# Patient Record
Sex: Male | Born: 1960 | Race: White | Hispanic: No | Marital: Married | Smoking: Never smoker
Health system: Southern US, Community
[De-identification: ages and names within clinical notes are randomized; demographics above are authoritative.]

## PROBLEM LIST (undated history)

## (undated) DIAGNOSIS — R55 Syncope and collapse: Secondary | ICD-10-CM

## (undated) DIAGNOSIS — I471 Supraventricular tachycardia, unspecified: Secondary | ICD-10-CM

## (undated) DIAGNOSIS — H9190 Unspecified hearing loss, unspecified ear: Secondary | ICD-10-CM

## (undated) DIAGNOSIS — K2270 Barrett's esophagus without dysplasia: Secondary | ICD-10-CM

## (undated) DIAGNOSIS — J302 Other seasonal allergic rhinitis: Secondary | ICD-10-CM

## (undated) DIAGNOSIS — K219 Gastro-esophageal reflux disease without esophagitis: Secondary | ICD-10-CM

## (undated) DIAGNOSIS — R042 Hemoptysis: Principal | ICD-10-CM

## (undated) DIAGNOSIS — M94262 Chondromalacia, left knee: Secondary | ICD-10-CM

## (undated) DIAGNOSIS — R12 Heartburn: Secondary | ICD-10-CM

## (undated) DIAGNOSIS — R079 Chest pain, unspecified: Secondary | ICD-10-CM

## (undated) DIAGNOSIS — G459 Transient cerebral ischemic attack, unspecified: Secondary | ICD-10-CM

## (undated) DIAGNOSIS — R002 Palpitations: Secondary | ICD-10-CM

## (undated) DIAGNOSIS — M1990 Unspecified osteoarthritis, unspecified site: Secondary | ICD-10-CM

## (undated) DIAGNOSIS — E7800 Pure hypercholesterolemia, unspecified: Secondary | ICD-10-CM

## (undated) DIAGNOSIS — I639 Cerebral infarction, unspecified: Secondary | ICD-10-CM

## (undated) DIAGNOSIS — T7840XA Allergy, unspecified, initial encounter: Secondary | ICD-10-CM

## (undated) HISTORY — DX: Supraventricular tachycardia: I47.1

## (undated) HISTORY — DX: Chest pain, unspecified: R07.9

## (undated) HISTORY — DX: Cerebral infarction, unspecified: I63.9

## (undated) HISTORY — DX: Palpitations: R00.2

## (undated) HISTORY — DX: Gastro-esophageal reflux disease without esophagitis: K21.9

## (undated) HISTORY — DX: Allergy, unspecified, initial encounter: T78.40XA

## (undated) HISTORY — PX: KNEE ARTHROSCOPY: SHX127

## (undated) HISTORY — DX: Unspecified osteoarthritis, unspecified site: M19.90

## (undated) HISTORY — DX: Hemoptysis: R04.2

## (undated) HISTORY — DX: Supraventricular tachycardia, unspecified: I47.10

## (undated) HISTORY — PX: TRANSTHORACIC ECHOCARDIOGRAM: SHX275

## (undated) HISTORY — DX: Barrett's esophagus without dysplasia: K22.70

## (undated) HISTORY — PX: INGUINAL HERNIA REPAIR: SHX194

---

## 2000-11-14 ENCOUNTER — Emergency Department (HOSPITAL_COMMUNITY): Admission: EM | Admit: 2000-11-14 | Discharge: 2000-11-14 | Payer: Self-pay | Admitting: Emergency Medicine

## 2001-06-29 ENCOUNTER — Emergency Department (HOSPITAL_COMMUNITY): Admission: EM | Admit: 2001-06-29 | Discharge: 2001-06-29 | Payer: Self-pay | Admitting: Emergency Medicine

## 2001-07-08 ENCOUNTER — Emergency Department (HOSPITAL_COMMUNITY): Admission: EM | Admit: 2001-07-08 | Discharge: 2001-07-09 | Payer: Self-pay | Admitting: Emergency Medicine

## 2003-08-09 ENCOUNTER — Emergency Department (HOSPITAL_COMMUNITY): Admission: EM | Admit: 2003-08-09 | Discharge: 2003-08-09 | Payer: Self-pay

## 2004-05-29 ENCOUNTER — Emergency Department (HOSPITAL_COMMUNITY): Admission: EM | Admit: 2004-05-29 | Discharge: 2004-05-30 | Payer: Self-pay | Admitting: Emergency Medicine

## 2004-10-28 ENCOUNTER — Emergency Department (HOSPITAL_COMMUNITY): Admission: EM | Admit: 2004-10-28 | Discharge: 2004-10-28 | Payer: Self-pay | Admitting: Emergency Medicine

## 2005-05-02 ENCOUNTER — Emergency Department (HOSPITAL_COMMUNITY): Admission: EM | Admit: 2005-05-02 | Discharge: 2005-05-02 | Payer: Self-pay | Admitting: Emergency Medicine

## 2005-11-29 ENCOUNTER — Inpatient Hospital Stay (HOSPITAL_COMMUNITY): Admission: EM | Admit: 2005-11-29 | Discharge: 2005-11-30 | Payer: Self-pay | Admitting: Emergency Medicine

## 2006-04-09 ENCOUNTER — Emergency Department (HOSPITAL_COMMUNITY): Admission: EM | Admit: 2006-04-09 | Discharge: 2006-04-09 | Payer: Self-pay | Admitting: Emergency Medicine

## 2006-06-05 ENCOUNTER — Emergency Department (HOSPITAL_COMMUNITY): Admission: EM | Admit: 2006-06-05 | Discharge: 2006-06-05 | Payer: Self-pay | Admitting: Emergency Medicine

## 2006-09-04 ENCOUNTER — Observation Stay (HOSPITAL_COMMUNITY): Admission: EM | Admit: 2006-09-04 | Discharge: 2006-09-04 | Payer: Self-pay | Admitting: Emergency Medicine

## 2006-11-12 ENCOUNTER — Ambulatory Visit: Payer: Self-pay | Admitting: Gastroenterology

## 2006-11-26 ENCOUNTER — Encounter (INDEPENDENT_AMBULATORY_CARE_PROVIDER_SITE_OTHER): Payer: Self-pay | Admitting: Specialist

## 2006-11-26 ENCOUNTER — Ambulatory Visit: Payer: Self-pay | Admitting: Gastroenterology

## 2006-12-08 ENCOUNTER — Ambulatory Visit: Payer: Self-pay | Admitting: Gastroenterology

## 2007-10-29 ENCOUNTER — Ambulatory Visit: Payer: Self-pay | Admitting: Gastroenterology

## 2007-11-10 ENCOUNTER — Encounter: Payer: Self-pay | Admitting: Gastroenterology

## 2007-11-10 ENCOUNTER — Ambulatory Visit: Payer: Self-pay | Admitting: Gastroenterology

## 2007-11-10 ENCOUNTER — Encounter (INDEPENDENT_AMBULATORY_CARE_PROVIDER_SITE_OTHER): Payer: Self-pay | Admitting: *Deleted

## 2008-02-22 ENCOUNTER — Emergency Department (HOSPITAL_COMMUNITY): Admission: EM | Admit: 2008-02-22 | Discharge: 2008-02-22 | Payer: Self-pay | Admitting: Emergency Medicine

## 2008-05-08 ENCOUNTER — Emergency Department (HOSPITAL_COMMUNITY): Admission: EM | Admit: 2008-05-08 | Discharge: 2008-05-08 | Payer: Self-pay | Admitting: Emergency Medicine

## 2009-10-03 ENCOUNTER — Encounter (INDEPENDENT_AMBULATORY_CARE_PROVIDER_SITE_OTHER): Payer: Self-pay | Admitting: *Deleted

## 2009-10-17 ENCOUNTER — Encounter (INDEPENDENT_AMBULATORY_CARE_PROVIDER_SITE_OTHER): Payer: Self-pay | Admitting: *Deleted

## 2009-10-17 ENCOUNTER — Ambulatory Visit: Payer: Self-pay | Admitting: Gastroenterology

## 2009-10-17 DIAGNOSIS — R1013 Epigastric pain: Secondary | ICD-10-CM

## 2009-10-17 DIAGNOSIS — R12 Heartburn: Secondary | ICD-10-CM

## 2009-10-23 ENCOUNTER — Encounter: Payer: Self-pay | Admitting: Gastroenterology

## 2009-10-23 ENCOUNTER — Ambulatory Visit: Payer: Self-pay | Admitting: Gastroenterology

## 2009-10-25 ENCOUNTER — Encounter: Payer: Self-pay | Admitting: Gastroenterology

## 2010-06-24 ENCOUNTER — Emergency Department (HOSPITAL_COMMUNITY): Admission: EM | Admit: 2010-06-24 | Discharge: 2010-06-24 | Payer: Self-pay | Admitting: Emergency Medicine

## 2010-10-04 ENCOUNTER — Emergency Department (HOSPITAL_COMMUNITY)
Admission: EM | Admit: 2010-10-04 | Discharge: 2010-10-04 | Payer: Self-pay | Source: Home / Self Care | Admitting: Emergency Medicine

## 2010-12-30 HISTORY — PX: BUNIONECTOMY: SHX129

## 2010-12-30 HISTORY — PX: OTHER SURGICAL HISTORY: SHX169

## 2011-01-20 ENCOUNTER — Encounter: Payer: Self-pay | Admitting: Gastroenterology

## 2011-02-26 ENCOUNTER — Emergency Department (HOSPITAL_BASED_OUTPATIENT_CLINIC_OR_DEPARTMENT_OTHER)
Admission: EM | Admit: 2011-02-26 | Discharge: 2011-02-26 | Disposition: A | Discharge status: Home / Self Care | Payer: Medicare Other | Attending: Emergency Medicine | Admitting: Emergency Medicine

## 2011-02-26 ENCOUNTER — Emergency Department (INDEPENDENT_AMBULATORY_CARE_PROVIDER_SITE_OTHER): Payer: Medicare Other

## 2011-02-26 DIAGNOSIS — J45909 Unspecified asthma, uncomplicated: Secondary | ICD-10-CM | POA: Insufficient documentation

## 2011-02-26 DIAGNOSIS — H9190 Unspecified hearing loss, unspecified ear: Secondary | ICD-10-CM | POA: Insufficient documentation

## 2011-02-26 DIAGNOSIS — R0682 Tachypnea, not elsewhere classified: Secondary | ICD-10-CM

## 2011-02-26 DIAGNOSIS — R05 Cough: Secondary | ICD-10-CM | POA: Insufficient documentation

## 2011-02-26 DIAGNOSIS — R059 Cough, unspecified: Secondary | ICD-10-CM | POA: Insufficient documentation

## 2011-02-26 DIAGNOSIS — R093 Abnormal sputum: Secondary | ICD-10-CM | POA: Insufficient documentation

## 2011-02-26 DIAGNOSIS — R0602 Shortness of breath: Secondary | ICD-10-CM | POA: Insufficient documentation

## 2011-03-13 LAB — BASIC METABOLIC PANEL
BUN: 16 mg/dL (ref 6–23)
CO2: 27 mEq/L (ref 19–32)
Calcium: 8.7 mg/dL (ref 8.4–10.5)
Chloride: 105 mEq/L (ref 96–112)
Creatinine, Ser: 0.88 mg/dL (ref 0.4–1.5)
GFR calc Af Amer: >60 mL/min (ref >60)
GFR calc non Af Amer: >60 mL/min (ref >60)
Glucose, Bld: 123 mg/dL — ABNORMAL HIGH (ref 70–99)
Potassium: 3.7 mEq/L (ref 3.5–5.1)
Sodium: 138 mEq/L (ref 135–145)

## 2011-03-13 LAB — DIFFERENTIAL
Basophils Absolute: 0 10*3/uL (ref 0.0–0.1)
Basophils Relative: 0 % (ref 0–1)
Eosinophils Absolute: 0.1 10*3/uL (ref 0.0–0.7)
Eosinophils Relative: 1 % (ref 0–5)
Lymphocytes Relative: 14 % (ref 12–46)
Lymphs Abs: 1.4 10*3/uL (ref 0.7–4.0)
Monocytes Absolute: 0.7 10*3/uL (ref 0.1–1.0)
Monocytes Relative: 7 % (ref 3–12)
Neutro Abs: 7.9 10*3/uL — ABNORMAL HIGH (ref 1.7–7.7)
Neutrophils Relative %: 78 % — ABNORMAL HIGH (ref 43–77)

## 2011-03-13 LAB — CBC
HCT: 40.6 % (ref 39.0–52.0)
Hemoglobin: 13.7 g/dL (ref 13.0–17.0)
MCH: 30.9 pg (ref 26.0–34.0)
MCHC: 33.7 g/dL (ref 30.0–36.0)
MCV: 91.6 fL (ref 78.0–100.0)
Platelets: 200 K/uL (ref 150–400)
RBC: 4.43 MIL/uL (ref 4.22–5.81)
RDW: 12.8 % (ref 11.5–15.5)
WBC: 10.2 K/uL (ref 4.0–10.5)

## 2011-03-13 LAB — POCT CARDIAC MARKERS
CKMB, poc: <1 ng/mL — ABNORMAL LOW (ref 1.0–8.0)
Myoglobin, poc: 36.3 ng/mL (ref 12–200)
Troponin i, poc: <0.05 ng/mL (ref 0.00–0.09)

## 2011-03-13 LAB — GLUCOSE, CAPILLARY: Glucose-Capillary: 139 mg/dL — ABNORMAL HIGH (ref 70–99)

## 2011-03-19 ENCOUNTER — Emergency Department (HOSPITAL_COMMUNITY)
Admission: EM | Admit: 2011-03-19 | Discharge: 2011-03-19 | Disposition: A | Discharge status: Home / Self Care | Payer: Medicare Other | Attending: Emergency Medicine | Admitting: Emergency Medicine

## 2011-03-19 ENCOUNTER — Emergency Department (HOSPITAL_COMMUNITY): Payer: Medicare Other

## 2011-03-19 DIAGNOSIS — R209 Unspecified disturbances of skin sensation: Secondary | ICD-10-CM | POA: Insufficient documentation

## 2011-03-19 DIAGNOSIS — Z79899 Other long term (current) drug therapy: Secondary | ICD-10-CM | POA: Insufficient documentation

## 2011-03-19 DIAGNOSIS — R059 Cough, unspecified: Secondary | ICD-10-CM | POA: Insufficient documentation

## 2011-03-19 DIAGNOSIS — R079 Chest pain, unspecified: Secondary | ICD-10-CM | POA: Insufficient documentation

## 2011-03-19 DIAGNOSIS — M79609 Pain in unspecified limb: Secondary | ICD-10-CM | POA: Insufficient documentation

## 2011-03-19 DIAGNOSIS — W19XXXA Unspecified fall, initial encounter: Secondary | ICD-10-CM | POA: Insufficient documentation

## 2011-03-19 DIAGNOSIS — H9190 Unspecified hearing loss, unspecified ear: Secondary | ICD-10-CM | POA: Insufficient documentation

## 2011-03-19 DIAGNOSIS — Z7982 Long term (current) use of aspirin: Secondary | ICD-10-CM | POA: Insufficient documentation

## 2011-03-19 DIAGNOSIS — M25559 Pain in unspecified hip: Secondary | ICD-10-CM | POA: Insufficient documentation

## 2011-03-19 DIAGNOSIS — J45909 Unspecified asthma, uncomplicated: Secondary | ICD-10-CM | POA: Insufficient documentation

## 2011-03-19 DIAGNOSIS — R05 Cough: Secondary | ICD-10-CM | POA: Insufficient documentation

## 2011-03-19 DIAGNOSIS — S7010XA Contusion of unspecified thigh, initial encounter: Secondary | ICD-10-CM | POA: Insufficient documentation

## 2011-03-19 LAB — CBC
HCT: 39.8 % (ref 39.0–52.0)
Hemoglobin: 13.4 g/dL (ref 13.0–17.0)
MCH: 29.4 pg (ref 26.0–34.0)
MCHC: 33.7 g/dL (ref 30.0–36.0)
MCV: 87.3 fL (ref 78.0–100.0)
Platelets: 211 10*3/uL (ref 150–400)
RBC: 4.56 MIL/uL (ref 4.22–5.81)
RDW: 12.6 % (ref 11.5–15.5)
WBC: 6.4 10*3/uL (ref 4.0–10.5)

## 2011-03-19 LAB — DIFFERENTIAL
Basophils Absolute: 0 10*3/uL (ref 0.0–0.1)
Basophils Relative: 1 % (ref 0–1)
Eosinophils Absolute: 0.2 10*3/uL (ref 0.0–0.7)
Eosinophils Relative: 2 % (ref 0–5)
Lymphocytes Relative: 25 % (ref 12–46)
Lymphs Abs: 1.6 10*3/uL (ref 0.7–4.0)
Monocytes Absolute: 0.5 10*3/uL (ref 0.1–1.0)
Monocytes Relative: 7 % (ref 3–12)
Neutro Abs: 4.1 10*3/uL (ref 1.7–7.7)
Neutrophils Relative %: 65 % (ref 43–77)

## 2011-03-19 LAB — BASIC METABOLIC PANEL
BUN: 11 mg/dL (ref 6–23)
CO2: 27 mEq/L (ref 19–32)
Calcium: 9.1 mg/dL (ref 8.4–10.5)
Chloride: 105 mEq/L (ref 96–112)
Creatinine, Ser: 0.82 mg/dL (ref 0.4–1.5)
GFR calc Af Amer: >60 mL/min (ref >60)
GFR calc non Af Amer: >60 mL/min (ref >60)
Glucose, Bld: 125 mg/dL — ABNORMAL HIGH (ref 70–99)
Potassium: 3.7 mEq/L (ref 3.5–5.1)
Sodium: 140 mEq/L (ref 135–145)

## 2011-03-19 LAB — SEDIMENTATION RATE: Sed Rate: 5 mm/hr (ref 0–16)

## 2011-04-07 ENCOUNTER — Emergency Department (HOSPITAL_BASED_OUTPATIENT_CLINIC_OR_DEPARTMENT_OTHER)
Admission: EM | Admit: 2011-04-07 | Discharge: 2011-04-07 | Disposition: A | Discharge status: Home / Self Care | Payer: Medicare Other | Attending: Emergency Medicine | Admitting: Emergency Medicine

## 2011-04-07 DIAGNOSIS — R0602 Shortness of breath: Secondary | ICD-10-CM | POA: Insufficient documentation

## 2011-04-07 DIAGNOSIS — J45909 Unspecified asthma, uncomplicated: Secondary | ICD-10-CM | POA: Insufficient documentation

## 2011-05-17 NOTE — Assessment & Plan Note (Signed)
[facility]                         GASTROENTEROLOGY OFFICE NOTE   NAME:[name], Nathaniel                    MRN:          [id number]  DATE:12/08/2006                            DOB:          Jan 14, 1961    PRIMARY CARE PHYSICIAN:  Dr. Louanna Raw.   GI PROBLEM LIST:  1. Chronic GERD. Pyrosis stabbing chest discomforts. Improved while on      PPI. Also improved with dietary modifications (quit drinking 4      coffees daily). EGD November 2007 no esophagitis. Non-nodular 1.5      cm single tongue of Barrett's esophagus. No dysplasia seen on      biopsies.  2. Barrett's esophagus. November 2007 EGD with single tongue of salmon-      colored mucosa short segment. Biopsies showed no dysplasia.   INTERVAL HISTORY:  I last saw [name] at the time of his EGD. He  returned today with a translator to discuss the findings and discuss  surveillance for Barrett's esophagus. He has modified his diet with the  aid of his wife and even off PPI has zero GERD symptoms now.   CURRENT MEDICATIONS:  Singulair, Allegra, Lipitor and aspirin.   PHYSICAL EXAMINATION:  VITAL SIGNS:  Weight 161 pounds, blood pressure  110/80, pulse 72.  CONSTITUTIONAL:  Generally well-appearing.  ABDOMEN:  Soft, nontender, nondistended, normal bowel sounds.   ASSESSMENT/PLAN:  A 50 year old man with gastroesophageal reflux  disease, non-dysplastic Barrett's.   I gave him a patient handout printed from up-to-date about Barrett's  esophagus diagnosis and monitoring. He is definitely improved from a  pyrosis standpoint by simply avoiding certain foods; however, I  recommended in light of the fact that he does have Barrett's that he  stay on a proton pump inhibitor once daily. I gave him a prescription  for Omeprazole 20 mg to be taken 20-30 minutes before of his first meal  of the day. He will return for EGD in one year for Barrett's  surveillance. If there is no dysplasia at that  exam then we can space  these surveillance out 2-3 years. He does understand this. He knows to  give [place] a call if he has any further questions or concerns.     Rachael Fee, MD  Electronically Signed    DPJ/MedQ  DD: 12/08/2006  DT: 12/08/2006  Job #: [id number]   cc:   Louanna Raw

## 2011-05-17 NOTE — H&P (Signed)
Nathaniel [name], Nathaniel [name]             ACCOUNT NO.:  [id number]   MEDICAL RECORD NO.:  [id number]          PATIENT TYPE:  EMS   LOCATION:  MAJO                         FACILITY:  [facility]   PHYSICIAN:  Cristy Hilts. Jacinto Halim, MD       DATE OF BIRTH:  05/05/1961   DATE OF ADMISSION:  11/29/2005  DATE OF DISCHARGE:                                HISTORY & PHYSICAL   CHIEF COMPLAINT:  Chest pain and palpitations.   HISTORY OF PRESENT ILLNESS:  The patient is a 50 year old male with no prior  history of coronary artery disease or myocardial infarction.  He was  actually supposed to see Dr. Cristy Hilts. Ganji in the past for chest pain, but  did not make the appointment.  He is admitted now through the emergency room  with complaints of palpitations and weakness this morning while bathing.  Now he has left-sided chest pain which is pleuritic.  He denies any nausea  or diaphoresis.  He has no history of any similar chest pain.  He did have a  nitroglycerin en route, which helped.   PAST MEDICAL/SURGICAL HISTORY:  1.  Asthma.  2.  He had left foot surgery in June 2005.  3.  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   MEDICATIONS AT HOME:  1.  Allegra-D.  2.  Singulair 10 mg daily.   ALLERGIES:  No known drug allergies.   SOCIAL HISTORY:  He is married.  He works as a Gaffer.  He never smoked.  He has no natural children.  He has one stepson.   FAMILY HISTORY:  Remarkable in that his father died at age 28, of a  myocardial infarction.   REVIEW OF SYSTEMS:  Essentially unremarkable except as noted above.   PHYSICAL EXAMINATION:  VITAL SIGNS:  Blood pressure 120/72, pulse 56,  temperature 98.5 degrees.  O2 is 96% on room air.  GENERAL:  He is a well-developed and well-nourished man, in no acute  distress.  HEENT:  Normocephalic.  Extraocular eye movements are intact.  Throat is  anicteric.  He does wear glasses.  NECK:  Without bruit, without jugular venous distention.  CHEST:  Clear to  auscultation and percussion.  HEART:  Reveals a regular rate and rhythm without murmur, rub or gallop.  Normal S1 and S2.  ABDOMEN:  Nontender.  No hepatosplenomegaly.  EXTREMITIES:  Without edema.  Distal pulses are 2+/4.  NEUROLOGIC:  Grossly intact.  SKIN:  Warm and dry.   LABORATORY DATA:  Hematology profile is normal, as is his renal function.  His troponin is negative x1.   Electrocardiogram shows a sinus rhythm without acute changes.   IMPRESSION:  1.  Chest pain, rule out cardiac.  2.  Recent palpitations, possibility secondary to Allegra-D.  3.  Asthma.  4.  deaf.   PLAN:  He will admitted for a 24-hour observation.  If he rules out for a  myocardial infarction, he can probably be set up for an outpatient  echocardiogram and Cardiolite study.  Will go ahead and add aspirin, PPI and  low-dose beta blocker  and Lovenox.  Will stop his Allegra-D for now.      Abelino Derrick, P.A.      Cristy Hilts. Jacinto Halim, MD  Electronically Signed    LKK/MEDQ  D:  11/29/2005  T:  11/29/2005  Job:  [id number]

## 2011-05-17 NOTE — H&P (Signed)
NAMETAVARES, LEVINSON             ACCOUNT NO.:  [id number]   MEDICAL RECORD NO.:  [id number]          PATIENT TYPE:  INP   LOCATION:  2029                         FACILITY:  [facility]   PHYSICIAN:  Cristy Hilts. Jacinto Halim, MD       DATE OF BIRTH:  [birth year]   DATE OF ADMISSION:  09/04/2006  DATE OF DISCHARGE:                                HISTORY & PHYSICAL   HISTORY OF PRESENT ILLNESS:  Nathaniel [name] is a 50 year old white man who  is admitted to [facility] for further evaluation of chest pain.  The patient has previously been evaluated for chest pain via noninvasive  means, but has not been found to have coronary artery disease.   The patient presented to the emergency department after experiencing an  episode of chest pain while he was walking the dog.  The chest pain was  described as a sharp discomfort in the left anterior chest.  It radiated to  the left shoulder.  It was not associated with dyspnea, diaphoresis, or  nausea.  There were no exacerbating or ameliorating factors.  It appeared  not to be related to position, meals, or respirations.  It was ultimately  relieved within several minutes of being given 1 nitroglycerin tablet in the  emergency department.  The total duration of chest pain was approximately 3  hours.  He is free of chest pain and otherwise asymptomatic at this time.   As noted, the patient has no history of cardiac disease, including no  history of coronary artery disease, myocardial infarction, congestive heart  failure, or arrhythmia.  He has no risk factors for coronary artery disease  including no history of hypertension, dyslipidemia, diabetes mellitus,  smoking, or family history of coronary artery disease.   MEDICATIONS:  None.   ALLERGIES:  None.   OPERATIONS:  Operations on his left hand and his feet.   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  He  lives with his wife.  He does not smoke cigarettes.  He does  not drink  alcohol.   The patient is deaf and he communicates via sign language.  This history was  obtained with the assistance of a sign language interpreter.   OTHER MEDICAL PROBLEMS:  None.   FAMILY HISTORY:  There is no family history of coronary artery disease.   REVIEW OF SYSTEMS:  Review of systems reveals no new problems related to his  head, eyes, ears, nose, mouth, throat, lungs, gastrointestinal system,  genitourinary system, or extremities.  There is no history of neurologic or  psychiatric disorder.  There is no history of fever, chills or weight loss.   PHYSICAL EXAMINATION:  VITAL SIGNS:  Blood pressure 120/73.  Pulse 82 and  regular.  Respirations 22.  Temperature 98.6.  Pulse oximetry 97% on room  air.  GENERAL:  The patient was a middle-aged white man in no discomfort.  He was  alert, oriented, and responsive (via sign language).  HEENT:  Head, eyes, nose, and mouth were normal.  NECK:  The neck was without thyromegaly  or adenopathy.  Carotid pulses were  palpable bilaterally and without bruits.  CARDIAC:  Examination revealed a normal S1 and S2.  There was no S3, S4,  murmur, rub, or click.  Cardiac rhythm was regular.  No chest wall  tenderness was noted.  LUNGS:  The lungs were clear.  ABDOMEN:  The abdomen was soft and nontender.  There was no mass,  hepatosplenomegaly, bruit, distention, rebound, guarding, or rigidity.  Bowel sounds were normal.  RECTAL AND GENITAL:  Examinations were not performed as they were not  pertinent to the reason for acute care hospitalization.  EXTREMITIES:  The extremities were without edema, deviation, or deformity.  Radial and dorsalis pedal pulses were palpable bilaterally.  NEUROLOGIC:  Brief screening neurologic survey was unremarkable.   LABORATORY AND ACCESSORY CLINICAL DATA:  The electrocardiogram was normal.   The chest radiograph had not yet been formally interpreted, but demonstrated  no evidence of acute  cardiopulmonary disease by my interpretation.   The initial set of cardiac markers revealed a myoglobin of 56.5, CK-MB less  than 1.0, and troponin less than 0.05.  Potassium was 3.5, BUN 21, and  creatinine 1.1.  The remaining studies were pending at the time of this  dictation.   IMPRESSION:  Chest pain; rule out coronary artery disease.   PLAN:  1. Telemetry.  2. Serial cardiac enzymes.  3. Aspirin.  4. Intravenous nitroglycerin.  5. Intravenous heparin.  6. Fasting lipid profile.  7. Further measures per Dr. Jacinto Halim.      Ulyses Amor, MD   Electronically Signed     ______________________________  Cristy Hilts. Jacinto Halim, MD    MSC/MEDQ  D:  09/04/2006  T:  09/04/2006  Job:  [id number]

## 2011-05-17 NOTE — Assessment & Plan Note (Signed)
[facility]                           GASTROENTEROLOGY OFFICE NOTE   NAME:TRIPLETTDarvell, Monteforte                    MRN:          [id number]  DATE:11/12/2006                            DOB:          [birth year]    REFERRING PHYSICIAN:  Louanna Raw   REASON FOR REFERRAL:  Dr. Earlene Plater asked me to evaluate Mr. Scull in  consultation regarding intermittent non-cardiac chest pains.   HISTORY OF PRESENT ILLNESS:  Mr. Kutner is a very pleasant 50 year old  deaf man, who is here with a translator, who has had intermittent burning,  stabbing chest discomforts for at least a year and a half. He tells me had  an angiogram with Dr. Jacinto Halim in 2006, and was told his heart is completely  normal and that his symptoms were unlikely to be from his heart. His pains  are a burning, somewhat stabbing pain that has occurred several times in the  past few months. It does not seem to be related to eating necessarily, nor  does it seem to be related to exercise. He says the most reliable cause of  this is emotionally stressful situations such as driving his car, being  caught in traffic and then he tends to feel the discomfort.   He also describes kind of an acid, burning taste in his mouth. He has never  tried H2 blockers or PPIs from what I can tell. He has intermittent mild  solid food dysphagia that is non-progressive.   REVIEW OF SYSTEMS:  Notable for 10-15 pound weight loss in the past 6  months. He believes this is because he is just simply eating less because he  does not have much money and cannot afford food.   CURRENT MEDICATIONS:  Singulair, Allegra, aspirin, Lipitor.   SOCIAL HISTORY:  Married. Lives with his wife. Has no children. Non-smoker  and non-drinker. Drinks 3-4 coffees a day with a soda 2-3 times a week. Does  not eat much chocolate, peppermint. Goes to bed probably 3-4 hours after  eating his dinner meal.   FAMILY HISTORY:  No colon cancer or  colon polyps in family.   PHYSICAL EXAMINATION:  VITAL SIGNS:  Height is 5 feet, 10 inches, 157  pounds, blood pressure is 112/70, pulse is 90.  CONSTITUTIONAL: Well-appearing deaf man who is here being assisted by a  Nurse, learning disability.  EYES: Extraocular movements intact.  MOUTH: Oropharynx moist. No lesions.  NECK: Supple. No lymphadenopathy.  CARDIOVASCULAR: Heart regular rate and rhythm.  LUNGS:  Clear to auscultation bilaterally.  ABDOMEN: Soft and nontender, nondistended. Normal bowel sounds.  EXTREMITIES: No lower extremity edema.  SKIN: No rashes or lesions on visible extremities.   ASSESSMENT/PLAN:  A 50 year old man with atypical chest discomforts, acid  taste in his mouth.   His symptoms may indeed be from gastroesophageal reflux disease. He has  never tried H2 blockers, nor PPIs and I will therefore begin him on  omeprazole 20 mg once daily. He knows to take 20-30 minutes prior to his  first meal of the day. He does have some weight loss over the past 6 months,  but  he tells me that this is because he is just not able to afford his food,  so I am not sure if I consider this really true alarm symptom. He has no  anemia based on his CBC done earlier this month. His hemoglobin was normal.  I think we should, however, proceed with endoscopy at his soonest  convenience given that these likely gastroesophageal reflux disease symptoms  have been going on for at least 1-2 years now and perhaps longer.     Rachael Fee, MD  Electronically Signed    DPJ/MedQ  DD: 11/12/2006  DT: 11/12/2006  Job #: [id number]   cc:   Louanna Raw

## 2011-06-23 ENCOUNTER — Emergency Department (HOSPITAL_COMMUNITY): Payer: Medicare Other

## 2011-06-23 ENCOUNTER — Observation Stay (HOSPITAL_COMMUNITY)
Admission: EM | Admit: 2011-06-23 | Discharge: 2011-06-24 | Disposition: A | Discharge status: Home / Self Care | Payer: Medicare Other | Source: Ambulatory Visit | Attending: Cardiovascular Disease | Admitting: Cardiovascular Disease

## 2011-06-23 DIAGNOSIS — Z79899 Other long term (current) drug therapy: Secondary | ICD-10-CM | POA: Insufficient documentation

## 2011-06-23 DIAGNOSIS — R0609 Other forms of dyspnea: Secondary | ICD-10-CM | POA: Insufficient documentation

## 2011-06-23 DIAGNOSIS — E785 Hyperlipidemia, unspecified: Secondary | ICD-10-CM | POA: Insufficient documentation

## 2011-06-23 DIAGNOSIS — H9190 Unspecified hearing loss, unspecified ear: Secondary | ICD-10-CM | POA: Insufficient documentation

## 2011-06-23 DIAGNOSIS — Z01810 Encounter for preprocedural cardiovascular examination: Secondary | ICD-10-CM | POA: Insufficient documentation

## 2011-06-23 DIAGNOSIS — K2270 Barrett's esophagus without dysplasia: Secondary | ICD-10-CM | POA: Insufficient documentation

## 2011-06-23 DIAGNOSIS — Z01812 Encounter for preprocedural laboratory examination: Secondary | ICD-10-CM | POA: Insufficient documentation

## 2011-06-23 DIAGNOSIS — R0989 Other specified symptoms and signs involving the circulatory and respiratory systems: Secondary | ICD-10-CM | POA: Insufficient documentation

## 2011-06-23 DIAGNOSIS — Z01818 Encounter for other preprocedural examination: Secondary | ICD-10-CM | POA: Insufficient documentation

## 2011-06-23 DIAGNOSIS — R0789 Other chest pain: Principal | ICD-10-CM | POA: Insufficient documentation

## 2011-06-23 LAB — CK TOTAL AND CKMB (NOT AT ARMC)
CK, MB: 1.8 ng/mL (ref 0.3–4.0)
Relative Index: INVALID (ref 0.0–2.5)

## 2011-06-23 LAB — DIFFERENTIAL
Basophils Absolute: 0 10*3/uL (ref 0.0–0.1)
Lymphocytes Relative: 28 % (ref 12–46)
Monocytes Absolute: 0.8 10*3/uL (ref 0.1–1.0)
Neutro Abs: 5.2 10*3/uL (ref 1.7–7.7)
Neutrophils Relative %: 61 % (ref 43–77)

## 2011-06-23 LAB — CBC
HCT: 38.1 % — ABNORMAL LOW (ref 39.0–52.0)
Hemoglobin: 13.3 g/dL (ref 13.0–17.0)
RBC: 4.43 MIL/uL (ref 4.22–5.81)
WBC: 8.5 10*3/uL (ref 4.0–10.5)

## 2011-06-23 LAB — TROPONIN I: Troponin I: <0.3 ng/mL (ref <0.30)

## 2011-06-24 HISTORY — PX: CARDIAC CATHETERIZATION: SHX172

## 2011-06-24 LAB — BASIC METABOLIC PANEL
BUN: 13 mg/dL (ref 6–23)
Chloride: 107 mEq/L (ref 96–112)
Creatinine, Ser: 0.65 mg/dL (ref 0.50–1.35)
GFR calc Af Amer: >60 mL/min (ref >60)
GFR calc non Af Amer: >60 mL/min (ref >60)
Glucose, Bld: 109 mg/dL — ABNORMAL HIGH (ref 70–99)

## 2011-06-24 LAB — CARDIAC PANEL(CRET KIN+CKTOT+MB+TROPI)
CK, MB: 1.5 ng/mL (ref 0.3–4.0)
CK, MB: 1.5 ng/mL (ref 0.3–4.0)
Troponin I: <0.3 ng/mL (ref <0.30)

## 2011-06-24 LAB — CBC
MCV: 87.3 fL (ref 78.0–100.0)
Platelets: 190 10*3/uL (ref 150–400)
RBC: 4.73 MIL/uL (ref 4.22–5.81)
RDW: 13.4 % (ref 11.5–15.5)
WBC: 6.4 10*3/uL (ref 4.0–10.5)

## 2011-06-24 LAB — POCT I-STAT, CHEM 8
BUN: 15 mg/dL (ref 6–23)
Chloride: 105 mEq/L (ref 96–112)
Creatinine, Ser: 0.8 mg/dL (ref 0.50–1.35)
Glucose, Bld: 100 mg/dL — ABNORMAL HIGH (ref 70–99)
Potassium: 3.6 mEq/L (ref 3.5–5.1)

## 2011-06-24 LAB — PROTIME-INR
INR: 1 (ref 0.00–1.49)
Prothrombin Time: 13.4 seconds (ref 11.6–15.2)

## 2011-06-24 LAB — TSH: TSH: 1.993 u[IU]/mL (ref 0.350–4.500)

## 2011-06-24 LAB — POCT ACTIVATED CLOTTING TIME: Activated Clotting Time: 100 seconds

## 2011-06-24 LAB — HEMOGLOBIN A1C
Hgb A1c MFr Bld: 5.9 % — ABNORMAL HIGH (ref <5.7)
Mean Plasma Glucose: 123 mg/dL — ABNORMAL HIGH (ref <117)

## 2011-06-24 LAB — LIPID PANEL
LDL Cholesterol: 107 mg/dL — ABNORMAL HIGH (ref 0–99)
Total CHOL/HDL Ratio: 4.1 RATIO

## 2011-07-01 NOTE — Discharge Summary (Signed)
Nathaniel [name], Nathaniel [name] NO.:  [id number]  MEDICAL RECORD NO.:  [id number]  LOCATION:  3714                         FACILITY:  [facility]  PHYSICIAN:  Nicki Guadalajara, M.D.     DATE OF BIRTH:  11-06-1961  DATE OF ADMISSION:  06/23/2011 DATE OF DISCHARGE:  06/24/2011                              DISCHARGE SUMMARY   DISCHARGE DIAGNOSES: 1. Chest pain, atypical, status post left heart catheterization, June 24, 2011, normal coronary arteries and normal left ventricular     function.  No significant coronary artery disease. 2. History of dyslipidemia. 3. The patient is deaf. 4. History of Barrett esophagus. 5. History of vasovagal syncope.  HOSPITAL COURSE:  Nathaniel [name] is a 50 year old Caucasian male with a history of chronic deafness, chronic chest pain syndrome, dyslipidemia, Barrett esophagus, and vasovagal syncope.  He had a recent Persantine Myoview on April 11, 2011 which showed no reversible ischemia and ejection fraction of 52%.  He also had wore Holter monitor from April 17 through April 17, 2011 which showed sinus tachycardia, positive PACs and PVCs.  He also had lower extremity abdominal aortic Dopplers which showed normal tapering.  Upper extremity Dopplers previously were normal.  He presented with chest pain that was a knife standing like, 6/10, and pretty much doing well after 8:00 p.m. yesterday.  Also, the chest pain on Saturday improved after taking aspirin, then redeveloped on Sunday at approximately 8:00 p.m. while he was at work.  He also became very flushed with right arm numbness as well.  He stated he felt like his heart would hold and get tightened.  He has reported no dizziness and shortness of breath.  He felt extremely tired afterwards. He was admitted to rule out ACS.  Nathaniel [name] was admitted to Telemetry.  We monitored his heart rate, checked TSH, lipids, A1c, started metoprolol 12.5 mg p.o. b.i.d.  He was also given Toradol 15 mg q.6  h. p.r.n.  Cardiac enzymes were cycled x3 with an EKG.  EKG upon admission showed no acute changes with a heart rate of 64.  He was also started on IV heparin.  On the morning of June 24, 2011, the patient had no further chest pain.  It was decided to further delineate and rule out any coronary artery disease issues with his left heart cath.  This was discussed with the patient and his wife through use of an interpreter. Left heart cath showed normal LV function and no significant coronary artery disease.  At this time, the patient is stable and he will be discharged home after appropriate bedrest that has expired which is approximately 1830 hours night.  DISCHARGE LABORATORY DATA:  WBCs 6.4, hemoglobin 14.0, hematocrit 41.3, and platelets 190.  Sodium 140, potassium 3.6, chloride 107, carbon dioxide 26, BUN 13, creatinine 0.65, glucose 109, and calcium 8.1.  CK- MB negative x2 and troponin negative x2.  Total cholesterol 157, triglycerides 58, HDL 38, LDL 107, VLDL 42, and ratio of 4.1. Hemoglobin A1c 5.9.  TSH of 1.993.  Cardiac enzymes were negative x3.  STUDIES/PROCEDURES: 1. Chest x-ray, June 23, 2011 shows the heart and mediastinal contours     within  normal limits.  No focal opacities or effusions.  No acute     bony abnormalities.  There is no active cardiopulmonary process. 2. Left heart catheterization, June 24, 2011.  The patient had normal     LV function and no significant coronary disease.  DISCHARGE MEDICATIONS: 1. Ambien 5 mg 1 tablet by mouth at bedtime p.r.n. 2. Acetaminophen 325 mg 1-2 tablets by mouth every 4 hours as needed     for pain.  DISPOSITION:  Nathaniel [name] is discharged home in stable condition. Recommended to increase his activity slowly and eat a heart-healthy diet.  No lifting greater than 5 pounds for the next 2 days and he should return to work on Friday, June 28, 2011.  If the cath site becomes red, painful, swollen, or discharge fluid or pus, he  is to call our office immediately.  He will follow with Dr. Herbie Baltimore and our office will call him with the appointment time.    ______________________________ Wilburt Finlay, PA   ______________________________ Nicki Guadalajara, M.D.    BH/MEDQ  D:  06/24/2011  T:  06/25/2011  Job:  [id number]  cc:   Landry Corporal, MD  Electronically Signed by Wilburt Finlay PA on 06/27/2011 03:07:23 PM Electronically Signed by Nicki Guadalajara M.D. on 07/01/2011 04:29:58 PM

## 2011-07-01 NOTE — Cardiovascular Report (Signed)
  NAMEDARY, DILAURO NO.:  [id number]  MEDICAL RECORD NO.:  [id number]  LOCATION:  3714                         FACILITY:  [facility]  PHYSICIAN:  Nicki Guadalajara, M.D.     DATE OF BIRTH:  [birth year]  DATE OF PROCEDURE:  06/24/2011 DATE OF DISCHARGE:  06/24/2011                           CARDIAC CATHETERIZATION   INDICATIONS:  Mr. Nathaniel [name] is a 50 year old gentleman who has a history of chronic deafness, recurrent chest pain symptomatology, history of hyperlipidemia, Barrett esophagus, vasovagal syncope.  He apparently had undergone cardiac catheterization in 2006 which showed essentially normal coronary arteries.  The patient has been evaluated recently by Dr. Herbie Baltimore with episodes of recurrent chest pain.  He has undergone numerous noninvasive evaluations including percent Myoview, Holter monitor, Doppler's of aortic arch.  He was admitted last evening to [facility] with recurrent chest pain.  Due to recurrent symptomatology, definitive repeat cardiac catheterization was recommended.  PROCEDURE:  Study was done with a sign language interpreter in the room who was able to communicate with the patient in light of his deafness. The patient was prepped and draped in usual fashion.  His right femoral artery was punctured anteriorly and a 5-French sheath was inserted without difficulty.  Diagnostic catheterization was done utilizing 5- [place] Judkins for left and right coronary catheters.  A 5-French pigtail catheter was used for left ventriculography.  Distal aortography was also performed to make certain he did not have any significant aortoiliac disease.  Hemostasis was obtained by direct manual pressure. The patient tolerated the procedure well.  HEMODYNAMIC DATA:  Central aortic pressure was 110/70.  Left ventricle pressure 110/12.  ANGIOGRAPHIC DATA:  The left main coronary artery was angiographically normal, bifurcated into an LAD and left  circumflex system.  The LAD gave rise to a large proximal diagonal vessel which was almost a twin-like LAD system.  Both the diagonal and the LAD almost reached the apex.  The distal aspect of these vessels was small.  There was no significant obstructive disease.  The left circumflex coronary artery was angiographically normal, gave rise to 2 proximal small marginal vessels and a larger third marginal vessel.  The right coronary artery was a large-caliber dominant vessel that supplied a large PDA system and small inferior LV and posterolateral branch.  RAO ventriculography revealed normal LV contractility without focal segmental wall motion abnormalities.  Distal aortography did not demonstrate any significant aortoiliac disease.  There is no evidence for renal artery stenosis.  IMPRESSION: 1. Normal left ventricular function. 2. Essentially normal coronary arteries without obstructive disease.          ______________________________ Nicki Guadalajara, M.D.     TK/MEDQ  D:  06/24/2011  T:  06/25/2011  Job:  [id number]  cc:   Landry Corporal, MD  Electronically Signed by Nicki Guadalajara M.D. on 07/01/2011 04:29:53 PM

## 2011-09-20 LAB — CBC
Hemoglobin: 14.6
MCHC: 33.9
RBC: 4.81
WBC: 6.6

## 2011-09-20 LAB — PROTIME-INR: INR: 0.9

## 2011-09-20 LAB — POCT I-STAT CREATININE
Creatinine, Ser: 1.2
Operator id: 285841

## 2011-09-20 LAB — I-STAT 8, (EC8 V) (CONVERTED LAB)
Acid-base deficit: 1
Chloride: 106
Glucose, Bld: 90
Potassium: 4
TCO2: 25
pCO2, Ven: 38.1 — ABNORMAL LOW
pH, Ven: 7.396 — ABNORMAL HIGH

## 2011-09-20 LAB — APTT: aPTT: 29

## 2011-09-20 LAB — DIFFERENTIAL
Basophils Relative: 1
Lymphocytes Relative: 29
Lymphs Abs: 1.9
Monocytes Absolute: 0.4
Monocytes Relative: 7
Neutro Abs: 4
Neutrophils Relative %: 61

## 2011-09-20 LAB — POCT CARDIAC MARKERS
CKMB, poc: <1 — ABNORMAL LOW
Operator id: 146091
Operator id: 285841
Troponin i, poc: <0.05
Troponin i, poc: <0.05

## 2011-12-31 DIAGNOSIS — I639 Cerebral infarction, unspecified: Secondary | ICD-10-CM

## 2011-12-31 HISTORY — PX: UPPER GASTROINTESTINAL ENDOSCOPY: SHX188

## 2011-12-31 HISTORY — DX: Cerebral infarction, unspecified: I63.9

## 2012-01-26 ENCOUNTER — Emergency Department (HOSPITAL_BASED_OUTPATIENT_CLINIC_OR_DEPARTMENT_OTHER)
Admission: EM | Admit: 2012-01-26 | Discharge: 2012-01-26 | Disposition: A | Discharge status: Home / Self Care | Payer: Medicare Other | Attending: Emergency Medicine | Admitting: Emergency Medicine

## 2012-01-26 ENCOUNTER — Encounter (HOSPITAL_BASED_OUTPATIENT_CLINIC_OR_DEPARTMENT_OTHER): Payer: Self-pay | Admitting: *Deleted

## 2012-01-26 DIAGNOSIS — R0602 Shortness of breath: Secondary | ICD-10-CM | POA: Insufficient documentation

## 2012-01-26 DIAGNOSIS — J45909 Unspecified asthma, uncomplicated: Secondary | ICD-10-CM | POA: Insufficient documentation

## 2012-01-26 DIAGNOSIS — R05 Cough: Secondary | ICD-10-CM | POA: Insufficient documentation

## 2012-01-26 DIAGNOSIS — R059 Cough, unspecified: Secondary | ICD-10-CM | POA: Insufficient documentation

## 2012-01-26 MED ORDER — ALBUTEROL SULFATE (5 MG/ML) 0.5% IN NEBU
INHALATION_SOLUTION | RESPIRATORY_TRACT | Status: AC
  Administered 2012-01-26: 5 mg via RESPIRATORY_TRACT
  Filled 2012-01-26: qty 1

## 2012-01-26 MED ORDER — IPRATROPIUM BROMIDE 0.02 % IN SOLN
RESPIRATORY_TRACT | Status: AC
  Administered 2012-01-26: 0.5 mg via RESPIRATORY_TRACT
  Filled 2012-01-26: qty 2.5

## 2012-01-26 MED ORDER — IPRATROPIUM BROMIDE 0.02 % IN SOLN
0.5000 mg | Freq: Once | RESPIRATORY_TRACT | Status: AC
  Administered 2012-01-26: 0.5 mg via RESPIRATORY_TRACT

## 2012-01-26 MED ORDER — ALBUTEROL SULFATE (5 MG/ML) 0.5% IN NEBU
5.0000 mg | INHALATION_SOLUTION | Freq: Once | RESPIRATORY_TRACT | Status: AC
  Administered 2012-01-26: 5 mg via RESPIRATORY_TRACT

## 2012-01-26 NOTE — ED Notes (Signed)
Patient ambulating to restroom w/o difficulty, drinking coke

## 2012-01-26 NOTE — ED Provider Notes (Signed)
Medical screening examination/treatment/procedure(s) were performed by non-physician practitioner and as supervising physician I was immediately available for consultation/collaboration.   Gwyneth Sprout, MD 01/26/12 1538

## 2012-01-26 NOTE — ED Notes (Signed)
Informed patient that we will let him stay a little longer to ensure he has no  breathing difficulties and if not he will be discharged, drinking coke and eating ice cream

## 2012-01-26 NOTE — ED Notes (Signed)
Pt deaf- reports used his inhaler this morning without relief- productive cough

## 2012-01-26 NOTE — ED Provider Notes (Signed)
History     CSN: [id number]  Arrival date & time 01/26/12  1144   First MD Initiated Contact with Patient 01/26/12 1205      Chief Complaint  Patient presents with  . Cough    (Consider location/radiation/quality/duration/timing/severity/associated sxs/prior treatment) Patient is a 51 y.o. male presenting with shortness of breath. The history is provided by the patient. No language interpreter was used.  Shortness of Breath  The current episode started today. The onset was sudden. The problem occurs frequently. The problem has been unchanged. The problem is moderate. The symptoms are relieved by nothing. Associated symptoms include shortness of breath.  Pt reports he was at church and a perfume triggered him to have an asthma attack  Past Medical History  Diagnosis Date  . Asthma     History reviewed. No pertinent past surgical history.  No family history on file.  History  Substance Use Topics  . Smoking status: Never Smoker   . Smokeless tobacco: Not on file  . Alcohol Use: No      Review of Systems  Respiratory: Positive for shortness of breath.   All other systems reviewed and are negative.    Allergies  Review of patient's allergies indicates no known allergies.  Home Medications   Current Outpatient Rx  Name Route Sig Dispense Refill  . ALBUTEROL SULFATE HFA 108 (90 BASE) MCG/ACT IN AERS Inhalation Inhale 2 puffs into the lungs every 6 (six) hours as needed.      BP 119/78  Pulse 102  Temp(Src) 97.7 F (36.5 C) (Oral)  Resp 18  SpO2 100%  Physical Exam  Nursing note and vitals reviewed. Constitutional: He appears well-developed and well-nourished.  HENT:  Head: Normocephalic and atraumatic.  Right Ear: External ear normal.  Left Ear: External ear normal.  Eyes: Pupils are equal, round, and reactive to light.  Neck: Normal range of motion.  Cardiovascular: Normal rate and normal heart sounds.   Pulmonary/Chest: He has wheezes.  Abdominal:  Soft.  Musculoskeletal: Normal range of motion.  Neurological: He is alert.  Skin: Skin is warm.  Psychiatric: He has a normal mood and affect.    ED Course  Procedures (including critical care time)  Labs Reviewed - No data to display No results found.   No diagnosis found.    MDM  Pt given albuterol inhaler.  Pt reports he feels much better.  Pt given aerochamber        Langston Masker, [place] 01/26/12 1403

## 2012-01-26 NOTE — ED Notes (Signed)
Patient using HFA without spacer.  Patient given spacer for use with HFA albuterol and given instruction on use.

## 2012-03-31 ENCOUNTER — Emergency Department (INDEPENDENT_AMBULATORY_CARE_PROVIDER_SITE_OTHER): Payer: Medicare Other

## 2012-03-31 ENCOUNTER — Encounter (HOSPITAL_BASED_OUTPATIENT_CLINIC_OR_DEPARTMENT_OTHER): Payer: Self-pay | Admitting: Student

## 2012-03-31 ENCOUNTER — Emergency Department (HOSPITAL_BASED_OUTPATIENT_CLINIC_OR_DEPARTMENT_OTHER)
Admission: EM | Admit: 2012-03-31 | Discharge: 2012-03-31 | Disposition: A | Discharge status: Home / Self Care | Payer: Medicare Other | Attending: Emergency Medicine | Admitting: Emergency Medicine

## 2012-03-31 DIAGNOSIS — J40 Bronchitis, not specified as acute or chronic: Secondary | ICD-10-CM

## 2012-03-31 DIAGNOSIS — R0602 Shortness of breath: Secondary | ICD-10-CM

## 2012-03-31 DIAGNOSIS — R0789 Other chest pain: Secondary | ICD-10-CM | POA: Insufficient documentation

## 2012-03-31 DIAGNOSIS — J45909 Unspecified asthma, uncomplicated: Secondary | ICD-10-CM | POA: Insufficient documentation

## 2012-03-31 DIAGNOSIS — R05 Cough: Secondary | ICD-10-CM

## 2012-03-31 DIAGNOSIS — Z79899 Other long term (current) drug therapy: Secondary | ICD-10-CM | POA: Insufficient documentation

## 2012-03-31 DIAGNOSIS — R062 Wheezing: Secondary | ICD-10-CM

## 2012-03-31 HISTORY — DX: Syncope and collapse: R55

## 2012-03-31 HISTORY — DX: Unspecified hearing loss, unspecified ear: H91.90

## 2012-03-31 MED ORDER — ALBUTEROL SULFATE (2.5 MG/3ML) 0.083% IN NEBU: 2.5000 mg | INHALATION_SOLUTION | Freq: Four times a day (QID) | RESPIRATORY_TRACT | Status: DC | PRN

## 2012-03-31 MED ORDER — AZITHROMYCIN 250 MG PO TABS: 250.0000 mg | ORAL_TABLET | Freq: Every day | ORAL | Status: AC

## 2012-03-31 MED ORDER — PREDNISONE 20 MG PO TABS: 40.0000 mg | ORAL_TABLET | Freq: Every day | ORAL | Status: DC

## 2012-03-31 MED ORDER — ALBUTEROL SULFATE HFA 108 (90 BASE) MCG/ACT IN AERS
2.0000 | INHALATION_SPRAY | RESPIRATORY_TRACT | Status: DC | PRN
  Administered 2012-03-31: 2 via RESPIRATORY_TRACT

## 2012-03-31 MED ORDER — ALBUTEROL SULFATE HFA 108 (90 BASE) MCG/ACT IN AERS
INHALATION_SPRAY | RESPIRATORY_TRACT | Status: AC
  Administered 2012-03-31: 2 via RESPIRATORY_TRACT
  Filled 2012-03-31: qty 6.7

## 2012-03-31 NOTE — ED Notes (Signed)
MD at bedside. 

## 2012-03-31 NOTE — ED Notes (Signed)
Sign Interpreter called.

## 2012-03-31 NOTE — ED Notes (Signed)
Interpreter in room with pt. Denies pain.

## 2012-03-31 NOTE — ED Provider Notes (Signed)
History     CSN: [id number]  Arrival date & time 03/31/12  1144   First MD Initiated Contact with Patient 03/31/12 1202      Chief Complaint  Patient presents with  . Shortness of Breath  . Medication Refill    out of albuterol    (Consider location/radiation/quality/duration/timing/severity/associated sxs/prior treatment) HPI Comments: Communicated with pt by writing and did lip reading:pt states that he has had a cough wheezing and sob for the last couple of days:pt states that he ran  Out of his inhaler and states that he can't get nebs filled any more:pt states that he walks most places that he goes and there is starting to be flower in bloom which affects his asthma:pt states that he has been cough especially at night and he had a productive cough  Patient is a 51 y.o. male presenting with shortness of breath. The history is provided by the patient. No language interpreter was used.  Shortness of Breath  The current episode started 3 to 5 days ago. The problem occurs occasionally. The problem has been unchanged. The problem is mild. The symptoms are relieved by beta-agonist inhalers. The symptoms are aggravated by allergens. Associated symptoms include cough, shortness of breath and wheezing. Pertinent negatives include no fever. There was no intake of a foreign body.    Past Medical History  Diagnosis Date  . Asthma   . Deaf     Call [phone] for patient's sign language interpreter.   . Barrett esophagus   . Chest pain   . Vasovagal syncope   . Dyslipidemia     Past Surgical History  Procedure Date  . Cardiac catheterization     History reviewed. No pertinent family history.  History  Substance Use Topics  . Smoking status: Never Smoker   . Smokeless tobacco: Not on file  . Alcohol Use: No      Review of Systems  Constitutional: Negative for fever.  HENT: Negative.   Eyes: Negative.   Respiratory: Positive for cough, shortness of breath and wheezing.     Cardiovascular: Negative.        Chest tightness resolved with using inhaler    Allergies  Review of patient's allergies indicates no known allergies.  Home Medications   Current Outpatient Rx  Name Route Sig Dispense Refill  . ALBUTEROL SULFATE HFA 108 (90 BASE) MCG/ACT IN AERS Inhalation Inhale 2 puffs into the lungs every 6 (six) hours as needed.      BP 123/71  Pulse 89  Temp(Src) 97.7 F (36.5 C) (Oral)  Resp 20  SpO2 97%  Physical Exam  Nursing note and vitals reviewed. Constitutional: He is oriented to person, place, and time. He appears well-developed and well-nourished.  HENT:  Head: Normocephalic and atraumatic.  Mouth/Throat: Oropharynx is clear and moist.  Eyes: Conjunctivae and EOM are normal.  Neck: Neck supple.  Cardiovascular: Normal rate and regular rhythm.   Pulmonary/Chest: Effort normal and breath sounds normal.  Abdominal: Soft. Bowel sounds are normal. There is no tenderness.  Musculoskeletal: Normal range of motion.  Neurological: He is alert and oriented to person, place, and time.  Skin: Skin is warm and dry.  Psychiatric: He has a normal mood and affect.    ED Course  Procedures (including critical care time)  Labs Reviewed - No data to display Dg Chest 2 View  03/31/2012  *RADIOLOGY REPORT*  Clinical Data: Cough, wheezing, shortness of breath.  CHEST - 2 VIEW  Comparison: None.  Findings: Slight peribronchial thickening. Heart and mediastinal contours are within normal limits.  No focal opacities or effusions.  No acute bony abnormality.  IMPRESSION: Slight bronchitic changes.  Stable findings.  Original Report Authenticated By: Cyndie Chime, M.D.     1. Bronchitis       MDM  Sign language interpreter here and verified all info with pt       Teressa Lower, NP 03/31/12 1330

## 2012-03-31 NOTE — ED Notes (Signed)
Pt in via EMS from home with c/o increasing shortness of breath with exertion over top of chest, pain increasing with  Exertion and pt is currently out of albuterol. Denies N V D CP LOC. Airway patent and intact, 12 Lead performed by EMS at scene - NSR.

## 2012-03-31 NOTE — ED Provider Notes (Signed)
Medical screening examination/treatment/procedure(s) were performed by non-physician practitioner and as supervising physician I was immediately available for consultation/collaboration.   Gwyneth Sprout, MD 03/31/12 1515

## 2012-03-31 NOTE — Discharge Instructions (Signed)
Bronchitis Bronchitis is a problem of the air tubes leading to your lungs. This problem makes it hard for air to get in and out of the lungs. You may cough a lot because your air tubes are narrow. Going without care can cause lasting (chronic) bronchitis. HOME CARE   Drink enough fluids to keep your pee (urine) clear or pale yellow.   Use a cool mist humidifier.   Quit smoking if you smoke. If you keep smoking, the bronchitis might not get better.   Only take medicine as told by your doctor.  GET HELP RIGHT AWAY IF:   Coughing keeps you awake.   You start to wheeze.   You become more sick or weak.   You have a hard time breathing or get short of breath.   You cough up blood.   Coughing lasts more than 2 weeks.   You have a fever.   Your baby is older than 3 months with a rectal temperature of 102 F (38.9 C) or higher.   Your baby is 3 months old or younger with a rectal temperature of 100.4 F (38 C) or higher.  MAKE SURE YOU:  Understand these instructions.   Will watch your condition.   Will get help right away if you are not doing well or get worse.  Document Released: 06/03/2008 Document Revised: 12/05/2011 Document Reviewed: 11/17/2009 Exit[employer] Patient Information 2012 [employer]. 

## 2012-06-02 HISTORY — PX: KNEE ARTHROSCOPY: SUR90

## 2012-08-03 ENCOUNTER — Other Ambulatory Visit: Payer: Self-pay | Admitting: Otolaryngology

## 2012-08-03 DIAGNOSIS — H9312 Tinnitus, left ear: Secondary | ICD-10-CM

## 2012-08-11 ENCOUNTER — Ambulatory Visit
Admission: RE | Admit: 2012-08-11 | Discharge: 2012-08-11 | Disposition: A | Discharge status: Home / Self Care | Payer: Medicare Other | Source: Ambulatory Visit | Attending: Otolaryngology | Admitting: Otolaryngology

## 2012-08-11 DIAGNOSIS — H9312 Tinnitus, left ear: Secondary | ICD-10-CM

## 2012-08-11 MED ORDER — GADOBENATE DIMEGLUMINE 529 MG/ML IV SOLN
15.0000 mL | Freq: Once | INTRAVENOUS | Status: AC | PRN
  Administered 2012-08-11: 15 mL via INTRAVENOUS

## 2012-10-02 ENCOUNTER — Encounter: Payer: Self-pay | Admitting: Gastroenterology

## 2012-10-08 ENCOUNTER — Encounter: Payer: Self-pay | Admitting: Gastroenterology

## 2012-10-19 ENCOUNTER — Telehealth: Payer: Self-pay | Admitting: Gastroenterology

## 2012-10-19 ENCOUNTER — Ambulatory Visit (AMBULATORY_SURGERY_CENTER): Payer: Medicare Other | Admitting: *Deleted

## 2012-10-19 VITALS — Ht 71.0 in | Wt 172.0 lb

## 2012-10-19 DIAGNOSIS — K2270 Barrett's esophagus without dysplasia: Secondary | ICD-10-CM

## 2012-10-19 NOTE — Progress Notes (Signed)
Deaf interpreter ordered for 11/02/12 at 10:00am for 2.5Hours.

## 2012-10-19 NOTE — Telephone Encounter (Signed)
Samples of Nexium have been given to the pt # [phone] [id number] 09/2014  Pt will have the pharmacy send a prior auth

## 2012-11-02 ENCOUNTER — Ambulatory Visit (AMBULATORY_SURGERY_CENTER): Payer: Medicare Other | Admitting: Gastroenterology

## 2012-11-02 ENCOUNTER — Encounter: Payer: Self-pay | Admitting: Gastroenterology

## 2012-11-02 VITALS — BP 117/71 | HR 79 | Temp 97.7°F | Resp 17 | Ht 71.0 in | Wt 172.0 lb

## 2012-11-02 DIAGNOSIS — K2270 Barrett's esophagus without dysplasia: Secondary | ICD-10-CM

## 2012-11-02 MED ORDER — SODIUM CHLORIDE 0.9 % IV SOLN: 500.0000 mL | INTRAVENOUS | Status: DC

## 2012-11-02 NOTE — Patient Instructions (Addendum)
Discharge instructions given with verbal understanding. Biopsies taken. Resume previous medications. YOU HAD AN ENDOSCOPIC PROCEDURE TODAY AT THE [facility]: Refer to the procedure report that was given to you for any specific questions about what was found during the examination.  If the procedure report does not answer your questions, please call your gastroenterologist to clarify.  If you requested that your care partner not be given the details of your procedure findings, then the procedure report has been included in a sealed envelope for you to review at your convenience later.  YOU SHOULD EXPECT: Some feelings of bloating in the abdomen. Passage of more gas than usual.  Walking can help get rid of the air that was put into your GI tract during the procedure and reduce the bloating. If you had a lower endoscopy (such as a colonoscopy or flexible sigmoidoscopy) you may notice spotting of blood in your stool or on the toilet paper. If you underwent a bowel prep for your procedure, then you may not have a normal bowel movement for a few days.  DIET: Your first meal following the procedure should be a light meal and then it is ok to progress to your normal diet.  A half-sandwich or bowl of soup is an example of a good first meal.  Heavy or fried foods are harder to digest and may make you feel nauseous or bloated.  Likewise meals heavy in dairy and vegetables can cause extra gas to form and this can also increase the bloating.  Drink plenty of fluids but you should avoid alcoholic beverages for 24 hours.  ACTIVITY: Your care partner should take you home directly after the procedure.  You should plan to take it easy, moving slowly for the rest of the day.  You can resume normal activity the day after the procedure however you should NOT DRIVE or use heavy machinery for 24 hours (because of the sedation medicines used during the test).    SYMPTOMS TO REPORT IMMEDIATELY: A gastroenterologist  can be reached at any hour.  During normal business hours, 8:30 AM to 5:00 PM Monday through Friday, call [phone].  After hours and on weekends, please call the GI answering service at [phone] who will take a message and have the physician on call contact you.   Following upper endoscopy (EGD)  Vomiting of blood or coffee ground material  New chest pain or pain under the shoulder blades  Painful or persistently difficult swallowing  New shortness of breath  Fever of 100F or higher  Black, tarry-looking stools  FOLLOW UP: If any biopsies were taken you will be contacted by phone or by letter within the next 1-3 weeks.  Call your gastroenterologist if you have not heard about the biopsies in 3 weeks.  Our staff will call the home number listed on your records the next business day following your procedure to check on you and address any questions or concerns that you may have at that time regarding the information given to you following your procedure. This is a courtesy call and so if there is no answer at the home number and we have not heard from you through the emergency physician on call, we will assume that you have returned to your regular daily activities without incident.  SIGNATURES/CONFIDENTIALITY: You and/or your care partner have signed paperwork which will be entered into your electronic medical record.  These signatures attest to the fact that that the information above on your After   Visit Summary has been reviewed and is understood.  Full responsibility of the confidentiality of this discharge information lies with you and/or your care-partner. 

## 2012-11-02 NOTE — Progress Notes (Signed)
Interpretor present  Pt doesn't have inhaler with him

## 2012-11-02 NOTE — Op Note (Signed)
[facility] [address] [place], [address]   ENDOSCOPY PROCEDURE REPORT  PATIENT: Nathaniel [name], Nathaniel [name]  MR#: [id number] BIRTHDATE: Aug 03, 1961 , 51  yrs. old GENDER: Male ENDOSCOPIST: Rachael Fee, MD PROCEDURE DATE:  11/02/2012 PROCEDURE:  EGD w/ biopsy ASA CLASS:     Class II INDICATIONS:  Barrett's without dysplasia for several years, last surveillance EGD was 3 years ago. MEDICATIONS: Fentanyl 75 mcg IV, Versed 8 mg IV, and These medications were titrated to patient response per physician's verbal order TOPICAL ANESTHETIC: Cetacaine Spray  DESCRIPTION OF PROCEDURE: After the risks benefits and alternatives of the procedure were thoroughly explained, informed consent was obtained.  The LB GIF-H180 [id number] endoscope was introduced through the mouth and advanced to the second portion of the duodenum. Without limitations.  The instrument was slowly withdrawn as the mucosa was fully examined.   There was a short segment of Barrett's appearing changes, biopsied and sent to pathology.  This was non-nodular, about 1cm long, two tongues.  The examination was otherwise normal.  Retroflexed views revealed no abnormalities.     The scope was then withdrawn from the patient and the procedure completed.  COMPLICATIONS: There were no complications. ENDOSCOPIC IMPRESSION: There was a short segment of Barrett's appearing changes, biopsied and sent to pathology. The examination was otherwise normal.  RECOMMENDATIONS: Await pathology results    eSigned:  Rachael Fee, MD 11/02/2012 11:11 AM

## 2012-11-02 NOTE — Progress Notes (Signed)
Patient did not experience any of the following events: a burn prior to discharge; a fall within the facility; wrong site/side/patient/procedure/implant event; or a hospital transfer or hospital admission upon discharge from the facility. (G8907) Patient did not have preoperative order for IV antibiotic SSI prophylaxis. (G8918)  

## 2012-11-03 ENCOUNTER — Telehealth: Payer: Self-pay

## 2012-11-03 ENCOUNTER — Telehealth: Payer: Self-pay | Admitting: Gastroenterology

## 2012-11-03 NOTE — Telephone Encounter (Signed)
Pt results not available phone number has been noted as per pt

## 2012-11-03 NOTE — Telephone Encounter (Signed)
Left message

## 2012-11-15 ENCOUNTER — Encounter: Payer: Self-pay | Admitting: Gastroenterology

## 2012-11-27 ENCOUNTER — Encounter (HOSPITAL_BASED_OUTPATIENT_CLINIC_OR_DEPARTMENT_OTHER): Payer: Self-pay | Admitting: Emergency Medicine

## 2012-11-27 ENCOUNTER — Emergency Department (HOSPITAL_BASED_OUTPATIENT_CLINIC_OR_DEPARTMENT_OTHER)
Admission: EM | Admit: 2012-11-27 | Discharge: 2012-11-27 | Disposition: A | Discharge status: Home / Self Care | Payer: Medicare Other | Attending: Emergency Medicine | Admitting: Emergency Medicine

## 2012-11-27 DIAGNOSIS — Z79899 Other long term (current) drug therapy: Secondary | ICD-10-CM | POA: Insufficient documentation

## 2012-11-27 DIAGNOSIS — Z8739 Personal history of other diseases of the musculoskeletal system and connective tissue: Secondary | ICD-10-CM | POA: Insufficient documentation

## 2012-11-27 DIAGNOSIS — H9190 Unspecified hearing loss, unspecified ear: Secondary | ICD-10-CM | POA: Insufficient documentation

## 2012-11-27 DIAGNOSIS — Z8719 Personal history of other diseases of the digestive system: Secondary | ICD-10-CM | POA: Insufficient documentation

## 2012-11-27 DIAGNOSIS — Z8639 Personal history of other endocrine, nutritional and metabolic disease: Secondary | ICD-10-CM | POA: Insufficient documentation

## 2012-11-27 DIAGNOSIS — Z862 Personal history of diseases of the blood and blood-forming organs and certain disorders involving the immune mechanism: Secondary | ICD-10-CM | POA: Insufficient documentation

## 2012-11-27 DIAGNOSIS — J45901 Unspecified asthma with (acute) exacerbation: Secondary | ICD-10-CM | POA: Insufficient documentation

## 2012-11-27 DIAGNOSIS — Z7982 Long term (current) use of aspirin: Secondary | ICD-10-CM | POA: Insufficient documentation

## 2012-11-27 MED ORDER — ALBUTEROL SULFATE HFA 108 (90 BASE) MCG/ACT IN AERS: 1.0000 | INHALATION_SPRAY | Freq: Four times a day (QID) | RESPIRATORY_TRACT | Status: DC | PRN

## 2012-11-27 MED ORDER — IPRATROPIUM BROMIDE 0.02 % IN SOLN
0.5000 mg | Freq: Once | RESPIRATORY_TRACT | Status: AC
  Administered 2012-11-27: 0.5 mg via RESPIRATORY_TRACT

## 2012-11-27 MED ORDER — ALBUTEROL SULFATE (5 MG/ML) 0.5% IN NEBU
INHALATION_SOLUTION | RESPIRATORY_TRACT | Status: AC
  Filled 2012-11-27: qty 1

## 2012-11-27 MED ORDER — ALBUTEROL SULFATE HFA 108 (90 BASE) MCG/ACT IN AERS
1.0000 | INHALATION_SPRAY | Freq: Once | RESPIRATORY_TRACT | Status: AC
  Administered 2012-11-27: 1 via RESPIRATORY_TRACT
  Filled 2012-11-27: qty 6.7

## 2012-11-27 MED ORDER — IPRATROPIUM BROMIDE 0.02 % IN SOLN
RESPIRATORY_TRACT | Status: AC
  Filled 2012-11-27: qty 2.5

## 2012-11-27 MED ORDER — ALBUTEROL SULFATE (5 MG/ML) 0.5% IN NEBU
5.0000 mg | INHALATION_SOLUTION | Freq: Once | RESPIRATORY_TRACT | Status: AC
  Administered 2012-11-27: 5 mg via RESPIRATORY_TRACT

## 2012-11-27 NOTE — ED Provider Notes (Signed)
History     CSN: [id number]  Arrival date & time 11/27/12  2042   First MD Initiated Contact with Patient 11/27/12 2045      Chief Complaint  Patient presents with  . Shortness of Breath    (Consider location/radiation/quality/duration/timing/severity/associated sxs/prior treatment) HPI Comments: Pt comes in with cc of sob and wheezing. Pt reports htat with the weather change, he has been having some dib x 2-3 days, and he used 2 breathing tx today - before he ran out. He felt like he was having some dib, so he called EMS. He has no chest pain, no new cough, no n/v/f/c and denies smoking or smoke exposure.    Patient is a 51 y.o. male presenting with shortness of breath. The history is provided by the patient.  Shortness of Breath  Associated symptoms include shortness of breath. Pertinent negatives include no chest pain, no rhinorrhea and no cough.    Past Medical History  Diagnosis Date  . Asthma   . Deaf     Call [phone] for patient's sign language interpreter.   . Barrett's esophagus   . Chest pain   . Vasovagal syncope   . Dyslipidemia   . Allergy     dry weather/seasonal  . Arthritis     knees  . GERD (gastroesophageal reflux disease)     Past Surgical History  Procedure Date  . Cardiac catheterization 2012  . Bunionectomy 2012    left  . Knee arthroscopy 06/02/2012    left    Family History  Problem Relation Age of Onset  . Colon cancer Neg Hx   . Esophageal cancer Neg Hx   . Rectal cancer Neg Hx   . Stomach cancer Neg Hx     History  Substance Use Topics  . Smoking status: Never Smoker   . Smokeless tobacco: Never Used  . Alcohol Use: Yes     Comment: rarely a beer      Review of Systems  Constitutional: Negative for activity change and appetite change.  HENT: Negative for congestion, rhinorrhea and postnasal drip.   Respiratory: Positive for shortness of breath. Negative for cough.   Cardiovascular: Negative for chest pain.    Gastrointestinal: Negative for abdominal pain.  Genitourinary: Negative for dysuria.    Allergies  Fish allergy and Shrimp  Home Medications   Current Outpatient Rx  Name  Route  Sig  Dispense  Refill  . ALBUTEROL SULFATE HFA 108 (90 BASE) MCG/ACT IN AERS   Inhalation   Inhale 2 puffs into the lungs every 6 (six) hours as needed.         . ALBUTEROL SULFATE HFA 108 (90 BASE) MCG/ACT IN AERS   Inhalation   Inhale 1-2 puffs into the lungs every 6 (six) hours as needed for wheezing.   1 Inhaler   0   . ALBUTEROL SULFATE (2.5 MG/3ML) 0.083% IN NEBU   Nebulization   Take 3 mLs (2.5 mg total) by nebulization every 6 (six) hours as needed for wheezing.   75 mL   12   . ASPIRIN 325 MG PO TBEC   Oral   Take 325 mg by mouth daily.         Marland Kitchen FLUTICASONE PROPIONATE 50 MCG/ACT NA SUSP   Nasal   Place 1 spray into the nose Daily.           BP 146/86  Pulse 87  Temp 97.6 F (36.4 C) (Oral)  Resp 22  SpO2  98%  Physical Exam  Nursing note and vitals reviewed. Constitutional: He is oriented to person, place, and time. He appears well-developed.  HENT:  Head: Normocephalic and atraumatic.  Eyes: Conjunctivae normal and EOM are normal. Pupils are equal, round, and reactive to light.  Neck: Normal range of motion. Neck supple.  Cardiovascular: Normal rate and regular rhythm.   Pulmonary/Chest: Effort normal and breath sounds normal. He has no wheezes. He has no rales.  Abdominal: Soft. Bowel sounds are normal. He exhibits no distension. There is no tenderness. There is no rebound and no guarding.  Neurological: He is alert and oriented to person, place, and time.  Skin: Skin is warm.    ED Course  Procedures (including critical care time)  Labs Reviewed - No data to display No results found.   1. Asthma exacerbation       MDM  Pt was given 1 treatment by RT prior to my arrival, and has no wheezing. He has good air movement, and good vitals.  It appears  that he has a mild asthma attack, and had no meds to treat, so he called EMS. No PTX on exam, no concerns for PNA. Will not treat with steroids, will give him an inhaler from the ER and right one more prescription.     Derwood Kaplan, MD 11/27/12 2147

## 2012-11-27 NOTE — ED Notes (Signed)
EMS sts pt called out for SOB, hx of asthma, reported using inhaler multiple times without relief. Pt reports this has been ongoing for 4 days, no change with day vs night, sts feels like asthma, feels tight in chest and SOB.

## 2012-12-20 ENCOUNTER — Emergency Department (HOSPITAL_COMMUNITY): Payer: PRIVATE HEALTH INSURANCE

## 2012-12-20 ENCOUNTER — Encounter (HOSPITAL_COMMUNITY): Payer: Self-pay | Admitting: Emergency Medicine

## 2012-12-20 ENCOUNTER — Inpatient Hospital Stay (HOSPITAL_COMMUNITY)
Admission: EM | Admit: 2012-12-20 | Discharge: 2012-12-22 | DRG: 066 | Disposition: A | Discharge status: Home / Self Care | Payer: PRIVATE HEALTH INSURANCE | Attending: Internal Medicine | Admitting: Internal Medicine

## 2012-12-20 DIAGNOSIS — Z23 Encounter for immunization: Secondary | ICD-10-CM

## 2012-12-20 DIAGNOSIS — Z7982 Long term (current) use of aspirin: Secondary | ICD-10-CM

## 2012-12-20 DIAGNOSIS — E785 Hyperlipidemia, unspecified: Secondary | ICD-10-CM | POA: Diagnosis present

## 2012-12-20 DIAGNOSIS — Z8709 Personal history of other diseases of the respiratory system: Secondary | ICD-10-CM

## 2012-12-20 DIAGNOSIS — R1013 Epigastric pain: Secondary | ICD-10-CM

## 2012-12-20 DIAGNOSIS — J45909 Unspecified asthma, uncomplicated: Secondary | ICD-10-CM | POA: Diagnosis present

## 2012-12-20 DIAGNOSIS — I639 Cerebral infarction, unspecified: Secondary | ICD-10-CM | POA: Diagnosis present

## 2012-12-20 DIAGNOSIS — R930 Abnormal findings on diagnostic imaging of skull and head, not elsewhere classified: Secondary | ICD-10-CM | POA: Diagnosis present

## 2012-12-20 DIAGNOSIS — R471 Dysarthria and anarthria: Secondary | ICD-10-CM | POA: Diagnosis present

## 2012-12-20 DIAGNOSIS — IMO0001 Reserved for inherently not codable concepts without codable children: Secondary | ICD-10-CM | POA: Diagnosis present

## 2012-12-20 DIAGNOSIS — Z79899 Other long term (current) drug therapy: Secondary | ICD-10-CM

## 2012-12-20 DIAGNOSIS — R12 Heartburn: Secondary | ICD-10-CM

## 2012-12-20 DIAGNOSIS — K219 Gastro-esophageal reflux disease without esophagitis: Secondary | ICD-10-CM | POA: Diagnosis present

## 2012-12-20 DIAGNOSIS — H9190 Unspecified hearing loss, unspecified ear: Secondary | ICD-10-CM | POA: Diagnosis present

## 2012-12-20 DIAGNOSIS — G459 Transient cerebral ischemic attack, unspecified: Secondary | ICD-10-CM

## 2012-12-20 DIAGNOSIS — Z8249 Family history of ischemic heart disease and other diseases of the circulatory system: Secondary | ICD-10-CM

## 2012-12-20 DIAGNOSIS — I6350 Cerebral infarction due to unspecified occlusion or stenosis of unspecified cerebral artery: Principal | ICD-10-CM

## 2012-12-20 LAB — RAPID URINE DRUG SCREEN, HOSP PERFORMED
Amphetamines: NOT DETECTED
Barbiturates: NOT DETECTED
Benzodiazepines: NOT DETECTED
Cocaine: NOT DETECTED
Tetrahydrocannabinol: NOT DETECTED

## 2012-12-20 LAB — COMPREHENSIVE METABOLIC PANEL
ALT: 17 U/L (ref 0–53)
AST: 19 U/L (ref 0–37)
Albumin: 3.6 g/dL (ref 3.5–5.2)
Calcium: 9 mg/dL (ref 8.4–10.5)
Sodium: 139 mEq/L (ref 135–145)
Total Protein: 6.7 g/dL (ref 6.0–8.3)

## 2012-12-20 LAB — CBC
MCH: 30.3 pg (ref 26.0–34.0)
MCV: 89.6 fL (ref 78.0–100.0)
Platelets: 198 10*3/uL (ref 150–400)
RDW: 12.6 % (ref 11.5–15.5)
WBC: 8.4 10*3/uL (ref 4.0–10.5)

## 2012-12-20 LAB — POCT I-STAT TROPONIN I

## 2012-12-20 LAB — DIFFERENTIAL
Basophils Absolute: 0.1 10*3/uL (ref 0.0–0.1)
Eosinophils Absolute: 0.2 10*3/uL (ref 0.0–0.7)
Eosinophils Relative: 2 % (ref 0–5)
Lymphocytes Relative: 39 % (ref 12–46)
Neutrophils Relative %: 51 % (ref 43–77)

## 2012-12-20 LAB — PROTIME-INR
INR: 0.94 (ref 0.00–1.49)
Prothrombin Time: 12.5 seconds (ref 11.6–15.2)

## 2012-12-20 LAB — POCT I-STAT, CHEM 8
BUN: 13 mg/dL (ref 6–23)
Calcium, Ion: 1.11 mmol/L — ABNORMAL LOW (ref 1.12–1.23)
HCT: 43 % (ref 39.0–52.0)
TCO2: 26 mmol/L (ref 0–100)

## 2012-12-20 LAB — GLUCOSE, CAPILLARY: Glucose-Capillary: 102 mg/dL — ABNORMAL HIGH (ref 70–99)

## 2012-12-20 MED ORDER — SENNOSIDES-DOCUSATE SODIUM 8.6-50 MG PO TABS: 1.0000 | ORAL_TABLET | Freq: Every evening | ORAL | Status: DC | PRN

## 2012-12-20 MED ORDER — ASPIRIN 325 MG PO TABS
325.0000 mg | ORAL_TABLET | Freq: Every day | ORAL | Status: DC
  Administered 2012-12-21 – 2012-12-22 (×2): 325 mg via ORAL
  Filled 2012-12-20 (×2): qty 1

## 2012-12-20 MED ORDER — SODIUM CHLORIDE 0.9 % IV SOLN
INTRAVENOUS | Status: DC
  Administered 2012-12-21: 1000 mL via INTRAVENOUS

## 2012-12-20 MED ORDER — ASPIRIN 300 MG RE SUPP
300.0000 mg | Freq: Every day | RECTAL | Status: DC
  Filled 2012-12-20 (×2): qty 1

## 2012-12-20 MED ORDER — FLUTICASONE PROPIONATE 50 MCG/ACT NA SUSP
1.0000 | Freq: Every day | NASAL | Status: DC
  Administered 2012-12-21 – 2012-12-22 (×2): 1 via NASAL
  Filled 2012-12-20: qty 16

## 2012-12-20 MED ORDER — ALBUTEROL SULFATE HFA 108 (90 BASE) MCG/ACT IN AERS
2.0000 | INHALATION_SPRAY | Freq: Four times a day (QID) | RESPIRATORY_TRACT | Status: DC | PRN
  Filled 2012-12-20: qty 6.7

## 2012-12-20 MED ORDER — SODIUM CHLORIDE 0.9 % IV SOLN: INTRAVENOUS | Status: AC

## 2012-12-20 MED ORDER — ALBUTEROL SULFATE (5 MG/ML) 0.5% IN NEBU: 2.5000 mg | INHALATION_SOLUTION | RESPIRATORY_TRACT | Status: DC | PRN

## 2012-12-20 NOTE — ED Notes (Signed)
Sing interpreter at the bedside.  

## 2012-12-20 NOTE — ED Notes (Signed)
New and old ECG's were given to Dr. Ranae Palms.

## 2012-12-20 NOTE — ED Notes (Signed)
Pt updated that sign language interpreter has been paged.

## 2012-12-20 NOTE — ED Notes (Signed)
Pt requested pen and paper.

## 2012-12-20 NOTE — ED Notes (Signed)
Pt reports that his coat is missing. States he had a coat on when he came in to the ED. RN and other staff instructed pt that he didn't not have a coat on at the time, that he was wearing a pink button up shirt. Pt also instructed that EMS started in an IV L arm, and that EMS would have had to of removed coat in order to start that IV. Charge nurse attempting to contact ambulance unit to see if they have his coat.

## 2012-12-20 NOTE — ED Notes (Signed)
PER EMS- Pt called EMS after having sudden onset weakness and increased slurred speech. Patient had L side weakness on scene. Patient alert, NAD at this time. Patient last seen normal 1935. Pt lives by himself at home.

## 2012-12-20 NOTE — H&P (Signed)
Nathaniel [name] is an 51 y.o. male.   Patient was seen and examined on December 20, 2012. I have used sign language interpreter as patient has difficulty hearing and speaking since age 36. PCP - Dr. Herbie [name]. Chief Complaint: Left facial numbness and weakness of the left upper and lower extremity. HPI: 51 year-old male with history of asthma and Barrett's esophagus with chronic difficulty in hearing and speaking since age 33 presents with complaints of left facial numbness difficulty seeing in the left eye with dysarthria and left upper and lower extremity weakness and numbness. Patient's symptoms started around 6 PM. His symptoms have gradually improved but his left facial numbness still persist. There is still mild weakness in the left upper and lower extremity. Patient's visual symptoms in the left eye has resolved. Patient was seen by neurologist on-call Dr. Roseanne [name]. CT head is negative for anything acute. Patient was felt not to be a candidate for TPA because his physical findings only showed minimal deficits.  Past Surgical History  Procedure Date  . Cardiac catheterization 2012  . Bunionectomy 2012    left  . Knee arthroscopy 06/02/2012    left    Family History  Problem Relation Age of Onset  . Colon cancer Neg Hx   . Esophageal cancer Neg Hx   . Rectal cancer Neg Hx   . Stomach cancer Neg Hx   . Ovarian cancer Mother   . CAD Father    Social History:  reports that he has never smoked. He has never used smokeless tobacco. He reports that he drinks alcohol. He reports that he does not use illicit drugs.  Allergies:  Allergies  Allergen Reactions  . Fish Allergy Itching and Rash    Soft shell fish  . Shrimp (Shellfish Allergy) Rash     (Not in a hospital admission)  Results for orders placed during the hospital encounter of 12/20/12 (from the past 48 hour(s))  PROTIME-INR     Status: Normal   Collection Time   12/20/12  8:15 PM      Component Value Range Comment   Prothrombin Time 12.5  11.6 - 15.2 seconds    INR 0.94  0.00 - 1.49   APTT     Status: Normal   Collection Time   12/20/12  8:15 PM      Component Value Range Comment   aPTT 30  24 - 37 seconds   CBC     Status: Normal   Collection Time   12/20/12  8:15 PM      Component Value Range Comment   WBC 8.4  4.0 - 10.5 K/uL    RBC 4.72  4.22 - 5.81 MIL/uL    Hemoglobin 14.3  13.0 - 17.0 g/dL    HCT 16.1  09.6 - 04.5 %    MCV 89.6  78.0 - 100.0 fL    MCH 30.3  26.0 - 34.0 pg    MCHC 33.8  30.0 - 36.0 g/dL    RDW 40.9  81.1 - 91.4 %    Platelets 198  150 - 400 K/uL   DIFFERENTIAL     Status: Normal   Collection Time   12/20/12  8:15 PM      Component Value Range Comment   Neutrophils Relative 51  43 - 77 %    Neutro Abs 4.3  1.7 - 7.7 K/uL    Lymphocytes Relative 39  12 - 46 %    Lymphs Abs 3.3  0.7 -  4.0 K/uL    Monocytes Relative 7  3 - 12 %    Monocytes Absolute 0.6  0.1 - 1.0 K/uL    Eosinophils Relative 2  0 - 5 %    Eosinophils Absolute 0.2  0.0 - 0.7 K/uL    Basophils Relative 1  0 - 1 %    Basophils Absolute 0.1  0.0 - 0.1 K/uL   COMPREHENSIVE METABOLIC PANEL     Status: Abnormal   Collection Time   12/20/12  8:15 PM      Component Value Range Comment   Sodium 139  135 - 145 mEq/L    Potassium 4.0  3.5 - 5.1 mEq/L    Chloride 104  96 - 112 mEq/L    CO2 23  19 - 32 mEq/L    Glucose, Bld 114 (*) 70 - 99 mg/dL    BUN 12  6 - 23 mg/dL    Creatinine, Ser 8.29  0.50 - 1.35 mg/dL    Calcium 9.0  8.4 - 56.2 mg/dL    Total Protein 6.7  6.0 - 8.3 g/dL    Albumin 3.6  3.5 - 5.2 g/dL    AST 19  0 - 37 U/L    ALT 17  0 - 53 U/L    Alkaline Phosphatase 44  39 - 117 U/L    Total Bilirubin 0.2 (*) 0.3 - 1.2 mg/dL    GFR calc non Af Amer >90  >90 mL/min    GFR calc Af Amer >90  >90 mL/min   TROPONIN I     Status: Normal   Collection Time   12/20/12  8:18 PM      Component Value Range Comment   Troponin I <0.30  <0.30 ng/mL   POCT I-STAT TROPONIN I     Status: Normal    Collection Time   12/20/12  8:38 PM      Component Value Range Comment   Troponin i, poc 0.01  0.00 - 0.08 ng/mL    Comment 3            POCT I-STAT, CHEM 8     Status: Abnormal   Collection Time   12/20/12  8:39 PM      Component Value Range Comment   Sodium 142  135 - 145 mEq/L    Potassium 4.2  3.5 - 5.1 mEq/L    Chloride 107  96 - 112 mEq/L    BUN 13  6 - 23 mg/dL    Creatinine, Ser 1.30  0.50 - 1.35 mg/dL    Glucose, Bld 865 (*) 70 - 99 mg/dL    Calcium, Ion 7.84 (*) 1.12 - 1.23 mmol/L    TCO2 26  0 - 100 mmol/L    Hemoglobin 14.6  13.0 - 17.0 g/dL    HCT 69.6  29.5 - 28.4 %   GLUCOSE, CAPILLARY     Status: Abnormal   Collection Time   12/20/12  8:43 PM      Component Value Range Comment   Glucose-Capillary 102 (*) 70 - 99 mg/dL    Comment 1 Documented in Chart      Comment 2 Notify RN      Ct Head Wo Contrast  12/20/2012  *RADIOLOGY REPORT*  Clinical Data: Code stroke, left side weakness, blurred vision left eye, left facial numbness  CT HEAD WITHOUT CONTRAST  Technique:  Contiguous axial images were obtained from the base of the skull through the vertex without contrast.  Comparison: 08/02/2006  Findings: Prominent ventricular system diffusely, unchanged. No midline shift or mass effect. Normal appearance of brain parenchyma otherwise identified. No intracranial hemorrhage, mass lesion or evidence of acute infarction. No extra-axial fluid collections. Bones and sinuses unremarkable.  IMPRESSION: No acute intracranial abnormalities. Chronic diffuse prominence of the ventricular system question normal pressure hydrocephalus.  Findings called to Dr. Roseanne [name] on 12/20/2012 at 2032 hours.   Original Report Authenticated By: Nathaniel [name], M.D.     Review of Systems  Constitutional: Negative.   HENT: Negative.   Eyes: Negative.   Respiratory: Negative.   Cardiovascular: Negative.   Gastrointestinal: Negative.   Genitourinary: Negative.   Musculoskeletal: Negative.   Skin:  Negative.   Neurological:       Left sided facial weakness and left sided weakness.  Endo/Heme/Allergies: Negative.   Psychiatric/Behavioral: Negative.     Blood pressure 130/80, pulse 80, temperature 98.5 F (36.9 C), resp. rate 21, SpO2 94.00%. Physical Exam  Constitutional: He is oriented to person, place, and time. He appears well-developed and well-nourished. No distress.  HENT:  Head: Normocephalic and atraumatic.  Right Ear: External ear normal.  Left Ear: External ear normal.  Nose: Nose normal.  Mouth/Throat: Oropharynx is clear and moist. No oropharyngeal exudate.  Eyes: Conjunctivae normal are normal. Pupils are equal, round, and reactive to light. Right eye exhibits no discharge. Left eye exhibits no discharge. No scleral icterus.  Neck: Normal range of motion. Neck supple.  Cardiovascular: Normal rate and regular rhythm.   Respiratory: Effort normal and breath sounds normal. No respiratory distress. He has no wheezes. He has no rales.  GI: Soft. Bowel sounds are normal. He exhibits no distension. There is no tenderness. There is no rebound.  Musculoskeletal: He exhibits no edema and no tenderness.  Neurological: He is alert and oriented to person, place, and time.       Has mild weakness in the left upper and left lower extremity around 4/5.  Skin: Skin is warm and dry. He is not diaphoretic.     Assessment/Plan #1. Possible CVA - patient has been placed on neurochecks. Telemetry shows sinus rhythm. MRI MRA brain. 2-D echo and carotid Dopplers has been ordered. Aspirin. Further recommendations per neurologist. #2. History of bronchial asthma - presently not wheezing. #3. History of hyperlipidemia - presently on no medications. Check lipid panel. #4. Difficulty hearing and speaking since age 69. #5. History of Barrett's esophagus. #6. Normal cardiac catheter in 2012.   CODE STATUS - full code.  Eduard Clos 12/20/2012, 10:28 PM

## 2012-12-20 NOTE — ED Notes (Signed)
Secretary notified about sign language interpreter, and interpreter paged.

## 2012-12-20 NOTE — ED Notes (Signed)
CBG was 102. Notified Nurse Ian Malkin.

## 2012-12-20 NOTE — ED Notes (Signed)
Pt wrote with Right hand, "call sign language interpreter."

## 2012-12-20 NOTE — ED Provider Notes (Signed)
History     CSN: [id number]  Arrival date & time 12/20/12  2017   First MD Initiated Contact with Patient 12/20/12 2025      Chief Complaint  Patient presents with  . Code Stroke    (Consider location/radiation/quality/duration/timing/severity/associated sxs/prior treatment) HPI Pt called EMS for L sided weakness and numbness with increased slurred speech. Pt is a poor historian and has baseline slurred speech and hearing difficulties. Per EMS last seen normal at 1935. Pt denies fever, chills, CP, SOB, cough, abd, N/V/D. Stroke team at bedside.  Past Medical History  Diagnosis Date  . Asthma   . Deaf     Call [phone] for patient's sign language interpreter.   . Barrett's esophagus   . Chest pain   . Vasovagal syncope   . Dyslipidemia   . Allergy     dry weather/seasonal  . Arthritis     knees  . GERD (gastroesophageal reflux disease)     Past Surgical History  Procedure Date  . Cardiac catheterization 2012  . Bunionectomy 2012    left  . Knee arthroscopy 06/02/2012    left    Family History  Problem Relation Age of Onset  . Colon cancer Neg Hx   . Esophageal cancer Neg Hx   . Rectal cancer Neg Hx   . Stomach cancer Neg Hx     History  Substance Use Topics  . Smoking status: Never Smoker   . Smokeless tobacco: Never Used  . Alcohol Use: Yes     Comment: rarely a beer      Review of Systems  Constitutional: Negative for fever and chills.  HENT: Negative for neck pain.   Respiratory: Negative for shortness of breath.   Cardiovascular: Negative for chest pain.  Gastrointestinal: Negative for nausea, vomiting and abdominal pain.  Musculoskeletal: Negative for back pain.  Skin: Negative for rash and wound.  Neurological: Positive for speech difficulty, weakness and numbness. Negative for dizziness and headaches.    Allergies  Fish allergy and Shrimp  Home Medications   Current Outpatient Rx  Name  Route  Sig  Dispense  Refill  . ALBUTEROL  SULFATE HFA 108 (90 BASE) MCG/ACT IN AERS   Inhalation   Inhale 2 puffs into the lungs every 6 (six) hours as needed. For wheezing         . ALBUTEROL SULFATE (2.5 MG/3ML) 0.083% IN NEBU   Nebulization   Take 3 mLs (2.5 mg total) by nebulization every 6 (six) hours as needed for wheezing.   75 mL   12   . ASPIRIN 325 MG PO TBEC   Oral   Take 650 mg by mouth daily.          Marland Kitchen FLUTICASONE PROPIONATE 50 MCG/ACT NA SUSP   Nasal   Place 1 spray into the nose Daily.           BP 135/85  Pulse 80  Temp 99.7 F (37.6 C)  Resp 20  SpO2 97%  Physical Exam  Nursing note and vitals reviewed. Constitutional: He appears well-developed and well-nourished. No distress.  HENT:  Head: Normocephalic and atraumatic.  Mouth/Throat: Oropharynx is clear and moist.  Eyes: EOM are normal. Pupils are equal, round, and reactive to light.  Neck: Normal range of motion. Neck supple.  Cardiovascular: Normal rate and regular rhythm.   Pulmonary/Chest: Effort normal and breath sounds normal. No respiratory distress. He has no wheezes. He has no rales.  Abdominal: Soft. Bowel sounds are  normal. There is no tenderness. There is no rebound and no guarding.  Musculoskeletal: Normal range of motion. He exhibits no edema and no tenderness.  Neurological: He is alert.       Stroke scale score of 2 for decreased sensation and slurred speech. Pt with 5/5 motor in all ext with encouragement.   Skin: Skin is warm and dry. No rash noted. No erythema.  Psychiatric: He has a normal mood and affect. His behavior is normal.    ED Course  Procedures (including critical care time)  Labs Reviewed  POCT I-STAT, CHEM 8 - Abnormal; Notable for the following:    Glucose, Bld 115 (*)     Calcium, Ion 1.11 (*)     All other components within normal limits  GLUCOSE, CAPILLARY - Abnormal; Notable for the following:    Glucose-Capillary 102 (*)     All other components within normal limits  PROTIME-INR  APTT   CBC  DIFFERENTIAL  POCT I-STAT TROPONIN I  COMPREHENSIVE METABOLIC PANEL  TROPONIN I  URINE RAPID DRUG SCREEN (HOSP PERFORMED)   Ct Head Wo Contrast  12/20/2012  *RADIOLOGY REPORT*  Clinical Data: Code stroke, left side weakness, blurred vision left eye, left facial numbness  CT HEAD WITHOUT CONTRAST  Technique:  Contiguous axial images were obtained from the base of the skull through the vertex without contrast.  Comparison: 08/02/2006  Findings: Prominent ventricular system diffusely, unchanged. No midline shift or mass effect. Normal appearance of brain parenchyma otherwise identified. No intracranial hemorrhage, mass lesion or evidence of acute infarction. No extra-axial fluid collections. Bones and sinuses unremarkable.  IMPRESSION: No acute intracranial abnormalities. Chronic diffuse prominence of the ventricular system question normal pressure hydrocephalus.  Findings called to Dr. Roseanne Reno on 12/20/2012 at 2032 hours.   Original Report Authenticated By: Ulyses Southward, M.D.      1. TIA (transient ischemic attack)      Date: 12/20/2012  Rate: 80  Rhythm: normal sinus rhythm  QRS Axis: normal  Intervals: normal  ST/T Wave abnormalities: normal  Conduction Disutrbances:none  Narrative Interpretation:   Old EKG Reviewed: none available      MDM  Dr Roseanne Reno does not think pt is tPA candidate. Advises admit to [facility] for TIA vs small CVA.   [facility] will admit        Loren Racer, MD 12/20/12 2117

## 2012-12-20 NOTE — Consult Note (Signed)
Referring Physician: Dr. Ranae Palms    Chief Complaint: Left-sided numbness and slurring of speech.  HPI: Nathaniel [name] is an 51 y.o. male with a history of dyslipidemia, syncope, Barrett's esophagus, hearing loss and asthma, brought to the emergency room and code stroke status with new onset numbness involving his left side as well as complaint of left-sided weakness. Patient also indicated that speech was more slurred beyond baseline dysarthria, presumably associated with chronic hearing loss. Has no previous history of stroke nor TIA. Patient has been on aspirin 325 mg per day. CT scan showed chronic prominence of the ventricular system, but otherwise unremarkable, including no acute changes. NIH stroke score was 2 for dysarthria and subjective sensory deficit on the left. Code stroke was canceled.  LSN: [id number] tonight tPA Given: No: Minimal deficits MRankin: 0  Past Medical History  Diagnosis Date  . Asthma   . Deaf     Call [phone] for patient's sign language interpreter.   . Barrett's esophagus   . Chest pain   . Vasovagal syncope   . Dyslipidemia   . Allergy     dry weather/seasonal  . Arthritis     knees  . GERD (gastroesophageal reflux disease)     Family History  Problem Relation Age of Onset  . Colon cancer Neg Hx   . Esophageal cancer Neg Hx   . Rectal cancer Neg Hx   . Stomach cancer Neg Hx      Medications:  Prior to Admission:  Albuterol inhaler 2 puffs every 6 hours when necessary Aspirin 325 mg per day Flonase nasal spray once daily  Physical Examination: Blood pressure 135/85, pulse 80, temperature 99.7 F (37.6 C), resp. rate 20, SpO2 97.00%.  Neurologic Examination: Mental Status: Alert, oriented to time as well as place. Moderate to severe hearing loss with moderate dysarthric speech. Able to follow commands to the extent of his hearing comprehension. Cranial Nerves: II-Visual fields were normal. III/IV/VI-Pupils were equal and reacted.  Extraocular movements were full and conjugate.    V/VII-slight left facial numbness; no facial weakness. VIII-moderate to severe hearing loss bilaterally X-moderately dysarthric speech. XII-midline tongue extension Motor: 5/5 bilaterally with normal tone and bulk, although inconsistent effort with manual testing strength of his left upper and lower extremities. Sensory: Reduced tactile sensation over left side compared to the right Deep Tendon Reflexes: 2+ and symmetric. Plantars: Mute bilaterally Cerebellar: Normal finger-to-nose testing with right upper extremity, poor effort with left upper extremity.   Ct Head Wo Contrast  12/20/2012  *RADIOLOGY REPORT*  Clinical Data: Code stroke, left side weakness, blurred vision left eye, left facial numbness  CT HEAD WITHOUT CONTRAST  Technique:  Contiguous axial images were obtained from the base of the skull through the vertex without contrast.  Comparison: 08/02/2006  Findings: Prominent ventricular system diffusely, unchanged. No midline shift or mass effect. Normal appearance of brain parenchyma otherwise identified. No intracranial hemorrhage, mass lesion or evidence of acute infarction. No extra-axial fluid collections. Bones and sinuses unremarkable.  IMPRESSION: No acute intracranial abnormalities. Chronic diffuse prominence of the ventricular system question normal pressure hydrocephalus.  Findings called to Dr. Roseanne Reno on 12/20/2012 at 2032 hours.   Original Report Authenticated By: Ulyses Southward, M.D.     Assessment: 51 y.o. male with dyslipidemia presenting with left-sided numbness and exacerbation of chronic dysarthria. TIA or small subcortical right cerebral infarction cannot be ruled out at this point.  Stroke Risk Factors - hyperlipidemia  Plan: 1. HgbA1c, fasting lipid panel 2. MRI,  MRA  of the brain without contrast 3. PT consult, OT consult, Speech consult 4. Echocardiogram 5. Carotid dopplers 6. Prophylactic therapy-Antiplatelet  med: Aspirin 325 mg per day 7. Risk factor modification 8. Telemetry monitoring  C.R. Roseanne Reno, MD [facility] [phone]  12/20/2012, 8:48 PM

## 2012-12-20 NOTE — ED Notes (Signed)
Pt updated on plan of care. Interpreter still at the bedside.

## 2012-12-20 NOTE — Code Documentation (Signed)
51 yo wm brought in via [facility] for acute onset Lt weakness, slurred speech, & facial numbness.  On arrival to [facility] pt found to have improving symptoms.  Pt has known speech language deficit and cont. Lt facial numbness.  Code stroke called 1958, pt arrival 2017, EDP exam 2018, stroke team arrival 2018, LSN 1830, pt arrival in CT scan, phlebotomist arrival 2021. NIH 2 for slurred speech & mild sensory.  Code stroke cancelled at 2037. Pt requested sign language interpreter.

## 2012-12-20 NOTE — ED Notes (Signed)
Cancelling CODE STROKE per Dr. Roseanne Reno

## 2012-12-21 ENCOUNTER — Encounter (HOSPITAL_COMMUNITY): Payer: Self-pay | Admitting: *Deleted

## 2012-12-21 ENCOUNTER — Inpatient Hospital Stay (HOSPITAL_COMMUNITY): Payer: PRIVATE HEALTH INSURANCE

## 2012-12-21 DIAGNOSIS — IMO0001 Reserved for inherently not codable concepts without codable children: Secondary | ICD-10-CM | POA: Diagnosis present

## 2012-12-21 DIAGNOSIS — H9190 Unspecified hearing loss, unspecified ear: Secondary | ICD-10-CM | POA: Diagnosis present

## 2012-12-21 LAB — LIPID PANEL
Cholesterol: 180 mg/dL (ref 0–200)
Triglycerides: 160 mg/dL — ABNORMAL HIGH (ref <150)

## 2012-12-21 LAB — COMPREHENSIVE METABOLIC PANEL
ALT: 17 U/L (ref 0–53)
AST: 20 U/L (ref 0–37)
Albumin: 3.4 g/dL — ABNORMAL LOW (ref 3.5–5.2)
Alkaline Phosphatase: 45 U/L (ref 39–117)
BUN: 11 mg/dL (ref 6–23)
Chloride: 105 mEq/L (ref 96–112)
Potassium: 4.3 mEq/L (ref 3.5–5.1)
Sodium: 140 mEq/L (ref 135–145)
Total Bilirubin: 0.3 mg/dL (ref 0.3–1.2)

## 2012-12-21 LAB — CBC
HCT: 41.9 % (ref 39.0–52.0)
MCH: 29.7 pg (ref 26.0–34.0)
MCHC: 32.9 g/dL (ref 30.0–36.0)
MCV: 90.3 fL (ref 78.0–100.0)
Platelets: 183 10*3/uL (ref 150–400)
RDW: 12.7 % (ref 11.5–15.5)

## 2012-12-21 MED ORDER — INFLUENZA VIRUS VACC SPLIT PF IM SUSP
0.5000 mL | INTRAMUSCULAR | Status: DC
  Filled 2012-12-21: qty 0.5

## 2012-12-21 MED ORDER — SIMVASTATIN 20 MG PO TABS
20.0000 mg | ORAL_TABLET | Freq: Every day | ORAL | Status: DC
  Administered 2012-12-21: 20 mg via ORAL
  Filled 2012-12-21 (×2): qty 1

## 2012-12-21 MED ORDER — ACETAMINOPHEN 325 MG PO TABS: 650.0000 mg | ORAL_TABLET | Freq: Four times a day (QID) | ORAL | Status: DC | PRN

## 2012-12-21 NOTE — Progress Notes (Signed)
Stroke Team Progress Note  HISTORY Nathaniel [name] is an 51 y.o. male with a history of dyslipidemia, syncope, Barrett's esophagus, hearing loss and asthma, brought to the emergency room 12/20/2012 in code stroke status with new onset numbness involving his left side as well as complaint of left-sided weakness. Patient also indicated that speech was more slurred beyond baseline dysarthria, presumably associated with chronic hearing loss. Has no previous history of stroke nor TIA. Patient has been on aspirin 325 mg per day. CT scan showed chronic prominence of the ventricular system, but otherwise unremarkable, including no acute changes. NIH stroke score was 2 for dysarthria and subjective sensory deficit on the left. Code stroke was canceled. Patient was not a TPA candidate secondary to minimal deficits. He was admitted for further evaluation and treatment.  SUBJECTIVE His interpreter and RN are at the bedside.  Overall he feels his condition is gradually improving. Radiology just arrived to take him to MRI. He would like to see the scan once completed.  OBJECTIVE Most recent Vital Signs: Filed Vitals:   12/21/12 0130 12/21/12 0344 12/21/12 0600 12/21/12 0920  BP: 118/77 116/74 114/67 115/78  Pulse: 76 83 76 78  Temp: 97.7 F (36.5 C) 98 F (36.7 C) 97.7 F (36.5 C) 98.1 F (36.7 C)  TempSrc: Oral Oral Oral Oral  Resp: 18 18 18 18   SpO2: 98% 98% 97% 98%   CBG (last 3)   Basename 12/20/12 2043  GLUCAP 102*    IV Fluid Intake:     MEDICATIONS    . sodium chloride   Intravenous STAT  . aspirin  300 mg Rectal Daily   Or  . aspirin  325 mg Oral Daily  . fluticasone  1 spray Each Nare Daily  . influenza  inactive virus vaccine  0.5 mL Intramuscular Tomorrow-1000   PRN:  acetaminophen, albuterol, albuterol, senna-docusate  Diet:  Cardiac thin liquids Activity:  OOB with assistance DVT Prophylaxis:  SCDs   CLINICALLY SIGNIFICANT STUDIES Basic Metabolic Panel:  Lab 12/21/12  0700 12/20/12 2039 12/20/12 2015  NA 140 142 --  K 4.3 4.2 --  CL 105 107 --  CO2 24 -- 23  GLUCOSE 96 115* --  BUN 11 13 --  CREATININE 0.79 1.00 --  CALCIUM 8.7 -- 9.0  MG -- -- --  PHOS -- -- --   Liver Function Tests:  Lab 12/21/12 0700 12/20/12 2015  AST 20 19  ALT 17 17  ALKPHOS 45 44  BILITOT 0.3 0.2*  PROT 6.4 6.7  ALBUMIN 3.4* 3.6   CBC:  Lab 12/21/12 0700 12/20/12 2039 12/20/12 2015  WBC 6.2 -- 8.4  NEUTROABS -- -- 4.3  HGB 13.8 14.6 --  HCT 41.9 43.0 --  MCV 90.3 -- 89.6  PLT 183 -- 198   Coagulation:  Lab 12/20/12 2015  LABPROT 12.5  INR 0.94   Cardiac Enzymes:  Lab 12/20/12 2018  CKTOTAL --  CKMB --  CKMBINDEX --  TROPONINI <0.30   Urinalysis: No results found for this basename: COLORURINE:2,APPERANCEUR:2,LABSPEC:2,PHURINE:2,GLUCOSEU:2,HGBUR:2,BILIRUBINUR:2,KETONESUR:2,PROTEINUR:2,UROBILINOGEN:2,NITRITE:2,LEUKOCYTESUR:2 in the last 168 hours Lipid Panel    Component Value Date/Time   CHOL 180 12/21/2012 0700   TRIG 160* 12/21/2012 0700   HDL 36* 12/21/2012 0700   CHOLHDL 5.0 12/21/2012 0700   VLDL 32 12/21/2012 0700   LDLCALC 112* 12/21/2012 0700   HgbA1C  Lab Results  Component Value Date   HGBA1C 5.9* 06/24/2011    Urine Drug Screen:     Component Value Date/Time  LABOPIA POSITIVE* 12/20/2012 2152   COCAINSCRNUR NONE DETECTED 12/20/2012 2152   LABBENZ NONE DETECTED 12/20/2012 2152   AMPHETMU NONE DETECTED 12/20/2012 2152   THCU NONE DETECTED 12/20/2012 2152   LABBARB NONE DETECTED 12/20/2012 2152    Alcohol Level: No results found for this basename: ETH:2 in the last 168 hours  CT of the brain  12/20/2012 No acute intracranial abnormalities. Chronic diffuse prominence of the ventricular system question normal pressure hydrocephalus.  MRI of the brain    MRA of the brain    2D Echocardiogram    Carotid Doppler    CXR    EKG  normal EKG, normal sinus rhythm.   Therapy Recommendations PT - none, OT - none  GENERAL  EXAM: Patient is in no distress  CARDIOVASCULAR: Regular rate and rhythm, no murmurs, no carotid bruits  NEUROLOGIC: MENTAL STATUS: awake, alert, language fluent, comprehension intact, naming intact; COMMUNICATES VIA TRANSLATOR (SIGN LANGUAGE) CRANIAL NERVE: pupils equal and reactive to light, visual fields full to confrontation, extraocular muscles intact, no nystagmus, facial sensation and strength symmetric, uvula midline, shoulder shrug symmetric, tongue midline. MOTOR: normal bulk and tone, full strength in the BUE, BLE SENSORY: SLIGHTLY DECR IN LEFT FACE. COORDINATION: finger-nose-finger NORMAL. REFLEXES: deep tendon reflexes present and symmetric   ASSESSMENT Nathaniel [name] is a 51 y.o. male presenting with left sided numbness and weakness . Imaging pending. Suspect a right subcortical infarct.  Work up underway. On aspirin 325 mg orally every day prior to admission. Now on aspirin 325 mg orally every day for secondary stroke prevention. Patient with resultant left facial numbness.  Hyperlipidemia, LDL 112, not on statin PTA, goal LDL < 100 Deaf  Hospital day # 1  TREATMENT/PLAN  Continue aspirin 325 mg orally every day for secondary stroke prevention for now. If confirmed on MRI, will change to Plavix 75 mg daily.  Add statin  F/u MRI, MRA, 2D, carotid dopplers  [name], MSN, RN, ANVP-BC, ANP-BC, GNP-BC [facility] Pager: [phone] 12/21/2012 9:28 AM  I have personally obtained a history, examined the patient, evaluated imaging results, and formulated the assessment and plan of care. I agree with the above.   [facility] - Stroke Team Joycelyn Schmid, MD 12/21/2012, 5:45 PM   Please refer to [email or web address] for on-call Stroke MD

## 2012-12-21 NOTE — Progress Notes (Signed)
  Echocardiogram 2D Echocardiogram has been performed.  Yann Biehn 12/21/2012, 12:01 PM

## 2012-12-21 NOTE — Progress Notes (Signed)
Bilateral:  No evidence of hemodynamically significant internal carotid artery stenosis.   Vertebral artery flow is antegrade.     

## 2012-12-21 NOTE — Evaluation (Signed)
Occupational Therapy Evaluation Patient Details Name: Nathaniel [name] MRN: [id number] DOB: [birth year] Today's Date: 12/21/2012 Time: 4098-1191 OT Time Calculation (min): 16 min  OT Assessment / Plan / Recommendation Clinical Impression  This 51 yo admitted with left sided numbness and strength presents to acute OT at an Independent level. Acute OT will sign off.    OT Assessment  Patient does not need any further OT services          Equipment Recommendations  None recommended by OT          Precautions / Restrictions Precautions Precautions: None Restrictions Weight Bearing Restrictions: No       ADL  Transfers/Ambulation Related to ADLs: Independent with all ADL Comments: Independent with all BADLs        Visit Information  Last OT Received On: 12/21/12 Assistance Needed: +1 PT/OT Co-Evaluation/Treatment: Yes    Subjective Data  Subjective: reports only numbness on left side of his face now--no issues with left arm or leg    Prior Functioning     Home Living Lives With: Spouse Available Help at Discharge: Family;Available PRN/intermittently (wife works second shift Friday-Tuesday) Type of Home: Apartment Home Access: Level entry Home Layout: One level Bathroom Shower/Tub: Forensic scientist: Standard Bathroom Accessibility: Yes How Accessible: Accessible via walker Home Adaptive Equipment: Straight cane Prior Function Level of Independence: Independent Able to Take Stairs?: Yes Vocation: On disability Communication Communication: Deaf (hearing issues since age 22) Dominant Hand: Right         Vision/Perception Vision - Assessment Eye Alignment: Within Functional Limits   Cognition  Overall Cognitive Status: Appears within functional limits for tasks assessed/performed Arousal/Alertness: Awake/alert Orientation Level: Appears intact for tasks assessed Behavior During Session: [facility] for tasks performed     Extremity/Trunk Assessment Right Upper Extremity Assessment RUE ROM/Strength/Tone: Within functional levels RUE Sensation: WFL - Light Touch RUE Coordination: WFL - gross/fine motor Left Upper Extremity Assessment LUE ROM/Strength/Tone: Within functional levels LUE Sensation: WFL - Light Touch LUE Coordination: WFL - gross/fine motor     Mobility Bed Mobility Details for Bed Mobility Assistance: Independent with all Transfers Details for Transfer Assistance: Independent with all           Balance Balance Balance Assessed: No   End of Session OT - End of Session Equipment Utilized During Treatment:  (None) Activity Tolerance: Patient tolerated treatment well Patient left: in chair;with call bell/phone within reach Nurse Communication: Mobility status (once IV gone, pt can be up by himself)       Evette Georges [phone] 12/21/2012, 9:03 AM

## 2012-12-21 NOTE — Evaluation (Signed)
Physical Therapy Evaluation Patient Details Name: Nathaniel [name] MRN: [id number] DOB: [birth year] Today's Date: 12/21/2012 Time: 4098-1191 PT Time Calculation (min): 17 min  PT Assessment / Plan / Recommendation Clinical Impression  Pt 51 yo male with long standing history of speech and hearing deficits since he was 51 yrs old. pt now with mild stroke however pt functioning at baseline. pt with minor L hand weakness otherwise functioning at baseline. Patient does not need acute skilled PT services. PT signing off. Please re-consult if needed in future. Patient safe to d/c home with spouse when medically appropriate.    PT Assessment  Patent does not need any further PT services    Follow Up Recommendations  No PT follow up    Does the patient have the potential to tolerate intense rehabilitation      Barriers to Discharge        Equipment Recommendations  None recommended by PT    Recommendations for Other Services     Frequency      Precautions / Restrictions Precautions Precautions: None Restrictions Weight Bearing Restrictions: No   Pertinent Vitals/Pain 0/10      Mobility  Bed Mobility Bed Mobility: Supine to Sit Supine to Sit: 7: Independent Details for Bed Mobility Assistance: Independent with all Transfers Transfers: Sit to Stand;Stand to Sit Sit to Stand: 7: Independent Stand to Sit: 7: Independent Details for Transfer Assistance: Independent with all Ambulation/Gait Ambulation/Gait Assistance: 7: Independent Ambulation Distance (Feet): 200 Feet Assistive device: None Ambulation/Gait Assistance Details: no LOB, walking at baseline Gait Pattern: Step-through pattern Gait velocity: wfl Stairs: No    Shoulder Instructions     Exercises     PT Diagnosis:    PT Problem List:   PT Treatment Interventions:     PT Goals Acute Rehab PT Goals PT Goal Formulation:  (n/a)  Visit Information  Last PT Received On: 12/21/12 Assistance Needed: +1 PT/OT  Co-Evaluation/Treatment: Yes    Subjective Data  Subjective: Pt received supine in bed. Pt hearing impaired.   Prior Functioning  Home Living Lives With: Spouse Available Help at Discharge: Family;Available PRN/intermittently (wife works second shift Friday-Tuesday) Type of Home: Apartment Home Access: Level entry Home Layout: One level Bathroom Shower/Tub: Forensic scientist: Standard Bathroom Accessibility: Yes How Accessible: Accessible via walker Home Adaptive Equipment: Straight cane Prior Function Level of Independence: Independent Able to Take Stairs?: Yes Vocation: On disability Communication Communication: Deaf (hearing issues since age 85) Dominant Hand: Right    Cognition  Overall Cognitive Status: Appears within functional limits for tasks assessed/performed Arousal/Alertness: Awake/alert Orientation Level: Appears intact for tasks assessed Behavior During Session: [facility] for tasks performed    Extremity/Trunk Assessment Right Upper Extremity Assessment RUE ROM/Strength/Tone: Within functional levels RUE Sensation: WFL - Light Touch RUE Coordination: WFL - gross/fine motor Left Upper Extremity Assessment LUE ROM/Strength/Tone: Within functional levels LUE Sensation: WFL - Light Touch LUE Coordination: WFL - gross/fine motor Right Lower Extremity Assessment RLE ROM/Strength/Tone: Within functional levels Left Lower Extremity Assessment LLE ROM/Strength/Tone: Within functional levels Trunk Assessment Trunk Assessment: Normal   Balance Balance Balance Assessed: No  End of Session PT - End of Session Equipment Utilized During Treatment: Gait belt Activity Tolerance: Patient tolerated treatment well Patient left: in bed;with call bell/phone within reach Nurse Communication: Mobility status  GP     Marcene Brawn 12/21/2012, 9:20 AM Lewis Shock, PT, DPT Pager #: [phone] Office #: [phone]

## 2012-12-21 NOTE — Progress Notes (Addendum)
[facility] PROGRESS NOTE  BORIS ENGELMANN ZOX:[id number] DOB: [birth year] DOA: 12/20/2012 PCP: Charolett Bumpers, MD PCP - Dr. Herbie [place]?  Assessment/Plan: 1. Left-sided numbness and exacerbation of chronic dysarthria--TIA or small subcortical right cerebral infarction cannot be ruled out at this point. EKG SR, no acute changes. Stroke workup in progress. 2. Hearing and speaking impaired 3. Chronic diffuse prominence of the ventricular system--seen on CT head. F/u MRI, further recommendations per neurology.   Interview conducted with interpreter at bedside. Discussed plan of care. All questions answered.   Code Status: Full code Family Communication: none present Disposition Plan: home when improved  Brendia Sacks, MD  [facility] Team 4  Pager [phone] If 7PM-7AM, please contact night-coverage at [email or web address], password [facility] 12/21/2012, 8:00 AM  LOS: 1 day   Brief narrative: 51 year-old male with history of asthma and Barrett's esophagus with chronic difficulty in hearing and speaking since age 48 presents with complaints of left facial numbness difficulty seeing in the left eye with dysarthria and left upper and lower extremity weakness and numbness. Patient's symptoms started around 6 PM. His symptoms have gradually improved but his left facial numbness still persist. There is still mild weakness in the left upper and lower extremity. Patient's visual symptoms in the left eye has resolved. Patient was seen by neurologist on-call Dr. Roseanne Reno. CT head is negative for anything acute. Patient was felt not to be a candidate for TPA because his physical findings only showed minimal deficits.  Consultants:  Neurology  Procedures:  2D echo  Bilateral carotid dopplers  HPI/Subjective: Afebrile, vitals stable. Left side of face still numb, otherwise ok. No pain.  Objective: Filed Vitals:   12/20/12 2245 12/20/12 2348 12/21/12 0130 12/21/12 0344  BP: 131/92 125/80  118/77 116/74  Pulse: 82 73 76 83  Temp:  97.6 F (36.4 C) 97.7 F (36.5 C) 98 F (36.7 C)  TempSrc:  Oral Oral Oral  Resp: 18 18 18 18   SpO2: 95% 98% 98% 98%   No intake or output data in the 24 hours ending 12/21/12 0800 There were no vitals filed for this visit.  Exam:  General:  Appears calm and comfortable Eyes: PERRL, normal lids, irises  ENT: hearing impaired, normal lips  Cardiovascular: RRR, no m/r/g. No LE edema. Telemetry: SR, no arrhythmias  Respiratory: CTA bilaterally, no w/r/r. Normal respiratory effort. Abdomen: soft, ntnd Skin: no rash or induration seen on limited exam Musculoskeletal: grossly normal tone and strength BUE/BLE,  Psychiatric: grossly normal mood and affect, speech impaired Neurologic: grossly non-focal  Data Reviewed: Basic Metabolic Panel:  Lab 12/20/12 8295 12/20/12 2015  NA 142 139  K 4.2 4.0  CL 107 104  CO2 -- 23  GLUCOSE 115* 114*  BUN 13 12  CREATININE 1.00 0.83  CALCIUM -- 9.0  MG -- --  PHOS -- --   Liver Function Tests:  Lab 12/20/12 2015  AST 19  ALT 17  ALKPHOS 44  BILITOT 0.2*  PROT 6.7  ALBUMIN 3.6   CBC:  Lab 12/20/12 2039 12/20/12 2015  WBC -- 8.4  NEUTROABS -- 4.3  HGB 14.6 14.3  HCT 43.0 42.3  MCV -- 89.6  PLT -- 198   Cardiac Enzymes:  Lab 12/20/12 2018  CKTOTAL --  CKMB --  CKMBINDEX --  TROPONINI <0.30   CBG:  Lab 12/20/12 2043  GLUCAP 102*   Studies: Ct Head Wo Contrast  12/20/2012  *RADIOLOGY REPORT*  Clinical Data: Code stroke, left side weakness, blurred vision left  eye, left facial numbness  CT HEAD WITHOUT CONTRAST  Technique:  Contiguous axial images were obtained from the base of the skull through the vertex without contrast.  Comparison: 08/02/2006  Findings: Prominent ventricular system diffusely, unchanged. No midline shift or mass effect. Normal appearance of brain parenchyma otherwise identified. No intracranial hemorrhage, mass lesion or evidence of acute infarction. No  extra-axial fluid collections. Bones and sinuses unremarkable.  IMPRESSION: No acute intracranial abnormalities. Chronic diffuse prominence of the ventricular system question normal pressure hydrocephalus.  Findings called to Dr. Roseanne Reno on 12/20/2012 at 2032 hours.   Original Report Authenticated By: Ulyses Southward, M.D.     Scheduled Meds:   . sodium chloride   Intravenous STAT  . aspirin  300 mg Rectal Daily   Or  . aspirin  325 mg Oral Daily  . fluticasone  1 spray Each Nare Daily  . influenza  inactive virus vaccine  0.5 mL Intramuscular Tomorrow-1000   Continuous Infusions:   . sodium chloride 1,000 mL (12/21/12 0612)    Principal Problem:  *CVA (cerebral infarction) Active Problems:  TIA (transient ischemic attack)  History of asthma  Hearing impaired     Brendia Sacks, MD  [facility] Team 4  Pager [phone] If 7PM-7AM, please contact night-coverage at [email or web address], password [facility] 12/21/2012, 8:00 AM  LOS: 1 day   Time spent: 25 minutes

## 2012-12-22 DIAGNOSIS — H9190 Unspecified hearing loss, unspecified ear: Secondary | ICD-10-CM

## 2012-12-22 DIAGNOSIS — E785 Hyperlipidemia, unspecified: Secondary | ICD-10-CM

## 2012-12-22 DIAGNOSIS — R12 Heartburn: Secondary | ICD-10-CM

## 2012-12-22 DIAGNOSIS — G459 Transient cerebral ischemic attack, unspecified: Secondary | ICD-10-CM

## 2012-12-22 MED ORDER — CLOPIDOGREL BISULFATE 75 MG PO TABS: 75.0000 mg | ORAL_TABLET | Freq: Every day | ORAL | Status: DC

## 2012-12-22 MED ORDER — SIMVASTATIN 20 MG PO TABS: 20.0000 mg | ORAL_TABLET | Freq: Every day | ORAL | Status: DC

## 2012-12-22 NOTE — Discharge Summary (Signed)
Physician Discharge Summary  Patient ID: Nathaniel [name] MRN: [id number] DOB/AGE: 09/20/1961 51 y.o.  Admit date: 12/20/2012 Discharge date: 12/22/2012  Primary Care Physician:  Charolett Bumpers, MD  Discharge Diagnoses:    . TIA (transient ischemic attack)/ small CVA (cerebral infarction) MRI negative Hyperlipidemia Hearing-impaired GERD   Consults:  Neurology, Dr. Etheleen Mayhew  Discharge Medications:   Medication List     As of 12/22/2012 12:31 PM    STOP taking these medications         aspirin 325 MG EC tablet      TAKE these medications         albuterol 108 (90 BASE) MCG/ACT inhaler   Commonly known as: PROVENTIL HFA;VENTOLIN HFA   Inhale 2 puffs into the lungs every 6 (six) hours as needed. For wheezing      albuterol (2.5 MG/3ML) 0.083% nebulizer solution   Commonly known as: PROVENTIL   Take 3 mLs (2.5 mg total) by nebulization every 6 (six) hours as needed for wheezing.      clopidogrel 75 MG tablet   Commonly known as: PLAVIX   Take 1 tablet (75 mg total) by mouth daily with breakfast.      fluticasone 50 MCG/ACT nasal spray   Commonly known as: FLONASE   Place 1 spray into the nose Daily.      simvastatin 20 MG tablet   Commonly known as: ZOCOR   Take 1 tablet (20 mg total) by mouth daily at 6 PM.         Brief H and P: For complete details please refer to admission H and P, but in brief, 51 year-old male with history of asthma and Barrett's esophagus with chronic difficulty in hearing and speaking since age 19 presents with complaints of left facial numbness, difficulty seeing in the left eye with dysarthria and left upper and lower extremity weakness and numbness. Patient's symptoms started around 6 PM. His symptoms had gradually improved but his left facial numbness still persisted. There was still mild weakness in the left upper and lower extremity. Patient's visual symptoms in the left eye had resolved. Patient was seen by neurologist on-call  Dr. Roseanne Reno. CT head was negative for anything acute. Patient was felt not to be a candidate for TPA because his physical findings only showed minimal deficits. Patient was admitted for TIA/CVA workup.    Hospital Course:  1. Left-sided numbness and exacerbation of chronic dysarthria--patient was admitted for TIA/CVA workup. CT head was negative, MRI was obtained which was also negative for acute stroke. Discussed in detail with stroke service, Dr. Sandi Raveling patient did have left facial numbness with left-sided symptoms and may have MRI negative small CVA. Hence he was recommended to be placed on Plavix daily as patient was on aspirin prior to admission. 2-D echocardiogram showed EF of 55-60% with no source of embolus. Carotid Doppler showed no evidence of hemodynamically significant internal carotid artery stenosis. Physical therapy service recommended no PT as patient is currently at baseline. 2. Hearing and speaking impaired 3. Chronic diffuse prominence of the ventricular system--seen on CT head.      Day of Discharge BP 117/76  Pulse 86  Temp 98.1 F (36.7 C) (Oral)  Resp 19  SpO2 99%  Physical Exam: General: Alert and awake oriented x3 not in any acute distress, hearing-impaired. HEENT: anicteric sclera, pupils reactive to light and accommodation CVS: S1-S2 clear no murmur rubs or gallops Chest: clear to auscultation bilaterally, no wheezing rales or rhonchi Abdomen:  soft nontender, nondistended, normal bowel sounds, no organomegaly Extremities: no cyanosis, clubbing or edema noted bilaterally Neuro: Cranial nerves II-XII intact, no focal neurological deficits, speech impaired   The results of significant diagnostics from this hospitalization (including imaging, microbiology, ancillary and laboratory) are listed below for reference.    LAB RESULTS: Basic Metabolic Panel:  Lab 12/21/12 1610 12/20/12 2039 12/20/12 2015  NA 140 142 --  K 4.3 4.2 --  CL 105 107 --  CO2 24 -- 23   GLUCOSE 96 115* --  BUN 11 13 --  CREATININE 0.79 1.00 --  CALCIUM 8.7 -- 9.0  MG -- -- --  PHOS -- -- --   Liver Function Tests:  Lab 12/21/12 0700 12/20/12 2015  AST 20 19  ALT 17 17  ALKPHOS 45 44  BILITOT 0.3 0.2*  PROT 6.4 6.7  ALBUMIN 3.4* 3.6   No results found for this basename: LIPASE:2,AMYLASE:2 in the last 168 hours No results found for this basename: AMMONIA:2 in the last 168 hours CBC:  Lab 12/21/12 0700 12/20/12 2039 12/20/12 2015  WBC 6.2 -- 8.4  NEUTROABS -- -- 4.3  HGB 13.8 14.6 --  HCT 41.9 43.0 --  MCV 90.3 -- --  PLT 183 -- 198   Cardiac Enzymes:  Lab 12/20/12 2018  CKTOTAL --  CKMB --  CKMBINDEX --  TROPONINI <0.30   BNP: No components found with this basename: POCBNP:2 CBG:  Lab 12/20/12 2043  GLUCAP 102*    Significant Diagnostic Studies:  Dg Chest 2 View  12/21/2012  *RADIOLOGY REPORT*  Clinical Data: Stroke  CHEST - 2 VIEW  Comparison: 03/31/2012  Findings: Lungs clear.  No effusion.  Heart size normal.  Minimal spurring in the lower thoracic spine.  IMPRESSION:  1.  No acute disease   Original Report Authenticated By: D. Andria Rhein, MD    Ct Head Wo Contrast  12/20/2012  *RADIOLOGY REPORT*  Clinical Data: Code stroke, left side weakness, blurred vision left eye, left facial numbness  CT HEAD WITHOUT CONTRAST  Technique:  Contiguous axial images were obtained from the base of the skull through the vertex without contrast.  Comparison: 08/02/2006  Findings: Prominent ventricular system diffusely, unchanged. No midline shift or mass effect. Normal appearance of brain parenchyma otherwise identified. No intracranial hemorrhage, mass lesion or evidence of acute infarction. No extra-axial fluid collections. Bones and sinuses unremarkable.  IMPRESSION: No acute intracranial abnormalities. Chronic diffuse prominence of the ventricular system question normal pressure hydrocephalus.  Findings called to Dr. Roseanne Reno on 12/20/2012 at 2032 hours.    Original Report Authenticated By: Ulyses Southward, M.D.    Mr Brain Wo Contrast  12/21/2012  *RADIOLOGY REPORT*  Clinical Data:  51 year old male with acute onset left face numbness, left vision changes, dysarthria, left extremity weakness and numbness.  Comparison: Brain MRI 08/11/2012, 10/09/2007.  MRI HEAD WITHOUT CONTRAST  Technique: Multiplanar, multiecho pulse sequences of the brain and surrounding structures were obtained according to standard protocol without intravenous contrast.  Findings: Chronic ventriculomegaly is stable since 2008.  No transependymal edema.  No restricted diffusion to suggest acute infarction.  No midline shift, mass effect, evidence of mass lesion, extra-axial collection or acute intracranial hemorrhage.  Cervicomedullary junction and pituitary are within normal limits.  Negative visualized cervical spine.  Normal bone marrow signal. Major intracranial vascular flow voids are stable.  Mild scattered nonspecific cerebral white matter small foci of T2 and FLAIR hyperintensity has not significantly changed since 2008.  Stable visualized internal auditory  structures.  Visualized orbit soft tissues are within normal limits.  Negative scalp soft tissues. Visualized paranasal sinuses and mastoids are clear.  IMPRESSION: 1. No acute intracranial abnormality. 2.  Stable noncontrast MRI appearance of the brain since 2008. Chronic ventriculomegaly. 3.  MRA findings are below.  MRA HEAD WITHOUT CONTRAST  Technique: Angiographic images of the Circle of Willis were obtained using MRA technique without  intravenous contrast.  Findings: Antegrade flow in the posterior circulation.  Codominant distal vertebral arteries.  Bilateral PICA flow is evident.  Patent vertebrobasilar junction.  Dominant left AICA.  There is mild irregularity of the proximal basilar artery.  No basilar stenosis. SCA and PCA origins are within normal limits.  Posterior communicating arteries are diminutive or absent.  Bilateral  PCA branches are within normal limits.  Antegrade flow in both ICA siphons.  Minimal ICA irregularity.  No ICA stenosis.  Ophthalmic artery origins are normal.  Carotid termini are normal.  MCA and ACA origins are within normal limits. Diminutive anterior communicating artery.  Visualized ACA branches are within normal limits.  Visualized bilateral MCA branches are normal.  IMPRESSION: Negative intracranial MRA.   Original Report Authenticated By: Erskine Speed, M.D.    Mr Mra Head/brain Wo Cm  12/21/2012  *RADIOLOGY REPORT*  Clinical Data:  51 year old male with acute onset left face numbness, left vision changes, dysarthria, left extremity weakness and numbness.  Comparison: Brain MRI 08/11/2012, 10/09/2007.  MRI HEAD WITHOUT CONTRAST  Technique: Multiplanar, multiecho pulse sequences of the brain and surrounding structures were obtained according to standard protocol without intravenous contrast.  Findings: Chronic ventriculomegaly is stable since 2008.  No transependymal edema.  No restricted diffusion to suggest acute infarction.  No midline shift, mass effect, evidence of mass lesion, extra-axial collection or acute intracranial hemorrhage.  Cervicomedullary junction and pituitary are within normal limits.  Negative visualized cervical spine.  Normal bone marrow signal. Major intracranial vascular flow voids are stable.  Mild scattered nonspecific cerebral white matter small foci of T2 and FLAIR hyperintensity has not significantly changed since 2008.  Stable visualized internal auditory structures.  Visualized orbit soft tissues are within normal limits.  Negative scalp soft tissues. Visualized paranasal sinuses and mastoids are clear.  IMPRESSION: 1. No acute intracranial abnormality. 2.  Stable noncontrast MRI appearance of the brain since 2008. Chronic ventriculomegaly. 3.  MRA findings are below.  MRA HEAD WITHOUT CONTRAST  Technique: Angiographic images of the Circle of Willis were obtained using MRA  technique without  intravenous contrast.  Findings: Antegrade flow in the posterior circulation.  Codominant distal vertebral arteries.  Bilateral PICA flow is evident.  Patent vertebrobasilar junction.  Dominant left AICA.  There is mild irregularity of the proximal basilar artery.  No basilar stenosis. SCA and PCA origins are within normal limits.  Posterior communicating arteries are diminutive or absent.  Bilateral PCA branches are within normal limits.  Antegrade flow in both ICA siphons.  Minimal ICA irregularity.  No ICA stenosis.  Ophthalmic artery origins are normal.  Carotid termini are normal.  MCA and ACA origins are within normal limits. Diminutive anterior communicating artery.  Visualized ACA branches are within normal limits.  Visualized bilateral MCA branches are normal.  IMPRESSION: Negative intracranial MRA.   Original Report Authenticated By: Erskine Speed, M.D.      Disposition and Follow-up:     Discharge Orders    Future Orders Please Complete By Expires   Diet - low sodium heart healthy  Increase activity slowly          DISPOSITION: Home DIET: Heart healthy diet ACTIVITY: As tolerated   DISCHARGE FOLLOW-UP Follow-up Information    Follow up with Charolett Bumpers, MD. Schedule an appointment as soon as possible for a visit in 10 days. (for hospital follow-up )    Contact information:   [address] [place] [address]       Follow up with Gates Rigg, MD. Schedule an appointment as soon as possible for a visit in 2 months. (for stroke follow-up )    Contact information:   [address], [address] [facility] [place] [address] [phone]          Time spent on Discharge: 35 minutes  Signed:   Deklyn Trachtenberg M.D. [facility] 12/22/2012, 12:31 PM Pager: [phone]

## 2012-12-22 NOTE — Progress Notes (Signed)
UR COMPLETED  

## 2012-12-22 NOTE — Progress Notes (Signed)
Stroke Team Progress Note  HISTORY Nathaniel [name] is an 51 y.o. male with a history of dyslipidemia, syncope, Barrett's esophagus, hearing loss and asthma, brought to the emergency room 12/20/2012 in code stroke status with new onset numbness involving his left side as well as complaint of left-sided weakness. Patient also indicated that speech was more slurred beyond baseline dysarthria, presumably associated with chronic hearing loss. Has no previous history of stroke nor TIA. Patient has been on aspirin 325 mg per day. CT scan showed chronic prominence of the ventricular system, but otherwise unremarkable, including no acute changes. NIH stroke score was 2 for dysarthria and subjective sensory deficit on the left. Code stroke was canceled. Patient was not a TPA candidate secondary to minimal deficits. He was admitted for further evaluation and treatment.  SUBJECTIVE Patient up at bedside. States left sided weakness has totally resolved. He reports he took half tab hydrocodone last Thursday (to explain the positive UDS).  OBJECTIVE Most recent Vital Signs: Filed Vitals:   12/21/12 2153 12/22/12 0222 12/22/12 0610 12/22/12 1040  BP: 130/78 128/79 123/69 117/76  Pulse: 77 92 80 86  Temp: 98.2 F (36.8 C) 98.5 F (36.9 C) 98.1 F (36.7 C) 98.1 F (36.7 C)  TempSrc: Oral Oral Oral Oral  Resp: 20 20 20 19   SpO2: 99% 98% 98% 99%   CBG (last 3)   Basename 12/20/12 2043  GLUCAP 102*    IV Fluid Intake:     MEDICATIONS     . aspirin  300 mg Rectal Daily   Or  . aspirin  325 mg Oral Daily  . fluticasone  1 spray Each Nare Daily  . influenza  inactive virus vaccine  0.5 mL Intramuscular Tomorrow-1000  . simvastatin  20 mg Oral q1800   PRN:  acetaminophen, albuterol, albuterol, senna-docusate  Diet:  Cardiac thin liquids Activity:  OOB with assistance DVT Prophylaxis:  SCDs   CLINICALLY SIGNIFICANT STUDIES Basic Metabolic Panel:   Lab 12/21/12 0700 12/20/12 2039 12/20/12 2015   NA 140 142 --  K 4.3 4.2 --  CL 105 107 --  CO2 24 -- 23  GLUCOSE 96 115* --  BUN 11 13 --  CREATININE 0.79 1.00 --  CALCIUM 8.7 -- 9.0  MG -- -- --  PHOS -- -- --   Liver Function Tests:   Lab 12/21/12 0700 12/20/12 2015  AST 20 19  ALT 17 17  ALKPHOS 45 44  BILITOT 0.3 0.2*  PROT 6.4 6.7  ALBUMIN 3.4* 3.6   CBC:   Lab 12/21/12 0700 12/20/12 2039 12/20/12 2015  WBC 6.2 -- 8.4  NEUTROABS -- -- 4.3  HGB 13.8 14.6 --  HCT 41.9 43.0 --  MCV 90.3 -- 89.6  PLT 183 -- 198   Coagulation:   Lab 12/20/12 2015  LABPROT 12.5  INR 0.94   Cardiac Enzymes:   Lab 12/20/12 2018  CKTOTAL --  CKMB --  CKMBINDEX --  TROPONINI <0.30   Urinalysis: No results found for this basename: COLORURINE:2,APPERANCEUR:2,LABSPEC:2,PHURINE:2,GLUCOSEU:2,HGBUR:2,BILIRUBINUR:2,KETONESUR:2,PROTEINUR:2,UROBILINOGEN:2,NITRITE:2,LEUKOCYTESUR:2 in the last 168 hours Lipid Panel    Component Value Date/Time   CHOL 180 12/21/2012 0700   TRIG 160* 12/21/2012 0700   HDL 36* 12/21/2012 0700   CHOLHDL 5.0 12/21/2012 0700   VLDL 32 12/21/2012 0700   LDLCALC 112* 12/21/2012 0700   HgbA1C  Lab Results  Component Value Date   HGBA1C 5.9* 12/21/2012    Urine Drug Screen:     Component Value Date/Time   LABOPIA POSITIVE*  12/20/2012 2152   COCAINSCRNUR NONE DETECTED 12/20/2012 2152   LABBENZ NONE DETECTED 12/20/2012 2152   AMPHETMU NONE DETECTED 12/20/2012 2152   THCU NONE DETECTED 12/20/2012 2152   LABBARB NONE DETECTED 12/20/2012 2152    Alcohol Level: No results found for this basename: ETH:2 in the last 168 hours  CT of the brain  12/20/2012 No acute intracranial abnormalities. Chronic diffuse prominence of the ventricular system question normal pressure hydrocephalus.  MRI of the brain  12/21/2012  1. No acute intracranial abnormality. 2.  Stable noncontrast MRI appearance of the brain since 2008. Chronic ventriculomegaly.  MRA of the brain  12/21/2012 Negative intracranial MRA  2D  Echocardiogram  EF 55-60% with no source of embolus.   Carotid Doppler  No evidence of hemodynamically significant internal carotid artery stenosis. Vertebral artery flow is antegrade.   CXR  1. No acute disease  EKG  normal EKG, normal sinus rhythm.   Therapy Recommendations PT - none, OT - none  GENERAL EXAM: Patient is in no distress  CARDIOVASCULAR: Regular rate and rhythm, no murmurs, no carotid bruits  NEUROLOGIC: MENTAL STATUS: awake, alert, language fluent, comprehension intact, naming intact; ABLE TO READ LIPS. ABLE TO SPEAK. WE COMMUNICATE VIA GESTURE, VERBAL LANGUAGE, AND WRITING ON PAPER (SIGN LANGUAGE INTERPRETER NOT AVAILABLE). CRANIAL NERVE: pupils equal and reactive to light, visual fields full to confrontation, extraocular muscles intact, no nystagmus, facial sensation and strength symmetric, uvula midline, shoulder shrug symmetric, tongue midline. MOTOR: normal bulk and tone, full strength in the BUE, BLE SENSORY: SLIGHTLY DECR IN LEFT FACE. COORDINATION: finger-nose-finger NORMAL. REFLEXES: deep tendon reflexes present and symmetric   ASSESSMENT Mr. Nathaniel [name] is a 51 y.o. male presenting with left sided numbness and weakness. Imaging shows no acute stroke and chronic ventriculomegaly. Dx is right brain TIA.  Work up completed. On aspirin 325 mg orally every day prior to admission. Now on aspirin 325 mg orally every day for secondary stroke prevention. Patient with resultant left facial numbness which has now resolved.  Hyperlipidemia, LDL 112, not on statin PTA, now on zocor, goal LDL < 100 [place] UDS positive for opiates. Took hydrocodone for pain day of admission  Hospital day # 2  TREATMENT/PLAN  Change to  clopidogrel 75 mg orally every day for secondary stroke prevention   Continue statin  Ok for discharge from stroke standpoint Stroke Service will sign off. Follow up in Stroke Clinic, in 2 months.  [name], MSN, RN, ANVP-BC, ANP-BC,  [facility] Pager: [phone] 12/22/2012 11:34 AM  I have personally obtained a history, examined the patient, evaluated imaging results, and formulated the assessment and plan of care. I agree with the above.   [facility] - Stroke Team Joycelyn Schmid, MD 12/22/2012, 11:34 AM   Please refer to [email or web address] for on-call Stroke MD

## 2013-02-18 ENCOUNTER — Other Ambulatory Visit: Payer: Self-pay | Admitting: Orthopedic Surgery

## 2013-02-27 DIAGNOSIS — M94262 Chondromalacia, left knee: Secondary | ICD-10-CM

## 2013-02-27 HISTORY — DX: Chondromalacia, left knee: M94.262

## 2013-03-01 ENCOUNTER — Encounter (HOSPITAL_BASED_OUTPATIENT_CLINIC_OR_DEPARTMENT_OTHER): Payer: Self-pay | Admitting: *Deleted

## 2013-03-02 NOTE — H&P (Signed)
Nathaniel [name] is an 52 y.o. male.   Chief Complaint: Left knee pain  HPI: Nathaniel [name] is here for a followup of his left knee pain after a fall approximately 2 months ago.  He had a left knee arthroscopic meniscectomy in April of 2013.  The knee healed well after the surgery and was relatively pain-free until he fell and twisted the knee hearing a pop as he hit the ground two months ago.  His pain has been persistent in that knee since that time and has not gotten any better.  The pain is incrased when walking and relieved with rest.  He denies any locking of his knee although the range of motion at his is limited due to pain.  He takes no medication for the pain as he feels it is a temporary fix and prefers a more permanent solution.  On that same line of thinking he is really not eager to try steroid injections due to previous failures of this intervention as well as some side effects has had from the shots.  When he was seen here on 01/05/2013 an MRI was ordered to determine whether there was a meniscal tear or not.  The MRI showed an intact meniscus with some chondromalacia of the patella.  The patient is deaf and requires a Nurse, learning disability.  Past Medical History  Diagnosis Date  . Deaf     Call [phone] for patient's sign language interpreter.   . Barrett's esophagus     states "minor"  . Asthma     prn inhaler  . TIA (transient ischemic attack) 12/20/2012    no current deficits  . Seasonal allergies   . High cholesterol   . Heartburn     occasional, related to certain foods  . Arthritis     right knee  . 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 Bunionectomy Left 2012  . Knee arthroscopy Left 06/02/2012  . Knee arthroscopy Right fall 2013  . Inguinal hernia repair    . Cardiac catheterization  06/24/2011    Family History  Problem Relation Age of Onset  . Ovarian cancer Mother   . Coronary artery disease Father    Social  History:  reports that he has never smoked. He has never used smokeless tobacco. He reports that he does not drink alcohol or use illicit drugs.  Allergies:  Allergies  Allergen Reactions  . Simvastatin Other (See Comments)    DEPRESSION, VISUAL DISTURBANCE(FLASHING LIGHTS)  . Fish Allergy Rash    SOFTSHELL FISH    No prescriptions prior to admission    No results found for this or any previous visit (from the past 48 hour(s)). No results found.  Review of Systems  Constitutional: Negative.   HENT: Positive for hearing loss.   Eyes: Negative.   Respiratory: Negative.   Cardiovascular: Negative.   Gastrointestinal: Negative.   Genitourinary: Negative.   Skin: Negative.   Neurological: Negative.   Endo/Heme/Allergies: Negative.   Psychiatric/Behavioral: Negative.     Height 5\' 11"  (1.803 m), weight 75.751 kg (167 lb). Physical Exam  Constitutional: He is oriented to person, place, and time. He appears well-developed and well-nourished.  HENT:  Head: Normocephalic.  Eyes: Pupils are equal, round, and reactive to light.  Cardiovascular: Normal heart sounds.   Respiratory: Breath sounds normal.  GI: Bowel sounds are normal.  Musculoskeletal:       Left knee: Tenderness found. Medial joint line  and lateral joint line tenderness noted.  Neurological: He is alert and oriented to person, place, and time.       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  Plan: Cortisone injection and oral NSAIDs were discussed with the patient, but he prefers arthroscopic intervention.  He is aware of the risks and benefits which were discussed with him in detail today.     WILLIAMS,JENNIFER M. 03/02/2013, 8:41 AM

## 2013-03-03 ENCOUNTER — Ambulatory Visit (HOSPITAL_BASED_OUTPATIENT_CLINIC_OR_DEPARTMENT_OTHER)
Admission: RE | Admit: 2013-03-03 | Discharge: 2013-03-03 | Disposition: A | Discharge status: Home / Self Care | Payer: PRIVATE HEALTH INSURANCE | Source: Ambulatory Visit | Attending: Orthopedic Surgery | Admitting: Orthopedic Surgery

## 2013-03-03 ENCOUNTER — Encounter (HOSPITAL_BASED_OUTPATIENT_CLINIC_OR_DEPARTMENT_OTHER)
Admission: RE | Disposition: A | Discharge status: Home / Self Care | Payer: Self-pay | Source: Ambulatory Visit | Attending: Orthopedic Surgery

## 2013-03-03 ENCOUNTER — Encounter (HOSPITAL_BASED_OUTPATIENT_CLINIC_OR_DEPARTMENT_OTHER): Payer: Self-pay | Admitting: *Deleted

## 2013-03-03 ENCOUNTER — Ambulatory Visit (HOSPITAL_BASED_OUTPATIENT_CLINIC_OR_DEPARTMENT_OTHER): Payer: PRIVATE HEALTH INSURANCE | Admitting: *Deleted

## 2013-03-03 DIAGNOSIS — Z91013 Allergy to seafood: Secondary | ICD-10-CM | POA: Insufficient documentation

## 2013-03-03 DIAGNOSIS — J45909 Unspecified asthma, uncomplicated: Secondary | ICD-10-CM | POA: Insufficient documentation

## 2013-03-03 DIAGNOSIS — M129 Arthropathy, unspecified: Secondary | ICD-10-CM | POA: Insufficient documentation

## 2013-03-03 DIAGNOSIS — Z888 Allergy status to other drugs, medicaments and biological substances status: Secondary | ICD-10-CM | POA: Insufficient documentation

## 2013-03-03 DIAGNOSIS — K2270 Barrett's esophagus without dysplasia: Secondary | ICD-10-CM | POA: Insufficient documentation

## 2013-03-03 DIAGNOSIS — H9190 Unspecified hearing loss, unspecified ear: Secondary | ICD-10-CM | POA: Insufficient documentation

## 2013-03-03 DIAGNOSIS — M2240 Chondromalacia patellae, unspecified knee: Secondary | ICD-10-CM | POA: Insufficient documentation

## 2013-03-03 DIAGNOSIS — M1712 Unilateral primary osteoarthritis, left knee: Secondary | ICD-10-CM

## 2013-03-03 DIAGNOSIS — Z8673 Personal history of transient ischemic attack (TIA), and cerebral infarction without residual deficits: Secondary | ICD-10-CM | POA: Insufficient documentation

## 2013-03-03 DIAGNOSIS — E7800 Pure hypercholesterolemia, unspecified: Secondary | ICD-10-CM | POA: Insufficient documentation

## 2013-03-03 HISTORY — DX: Pure hypercholesterolemia, unspecified: E78.00

## 2013-03-03 HISTORY — DX: Other seasonal allergic rhinitis: J30.2

## 2013-03-03 HISTORY — DX: Chondromalacia, left knee: M94.262

## 2013-03-03 HISTORY — DX: Heartburn: R12

## 2013-03-03 HISTORY — PX: KNEE ARTHROSCOPY: SHX127

## 2013-03-03 HISTORY — DX: Transient cerebral ischemic attack, unspecified: G45.9

## 2013-03-03 SURGERY — ARTHROSCOPY, KNEE
Anesthesia: General | Site: Knee | Laterality: Left | Wound class: Clean

## 2013-03-03 MED ORDER — LACTATED RINGERS IV SOLN
INTRAVENOUS | Status: DC
  Administered 2013-03-03: 12:00:00 via INTRAVENOUS

## 2013-03-03 MED ORDER — MIDAZOLAM HCL 2 MG/ML PO SYRP: 12.0000 mg | ORAL_SOLUTION | Freq: Once | ORAL | Status: DC | PRN

## 2013-03-03 MED ORDER — ACETAMINOPHEN 10 MG/ML IV SOLN
1000.0000 mg | Freq: Once | INTRAVENOUS | Status: AC
  Administered 2013-03-03: 1000 mg via INTRAVENOUS

## 2013-03-03 MED ORDER — FENTANYL CITRATE 0.05 MG/ML IJ SOLN: 50.0000 ug | INTRAMUSCULAR | Status: DC | PRN

## 2013-03-03 MED ORDER — ONDANSETRON HCL 4 MG/2ML IJ SOLN
INTRAMUSCULAR | Status: DC | PRN
  Administered 2013-03-03: 4 mg via INTRAVENOUS

## 2013-03-03 MED ORDER — HYDROMORPHONE HCL PF 1 MG/ML IJ SOLN
0.2500 mg | INTRAMUSCULAR | Status: DC | PRN
  Administered 2013-03-03 (×3): 0.5 mg via INTRAVENOUS

## 2013-03-03 MED ORDER — BUPIVACAINE-EPINEPHRINE 0.5% -1:200000 IJ SOLN
INTRAMUSCULAR | Status: DC | PRN
  Administered 2013-03-03: 20 mL

## 2013-03-03 MED ORDER — MIDAZOLAM HCL 2 MG/2ML IJ SOLN: 1.0000 mg | INTRAMUSCULAR | Status: DC | PRN

## 2013-03-03 MED ORDER — LIDOCAINE HCL (CARDIAC) 20 MG/ML IV SOLN
INTRAVENOUS | Status: DC | PRN
  Administered 2013-03-03: 40 mg via INTRAVENOUS

## 2013-03-03 MED ORDER — DEXAMETHASONE SODIUM PHOSPHATE 4 MG/ML IJ SOLN
INTRAMUSCULAR | Status: DC | PRN
  Administered 2013-03-03: 10 mg via INTRAVENOUS

## 2013-03-03 MED ORDER — FENTANYL CITRATE 0.05 MG/ML IJ SOLN
INTRAMUSCULAR | Status: DC | PRN
  Administered 2013-03-03 (×2): 50 ug via INTRAVENOUS

## 2013-03-03 MED ORDER — HYDROCODONE-ACETAMINOPHEN 10-325 MG PO TABS: 1.0000 | ORAL_TABLET | ORAL | Status: DC | PRN

## 2013-03-03 MED ORDER — PROPOFOL 10 MG/ML IV BOLUS
INTRAVENOUS | Status: DC | PRN
  Administered 2013-03-03: 150 mg via INTRAVENOUS

## 2013-03-03 MED ORDER — OXYCODONE HCL 5 MG/5ML PO SOLN: 5.0000 mg | Freq: Once | ORAL | Status: AC | PRN

## 2013-03-03 MED ORDER — LACTATED RINGERS IV SOLN
INTRAVENOUS | Status: DC | PRN
  Administered 2013-03-03: 11:00:00 via INTRAVENOUS

## 2013-03-03 MED ORDER — SODIUM CHLORIDE 0.9 % IR SOLN
Status: DC | PRN
  Administered 2013-03-03: 12:00:00

## 2013-03-03 MED ORDER — CEFAZOLIN SODIUM-DEXTROSE 2-3 GM-% IV SOLR
INTRAVENOUS | Status: DC | PRN
  Administered 2013-03-03: 2 g via INTRAVENOUS

## 2013-03-03 MED ORDER — OXYCODONE HCL 5 MG PO TABS
5.0000 mg | ORAL_TABLET | Freq: Once | ORAL | Status: AC | PRN
  Administered 2013-03-03: 5 mg via ORAL

## 2013-03-03 MED ORDER — MIDAZOLAM HCL 5 MG/5ML IJ SOLN
INTRAMUSCULAR | Status: DC | PRN
  Administered 2013-03-03: 2 mg via INTRAVENOUS

## 2013-03-03 SURGICAL SUPPLY — 41 items
BANDAGE ELASTIC 6 VELCRO ST LF (GAUZE/BANDAGES/DRESSINGS) ×2 IMPLANT
BLADE 4.2CUDA (BLADE) IMPLANT
BLADE CUTTER GATOR 3.5 (BLADE) ×1 IMPLANT
BLADE GREAT WHITE 4.2 (BLADE) IMPLANT
CANISTER OMNI JUG 16 LITER (MISCELLANEOUS) IMPLANT
CANISTER SUCTION 2500CC (MISCELLANEOUS) IMPLANT
CHLORAPREP W/TINT 26ML (MISCELLANEOUS) ×2 IMPLANT
CLOTH BEACON ORANGE TIMEOUT ST (SAFETY) ×2 IMPLANT
DRAPE ARTHROSCOPY W/POUCH 114 (DRAPES) ×2 IMPLANT
ELECT MENISCUS 165MM 90D (ELECTRODE) IMPLANT
ELECT REM PT RETURN 9FT ADLT (ELECTROSURGICAL)
ELECTRODE REM PT RTRN 9FT ADLT (ELECTROSURGICAL) IMPLANT
GAUZE XEROFORM 1X8 LF (GAUZE/BANDAGES/DRESSINGS) ×2 IMPLANT
GLOVE BIO SURGEON STRL SZ 6.5 (GLOVE) ×1 IMPLANT
GLOVE BIO SURGEON STRL SZ7 (GLOVE) ×2 IMPLANT
GLOVE BIO SURGEON STRL SZ7.5 (GLOVE) ×2 IMPLANT
GLOVE BIOGEL PI IND STRL 7.0 (GLOVE) ×1 IMPLANT
GLOVE BIOGEL PI IND STRL 8 (GLOVE) ×1 IMPLANT
GLOVE BIOGEL PI INDICATOR 7.0 (GLOVE) ×2
GLOVE BIOGEL PI INDICATOR 8 (GLOVE) ×1
GLOVE EXAM NITRILE MD LF STRL (GLOVE) ×1 IMPLANT
GOWN PREVENTION PLUS XLARGE (GOWN DISPOSABLE) ×2 IMPLANT
IV NS IRRIG 3000ML ARTHROMATIC (IV SOLUTION) IMPLANT
KNEE WRAP E Z 3 GEL PACK (MISCELLANEOUS) ×2 IMPLANT
NDL FILTER BLUNT 18X1 1/2 (NEEDLE) ×1 IMPLANT
NDL SAFETY ECLIPSE 18X1.5 (NEEDLE) IMPLANT
NEEDLE FILTER BLUNT 18X 1/2SAF (NEEDLE) ×1
NEEDLE FILTER BLUNT 18X1 1/2 (NEEDLE) ×1 IMPLANT
NEEDLE HYPO 18GX1.5 SHARP (NEEDLE) ×2
PACK ARTHROSCOPY DSU (CUSTOM PROCEDURE TRAY) ×2 IMPLANT
PACK BASIN DAY SURGERY FS (CUSTOM PROCEDURE TRAY) ×2 IMPLANT
PENCIL BUTTON HOLSTER BLD 10FT (ELECTRODE) IMPLANT
SET ARTHROSCOPY TUBING (MISCELLANEOUS) ×2
SET ARTHROSCOPY TUBING LN (MISCELLANEOUS) ×1 IMPLANT
SLEEVE SCD COMPRESS KNEE MED (MISCELLANEOUS) IMPLANT
SPONGE GAUZE 4X4 12PLY (GAUZE/BANDAGES/DRESSINGS) ×2 IMPLANT
SYR 3ML 18GX1 1/2 (SYRINGE) ×1 IMPLANT
SYR 5ML LL (SYRINGE) ×2 IMPLANT
TOWEL OR 17X24 6PK STRL BLUE (TOWEL DISPOSABLE) ×2 IMPLANT
WAND STAR VAC 90 (SURGICAL WAND) IMPLANT
WATER STERILE IRR 1000ML POUR (IV SOLUTION) ×2 IMPLANT

## 2013-03-03 NOTE — Op Note (Signed)
Pre-Op Dx: Left knee recurrent chondromalacia  Postop Dx: Left knee chondromalacia patella lateral facet grade 3 with flap tears, lateral tibial plateau grade 3 with flap tears   Procedure: Left knee arthroscopic debridement chondromalacia with flap tears lateral tibial plateau and lateral facet of patella  Surgeon: Feliberto Gottron. Turner Daniels M.D.  Assist: Shirl Harris PA-C  Anes: General LMA  EBL: Minimal  Fluids: 800 cc   Indications: Patient underwent knee arthroscopy about a year ago and did well until a fall a few months ago and now has recurrent catching popping and pain in his left knee with intermittent effusions.. Pt has failed conservative treatment with anti-inflammatory medicines, physical therapy, and modified activites but did get good temporarily from an intra-articular cortisone injection. Pain has recurred and patient desires elective arthroscopic evaluation and treatment of knee. Risks and benefits of surgery have been discussed and questions answered.  Procedure: Patient identified by arm band and taken to the operating room at the day surgery Center. The appropriate anesthetic monitors were attached, and General LMA anesthesia was induced without difficulty. Lateral post was applied to the table and the lower extremity was prepped and draped in usual sterile fashion from the ankle to the midthigh. Time out procedure was performed. We began the operation by making standard inferior lateral and inferior medial peripatellar portals with a #11 blade allowing introduction of the arthroscope through the inferior lateral portal and the out flow to the inferior medial portal. Pump pressure was set at 100 mmHg and diagnostic arthroscopy  revealed grade 3 chondromalacia of the lateral facet of the patella with flap tears, this was debrided back to a stable margin with a Gator 3.5 sucker shaver. The medial compartment was in excellent condition as were the cruciate ligaments. On the lateral side the  patient had recurrent chondromalacia grade 3 with flap tears the lateral tibial plateau centrally and this was likewise debrided. The gutters were cleared medially and laterally.. The knee was irrigated out normal saline solution. A dressing of xerofoam 4 x 4 dressing sponges, web roll and an Ace wrap was applied. The patient was awakened extubated and taken to the recovery without difficulty.    Signed: Nestor Lewandowsky, MD

## 2013-03-03 NOTE — Anesthesia Postprocedure Evaluation (Signed)
  Anesthesia Post-op Note  Patient: Nathaniel [name]  Procedure(s) Performed: Procedure(s): ARTHROSCOPY KNEE WITH CHONDROPLASTY PATELLA  AND LATERAL TIBIAL PLATEAU (Left)  Patient Location: PACU  Anesthesia Type:General  Level of Consciousness: awake, alert  and oriented  Airway and Oxygen Therapy: Patient Spontanous Breathing  Post-op Pain: mild  Post-op Assessment: Post-op Vital signs reviewed, Patient's Cardiovascular Status Stable, Respiratory Function Stable, Patent Airway and No signs of Nausea or vomiting  Post-op Vital Signs: Reviewed and stable  Complications: No apparent anesthesia complications

## 2013-03-03 NOTE — Interval H&P Note (Signed)
History and Physical Interval Note:  03/03/2013 11:37 AM  Nathaniel [name]  has presented today for surgery, with the diagnosis of LEFT KNEE CHONDRAMALCIA PATELLA  The various methods of treatment have been discussed with the patient and family. After consideration of risks, benefits and other options for treatment, the patient has consented to  Procedure(s): ARTHROSCOPY KNEE (Left) as a surgical intervention .  The patient's history has been reviewed, patient examined, no change in status, stable for surgery.  I have reviewed the patient's chart and labs.  Questions were answered to the patient's satisfaction.     Nestor Lewandowsky

## 2013-03-03 NOTE — Anesthesia Preprocedure Evaluation (Addendum)
Anesthesia Evaluation  Patient identified by MRN, date of birth, ID band Patient awake    Reviewed: Allergy & Precautions, H&P , NPO status , Patient's Chart, lab work & pertinent test results  Airway Mallampati: III TM Distance: >3 FB Neck ROM: Full    Dental no notable dental hx. (+) Teeth Intact and Dental Advisory Given   Pulmonary asthma ,  breath sounds clear to auscultation  Pulmonary exam normal       Cardiovascular negative cardio ROS  Rhythm:Regular Rate:Normal     Neuro/Psych TIAnegative psych ROS   GI/Hepatic negative GI ROS, Neg liver ROS,   Endo/Other  negative endocrine ROS  Renal/GU negative Renal ROS  negative genitourinary   Musculoskeletal   Abdominal   Peds  Hematology negative hematology ROS (+)   Anesthesia Other Findings   Reproductive/Obstetrics negative OB ROS                           Anesthesia Physical Anesthesia Plan  ASA: II  Anesthesia Plan: General   Post-op Pain Management:    Induction: Intravenous  Airway Management Planned: LMA  Additional Equipment:   Intra-op Plan:   Post-operative Plan: Extubation in OR  Informed Consent: I have reviewed the patients History and Physical, chart, labs and discussed the procedure including the risks, benefits and alternatives for the proposed anesthesia with the patient or authorized representative who has indicated his/her understanding and acceptance.   Dental advisory given  Plan Discussed with: CRNA and Surgeon  Anesthesia Plan Comments:         Anesthesia Quick Evaluation

## 2013-03-03 NOTE — Anesthesia Procedure Notes (Signed)
Procedure Name: LMA Insertion Date/Time: 03/03/2013 11:51 AM Performed by: Meyer Russel Pre-anesthesia Checklist: Patient identified, Emergency Drugs available, Suction available and Patient being monitored Patient Re-evaluated:Patient Re-evaluated prior to inductionOxygen Delivery Method: Circle system utilized Preoxygenation: Pre-oxygenation with 100% oxygen Intubation Type: IV induction Ventilation: Mask ventilation without difficulty LMA: LMA inserted LMA Size: 5.0 Number of attempts: 1 Placement Confirmation: breath sounds checked- equal and bilateral and positive ETCO2 Tube secured with: Tape Dental Injury: Teeth and Oropharynx as per pre-operative assessment

## 2013-03-03 NOTE — Transfer of Care (Signed)
Immediate Anesthesia Transfer of Care Note  Patient: Nathaniel [name]  Procedure(s) Performed: Procedure(s): ARTHROSCOPY KNEE WITH CHONDROPLASTY PATELLA  AND LATERAL TIBIAL PLATEAU (Left)  Patient Location: PACU  Anesthesia Type:General  Level of Consciousness: awake, alert  and oriented  Airway & Oxygen Therapy: Patient Spontanous Breathing and Patient connected to face mask oxygen  Post-op Assessment: Report given to PACU RN, Post -op Vital signs reviewed and stable and Patient moving all extremities  Post vital signs: Reviewed and stable  Complications: No apparent anesthesia complications

## 2013-03-04 ENCOUNTER — Encounter (HOSPITAL_BASED_OUTPATIENT_CLINIC_OR_DEPARTMENT_OTHER): Payer: Self-pay | Admitting: Orthopedic Surgery

## 2013-04-22 ENCOUNTER — Emergency Department (HOSPITAL_BASED_OUTPATIENT_CLINIC_OR_DEPARTMENT_OTHER): Payer: Medicare Other

## 2013-04-22 ENCOUNTER — Encounter (HOSPITAL_BASED_OUTPATIENT_CLINIC_OR_DEPARTMENT_OTHER): Payer: Self-pay | Admitting: Emergency Medicine

## 2013-04-22 ENCOUNTER — Emergency Department (HOSPITAL_BASED_OUTPATIENT_CLINIC_OR_DEPARTMENT_OTHER)
Admission: EM | Admit: 2013-04-22 | Discharge: 2013-04-22 | Disposition: A | Discharge status: Home / Self Care | Payer: Medicare Other | Attending: Emergency Medicine | Admitting: Emergency Medicine

## 2013-04-22 DIAGNOSIS — Z8719 Personal history of other diseases of the digestive system: Secondary | ICD-10-CM | POA: Insufficient documentation

## 2013-04-22 DIAGNOSIS — Z79899 Other long term (current) drug therapy: Secondary | ICD-10-CM | POA: Insufficient documentation

## 2013-04-22 DIAGNOSIS — J45909 Unspecified asthma, uncomplicated: Secondary | ICD-10-CM | POA: Insufficient documentation

## 2013-04-22 DIAGNOSIS — J40 Bronchitis, not specified as acute or chronic: Secondary | ICD-10-CM | POA: Insufficient documentation

## 2013-04-22 DIAGNOSIS — H9190 Unspecified hearing loss, unspecified ear: Secondary | ICD-10-CM | POA: Insufficient documentation

## 2013-04-22 DIAGNOSIS — R05 Cough: Secondary | ICD-10-CM

## 2013-04-22 DIAGNOSIS — R059 Cough, unspecified: Secondary | ICD-10-CM | POA: Insufficient documentation

## 2013-04-22 DIAGNOSIS — Z8739 Personal history of other diseases of the musculoskeletal system and connective tissue: Secondary | ICD-10-CM | POA: Insufficient documentation

## 2013-04-22 DIAGNOSIS — IMO0002 Reserved for concepts with insufficient information to code with codable children: Secondary | ICD-10-CM | POA: Insufficient documentation

## 2013-04-22 DIAGNOSIS — Z8673 Personal history of transient ischemic attack (TIA), and cerebral infarction without residual deficits: Secondary | ICD-10-CM | POA: Insufficient documentation

## 2013-04-22 MED ORDER — ALBUTEROL SULFATE (5 MG/ML) 0.5% IN NEBU
5.0000 mg | INHALATION_SOLUTION | Freq: Once | RESPIRATORY_TRACT | Status: AC
  Administered 2013-04-22: 5 mg via RESPIRATORY_TRACT
  Filled 2013-04-22: qty 1

## 2013-04-22 MED ORDER — IPRATROPIUM BROMIDE 0.02 % IN SOLN
0.5000 mg | Freq: Once | RESPIRATORY_TRACT | Status: AC
  Administered 2013-04-22: 0.5 mg via RESPIRATORY_TRACT

## 2013-04-22 MED ORDER — IPRATROPIUM BROMIDE 0.02 % IN SOLN
RESPIRATORY_TRACT | Status: AC
  Administered 2013-04-22: 0.5 mg via RESPIRATORY_TRACT
  Filled 2013-04-22: qty 2.5

## 2013-04-22 MED ORDER — IPRATROPIUM BROMIDE HFA 17 MCG/ACT IN AERS: 2.0000 | INHALATION_SPRAY | Freq: Once | RESPIRATORY_TRACT | Status: DC

## 2013-04-22 NOTE — ED Provider Notes (Signed)
History     CSN: [id number]  Arrival date & time 04/22/13  1436   First MD Initiated Contact with Patient 04/22/13 1507      Chief Complaint  Patient presents with  . Bronchitis  . Cough    (Consider location/radiation/quality/duration/timing/severity/associated sxs/prior treatment) HPI Comments: Pt is hearing impaired  Patient is a 52 y.o. male presenting with cough. The history is provided by the patient. A language interpreter was used.  Cough Cough characteristics:  Productive Sputum characteristics:  Nondescript Severity:  Mild Onset quality:  Gradual Timing:  Intermittent Progression:  Unchanged Chronicity:  Recurrent Smoker: no   Context: upper respiratory infection and weather changes   Relieved by:  Nothing Ineffective treatments: cough syrup and antibiotics. Associated symptoms: no fever     Past Medical History  Diagnosis Date  . Deaf     Call [phone] for patient's sign language interpreter.   . Barrett's esophagus     states "minor"  . Asthma     prn inhaler  . TIA (transient ischemic attack) 12/20/2012    no current deficits  . Seasonal allergies   . High cholesterol   . Heartburn     occasional, related to certain foods  . Arthritis     right knee  . 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 Bunionectomy Left 2012  . Knee arthroscopy Left 06/02/2012  . Knee arthroscopy Right fall 2013  . Inguinal hernia repair    . Cardiac catheterization  06/24/2011  . Knee arthroscopy Left 03/03/2013    Procedure: ARTHROSCOPY KNEE WITH CHONDROPLASTY PATELLA  AND LATERAL TIBIAL PLATEAU;  Surgeon: Nestor Lewandowsky, MD;  Location: [facility];  Service: Orthopedics;  Laterality: Left;    Family History  Problem Relation Age of Onset  . Ovarian cancer Mother   . Coronary artery disease Father     History  Substance Use Topics  . Smoking status: Never Smoker   . Smokeless tobacco: Never Used   . Alcohol Use: No      Review of Systems  Constitutional: Negative for fever.  Respiratory: Positive for cough.   Cardiovascular: Negative.     Allergies  Simvastatin and Fish allergy  Home Medications   Current Outpatient Rx  Name  Route  Sig  Dispense  Refill  . albuterol (PROVENTIL HFA;VENTOLIN HFA) 108 (90 BASE) MCG/ACT inhaler   Inhalation   Inhale 2 puffs into the lungs every 6 (six) hours as needed. For wheezing         . clopidogrel (PLAVIX) 75 MG tablet   Oral   Take 1 tablet (75 mg total) by mouth daily with breakfast.   30 tablet   3   . esomeprazole (NEXIUM) 40 MG capsule   Oral   Take 40 mg by mouth daily before breakfast.         . fexofenadine (ALLEGRA) 180 MG tablet   Oral   Take 180 mg by mouth daily.         . fluticasone (FLONASE) 50 MCG/ACT nasal spray   Nasal   Place 2 sprays into the nose daily.         . Fluticasone-Salmeterol (ADVAIR) 250-50 MCG/DOSE AEPB   Inhalation   Inhale 1 puff into the lungs every 12 (twelve) hours.         Marland Kitchen HYDROcodone-acetaminophen (NORCO) 10-325 MG per tablet   Oral   Take 1-2 tablets by mouth every  4 (four) hours as needed for pain.   60 tablet   0     BP 120/76  Pulse 92  Temp(Src) 97.9 F (36.6 C) (Oral)  Resp 18  Ht 5\' 11"  (1.803 m)  Wt 171 lb (77.565 kg)  BMI 23.86 kg/m2  SpO2 100%  Physical Exam  Nursing note and vitals reviewed. Constitutional: He is oriented to person, place, and time. He appears well-developed and well-nourished.  HENT:  Head: Normocephalic and atraumatic.  Right Ear: External ear normal.  Left Ear: External ear normal.  Nose: Rhinorrhea present.  Mouth/Throat: Posterior oropharyngeal erythema present.  Eyes: Conjunctivae and EOM are normal.  Cardiovascular: Normal rate and regular rhythm.   Pulmonary/Chest: Effort normal and breath sounds normal.  Upper airway congestion  Musculoskeletal: Normal range of motion.  Neurological: He is alert and oriented  to person, place, and time.  Skin: Skin is warm and dry.  Psychiatric: He has a normal mood and affect.    ED Course  Procedures (including critical care time)  Labs Reviewed - No data to display Dg Chest 2 View  04/22/2013  *RADIOLOGY REPORT*  Clinical Data: Cough  CHEST - 2 VIEW  Comparison: 12/21/2012  Findings: The the cardiomediastinal silhouette is within normal limits.  Lungs are clear.  No pleural effusion.  No pneumothorax.  IMPRESSION: No active cardiopulmonary disease.   Original Report Authenticated By: Jolaine Click, M.D.      1. Cough       MDM  Likely related to allergies:don't think pt needs antibiotics at this time        Teressa Lower, NP 04/22/13 1629

## 2013-04-22 NOTE — ED Notes (Signed)
Pt states he has not been feeling well for six weeks. Pt states he has visited two doctors. Pt was prescribed amoxicillin and states he finished the antibiotic. Pt also states he was prescribed robitussin. Pt states he is out of the robitussin as well. Pt states he has tightness. Pt uses his Dulara inhaler. Pt states he has a bad cough and is coughing mucous up.

## 2013-04-22 NOTE — ED Provider Notes (Signed)
Medical screening examination/treatment/procedure(s) were performed by non-physician practitioner and as supervising physician I was immediately available for consultation/collaboration.  Soni Kegel, MD 04/22/13 1911 

## 2013-04-25 ENCOUNTER — Encounter (HOSPITAL_BASED_OUTPATIENT_CLINIC_OR_DEPARTMENT_OTHER): Payer: Self-pay | Admitting: Emergency Medicine

## 2013-04-25 ENCOUNTER — Emergency Department (HOSPITAL_BASED_OUTPATIENT_CLINIC_OR_DEPARTMENT_OTHER)
Admission: EM | Admit: 2013-04-25 | Discharge: 2013-04-25 | Disposition: A | Discharge status: Home / Self Care | Payer: Medicare Other | Attending: Emergency Medicine | Admitting: Emergency Medicine

## 2013-04-25 DIAGNOSIS — Z8739 Personal history of other diseases of the musculoskeletal system and connective tissue: Secondary | ICD-10-CM | POA: Insufficient documentation

## 2013-04-25 DIAGNOSIS — H9190 Unspecified hearing loss, unspecified ear: Secondary | ICD-10-CM | POA: Insufficient documentation

## 2013-04-25 DIAGNOSIS — Z8719 Personal history of other diseases of the digestive system: Secondary | ICD-10-CM | POA: Insufficient documentation

## 2013-04-25 DIAGNOSIS — Z862 Personal history of diseases of the blood and blood-forming organs and certain disorders involving the immune mechanism: Secondary | ICD-10-CM | POA: Insufficient documentation

## 2013-04-25 DIAGNOSIS — Z8709 Personal history of other diseases of the respiratory system: Secondary | ICD-10-CM | POA: Insufficient documentation

## 2013-04-25 DIAGNOSIS — J9801 Acute bronchospasm: Secondary | ICD-10-CM | POA: Insufficient documentation

## 2013-04-25 DIAGNOSIS — IMO0002 Reserved for concepts with insufficient information to code with codable children: Secondary | ICD-10-CM | POA: Insufficient documentation

## 2013-04-25 DIAGNOSIS — Z79899 Other long term (current) drug therapy: Secondary | ICD-10-CM | POA: Insufficient documentation

## 2013-04-25 DIAGNOSIS — Z8673 Personal history of transient ischemic attack (TIA), and cerebral infarction without residual deficits: Secondary | ICD-10-CM | POA: Insufficient documentation

## 2013-04-25 DIAGNOSIS — Z8639 Personal history of other endocrine, nutritional and metabolic disease: Secondary | ICD-10-CM | POA: Insufficient documentation

## 2013-04-25 DIAGNOSIS — J45901 Unspecified asthma with (acute) exacerbation: Secondary | ICD-10-CM | POA: Insufficient documentation

## 2013-04-25 MED ORDER — ALBUTEROL SULFATE (5 MG/ML) 0.5% IN NEBU: 2.5000 mg | INHALATION_SOLUTION | Freq: Four times a day (QID) | RESPIRATORY_TRACT | Status: DC | PRN

## 2013-04-25 MED ORDER — ALBUTEROL SULFATE (5 MG/ML) 0.5% IN NEBU
2.5000 mg | INHALATION_SOLUTION | Freq: Once | RESPIRATORY_TRACT | Status: AC
  Administered 2013-04-25: 2.5 mg via RESPIRATORY_TRACT
  Filled 2013-04-25: qty 0.5

## 2013-04-25 MED ORDER — ALBUTEROL SULFATE HFA 108 (90 BASE) MCG/ACT IN AERS
2.0000 | INHALATION_SPRAY | RESPIRATORY_TRACT | Status: DC | PRN
  Administered 2013-04-25: 2 via RESPIRATORY_TRACT
  Filled 2013-04-25: qty 6.7

## 2013-04-25 MED ORDER — IPRATROPIUM BROMIDE 0.02 % IN SOLN
0.5000 mg | Freq: Once | RESPIRATORY_TRACT | Status: AC
  Administered 2013-04-25: 0.5 mg via RESPIRATORY_TRACT
  Filled 2013-04-25: qty 2.5

## 2013-04-25 MED ORDER — FLUTICASONE-SALMETEROL 250-50 MCG/DOSE IN AEPB: 1.0000 | INHALATION_SPRAY | Freq: Two times a day (BID) | RESPIRATORY_TRACT | Status: DC

## 2013-04-25 NOTE — ED Notes (Signed)
Pt had acute onset sob this morning.  Pt has history of asthma.  Pt used inhaler but did not help.  No acute respiratory distress noted.  Lungs clear.

## 2013-04-25 NOTE — ED Notes (Signed)
Report received from [facility]. Assumed care of pt at this time.

## 2013-04-25 NOTE — ED Provider Notes (Signed)
History     CSN: [id number]  Arrival date & time 04/25/13  1216   First MD Initiated Contact with Patient 04/25/13 1343      Chief Complaint  Patient presents with  . Shortness of Breath    (Consider location/radiation/quality/duration/timing/severity/associated sxs/prior treatment) HPI Nathaniel [name] is a 52 y.o. male who presents to the ED with shortness of breath due to asthma. He ran out of his neb treatments at home this morning and came in for a treatment. He states he feels fine after the treatment. The history was provided by the patient. The patient is deaf but reads lips and can communicate verbally and in writing.   Past Medical History  Diagnosis Date  . Deaf     Call [phone] for patient's sign language interpreter.   . Barrett's esophagus     states "minor"  . Asthma     prn inhaler  . TIA (transient ischemic attack) 12/20/2012    no current deficits  . Seasonal allergies   . High cholesterol   . Heartburn     occasional, related to certain foods  . Arthritis     right knee  . 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 Bunionectomy Left 2012  . Knee arthroscopy Left 06/02/2012  . Knee arthroscopy Right fall 2013  . Inguinal hernia repair    . Cardiac catheterization  06/24/2011  . Knee arthroscopy Left 03/03/2013    Procedure: ARTHROSCOPY KNEE WITH CHONDROPLASTY PATELLA  AND LATERAL TIBIAL PLATEAU;  Surgeon: Nestor Lewandowsky, MD;  Location: [facility];  Service: Orthopedics;  Laterality: Left;    Family History  Problem Relation Age of Onset  . Ovarian cancer Mother   . Coronary artery disease Father     History  Substance Use Topics  . Smoking status: Never Smoker   . Smokeless tobacco: Never Used  . Alcohol Use: No      Review of Systems  Constitutional: Negative for fever and chills.  HENT: Negative for congestion, sore throat and trouble swallowing.   Respiratory: Positive  for shortness of breath and wheezing.   Gastrointestinal: Negative for nausea and vomiting.  Skin: Negative for rash.  Neurological: Negative for dizziness and headaches.    Allergies  Simvastatin and Fish allergy  Home Medications   Current Outpatient Rx  Name  Route  Sig  Dispense  Refill  . esomeprazole (NEXIUM) 40 MG capsule   Oral   Take 40 mg by mouth daily before breakfast.         . fexofenadine (ALLEGRA) 180 MG tablet   Oral   Take 180 mg by mouth daily.         . Fluticasone-Salmeterol (ADVAIR) 250-50 MCG/DOSE AEPB   Inhalation   Inhale 1 puff into the lungs every 12 (twelve) hours.         . mometasone-formoterol (DULERA) 100-5 MCG/ACT AERO   Inhalation   Inhale 1 puff into the lungs daily.           BP 113/81  Pulse 90  Temp(Src) 98 F (36.7 C) (Oral)  Resp 24  SpO2 100%  Physical Exam  Nursing note and vitals reviewed. Constitutional: He is oriented to person, place, and time. He appears well-developed and well-nourished. No distress.  HENT:  Head: Normocephalic and atraumatic.  Eyes: Conjunctivae and EOM are normal. Pupils are equal, round, and reactive to light.  Neck: Normal range of  motion. Neck supple.  Cardiovascular: Normal rate, regular rhythm and normal heart sounds.   Pulmonary/Chest: Effort normal and breath sounds normal. He has no wheezes.  Examined after albuterol/atrovent neb.  Musculoskeletal: Normal range of motion.  Neurological: He is alert and oriented to person, place, and time. No cranial nerve deficit.  Skin: Skin is warm and dry.  Psychiatric: His behavior is normal. Judgment and thought content normal.    ED Course  Procedures (including critical care time) Patient was given albuterol/atrovent treatment on arrival to the ED and has had no wheezing or shortness of breath since treatment.    MDM  Assessment: 52 y.o. male with asthma  Plan:  Refill patient's medication so he will have it at home if symptoms  return   Patient to follow up with his PCP or return here if symptoms worsen. I have reviewed this patient's vital signs, nurses notes and discussed clinical findings and plan of care with the patient. Patient voices understanding.   [facility] Orlene Och, [place] 04/26/13 1610

## 2013-04-26 NOTE — ED Provider Notes (Signed)
Medical screening examination/treatment/procedure(s) were performed by non-physician practitioner and as supervising physician I was immediately available for consultation/collaboration.   Youa Deloney B. Bernette Mayers, MD 04/26/13 [phone]

## 2013-06-22 ENCOUNTER — Encounter: Payer: Self-pay | Admitting: Podiatry

## 2013-06-22 ENCOUNTER — Ambulatory Visit (INDEPENDENT_AMBULATORY_CARE_PROVIDER_SITE_OTHER): Payer: Medicare Other | Admitting: Podiatry

## 2013-06-22 VITALS — BP 106/67 | HR 63

## 2013-06-22 DIAGNOSIS — M25571 Pain in right ankle and joints of right foot: Secondary | ICD-10-CM

## 2013-06-22 DIAGNOSIS — M25579 Pain in unspecified ankle and joints of unspecified foot: Secondary | ICD-10-CM

## 2013-06-22 DIAGNOSIS — M7750 Other enthesopathy of unspecified foot: Secondary | ICD-10-CM

## 2013-06-22 NOTE — Progress Notes (Signed)
Subjective:  This is 52 year old hearing impaired male, communicates with some writing, presents complaining of pain at the first big toe joint right.  Duration of several months. He has had previous bunion surgery on both feet. He also has pain on left knee.   Objective: Positive of palpable raised bump over the first Metatarsal head area right, pain elicited with pressure or dorsiflexion of the joint. Vascular: WNL. Derm: WNL. X-ray: Status post bilateral bunion surgery bilateral. Short first Metatarsal bilateral. AP view: Presence of Internal fixation screw on right first Metatarsal head. Lateral view reveal dorsally displaced internal fixation screw right foot.  Assessment: Pain right first MPJ area. Dorsally displaced internal fixation screw right with pain in closed in shoe gear or with joint motion.  Plan:  Reviewed clinical findings and available treatment options. Explained that the cause of pain is due to displaced internal fixation screw on top of the right first metatarsal head that is pressed with shoe and joint motion. Advised to have the screw removed.  Surgery consent form reviewed.

## 2013-07-15 DIAGNOSIS — T8489XA Other specified complication of internal orthopedic prosthetic devices, implants and grafts, initial encounter: Secondary | ICD-10-CM

## 2013-07-15 HISTORY — PX: OTHER SURGICAL HISTORY: SHX169

## 2013-07-19 ENCOUNTER — Encounter: Payer: Self-pay | Admitting: Podiatry

## 2013-07-23 ENCOUNTER — Ambulatory Visit (INDEPENDENT_AMBULATORY_CARE_PROVIDER_SITE_OTHER): Payer: Medicare Other | Admitting: Podiatry

## 2013-07-23 DIAGNOSIS — M79609 Pain in unspecified limb: Secondary | ICD-10-CM

## 2013-07-23 DIAGNOSIS — M79671 Pain in right foot: Secondary | ICD-10-CM

## 2013-07-23 NOTE — Progress Notes (Signed)
Status post one week following screw removal from right first Metatarsal head. Patient came in with interpretor. Right foot has been removed of original bandage and covered with new bandage that was applied by patient. Stated that the bandage smell and changed it himself. Surgical wound is clean with sutures over the incision that is about 1.5cm long. No abnormal findings noted. Wound cleansed with Betadine solution.  Return in one week to remove sutures.

## 2013-07-28 ENCOUNTER — Ambulatory Visit (INDEPENDENT_AMBULATORY_CARE_PROVIDER_SITE_OTHER): Payer: Medicare Other | Admitting: Podiatry

## 2013-07-28 DIAGNOSIS — M79671 Pain in right foot: Secondary | ICD-10-CM

## 2013-07-28 DIAGNOSIS — M79609 Pain in unspecified limb: Secondary | ICD-10-CM

## 2013-07-28 NOTE — Progress Notes (Signed)
Subjective:  Patient came in with an interpreter to have sutures removed. He had dressing removed on his own and had band aid on it when he presented himself to the office.  Objective:  Wound is clean and dry. No edema or erythema noted. Well coapted.  Assessment: Satisfactory post op wound healing from removal of internal fixation screw.  Plan: Small incision, about 1.5cm suture removed.  Selofix tape placed. Patient is to use band aid when the tape comes off. Return as needed.

## 2013-08-18 ENCOUNTER — Encounter: Payer: Medicare Other | Admitting: Podiatry

## 2013-08-20 ENCOUNTER — Ambulatory Visit (INDEPENDENT_AMBULATORY_CARE_PROVIDER_SITE_OTHER): Payer: Medicare Other | Admitting: Podiatry

## 2013-08-20 DIAGNOSIS — M7750 Other enthesopathy of unspecified foot: Secondary | ICD-10-CM

## 2013-08-20 DIAGNOSIS — M79671 Pain in right foot: Secondary | ICD-10-CM

## 2013-08-20 DIAGNOSIS — M7741 Metatarsalgia, right foot: Secondary | ICD-10-CM

## 2013-08-20 DIAGNOSIS — M79609 Pain in unspecified limb: Secondary | ICD-10-CM

## 2013-08-20 MED ORDER — HYDROCODONE-ACETAMINOPHEN 10-325 MG PO TABS: 1.0000 | ORAL_TABLET | Freq: Four times a day (QID) | ORAL | Status: DC | PRN

## 2013-08-20 NOTE — Progress Notes (Signed)
Patient came in with an interpreter complaining of pain on ball of right foot.  Has had previous [place] bunionectomy and recently removed loose pin. Now the pain is at the 2nd and 3rd MPJ area.  Patient wants to know what other surgical options available.   Objective findings: Elevated first ray with dorsally enlarged bump at the first MPJ right. Hypermobile first ray and short first ray noted.  Assessment: Lesser metatarsalgia 2nd and 3rd right. Hypermobile/elevated first ray right. Patient wants to know what other surgical options available.   Tx. Injection to 2nd MPJ right with 5mg  of Triamcinolone and 2ml of 1% Xylocaine.  May need Cotton osteotomy with graft and McBride on right if pain continues.

## 2013-08-24 ENCOUNTER — Emergency Department (HOSPITAL_BASED_OUTPATIENT_CLINIC_OR_DEPARTMENT_OTHER)
Admission: EM | Admit: 2013-08-24 | Discharge: 2013-08-24 | Disposition: A | Discharge status: Home / Self Care | Payer: Medicare Other | Attending: Emergency Medicine | Admitting: Emergency Medicine

## 2013-08-24 ENCOUNTER — Encounter (HOSPITAL_BASED_OUTPATIENT_CLINIC_OR_DEPARTMENT_OTHER): Payer: Self-pay | Admitting: *Deleted

## 2013-08-24 ENCOUNTER — Emergency Department (HOSPITAL_BASED_OUTPATIENT_CLINIC_OR_DEPARTMENT_OTHER): Payer: Medicare Other

## 2013-08-24 DIAGNOSIS — J45901 Unspecified asthma with (acute) exacerbation: Secondary | ICD-10-CM | POA: Insufficient documentation

## 2013-08-24 DIAGNOSIS — M1710 Unilateral primary osteoarthritis, unspecified knee: Secondary | ICD-10-CM | POA: Insufficient documentation

## 2013-08-24 DIAGNOSIS — Z8669 Personal history of other diseases of the nervous system and sense organs: Secondary | ICD-10-CM | POA: Insufficient documentation

## 2013-08-24 DIAGNOSIS — IMO0002 Reserved for concepts with insufficient information to code with codable children: Secondary | ICD-10-CM | POA: Insufficient documentation

## 2013-08-24 DIAGNOSIS — Z8739 Personal history of other diseases of the musculoskeletal system and connective tissue: Secondary | ICD-10-CM | POA: Insufficient documentation

## 2013-08-24 DIAGNOSIS — Z8639 Personal history of other endocrine, nutritional and metabolic disease: Secondary | ICD-10-CM | POA: Insufficient documentation

## 2013-08-24 DIAGNOSIS — Z862 Personal history of diseases of the blood and blood-forming organs and certain disorders involving the immune mechanism: Secondary | ICD-10-CM | POA: Insufficient documentation

## 2013-08-24 DIAGNOSIS — Z79899 Other long term (current) drug therapy: Secondary | ICD-10-CM | POA: Insufficient documentation

## 2013-08-24 DIAGNOSIS — Z8673 Personal history of transient ischemic attack (TIA), and cerebral infarction without residual deficits: Secondary | ICD-10-CM | POA: Insufficient documentation

## 2013-08-24 DIAGNOSIS — R0789 Other chest pain: Secondary | ICD-10-CM | POA: Insufficient documentation

## 2013-08-24 DIAGNOSIS — Z8719 Personal history of other diseases of the digestive system: Secondary | ICD-10-CM | POA: Insufficient documentation

## 2013-08-24 MED ORDER — ALBUTEROL SULFATE HFA 108 (90 BASE) MCG/ACT IN AERS
2.0000 | INHALATION_SPRAY | Freq: Once | RESPIRATORY_TRACT | Status: AC
  Administered 2013-08-24: 2 via RESPIRATORY_TRACT
  Filled 2013-08-24: qty 6.7

## 2013-08-24 MED ORDER — ALBUTEROL SULFATE (2.5 MG/3ML) 0.083% IN NEBU: 2.5000 mg | INHALATION_SOLUTION | Freq: Four times a day (QID) | RESPIRATORY_TRACT | Status: DC | PRN

## 2013-08-24 MED ORDER — ALBUTEROL SULFATE (5 MG/ML) 0.5% IN NEBU
5.0000 mg | INHALATION_SOLUTION | Freq: Once | RESPIRATORY_TRACT | Status: AC
  Administered 2013-08-24: 5 mg via RESPIRATORY_TRACT
  Filled 2013-08-24: qty 1

## 2013-08-24 MED ORDER — PREDNISONE 20 MG PO TABS: 40.0000 mg | ORAL_TABLET | Freq: Every day | ORAL | Status: DC

## 2013-08-24 NOTE — ED Notes (Signed)
Spoke with Tour manager for [facility]. Placed a call to CSW at [facility] as well. Waiting on a return call for when an interpreter can be available.

## 2013-08-24 NOTE — ED Notes (Addendum)
Having chest tightness with breathing. Patient states that he feels very tired today, worse than yesterday. Denies cough and fever at home. Patient used to use a nebulizer but his insurance wil no longer cover it according to the patient.

## 2013-08-24 NOTE — ED Notes (Signed)
MD at bedside. 

## 2013-08-24 NOTE — ED Provider Notes (Signed)
CSN: [id number]     Arrival date & time 08/24/13  1142 History   First MD Initiated Contact with Patient 08/24/13 1217     Chief Complaint  Patient presents with  . Dizziness   (Consider location/radiation/quality/duration/timing/severity/associated sxs/prior Treatment) HPI Comments: Pt is a deaf 52 y/o male with hx of asthma - states he has been out of his medications albuterol neb and this AM felt SOB - felt tight in the chest and had SOB - states it feels similar to his asthma.  Sx are mild, no fever, no swelling.  Nothing makes this better or worse.  The history is provided by the patient and medical records.    Past Medical History  Diagnosis Date  . Deaf     Call [phone] for patient's sign language interpreter.   . Barrett's esophagus     states "minor"  . Asthma     prn inhaler  . TIA (transient ischemic attack) 12/20/2012    no current deficits  . Seasonal allergies   . High cholesterol   . Heartburn     occasional, related to certain foods  . Arthritis     right knee  . 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 Bunionectomy Left 2012  . Knee arthroscopy Left 06/02/2012  . Knee arthroscopy Right fall 2013  . Inguinal hernia repair    . Cardiac catheterization  06/24/2011  . Knee arthroscopy Left 03/03/2013    Procedure: ARTHROSCOPY KNEE WITH CHONDROPLASTY PATELLA  AND LATERAL TIBIAL PLATEAU;  Surgeon: Nestor Lewandowsky, MD;  Location: [facility];  Service: Orthopedics;  Laterality: Left;  . Foot implant removal Right 07/15/2013    @ [facility]   Family History  Problem Relation Age of Onset  . Ovarian cancer Mother   . Coronary artery disease Father    History  Substance Use Topics  . Smoking status: Never Smoker   . Smokeless tobacco: Never Used  . Alcohol Use: No    Review of Systems  All other systems reviewed and are negative.    Allergies  Simvastatin and Fish allergy  Home Medications    Current Outpatient Rx  Name  Route  Sig  Dispense  Refill  . albuterol (PROVENTIL) (2.5 MG/3ML) 0.083% nebulizer solution   Nebulization   Take 3 mLs (2.5 mg total) by nebulization every 6 (six) hours as needed for wheezing.   75 mL   12   . albuterol (PROVENTIL) (5 MG/ML) 0.5% nebulizer solution   Nebulization   Take 0.5 mLs (2.5 mg total) by nebulization every 6 (six) hours as needed for wheezing.   20 mL   12   . esomeprazole (NEXIUM) 40 MG capsule   Oral   Take 40 mg by mouth daily before breakfast.         . fexofenadine (ALLEGRA) 180 MG tablet   Oral   Take 180 mg by mouth daily.         . Fluticasone-Salmeterol (ADVAIR DISKUS) 250-50 MCG/DOSE AEPB   Inhalation   Inhale 1 puff into the lungs every 12 (twelve) hours.   60 each   0   . Fluticasone-Salmeterol (ADVAIR) 250-50 MCG/DOSE AEPB   Inhalation   Inhale 1 puff into the lungs every 12 (twelve) hours.         Marland Kitchen HYDROcodone-acetaminophen (NORCO) 10-325 MG per tablet   Oral   Take 1 tablet by mouth every 6 (six) hours as  needed.   60 tablet   1   . mometasone-formoterol (DULERA) 100-5 MCG/ACT AERO   Inhalation   Inhale 1 puff into the lungs daily.         . predniSONE (DELTASONE) 20 MG tablet   Oral   Take 2 tablets (40 mg total) by mouth daily.   10 tablet   0    BP 112/82  Pulse 60  Temp(Src) 97.8 F (36.6 C) (Oral)  Resp 16  Ht 5\' 11"  (1.803 m)  Wt 167 lb (75.751 kg)  BMI 23.3 kg/m2  SpO2 96% Physical Exam  Nursing note and vitals reviewed. Constitutional: He appears well-developed and well-nourished. No distress.  HENT:  Head: Normocephalic and atraumatic.  Mouth/Throat: Oropharynx is clear and moist. No oropharyngeal exudate.  Eyes: Conjunctivae and EOM are normal. Pupils are equal, round, and reactive to light. Right eye exhibits no discharge. Left eye exhibits no discharge. No scleral icterus.  Neck: Normal range of motion. Neck supple. No JVD present. No thyromegaly present.   Cardiovascular: Normal rate, regular rhythm, normal heart sounds and intact distal pulses.  Exam reveals no gallop and no friction rub.   No murmur heard. Pulmonary/Chest: Effort normal. No respiratory distress. He has wheezes ( mild expiratory wheezing.). He has no rales.  Abdominal: Soft. Bowel sounds are normal. He exhibits no distension and no mass. There is no tenderness.  Musculoskeletal: Normal range of motion. He exhibits no edema and no tenderness.  Lymphadenopathy:    He has no cervical adenopathy.  Neurological: He is alert. Coordination normal.  Skin: Skin is warm and dry. No rash noted. No erythema.  Psychiatric: He has a normal mood and affect. His behavior is normal.    ED Course  Procedures (including critical care time) Labs Review Labs Reviewed - No data to display Imaging Review Dg Chest 2 View  08/24/2013   *RADIOLOGY REPORT*  Clinical Data: Chest tightness, dizziness  CHEST - 2 VIEW  Comparison: 04/22/2013  Findings: Lungs are essentially clear.  No focal consolidation.  No pleural effusion or pneumothorax.  Heart is normal in size.  Mild degenerative changes of the visualized thoracolumbar spine.  IMPRESSION: No evidence of acute cardiopulmonary disease.   Original Report Authenticated By: Charline Bills, M.D.    MDM   1. Asthma attack    Overall the pt appears well, he is deaf and has a hard time communicating - will attempt to get interpreter - until that time will get ECG and CXR to eval for other sources of sx, likely asthma.  Neb ordered.  ED ECG REPORT  I personally interpreted this EKG   Date: 08/24/2013   Rate: 63  Rhythm: normal sinus rhythm  QRS Axis: normal  Intervals: normal  ST/T Wave abnormalities: normal  Conduction Disutrbances:none  Narrative Interpretation:   Old EKG Reviewed: Compared with 12/20/2012, no significant changes   After reevaluation the patient has no symptoms, has improved significantly with the albuterol until  symptom-free at this time. He denies chest pain, shoulder pain, jaw pain and has a normal EKG. I have given him the instructions for return and precautions and he has agreed.  Meds given in ED:  Medications  albuterol (PROVENTIL) (5 MG/ML) 0.5% nebulizer solution 5 mg (5 mg Nebulization Given 08/24/13 1229)  albuterol (PROVENTIL HFA;VENTOLIN HFA) 108 (90 BASE) MCG/ACT inhaler 2 puff (2 puffs Inhalation Given 08/24/13 1230)    New Prescriptions   ALBUTEROL (PROVENTIL) (2.5 MG/3ML) 0.083% NEBULIZER SOLUTION    Take 3  mLs (2.5 mg total) by nebulization every 6 (six) hours as needed for wheezing.   PREDNISONE (DELTASONE) 20 MG TABLET    Take 2 tablets (40 mg total) by mouth daily.      Vida Roller, MD 08/24/13 [phone]

## 2013-09-24 ENCOUNTER — Encounter: Payer: Self-pay | Admitting: Podiatry

## 2013-09-24 ENCOUNTER — Ambulatory Visit: Payer: Medicare Other | Admitting: Podiatry

## 2013-09-24 VITALS — BP 132/82 | HR 85

## 2013-09-24 DIAGNOSIS — M79671 Pain in right foot: Secondary | ICD-10-CM

## 2013-09-24 DIAGNOSIS — M7741 Metatarsalgia, right foot: Secondary | ICD-10-CM

## 2013-09-24 MED ORDER — HYDROCODONE-ACETAMINOPHEN 10-325 MG PO TABS: 1.0000 | ORAL_TABLET | Freq: Four times a day (QID) | ORAL | Status: DC | PRN

## 2013-09-24 NOTE — Progress Notes (Signed)
Subjective:  Patient presents with an interpreter complaining of pain on right first MPJ.  Pain comes after been on feet a lot. He works a Statistician working on two farms.   Objective: Recurring pain in right foot with recurring bunion.  Unstable, elevated and hypermobile first ray right. No edema or erythema noted.  Assessment: Right first MPJ arthropathy.  Hallux limitus right .   Plan: Reviewed findings.  May benefit from Cotton type osteotomy to plantar flex the first ray. Patient will be referred to another provider.  Patient requested for pain prescription. Percocet 10/325 prescribed.  Patient rejected taking prescription sheet stating that the pill would give him head aches.  He used to take 10/650 without problems, which is discontinued by manufacturer.

## 2013-09-24 NOTE — Patient Instructions (Addendum)
Seen for pain on right foot. Reviewed available options, mainly staying off of feet.  Will refer to another foot doctor for 2nd opinion or for further treatment.

## 2013-11-02 ENCOUNTER — Telehealth: Payer: Self-pay | Admitting: Neurology

## 2013-11-02 ENCOUNTER — Encounter: Payer: Self-pay | Admitting: Neurology

## 2013-11-02 ENCOUNTER — Ambulatory Visit (INDEPENDENT_AMBULATORY_CARE_PROVIDER_SITE_OTHER): Payer: Medicare Other | Admitting: Neurology

## 2013-11-02 VITALS — BP 116/76 | HR 70 | Ht 70.0 in | Wt 167.0 lb

## 2013-11-02 DIAGNOSIS — H539 Unspecified visual disturbance: Secondary | ICD-10-CM

## 2013-11-02 NOTE — Telephone Encounter (Signed)
Patient sched.

## 2013-11-02 NOTE — Progress Notes (Signed)
Reason for visit: Eye pain, visual blurring  Nathaniel [name] is a 52 y.o. male  History of present illness:  Nathaniel [name] is a 52 year old right-handed white male with a history of deafness. The patient gives a history of some alteration in vision involving the right eye that began approximately one year ago, but the issues have worsened over the last several days. The patient reports a dull achy pain in the right eye, and some blurring of vision. The patient was seen by Dr. Allena Katz today, and he was felt to have a possible optic neuritis. The patient denies any headache, or neck stiffness. The patient reports no numbness or weakness on the face, arms, or legs. The patient has not had any alteration in balance or problems controlling the bowels or the bladder. In December 2013, the patient was admitted with a stroke workup with left-sided visual disturbance, and left-sided weakness and dysarthria. Workup did not show evidence of an acute stroke. The patient has done well since that time. The patient is referred today for an evaluation of the left eye visual disturbance. The patient is not felt to have any issues such as glaucoma.  Past Medical History  Diagnosis Date  . Deaf     Call [phone] for patient's sign language interpreter.   . Barrett's esophagus     states "minor"  . Asthma     prn inhaler  . TIA (transient ischemic attack) 12/20/2012    no current deficits  . Seasonal allergies   . High cholesterol   . Heartburn     occasional, related to certain foods  . Arthritis     right knee  . 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 Bunionectomy Left 2012  . Knee arthroscopy Left 06/02/2012  . Knee arthroscopy Right fall 2013  . Inguinal hernia repair    . Cardiac catheterization  06/24/2011  . Knee arthroscopy Left 03/03/2013    Procedure: ARTHROSCOPY KNEE WITH CHONDROPLASTY PATELLA  AND LATERAL TIBIAL PLATEAU;  Surgeon: Nestor Lewandowsky, MD;  Location: [facility];  Service: Orthopedics;  Laterality: Left;  . Foot implant removal Right 07/15/2013    @ [facility]    Family History  Problem Relation Age of Onset  . Ovarian cancer Mother   . Coronary artery disease Father     Social history:  reports that he has never smoked. He has never used smokeless tobacco. He reports that he does not drink alcohol or use illicit drugs.  Medications:  Current Outpatient Prescriptions on File Prior to Visit  Medication Sig Dispense Refill  . albuterol (PROVENTIL) (2.5 MG/3ML) 0.083% nebulizer solution Take 3 mLs (2.5 mg total) by nebulization every 6 (six) hours as needed for wheezing.  75 mL  12  . esomeprazole (NEXIUM) 40 MG capsule Take 40 mg by mouth as needed.       . fexofenadine (ALLEGRA) 180 MG tablet Take 180 mg by mouth daily.      . Fluticasone-Salmeterol (ADVAIR) 250-50 MCG/DOSE AEPB Inhale 1 puff into the lungs every 12 (twelve) hours.      . mometasone-formoterol (DULERA) 100-5 MCG/ACT AERO Inhale 1 puff into the lungs daily.      Marland Kitchen NASONEX 50 MCG/ACT nasal spray Place 2 sprays into the nose daily.        No current facility-administered medications on file prior to visit.      Allergies  Allergen Reactions  .  Simvastatin Other (See Comments)    DEPRESSION, VISUAL DISTURBANCE(FLASHING LIGHTS)  . Fish Allergy Rash    SOFTSHELL FISH    ROS:  Out of a complete 14 system review of symptoms, the patient complains only of the following symptoms, and all other reviewed systems are negative.  Blurred vision Eye pain Deafness  Blood pressure 116/76, pulse 70, height 5\' 10"  (1.778 m), weight 167 lb (75.751 kg).  Physical Exam  General: The patient is alert and cooperative at the time of the examination. The patient is deaf.  Head: Pupils are equal, round, and reactive to light. Discs are flat bilaterally.  Neck: The neck is supple, no carotid bruits are noted.  Respiratory: The respiratory  examination is clear.  Cardiovascular: The cardiovascular examination reveals a regular rate and rhythm, no obvious murmurs or rubs are noted.  Skin: Extremities are without significant edema.  Neurologic Exam  Mental status: The patient is alert and oriented at this time.  Cranial nerves: Facial symmetry is present. There is good sensation of the face to pinprick and soft touch bilaterally. The strength of the facial muscles and the muscles to head turning and shoulder shrug are normal bilaterally. Speech is dysarthric, not aphasic. Extraocular movements are full. Visual fields are full. The patient is deaf.  Motor: The motor testing reveals 5 over 5 strength of all 4 extremities. Good symmetric motor tone is noted throughout.  Sensory: Sensory testing is intact to pinprick, soft touch, vibration sensation on all 4 extremities. No evidence of extinction is noted.  Coordination: Cerebellar testing reveals good finger-nose-finger and heel-to-shin bilaterally.  Gait and station: Gait is normal. Tandem gait is normal. Romberg is negative. No drift is seen.  Reflexes: Deep tendon reflexes are symmetric and normal bilaterally. Toes are downgoing bilaterally.   Assessment/Plan:  1. Visual disturbance, OD  The patient indicates a long duration problems with visual blurring and eye discomfort. This is the unusual for a true optic neuritis, as the discomfort would disappear after several days or weeks. At any rate, the patient will be set up for further evaluation at this time. MRI of the brain will be done, and a visual evoked response test will be done. The patient will have blood work today. The patient followup in 3 months. Steroids will not be initiated as the problem appears to be quite chronic. MRI of the brain done in December 2013 reveals stable ventriculomegaly, and mild small vessel disease.  Marlan Palau MD 11/02/2013 3:29 PM  [facility] [address]  [address] [place], [place] [address]  Phone [phone] Fax [phone]

## 2013-11-04 ENCOUNTER — Ambulatory Visit: Payer: Self-pay | Admitting: Neurology

## 2013-11-04 LAB — SEDIMENTATION RATE: Sed Rate: 7 mm/hr (ref 0–30)

## 2013-11-04 LAB — ANA W/REFLEX: Anti Nuclear Antibody(ANA): NEGATIVE

## 2013-11-04 NOTE — Progress Notes (Signed)
Quick Note:  Shared unremarkable blood work w/ patient thru an Engineer, technical sales, patient understood ______

## 2013-11-10 ENCOUNTER — Other Ambulatory Visit: Payer: Medicare Other

## 2013-11-11 ENCOUNTER — Ambulatory Visit (INDEPENDENT_AMBULATORY_CARE_PROVIDER_SITE_OTHER): Payer: Medicare Other

## 2013-11-11 ENCOUNTER — Telehealth: Payer: Self-pay | Admitting: Neurology

## 2013-11-11 DIAGNOSIS — H539 Unspecified visual disturbance: Secondary | ICD-10-CM

## 2013-11-11 NOTE — Procedures (Signed)
    History:   Nathaniel [name] is a 52 year old gentleman with a one-year history of visual blurring, right eye discomfort , worsening vision in the right eye. The patient is being evaluated for a possible optic neuritis.  Description: The visual evoked response test was performed today using 32 x 32 check sizes. The absolute latencies for the N1 and the P100 wave forms were within normal limits bilaterally. The amplitudes for the P100 wave forms were also within normal limits bilaterally. The visual acuity was not recorded.  Impression:  The visual evoked response test above was within normal limits bilaterally. No evidence of conduction slowing was seen within the anterior visual pathways on either side on today's evaluation.

## 2013-11-11 NOTE — Telephone Encounter (Signed)
I called patient. The visual evoked response test was normal. MRI of the brain is pending.

## 2013-11-17 ENCOUNTER — Ambulatory Visit
Admission: RE | Admit: 2013-11-17 | Discharge: 2013-11-17 | Disposition: A | Discharge status: Home / Self Care | Payer: PRIVATE HEALTH INSURANCE | Source: Ambulatory Visit | Attending: Neurology | Admitting: Neurology

## 2013-11-17 DIAGNOSIS — H5310 Unspecified subjective visual disturbances: Secondary | ICD-10-CM

## 2013-11-17 DIAGNOSIS — H539 Unspecified visual disturbance: Secondary | ICD-10-CM

## 2013-11-17 MED ORDER — GADOBENATE DIMEGLUMINE 529 MG/ML IV SOLN: 15.0000 mL | Freq: Once | INTRAVENOUS | Status: AC | PRN

## 2013-11-22 ENCOUNTER — Telehealth: Payer: Self-pay | Admitting: Neurology

## 2013-11-22 NOTE — Telephone Encounter (Signed)
I called patient and I left a message. MRI the brain was stable from December 2013.  Minimal white matter changes, ventriculomegaly is seen and is stable. Blood work done was unremarkable, a visual evoked response test is unremarkable. No evidence of MS, or stroke.

## 2013-12-30 HISTORY — PX: COLONOSCOPY: SHX174

## 2014-01-11 ENCOUNTER — Telehealth: Payer: Self-pay | Admitting: Neurology

## 2014-01-11 ENCOUNTER — Ambulatory Visit: Payer: Medicaid Other | Admitting: Neurology

## 2014-01-11 NOTE — Telephone Encounter (Signed)
This patient did not show for the revisit appointment today. 

## 2014-02-07 ENCOUNTER — Emergency Department (HOSPITAL_COMMUNITY): Payer: PRIVATE HEALTH INSURANCE

## 2014-02-07 ENCOUNTER — Emergency Department (HOSPITAL_COMMUNITY)
Admission: EM | Admit: 2014-02-07 | Discharge: 2014-02-07 | Disposition: A | Discharge status: Home / Self Care | Payer: PRIVATE HEALTH INSURANCE | Attending: Emergency Medicine | Admitting: Emergency Medicine

## 2014-02-07 ENCOUNTER — Encounter (HOSPITAL_COMMUNITY): Payer: Self-pay | Admitting: Emergency Medicine

## 2014-02-07 DIAGNOSIS — IMO0002 Reserved for concepts with insufficient information to code with codable children: Secondary | ICD-10-CM

## 2014-02-07 DIAGNOSIS — Z8639 Personal history of other endocrine, nutritional and metabolic disease: Secondary | ICD-10-CM | POA: Insufficient documentation

## 2014-02-07 DIAGNOSIS — Z79899 Other long term (current) drug therapy: Secondary | ICD-10-CM | POA: Insufficient documentation

## 2014-02-07 DIAGNOSIS — Z792 Long term (current) use of antibiotics: Secondary | ICD-10-CM | POA: Insufficient documentation

## 2014-02-07 DIAGNOSIS — F4321 Adjustment disorder with depressed mood: Secondary | ICD-10-CM

## 2014-02-07 DIAGNOSIS — Z862 Personal history of diseases of the blood and blood-forming organs and certain disorders involving the immune mechanism: Secondary | ICD-10-CM | POA: Insufficient documentation

## 2014-02-07 DIAGNOSIS — Y929 Unspecified place or not applicable: Secondary | ICD-10-CM | POA: Insufficient documentation

## 2014-02-07 DIAGNOSIS — Z8673 Personal history of transient ischemic attack (TIA), and cerebral infarction without residual deficits: Secondary | ICD-10-CM | POA: Insufficient documentation

## 2014-02-07 DIAGNOSIS — J45909 Unspecified asthma, uncomplicated: Secondary | ICD-10-CM | POA: Insufficient documentation

## 2014-02-07 DIAGNOSIS — Z9889 Other specified postprocedural states: Secondary | ICD-10-CM | POA: Insufficient documentation

## 2014-02-07 DIAGNOSIS — F4329 Adjustment disorder with other symptoms: Secondary | ICD-10-CM

## 2014-02-07 DIAGNOSIS — Y9389 Activity, other specified: Secondary | ICD-10-CM | POA: Insufficient documentation

## 2014-02-07 DIAGNOSIS — F4325 Adjustment disorder with mixed disturbance of emotions and conduct: Secondary | ICD-10-CM | POA: Insufficient documentation

## 2014-02-07 DIAGNOSIS — M1710 Unilateral primary osteoarthritis, unspecified knee: Secondary | ICD-10-CM | POA: Insufficient documentation

## 2014-02-07 DIAGNOSIS — Z8669 Personal history of other diseases of the nervous system and sense organs: Secondary | ICD-10-CM | POA: Insufficient documentation

## 2014-02-07 LAB — CBC
HEMATOCRIT: 44.2 % (ref 39.0–52.0)
Hemoglobin: 15.2 g/dL (ref 13.0–17.0)
MCH: 30.9 pg (ref 26.0–34.0)
MCHC: 34.4 g/dL (ref 30.0–36.0)
MCV: 89.8 fL (ref 78.0–100.0)
Platelets: 206 10*3/uL (ref 150–400)
RBC: 4.92 MIL/uL (ref 4.22–5.81)
RDW: 12.5 % (ref 11.5–15.5)
WBC: 9.3 10*3/uL (ref 4.0–10.5)

## 2014-02-07 LAB — BASIC METABOLIC PANEL
BUN: 13 mg/dL (ref 6–23)
CHLORIDE: 103 meq/L (ref 96–112)
CO2: 22 mEq/L (ref 19–32)
Calcium: 9 mg/dL (ref 8.4–10.5)
Creatinine, Ser: 0.87 mg/dL (ref 0.50–1.35)
GFR calc non Af Amer: >90 mL/min (ref >90)
GLUCOSE: 117 mg/dL — AB (ref 70–99)
Potassium: 3.8 mEq/L (ref 3.7–5.3)
SODIUM: 140 meq/L (ref 137–147)

## 2014-02-07 LAB — RAPID URINE DRUG SCREEN, HOSP PERFORMED
Amphetamines: NOT DETECTED
BARBITURATES: NOT DETECTED
BENZODIAZEPINES: NOT DETECTED
Cocaine: NOT DETECTED
OPIATES: NOT DETECTED
Tetrahydrocannabinol: NOT DETECTED

## 2014-02-07 LAB — ETHANOL: Alcohol, Ethyl (B): <11 mg/dL (ref 0–11)

## 2014-02-07 NOTE — ED Notes (Signed)
Pt received from pod B- alert/oriented x 3, ambulatory from stretcher to bed. No c/o dizziness, resp unlabored

## 2014-02-07 NOTE — ED Notes (Signed)
Dr. Venora Maples informed of pt's behaviors and pain-- ordered received.

## 2014-02-07 NOTE — ED Notes (Signed)
TELEPSYCHE WAS COMPLETED WITH  ANALYSIS THAT PATIENT MAY BE DISCHAGE TO FAMILY

## 2014-02-07 NOTE — ED Provider Notes (Signed)
Patient initially seen and evaluated by Dr. Venora Maples. Psychiatric evaluation has been done and they do not feel that he needs inpatient care. Patient is seen and evaluated by myself. He is not suicidal and he feels safe at home. He is discharged with instructions to followup with PCP.  Delora Fuel, MD [id number]/11/[phone]

## 2014-02-07 NOTE — ED Notes (Signed)
New sign language interpreter at bedside- Megan

## 2014-02-07 NOTE — ED Notes (Signed)
telepsych set up in room and in progress- interpreter at bedside for telepsych

## 2014-02-07 NOTE — ED Notes (Signed)
Received pt via EMS with c/o pt was at vet where his dog died. Pt reported to Vet that he was going to take the Dog leash and hang himself outside. [employer] called to scene. Pt had leash wrapped around neck and over a tree limb and leaned back. [employer] seen events and removed pt from tree limb and leash. Pt AAOx3. Pt able to talk in complete sentences without difficulty but Pt is deaf.

## 2014-02-07 NOTE — ED Notes (Signed)
Pt in xray at present

## 2014-02-07 NOTE — ED Notes (Signed)
New sign lang interpreter at bedside that is able to interpret mental health services; called [facility] and notified that he is at bedside; states she will call back when ready to set up telepsych

## 2014-02-07 NOTE — ED Notes (Signed)
Assumed care of pt from K. Cobb RN
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
## 2014-02-07 NOTE — ED Notes (Signed)
Pt laid down on floor stating "I am so dizzy and I feel like there is something in my chest- like it is bleeding-my ribs hurt" assisted pt up to bed, instructed not to get out of bed without help. [employer] officer and interpreter at bedside.
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
## 2014-02-07 NOTE — Discharge Instructions (Signed)
Adjustment Disorder  Most changes in life can cause stress. Getting used to changes may take a few months or longer. If feelings of stress, hopelessness, or worry continue, you may have an adjustment disorder. This stress-related mental health problem may affect your feelings, thinking and how you act. It occurs in both sexes and happens at any age.  SYMPTOMS   Some of the following problems may be seen and vary from person to person:  · Sadness or depression.  · Loss of enjoyment.  · Thoughts of suicide.  · Fighting.  · Avoiding family and friends.  · Poor school performance.  · Hopelessness, sense of loss.  · Trouble sleeping.  · Vandalism.  · Worry, weight loss or gain.  · Crying spells.  · Anxiety  · Reckless driving.  · Skipping school.  · Poor work performance.  · Nervousness.  · Ignoring bills.  · Poor attitude.  DIAGNOSIS   Your caregiver will ask what has happened in your life and do a physical exam. They will make a diagnosis of an adjustment disorder when they are sure another problem or medical illness causing your feelings does not exist.  TREATMENT   When problems caused by stress interfere with you daily life or last longer than a few months, you may need counseling for an adjustment disorder. Early treatment may diminish problems and help you to better cope with the stressful events in your life. Sometimes medication is necessary. Individual counseling and or support groups can be very helpful.  PROGNOSIS   Adjustment disorders usually last less than 3 to 6 months. The condition may persist if there is long lasting stress. This could include health problems, relationship problems, or job difficulties where you can not easily escape from what is causing the problem.  PREVENTION   Even the most mentally healthy, highly functioning people can suffer from an adjustment disorder given a significant blow from a life-changing event. There is no way to prevent pain and loss. Most people need help from time  to time. You are not alone.  SEEK MEDICAL CARE IF:   Your feelings or symptoms listed above do not improve or worsen.  Document Released: 08/20/2006 Document Revised: 03/09/2012 Document Reviewed: 11/11/2007  ExitCare® Patient Information ©2014 [employer].

## 2014-04-19 ENCOUNTER — Ambulatory Visit (INDEPENDENT_AMBULATORY_CARE_PROVIDER_SITE_OTHER): Payer: Medicare Other | Admitting: Neurology

## 2014-04-19 ENCOUNTER — Encounter: Payer: Self-pay | Admitting: Neurology

## 2014-04-19 VITALS — BP 129/81 | HR 90 | Wt 165.0 lb

## 2014-04-19 DIAGNOSIS — H538 Other visual disturbances: Secondary | ICD-10-CM

## 2014-04-19 DIAGNOSIS — H539 Unspecified visual disturbance: Secondary | ICD-10-CM

## 2014-04-19 NOTE — Progress Notes (Signed)
Reason for visit: Eye pain  Nathaniel [name] is an 53 y.o. male  History of present illness:  Nathaniel [name] is a 53 year old right-handed white male with a history of deafness. The patient has a two-month history of discomfort involving the right eye that is constant. The pain does not worsen with eye movement. He denies double vision, but he does have reports of blurring of vision, and fogging of the vision of the right eye only. He claims that the vision in the left eye is clear. The patient has undergone a visual evoked response test that is normal, and MRI of the brain shows mild white matter changes, and some ventriculomegaly. This is a stable finding. The patient has had no numbness or weakness of the extremities. The patient was referred by Dr. Bing Plume from ophthalmology, and no intraocular abnormalities were noted. The patient returns for an evaluation. Blood work done was unremarkable.  Past Medical History  Diagnosis Date  . Deaf     Call [phone] for patient's sign language interpreter.   . Barrett's esophagus     states "minor"  . Asthma     prn inhaler  . TIA (transient ischemic attack) 12/20/2012    no current deficits  . Seasonal allergies   . High cholesterol   . Heartburn     occasional, related to certain foods  . Arthritis     right knee  . 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 Bunionectomy Left 2012  . Knee arthroscopy Left 06/02/2012  . Knee arthroscopy Right fall 2013  . Inguinal hernia repair    . Cardiac catheterization  06/24/2011  . Knee arthroscopy Left 03/03/2013    Procedure: ARTHROSCOPY KNEE WITH CHONDROPLASTY PATELLA  AND LATERAL TIBIAL PLATEAU;  Surgeon: Kerin Salen, MD;  Location: [facility];  Service: Orthopedics;  Laterality: Left;  . Foot implant removal Right 07/15/2013    @ [facility]    Family History  Problem Relation Age of Onset  . Ovarian cancer Mother   . Coronary  artery disease Father     Social history:  reports that he has never smoked. He has never used smokeless tobacco. He reports that he does not drink alcohol or use illicit drugs.    Allergies  Allergen Reactions  . Simvastatin Other (See Comments)    DEPRESSION, VISUAL DISTURBANCE(FLASHING LIGHTS)  . Fish Allergy Rash    SOFTSHELL FISH    Medications:  Current Outpatient Prescriptions on File Prior to Visit  Medication Sig Dispense Refill  . albuterol (PROVENTIL) (2.5 MG/3ML) 0.083% nebulizer solution Take 3 mLs (2.5 mg total) by nebulization every 6 (six) hours as needed for wheezing.  75 mL  12  . amoxicillin-clavulanate (AUGMENTIN) 875-125 MG per tablet Take 1 tablet by mouth 2 (two) times daily.      Marland Kitchen esomeprazole (NEXIUM) 40 MG capsule Take 40 mg by mouth as needed.       . Fluticasone-Salmeterol (ADVAIR) 250-50 MCG/DOSE AEPB Inhale 1 puff into the lungs every 12 (twelve) hours.      . mometasone-formoterol (DULERA) 100-5 MCG/ACT AERO Inhale 1 puff into the lungs daily.      Marland Kitchen NASONEX 50 MCG/ACT nasal spray Place 2 sprays into the nose daily.        No current facility-administered medications on file prior to visit.    ROS:  Out of a complete 14 system review of symptoms, the patient  complains only of the following symptoms, and all other reviewed systems are negative.  Eye pain, blurred vision Wheezing   Blood pressure 129/81, pulse 90, weight 165 lb (74.844 kg).  Physical Exam  General: The patient is alert and cooperative at the time of the examination.  Skin: No significant peripheral edema is noted.   Neurologic Exam  Mental status: The patient is oriented x 3.  Cranial nerves: Facial symmetry is present. Speech is limited, dysarthric. Extraocular movements are full. Cover test is positive, some horizontal nasal deviation is seen with both eyes. The patient reports that objects appear to go up on the left eye is covered, and go down on the right eye is  covered. Visual fields are full. The patient is deaf.  Motor: The patient has good strength in all 4 extremities.  Sensory examination: Soft touch sensation is symmetric on the face, arms, and legs.  Coordination: The patient has good finger-nose-finger and heel-to-shin bilaterally.  Gait and station: The patient has a normal gait. Tandem gait is normal. Romberg is negative. No drift is seen.  Reflexes: Deep tendon reflexes are symmetric.   MRI brain 11/21/2013:  IMPRESSION: Abnormal MRI scan of brain showing mild changes of chronic microvascular ischemia and ventriculomegaly which are stable compared with MRI scan 12/21/12.     Assessment/Plan:  1. Right eye blurred vision, eye pain  2. Deafness  The etiology of the pain and blurring of vision of the right eye is not clear. The patient has a positive cover test, but there are no reports of double vision. The blurring of vision is associated with monocular vision with the right eye. The patient has had an ophthalmologic evaluation that was unremarkable. I will send the patient to Dr. Sanda Klein for a second opinion regarding his eye pain and blurring of vision that he believes is worsening over time.  Jill Alexanders MD 04/19/2014 8:04 PM  [facility] [address] [place], [place] [address]  Phone [phone] Fax [phone]

## 2014-05-09 ENCOUNTER — Telehealth: Payer: Self-pay | Admitting: Cardiology

## 2014-05-09 NOTE — Telephone Encounter (Signed)
Returned a call to patient  @ [phone] -Relay. Patient did not need assistance. He just wanted to confirm his 05/19/14 appointment @ 8:30. Nathaniel [name] this was all he needed. Call ended.

## 2014-05-09 NOTE — Telephone Encounter (Signed)
Waking up in the early am . With heart at an irregular heart beat ,and right arm is numb and feels like there is a stabbing in his back . Please call  At [phone] which connects to an interpreter  Thanks

## 2014-05-19 ENCOUNTER — Ambulatory Visit (INDEPENDENT_AMBULATORY_CARE_PROVIDER_SITE_OTHER): Payer: Medicare Other | Admitting: Cardiology

## 2014-05-19 ENCOUNTER — Encounter: Payer: Self-pay | Admitting: Cardiology

## 2014-05-19 VITALS — BP 124/66 | HR 77 | Ht 71.0 in | Wt 160.7 lb

## 2014-05-19 DIAGNOSIS — R002 Palpitations: Secondary | ICD-10-CM

## 2014-05-19 DIAGNOSIS — I493 Ventricular premature depolarization: Secondary | ICD-10-CM | POA: Insufficient documentation

## 2014-05-19 DIAGNOSIS — G459 Transient cerebral ischemic attack, unspecified: Secondary | ICD-10-CM

## 2014-05-19 DIAGNOSIS — IMO0001 Reserved for inherently not codable concepts without codable children: Secondary | ICD-10-CM | POA: Insufficient documentation

## 2014-05-19 DIAGNOSIS — R079 Chest pain, unspecified: Secondary | ICD-10-CM | POA: Insufficient documentation

## 2014-05-19 DIAGNOSIS — I4949 Other premature depolarization: Secondary | ICD-10-CM

## 2014-05-19 DIAGNOSIS — R0600 Dyspnea, unspecified: Secondary | ICD-10-CM

## 2014-05-19 DIAGNOSIS — Z0389 Encounter for observation for other suspected diseases and conditions ruled out: Secondary | ICD-10-CM

## 2014-05-19 DIAGNOSIS — R0989 Other specified symptoms and signs involving the circulatory and respiratory systems: Secondary | ICD-10-CM

## 2014-05-19 DIAGNOSIS — R0609 Other forms of dyspnea: Secondary | ICD-10-CM

## 2014-05-19 NOTE — Assessment & Plan Note (Signed)
Increased dyspnea last 3-4 weeks

## 2014-05-19 NOTE — Assessment & Plan Note (Signed)
"  Sharp" exertional

## 2014-05-19 NOTE — Patient Instructions (Addendum)
Your physician has recommended that you wear a holter monitor. Holter monitors are medical devices that record the heart's electrical activity. Doctors most often use these monitors to diagnose arrhythmias. Arrhythmias are problems with the speed or rhythm of the heartbeat. The monitor is a small, portable device. You can wear one while you do your normal daily activities. This is usually used to diagnose what is causing palpitations/syncope (passing out).  Your physician has requested that you have an echocardiogram. Echocardiography is a painless test that uses sound waves to create images of your heart. It provides your doctor with information about the size and shape of your heart and how well your heart's chambers and valves are working. This procedure takes approximately one hour. There are no restrictions for this procedure.  Follow-up with Kerin Ransom, PA-C after your tests are completed.  Palpitations  A palpitation is the feeling that your heartbeat is irregular or is faster than normal. It may feel like your heart is fluttering or skipping a beat. Palpitations are usually not a serious problem. However, in some cases, you may need further medical evaluation. CAUSES  Palpitations can be caused by:  Smoking.  Caffeine or other stimulants, such as diet pills or energy drinks.  Alcohol.  Stress and anxiety.  Strenuous physical activity.  Fatigue.  Certain medicines.  Heart disease, especially if you have a history of arrhythmias. This includes atrial fibrillation, atrial flutter, or supraventricular tachycardia.  An improperly working pacemaker or defibrillator. DIAGNOSIS  To find the cause of your palpitations, your caregiver will take your history and perform a physical exam. Tests may also be done, including:  Electrocardiography (ECG). This test records the heart's electrical activity.  Cardiac monitoring. This allows your caregiver to monitor your heart rate and rhythm in  real time.  Holter monitor. This is a portable device that records your heartbeat and can help diagnose heart arrhythmias. It allows your caregiver to track your heart activity for several days, if needed.  Stress tests by exercise or by giving medicine that makes the heart beat faster. TREATMENT  Treatment of palpitations depends on the cause of your symptoms and can vary greatly. Most cases of palpitations do not require any treatment other than time, relaxation, and monitoring your symptoms. Other causes, such as atrial fibrillation, atrial flutter, or supraventricular tachycardia, usually require further treatment. HOME CARE INSTRUCTIONS   Avoid:  Caffeinated coffee, tea, soft drinks, diet pills, and energy drinks.  Chocolate.  Alcohol.  Stop smoking if you smoke.  Reduce your stress and anxiety. Things that can help you relax include:  A method that measures bodily functions so you can learn to control them (biofeedback).  Yoga.  Meditation.  Physical activity such as swimming, jogging, or walking.  Get plenty of rest and sleep. SEEK MEDICAL CARE IF:   You continue to have a fast or irregular heartbeat beyond 24 hours.  Your palpitations occur more often. SEEK IMMEDIATE MEDICAL CARE IF:  You develop chest pain or shortness of breath.  You have a severe headache.  You feel dizzy, or you faint. MAKE SURE YOU:  Understand these instructions.  Will watch your condition.  Will get help right away if you are not doing well or get worse. Document Released: 12/13/2000 Document Revised: 04/12/2013 Document Reviewed: 02/14/2012 [facility] Patient Information 2014 [place].

## 2014-05-19 NOTE — Assessment & Plan Note (Signed)
PVCs, PACs

## 2014-05-19 NOTE — Assessment & Plan Note (Signed)
Negative, CT, MRI, CA dopplers 12/13

## 2014-05-19 NOTE — Progress Notes (Signed)
05/19/2014 Nathaniel [name]   [birth year]  [id number]  Primary Physicia Cammy Copa, MD Primary Cardiologist:   HPI:  Nathaniel [name] is a 53 year old male with a history of chronic deafness,and chronic chest pain syndrome. He was interviewed in the presence of a deaf interpretor. He has had prior catheterizations in 2006 and June 2012. These showed normal coronaries. He has had documented PACs and PVCs on previous monitoring. He had a possible TIA in Dec 2013- MRI, CT, echo, CA dopplers WNL. He has been seen by Dr Jannifer Franklin as an OP. In Feb 2015 he was seen by psychiatry service after a suicide gesture and visit to the ED. He was not admitted. He is in the office today with complaints of palpitations at night. They wake him up around 3-4 am. He describes sharp localized chest pain with this. He does not describe sustained tachycardia. He also says he has noticed DOE for the past month or so. He denies hemoptysis, calf swelling, or history of blood clots.     Current Outpatient Prescriptions  Medication Sig Dispense Refill  . albuterol (PROVENTIL) (2.5 MG/3ML) 0.083% nebulizer solution Take 3 mLs (2.5 mg total) by nebulization every 6 (six) hours as needed for wheezing.  75 mL  12  . amoxicillin-clavulanate (AUGMENTIN) 875-125 MG per tablet Take 1 tablet by mouth 2 (two) times daily.      Marland Kitchen esomeprazole (NEXIUM) 40 MG capsule Take 40 mg by mouth as needed.       . fluticasone (FLONASE) 50 MCG/ACT nasal spray Place 1 spray into both nostrils daily.      . Fluticasone-Salmeterol (ADVAIR) 250-50 MCG/DOSE AEPB Inhale 1 puff into the lungs every 12 (twelve) hours.      . mometasone-formoterol (DULERA) 100-5 MCG/ACT AERO Inhale 1 puff into the lungs daily.      Marland Kitchen NASONEX 50 MCG/ACT nasal spray Place 2 sprays into the nose daily.        No current facility-administered medications for this visit.    Allergies  Allergen Reactions  . Simvastatin Other (See Comments)    DEPRESSION, VISUAL  DISTURBANCE(FLASHING LIGHTS)  . Fish Allergy Rash    SOFTSHELL FISH    History   Social History  . Marital Status: Married    Spouse Name: N/A    Number of Children: 1  . Years of Education: college   Occupational History  . disability    Social History Main Topics  . Smoking status: Never Smoker   . Smokeless tobacco: Never Used  . Alcohol Use: No  . Drug Use: No  . Sexual Activity: Not on file   Other Topics Concern  . Not on file   Social History Narrative  . No narrative on file     Review of Systems: General: negative for chills, fever, night sweats or weight changes.  Cardiovascular: negative for chest pain, dyspnea on exertion, edema, orthopnea, palpitations, paroxysmal nocturnal dyspnea or shortness of breath Dermatological: negative for rash Respiratory: negative for cough or wheezing Urologic: negative for hematuria Abdominal: negative for nausea, vomiting, diarrhea, bright red blood per rectum, melena, or hematemesis Neurologic: negative for visual changes, syncope, or dizziness All other systems reviewed and are otherwise negative except as noted above.    Blood pressure 124/66, pulse 77, height 5\' 11"  (1.803 m), weight 160 lb 11.2 oz (72.893 kg).  General appearance: alert, cooperative and no distress Neck: no carotid bruit and no JVD Lungs: clear to auscultation bilaterally Heart: regular rate and rhythm  Extremities: extremities normal, atraumatic, no cyanosis or edema  EKG NSR  ASSESSMENT AND PLAN:   Palpitations His main complaint today is palpitations at night  TIA (transient ischemic attack) Negative, CT, MRI, CA dopplers 12/13  Normal coronary arteries- 2006 and June 2012 Normal echo 12/13  PVC (premature ventricular contraction) PVCs, PACs  Chest pain "Sharp" exertional  Dyspnea Increased dyspnea last 3-4 weeks    PLAN  Check event monitor and echo. F/U with MD 4-6- weeks.   Doreene Burke [facility] 05/19/2014 1:28 PM

## 2014-05-19 NOTE — Assessment & Plan Note (Addendum)
Normal echo 12/13

## 2014-05-19 NOTE — Assessment & Plan Note (Signed)
His main complaint today is palpitations at night

## 2014-05-26 ENCOUNTER — Inpatient Hospital Stay (HOSPITAL_COMMUNITY): Admission: RE | Admit: 2014-05-26 | Payer: Self-pay | Source: Ambulatory Visit

## 2014-06-01 ENCOUNTER — Ambulatory Visit: Payer: Self-pay | Admitting: Cardiology

## 2014-06-10 ENCOUNTER — Emergency Department (HOSPITAL_COMMUNITY): Payer: PRIVATE HEALTH INSURANCE

## 2014-06-10 ENCOUNTER — Emergency Department (HOSPITAL_COMMUNITY)
Admission: EM | Admit: 2014-06-10 | Discharge: 2014-06-10 | Disposition: A | Discharge status: Home / Self Care | Payer: PRIVATE HEALTH INSURANCE | Attending: Emergency Medicine | Admitting: Emergency Medicine

## 2014-06-10 ENCOUNTER — Encounter (HOSPITAL_COMMUNITY): Payer: Self-pay | Admitting: Emergency Medicine

## 2014-06-10 DIAGNOSIS — R0602 Shortness of breath: Secondary | ICD-10-CM | POA: Diagnosis not present

## 2014-06-10 DIAGNOSIS — R079 Chest pain, unspecified: Secondary | ICD-10-CM | POA: Diagnosis present

## 2014-06-10 DIAGNOSIS — Z8719 Personal history of other diseases of the digestive system: Secondary | ICD-10-CM | POA: Insufficient documentation

## 2014-06-10 DIAGNOSIS — J45909 Unspecified asthma, uncomplicated: Secondary | ICD-10-CM | POA: Insufficient documentation

## 2014-06-10 DIAGNOSIS — Z8739 Personal history of other diseases of the musculoskeletal system and connective tissue: Secondary | ICD-10-CM | POA: Diagnosis not present

## 2014-06-10 DIAGNOSIS — K2270 Barrett's esophagus without dysplasia: Secondary | ICD-10-CM | POA: Diagnosis not present

## 2014-06-10 DIAGNOSIS — G459 Transient cerebral ischemic attack, unspecified: Secondary | ICD-10-CM | POA: Diagnosis not present

## 2014-06-10 DIAGNOSIS — R12 Heartburn: Secondary | ICD-10-CM | POA: Insufficient documentation

## 2014-06-10 DIAGNOSIS — R002 Palpitations: Secondary | ICD-10-CM

## 2014-06-10 DIAGNOSIS — Z9889 Other specified postprocedural states: Secondary | ICD-10-CM | POA: Diagnosis not present

## 2014-06-10 DIAGNOSIS — Z79899 Other long term (current) drug therapy: Secondary | ICD-10-CM | POA: Insufficient documentation

## 2014-06-10 LAB — I-STAT TROPONIN, ED: Troponin i, poc: 0.01 ng/mL (ref 0.00–0.08)

## 2014-06-10 LAB — URINALYSIS, ROUTINE W REFLEX MICROSCOPIC
BILIRUBIN URINE: NEGATIVE
GLUCOSE, UA: NEGATIVE mg/dL
KETONES UR: NEGATIVE mg/dL
Leukocytes, UA: NEGATIVE
NITRITE: NEGATIVE
Protein, ur: NEGATIVE mg/dL
SPECIFIC GRAVITY, URINE: 1.012 (ref 1.005–1.030)
Urobilinogen, UA: 0.2 mg/dL (ref 0.0–1.0)
pH: 6.5 (ref 5.0–8.0)

## 2014-06-10 LAB — CBC
HCT: 43.5 % (ref 39.0–52.0)
Hemoglobin: 14.6 g/dL (ref 13.0–17.0)
MCH: 31 pg (ref 26.0–34.0)
MCHC: 33.6 g/dL (ref 30.0–36.0)
MCV: 92.4 fL (ref 78.0–100.0)
Platelets: 185 10*3/uL (ref 150–400)
RBC: 4.71 MIL/uL (ref 4.22–5.81)
RDW: 12.6 % (ref 11.5–15.5)
WBC: 6.8 10*3/uL (ref 4.0–10.5)

## 2014-06-10 LAB — BASIC METABOLIC PANEL
BUN: 20 mg/dL (ref 6–23)
CALCIUM: 9.2 mg/dL (ref 8.4–10.5)
CO2: 24 meq/L (ref 19–32)
CREATININE: 0.77 mg/dL (ref 0.50–1.35)
Chloride: 100 mEq/L (ref 96–112)
Glucose, Bld: 95 mg/dL (ref 70–99)
Potassium: 4.2 mEq/L (ref 3.7–5.3)
SODIUM: 139 meq/L (ref 137–147)

## 2014-06-10 LAB — URINE MICROSCOPIC-ADD ON

## 2014-06-10 MED ORDER — METOPROLOL TARTRATE 25 MG PO TABS: 25.0000 mg | ORAL_TABLET | Freq: Two times a day (BID) | ORAL | Status: DC

## 2014-06-10 MED ORDER — MORPHINE SULFATE 4 MG/ML IJ SOLN
4.0000 mg | Freq: Once | INTRAMUSCULAR | Status: AC
  Administered 2014-06-10: 4 mg via INTRAVENOUS
  Filled 2014-06-10: qty 1

## 2014-06-10 NOTE — Progress Notes (Signed)
Contacted [facility] at [phone]. There was only one triggered event this morning at 7am, the rhythm was sinus without significant PVCs. It is unclear if patient has been wearing the monitor last night or if there was no events last night. According to the patient, his device is down to the last battery and he is low on sticky pads. [facility] has been notified and will send him more battery and pads. He can also contact our [place] office to get more battery and pads. The case has been discussed with Dr. Haroldine Laws who believe patient is stable for discharge at this time. Patient has been advised to seek medical attention if have recurrent chest pain lasting longer than 20 minutes or having shortness of breath or severe dizziness.

## 2014-06-10 NOTE — Discharge Instructions (Signed)
Palpitations   A palpitation is the feeling that your heartbeat is irregular or is faster than normal. It may feel like your heart is fluttering or skipping a beat. Palpitations are usually not a serious problem. However, in some cases, you may need further medical evaluation.  CAUSES   Palpitations can be caused by:   Smoking.   Caffeine or other stimulants, such as diet pills or energy drinks.   Alcohol.   Stress and anxiety.   Strenuous physical activity.   Fatigue.   Certain medicines.   Heart disease, especially if you have a history of arrhythmias. This includes atrial fibrillation, atrial flutter, or supraventricular tachycardia.   An improperly working pacemaker or defibrillator.  DIAGNOSIS   To find the cause of your palpitations, your caregiver will take your history and perform a physical exam. Tests may also be done, including:   Electrocardiography (ECG). This test records the heart's electrical activity.   Cardiac monitoring. This allows your caregiver to monitor your heart rate and rhythm in real time.   Holter monitor. This is a portable device that records your heartbeat and can help diagnose heart arrhythmias. It allows your caregiver to track your heart activity for several days, if needed.   Stress tests by exercise or by giving medicine that makes the heart beat faster.  TREATMENT   Treatment of palpitations depends on the cause of your symptoms and can vary greatly. Most cases of palpitations do not require any treatment other than time, relaxation, and monitoring your symptoms. Other causes, such as atrial fibrillation, atrial flutter, or supraventricular tachycardia, usually require further treatment.  HOME CARE INSTRUCTIONS    Avoid:   Caffeinated coffee, tea, soft drinks, diet pills, and energy drinks.   Chocolate.   Alcohol.   Stop smoking if you smoke.   Reduce your stress and anxiety. Things that can help you relax include:   A method that measures bodily functions so  you can learn to control them (biofeedback).   Yoga.   Meditation.   Physical activity such as swimming, jogging, or walking.   Get plenty of rest and sleep.  SEEK MEDICAL CARE IF:    You continue to have a fast or irregular heartbeat beyond 24 hours.   Your palpitations occur more often.  SEEK IMMEDIATE MEDICAL CARE IF:   You develop chest pain or shortness of breath.   You have a severe headache.   You feel dizzy, or you faint.  MAKE SURE YOU:   Understand these instructions.   Will watch your condition.   Will get help right away if you are not doing well or get worse.  Document Released: 12/13/2000 Document Revised: 04/12/2013 Document Reviewed: 02/14/2012  [employer] Patient Information 2014 [employer].

## 2014-06-10 NOTE — ED Provider Notes (Signed)
Medical screening examination/treatment/procedure(s) were performed by non-physician practitioner and as supervising physician I was immediately available for consultation/collaboration.   EKG Interpretation   Date/Time:  Friday June 10 2014 08:48:01 EDT Ventricular Rate:  78 PR Interval:  133 QRS Duration: 85 QT Interval:  371 QTC Calculation: 423 R Axis:   31 Text Interpretation:  Sinus rhythm Abnormal R-wave progression, early  transition Minimal ST elevation, anterior leads Baseline wander in lead(s)  V1 No significant change since last tracing Confirmed by Maryan Rued  MD,  Loree Fee ([id number]) on 06/10/2014 8:53:30 AM        Blanchie Dessert, MD 06/10/14 1532

## 2014-06-10 NOTE — Consult Note (Signed)
CARDIOLOGY CONSULT NOTE   Patient ID: Nathaniel [name] MRN: [id number], DOB/AGE: 1961-11-16   Admit date: 06/10/2014 Date of Consult: 06/10/2014   Primary Physician: Cammy Copa, MD Primary Cardiologist: Dr. Ellyn Hack  Pt. Profile  53 yo male with h/o chronic chest pain symptom and normal coronary on cath in 2012 present with worsening palpitation for the past 2 month.  Problem List  Past Medical History  Diagnosis Date  . Deaf     Call [phone] for patient's sign language interpreter.   . Barrett's esophagus     states "minor"  . Asthma     prn inhaler  . TIA (transient ischemic attack) 12/20/2012    no current deficits  . Seasonal allergies   . High cholesterol   . Heartburn     occasional, related to certain foods  . Arthritis     right knee  . 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 Bunionectomy Left 2012  . Knee arthroscopy Left 06/02/2012  . Knee arthroscopy Right fall 2013  . Inguinal hernia repair    . Cardiac catheterization  06/24/2011  . Knee arthroscopy Left 03/03/2013    Procedure: ARTHROSCOPY KNEE WITH CHONDROPLASTY PATELLA  AND LATERAL TIBIAL PLATEAU;  Surgeon: Kerin Salen, MD;  Location: [facility];  Service: Orthopedics;  Laterality: Left;  . Foot implant removal Right 07/15/2013    @ [facility]     Allergies  Allergies  Allergen Reactions  . Simvastatin Other (See Comments)    DEPRESSION, VISUAL DISTURBANCE(FLASHING LIGHTS)  . Dust Mite Extract   . Pollen Extract   . 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 He had a normal coronary on previous cardiac catheterization in 2006 and June 2012. He also had some documented PAC and PVC on previous monitoring. He was seen by one of  our APP in the office on 05/19/2014 at which time he was complaining of a three-week onset of palpitations. He was placed on a 30 day event monitor. He denies any recent fever, chill, LE edema, orthopnea or PND. He does have significant snoring at night, however no previous sleep study.  Patient woke up around 4 am on 06/10/2014 with palpitation and SOB. He tried to go back to sleep, however he woke shortly after with worsening symptom and some chest discomfort. He states he does not think he was having a heart attack, however he felt his heart was in a irregular rhythm which last 1-2 minutes at a time. He also endorsed some lightheadedness during the episodes. He asked his wife to call 911. En route, he was given 3 nitroglycerine and his chest discomfort which was associated to irregular heart rate seems to have eased off. Initial labs were normal. Troponin negative. Vitals stable with SBP in 120s. CXR was negative. EKG showed some repolarization in anterior lead, there is also some minimal ST elevation in lead II and aVF which was present in his previous EKG in Aug 2014. Cardiology was consulted.   Inpatient Medications    Family History Family History  Problem Relation Age of Onset  . Ovarian cancer Mother   . Coronary artery disease Father      Social History History   Social History  .  Marital Status: Married    Spouse Name: N/A    Number of Children: 1  . Years of Education: college   Occupational History  . disability    Social History Main Topics  . Smoking status: Never Smoker   . Smokeless tobacco: Never Used  . Alcohol Use: No  . Drug Use: No  . Sexual Activity: Not on file   Other Topics Concern  . Not on file   Social History Narrative  . No narrative on file     Review of Systems  General:  No chills, fever, night sweats or weight changes.  Cardiovascular:  No dyspnea on exertion, edema, orthopnea, palpitations, paroxysmal nocturnal dyspnea. + mild chest  discomfort associated with irregular heart beat. Irregular heart beat last 1-2 minutes each. Dermatological: No rash, lesions/masses Respiratory: No cough, dyspnea Urologic: No hematuria, dysuria Abdominal:   No nausea, vomiting, diarrhea, bright red blood per rectum, melena, or hematemesis Neurologic:  No visual changes, wkns, changes in mental status. All other systems reviewed and are otherwise negative except as noted above.  Physical Exam  Blood pressure 123/82, pulse 70, temperature 98.4 F (36.9 C), temperature source Oral, resp. rate 17, SpO2 96.00%.  General: Pleasant, NAD. Speak using sign language Psych: Normal affect. Neuro: Alert and oriented X 3. Moves all extremities spontaneously. HEENT: Normal  Neck: Supple without bruits or JVD. Lungs:  Resp regular and unlabored, CTA. Heart: RRR no s3, s4, or murmurs. Abdomen: Soft, non-tender, non-distended, BS + x 4.  Extremities: No clubbing, cyanosis or edema. DP/PT/Radials 2+ and equal bilaterally.  Labs  No results found for this basename: CKTOTAL, CKMB, TROPONINI,  in the last 72 hours Lab Results  Component Value Date   WBC 6.8 06/10/2014   HGB 14.6 06/10/2014   HCT 43.5 06/10/2014   MCV 92.4 06/10/2014   PLT 185 06/10/2014    Recent Labs Lab 06/10/14 0936  NA 139  K 4.2  CL 100  CO2 24  BUN 20  CREATININE 0.77  CALCIUM 9.2  GLUCOSE 95   Lab Results  Component Value Date   CHOL 180 12/21/2012   HDL 36* 12/21/2012   LDLCALC 112* 12/21/2012   TRIG 160* 12/21/2012   No results found for this basename: DDIMER    Radiology/Studies  Dg Chest Portable 1 View  06/10/2014   CLINICAL DATA:  Chest pain, history asthma  EXAM: PORTABLE CHEST - 1 VIEW  COMPARISON:  Portable exam 0911 hr compared to 02/07/2014  FINDINGS: Upper normal heart size.  Normal mediastinal contours and pulmonary vascularity.  Lungs clear.  No pleural effusion or pneumothorax.  Bones unremarkable.  IMPRESSION: No acute abnormalities.    Electronically Signed   By: Lavonia Dana M.D.   On: 06/10/2014 09:24    ECG  NSR with early repolarization in anterior lead, minimal ST elevation in lead II and aVF which is present in his previous EKG in May 2015 and Aug 2014  ASSESSMENT AND PLAN  1. Palpitation  - [facility] office contacted, will get record from [employer] company prior to patient's follow up  - Add lopressor 25mg  BID to patient regimen   - ACS unlikely given normal coronary in 2012. Troponin negative. EKG no change compare to previous EKG  - follow up with Dr. Ellyn Hack on 06/15/2014 who will review the event monitor reading  - patient is stable to discharge from ED from cardiology perspective  2. Possible OSA   - contacted Maryelizabeth Rowan of Pulmonology, who recommended  patient to call [facility] scheduling line at [phone] to schedule outpatient sleep study to rule out OSA    Signed, Almyra Deforest, PA-C 06/10/2014, 1:10 PM  Patient seen and examined with Mr. Ayesha Mohair. We discussed all aspects of the encounter. I agree with the assessment and plan as stated above.   Suspect he is having symptomatic PACs/PVCs. ECG and troponin are normal. Normal cath in 2012. He has been wearing an event monitor and no events to date. He describes significant OSA symptoms. Will contact event monitor company to confirm no significant events. If clear, can go home with lopressor 25 bid and outpatient sleep study.  Limit caffeine.   Eliya Bubar,MD 1:58 PM

## 2014-06-10 NOTE — ED Notes (Addendum)
Cardiology PA at bedside. 

## 2014-06-10 NOTE — ED Notes (Signed)
Provider at the bedside.  

## 2014-06-10 NOTE — ED Notes (Signed)
Pt to ED from home via [facility] c/o chest pain that started at 4am. Per EMS, pt was lying in bed when he felt pain in his chest, pain 5/10.  Pt currently wearing halter monitor for irregular heart rate. Pt took 325mg  ASA at home and Nitro x3; pain at 2/10 after nitro. Pt also c/o L leg pain during episode that subsided with nitro.

## 2014-06-10 NOTE — ED Provider Notes (Signed)
CSN: [id number]     Arrival date & time 06/10/14  0841 History   First MD Initiated Contact with Patient 06/10/14 [phone]     Chief Complaint  Patient presents with  . Chest Pain     (Consider location/radiation/quality/duration/timing/severity/associated sxs/prior Treatment) HPI Comments: Patient presents emergency department with chest pain that awoke him from sleep this morning at 4 AM. He is deaf, and the transverse immediately available, history is provided through writing. He states that he is lying in bed when he felt pain on the left side of his chest. He reports pain was 5/10. He reports associated shortness of breath, but no diaphoresis. He writes that he has had a history of MI in 2012. He was given 3 nitroglycerin by EMS, as well as aspirin. He gestures that his pain is now 2/10. He gestures that the pain radiated to the left arm, but not to the jaw.  The history is provided by the patient. The history is limited by a language barrier. No language interpreter was used.    Past Medical History  Diagnosis Date  . Deaf     Call [phone] for patient's sign language interpreter.   . Barrett's esophagus     states "minor"  . Asthma     prn inhaler  . TIA (transient ischemic attack) 12/20/2012    no current deficits  . Seasonal allergies   . High cholesterol   . Heartburn     occasional, related to certain foods  . Arthritis     right knee  . 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 Bunionectomy Left 2012  . Knee arthroscopy Left 06/02/2012  . Knee arthroscopy Right fall 2013  . Inguinal hernia repair    . Cardiac catheterization  06/24/2011  . Knee arthroscopy Left 03/03/2013    Procedure: ARTHROSCOPY KNEE WITH CHONDROPLASTY PATELLA  AND LATERAL TIBIAL PLATEAU;  Surgeon: Kerin Salen, MD;  Location: [facility];  Service: Orthopedics;  Laterality: Left;  . Foot implant removal Right 07/15/2013    @ [facility]    Family History  Problem Relation Age of Onset  . Ovarian cancer Mother   . Coronary artery disease Father    History  Substance Use Topics  . Smoking status: Never Smoker   . Smokeless tobacco: Never Used  . Alcohol Use: No    Review of Systems  Constitutional: Negative for fever and chills.  Respiratory: Positive for shortness of breath.   Cardiovascular: Positive for chest pain.  Gastrointestinal: Negative for nausea, vomiting, diarrhea and constipation.  Genitourinary: Negative for dysuria.  All other systems reviewed and are negative.     Allergies  Simvastatin; Dust mite extract; Pollen extract; and Fish allergy  Home Medications   Prior to Admission medications   Medication Sig Start Date End Date Taking? Authorizing Provider  albuterol (PROVENTIL) (2.5 MG/3ML) 0.083% nebulizer solution Take 3 mLs (2.5 mg total) by nebulization every 6 (six) hours as needed for wheezing. 08/24/13   Johnna Acosta, MD  amoxicillin-clavulanate (AUGMENTIN) 875-125 MG per tablet Take 1 tablet by mouth 2 (two) times daily. 02/04/14   Historical Provider, MD  esomeprazole (NEXIUM) 40 MG capsule Take 40 mg by mouth as needed.     Historical Provider, MD  fluticasone (FLONASE) 50 MCG/ACT nasal spray Place 1 spray into both nostrils daily. 02/04/14   Historical Provider, MD  Fluticasone-Salmeterol (ADVAIR) 250-50 MCG/DOSE AEPB Inhale 1 puff into the lungs  every 12 (twelve) hours.    Historical Provider, MD  mometasone-formoterol (DULERA) 100-5 MCG/ACT AERO Inhale 1 puff into the lungs daily.    Historical Provider, MD  NASONEX 50 MCG/ACT nasal spray Place 2 sprays into the nose daily.  09/21/13   Historical Provider, MD   SpO2 97% Physical Exam  Nursing note and vitals reviewed. Constitutional: He is oriented to person, place, and time. He appears well-developed and well-nourished.  HENT:  Head: Normocephalic and atraumatic.  Eyes: Conjunctivae and EOM are normal. Pupils are equal, round, and  reactive to light. Right eye exhibits no discharge. Left eye exhibits no discharge. No scleral icterus.  Neck: Normal range of motion. Neck supple. No JVD present.  Cardiovascular: Normal rate, regular rhythm and normal heart sounds.  Exam reveals no gallop and no friction rub.   No murmur heard. Pulmonary/Chest: Effort normal and breath sounds normal. No respiratory distress. He has no wheezes. He has no rales. He exhibits no tenderness.  Abdominal: Soft. He exhibits no distension and no mass. There is no tenderness. There is no rebound and no guarding.  Musculoskeletal: Normal range of motion. He exhibits no edema and no tenderness.  Neurological: He is alert and oriented to person, place, and time.  Skin: Skin is warm and dry.  Psychiatric: He has a normal mood and affect. His behavior is normal. Judgment and thought content normal.    ED Course  Procedures (including critical care time) Results for orders placed during the hospital encounter of 06/10/14  CBC      Result Value Ref Range   WBC 6.8  4.0 - 10.5 K/uL   RBC 4.71  4.22 - 5.81 MIL/uL   Hemoglobin 14.6  13.0 - 17.0 g/dL   HCT 43.5  39.0 - 52.0 %   MCV 92.4  78.0 - 100.0 fL   MCH 31.0  26.0 - 34.0 pg   MCHC 33.6  30.0 - 36.0 g/dL   RDW 12.6  11.5 - 15.5 %   Platelets 185  150 - 400 K/uL  URINALYSIS, ROUTINE W REFLEX MICROSCOPIC      Result Value Ref Range   Color, Urine STRAW (*) YELLOW   APPearance CLEAR  CLEAR   Specific Gravity, Urine 1.012  1.005 - 1.030   pH 6.5  5.0 - 8.0   Glucose, UA NEGATIVE  NEGATIVE mg/dL   Hgb urine dipstick SMALL (*) NEGATIVE   Bilirubin Urine NEGATIVE  NEGATIVE   Ketones, ur NEGATIVE  NEGATIVE mg/dL   Protein, ur NEGATIVE  NEGATIVE mg/dL   Urobilinogen, UA 0.2  0.0 - 1.0 mg/dL   Nitrite NEGATIVE  NEGATIVE   Leukocytes, UA NEGATIVE  NEGATIVE  BASIC METABOLIC PANEL      Result Value Ref Range   Sodium 139  137 - 147 mEq/L   Potassium 4.2  3.7 - 5.3 mEq/L   Chloride 100  96 - 112  mEq/L   CO2 24  19 - 32 mEq/L   Glucose, Bld 95  70 - 99 mg/dL   BUN 20  6 - 23 mg/dL   Creatinine, Ser 0.77  0.50 - 1.35 mg/dL   Calcium 9.2  8.4 - 10.5 mg/dL   GFR calc non Af Amer >90  >90 mL/min   GFR calc Af Amer >90  >90 mL/min  URINE MICROSCOPIC-ADD ON      Result Value Ref Range   Squamous Epithelial / LPF RARE  RARE   RBC / HPF 3-6  <3 RBC/hpf  Bacteria, UA RARE  RARE  I-STAT TROPOININ, ED      Result Value Ref Range   Troponin i, poc 0.01  0.00 - 0.08 ng/mL   Comment 3            Dg Chest Portable 1 View  06/10/2014   CLINICAL DATA:  Chest pain, history asthma  EXAM: PORTABLE CHEST - 1 VIEW  COMPARISON:  Portable exam 0911 hr compared to 02/07/2014  FINDINGS: Upper normal heart size.  Normal mediastinal contours and pulmonary vascularity.  Lungs clear.  No pleural effusion or pneumothorax.  Bones unremarkable.  IMPRESSION: No acute abnormalities.   Electronically Signed   By: Lavonia Dana M.D.   On: 06/10/2014 09:24      EKG Interpretation   Date/Time:  Friday June 10 2014 08:48:01 EDT Ventricular Rate:  78 PR Interval:  133 QRS Duration: 85 QT Interval:  371 QTC Calculation: 423 R Axis:   31 Text Interpretation:  Sinus rhythm Abnormal R-wave progression, early  transition Minimal ST elevation, anterior leads Baseline wander in lead(s)  V1 No significant change since last tracing Confirmed by Maryan Rued  MD,  Loree Fee ([id number]) on 06/10/2014 8:53:30 AM      MDM   Final diagnoses:  Chest pain    Patient with chest pain.  Started this morning.  Currently on a holter monitor.  Followed by Dr. Ellyn Hack.  Normal coronary arteries in 2012 on cath.  Negative troponin.  No significant changes in EKG.  2:22 PM Patient seen by and discussed with Cardiology, who recommend discharge to home.  Appropriate f/u arrangements have been made by cards.  2:45 PM Patient is clear from cardiology standpoint.  Will discharge to home with follow-up instructions.  Montine Circle,  PA-C 06/10/14 1446

## 2014-06-14 ENCOUNTER — Ambulatory Visit (HOSPITAL_COMMUNITY)
Admission: RE | Admit: 2014-06-14 | Discharge: 2014-06-14 | Disposition: A | Discharge status: Home / Self Care | Payer: PRIVATE HEALTH INSURANCE | Source: Ambulatory Visit | Attending: Cardiology | Admitting: Cardiology

## 2014-06-14 DIAGNOSIS — R079 Chest pain, unspecified: Secondary | ICD-10-CM

## 2014-06-14 DIAGNOSIS — R002 Palpitations: Secondary | ICD-10-CM

## 2014-06-14 DIAGNOSIS — I059 Rheumatic mitral valve disease, unspecified: Secondary | ICD-10-CM

## 2014-06-14 NOTE — Progress Notes (Signed)
2D Echo Performed 06/14/2014    Marygrace Drought, RCS

## 2014-06-15 ENCOUNTER — Telehealth: Payer: Self-pay | Admitting: Cardiology

## 2014-06-15 ENCOUNTER — Ambulatory Visit (INDEPENDENT_AMBULATORY_CARE_PROVIDER_SITE_OTHER): Payer: PRIVATE HEALTH INSURANCE | Admitting: Cardiology

## 2014-06-15 VITALS — BP 121/70 | HR 83 | Ht 71.0 in | Wt 165.9 lb

## 2014-06-15 DIAGNOSIS — G4700 Insomnia, unspecified: Secondary | ICD-10-CM

## 2014-06-15 DIAGNOSIS — I493 Ventricular premature depolarization: Secondary | ICD-10-CM

## 2014-06-15 DIAGNOSIS — R079 Chest pain, unspecified: Secondary | ICD-10-CM

## 2014-06-15 DIAGNOSIS — I471 Supraventricular tachycardia: Secondary | ICD-10-CM

## 2014-06-15 DIAGNOSIS — I4949 Other premature depolarization: Secondary | ICD-10-CM

## 2014-06-15 NOTE — Telephone Encounter (Signed)
I spoke with [employer] operator who informed the patient about the appointment with Dr. Lanny Hurst Clance----Friday 07/15/14 @ 2:00pm for consultation for sleep study.  I also relayed the address and phone # for Dr. Gwenette Greet.[place]

## 2014-06-15 NOTE — Patient Instructions (Addendum)
May use a METOPROLOL   IF YOU HAVE THE EXTRA BEATS , YOU CAN TRY COLD WATER,  BEARING DOWN, COUGHING  TO SEE IF IT BREAKS THE EXTRA BEAT.   Your physician recommends that you schedule a follow-up appointment in 3-4 [place] have been referred to  [facility]-( [place])   Your physician wants you to follow-up in   12  MONTH[facility]  DR HARDING-- [address].  You will receive a reminder letter in the mail two months in advance. If you don't receive a letter, please call our office to schedule the follow-up appointment. 12

## 2014-06-17 ENCOUNTER — Encounter: Payer: Self-pay | Admitting: Cardiology

## 2014-06-17 NOTE — Progress Notes (Signed)
PATIENT: Nathaniel [name] MRN: [id number]  DOB: [birth year]   DOV:06/18/2014 PCP: Cammy Copa, MD  Clinic Note: Chief Complaint  Patient presents with  . Follow-up    follow-up monitor and Echo    HPI: Nathaniel [name] is a 53 y.o.  male with a PMH below who presents today for post hospital followup. He actually saw Kerin Ransom back in May for her nighttime palpitations.he is currently wearing an event monitor and had an echocardiogram checked. In the interim, he went to the emergency room for evaluation of chest pain and ruled out for MI.  He was seen in consultation by Dr. Haroldine Laws -- symptoms were notable for having awakened with palpitations and dyspnea where he had a regular rhythm lasting about 1-2 minutes associated with lightheadedness. He ruled out for MI,  EKGwas mildly abnormal with repolarization changes in inferior leads.t  Addendum time since he had these symptoms before. I last saw him in June of 2013 and had not seen him since. Dr. Haroldine Laws felt that this was PACs and PVCs once it had been in the past and recommended low-dose beta blocker. He also recommend a sleep study as an outpatient. This has not yet been scheduled.he was discharged on low-dose beta blocker.  Interval History: he presents today now noting that his palpitations have gotten improved notably with the beta blocker. He is not having nearly the amount of fatigue he had before the beta blocker. His resolved himself to take it. He's been wearing the event monitor which showed sinus rhythm with PACs with short episodes of PSVT with rates up 150 beats a minute lasting less than 20 seconds he also has PVCs there intermittent.  Other than during these episodes, he denies any chest tightness or pressure with rest or exertion. No exertional dyspnea. No PND, orthopnea or edema. No syncope/near syncope or TIAs amaurosis fugax symptoms. No melena, hematochezia, hematuria. No claudication.  Past Medical History    Diagnosis Date  . Deaf     Call [phone] for patient's sign language interpreter.   . Barrett's esophagus     states "minor"  . Asthma     prn inhaler  . TIA (transient ischemic attack) 12/20/2012    no current deficits  . Seasonal allergies   . High cholesterol   . Heartburn     occasional, related to certain foods  . Arthritis     right knee  . 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 Intermittent palpitations     has had a pretty significant workup but no true arrhythmias being found as of 2013; Holter monitor with occasional bigeminy and trigeminy with quadrigeminy. Also noted supraventricular ectopy, but was intolerant of beta blockers at the time.    Prior Cardiac Evaluation and Past Surgical History: Past Surgical History  Procedure Laterality Date  . Bunionectomy Left 2012  . Knee arthroscopy Left 06/02/2012  . Knee arthroscopy Right fall 2013  . Inguinal hernia repair    . Cardiac catheterization  06/24/2011    wwidely patent normal coronary arteries with normal ejection fraction  . Knee arthroscopy Left 03/03/2013    Procedure: ARTHROSCOPY KNEE WITH CHONDROPLASTY PATELLA  AND LATERAL TIBIAL PLATEAU;  Surgeon: Kerin Salen, MD;  Location: [facility];  Service: Orthopedics;  Laterality: Left;  . Foot implant removal Right 07/15/2013    @ [place]  . Nm myoview ltd  2012    small fixed basal inferior artifact. Normal EF.  . Carotid artery dopplers  2012    tortuous but no stenoses.. No subclavian disease  . Upper and lower extremity arterial dopplers  2012    normal arterial flow bilateral upper extremities including subclavian is a carotid;;R. ABI 1.2, L. ABI 1.28. Normal flow velocities. Likely calcified vessels.  . Abdominal aortic ultrasound  2012    normal abdominal aorta  . Transthoracic echocardiogram  June2015    nnormal EF: 55-60%. No wall motion abnormalities. Gr 1 DD.  Mild MR. Otherwise normal    Allergies  Allergen Reactions  . Simvastatin  Other (See Comments)    DEPRESSION, VISUAL DISTURBANCE(FLASHING LIGHTS)  . Dust Mite Extract   . Pollen Extract   . Fish Allergy Rash    SOFTSHELL FISH    Current Outpatient Prescriptions  Medication Sig Dispense Refill  . albuterol (PROVENTIL) (2.5 MG/3ML) 0.083% nebulizer solution Take 3 mLs (2.5 mg total) by nebulization every 6 (six) hours as needed for wheezing.  75 mL  12  . aspirin EC 325 MG tablet Take 325 mg by mouth daily.      Marland Kitchen esomeprazole (NEXIUM) 40 MG capsule Take 40 mg by mouth daily as needed (for heartburn).       . fluticasone (FLONASE) 50 MCG/ACT nasal spray Place 1 spray into both nostrils daily.      . Fluticasone-Salmeterol (ADVAIR) 250-50 MCG/DOSE AEPB Inhale 1 puff into the lungs daily.       . metoprolol tartrate (LOPRESSOR) 25 MG tablet Take 1 tablet (25 mg total) by mouth 2 (two) times daily.  30 tablet  6  . mometasone-formoterol (DULERA) 100-5 MCG/ACT AERO Inhale 1 puff into the lungs daily.       No current facility-administered medications for this visit.    History   Social History Narrative   He is deaf, requiring an interpreter.   He started a new job back in 2013, which was much less stressful for him.  He has subsequently gone on to disability.   He is a married father of one. He previously wrote his bike 2 times a week.   Does not smoke or drink.          ROS: A comprehensive Review of Systems - Negative except symptoms noted in the history of present illness. Otherwise negative.  PHYSICAL EXAM BP 121/70  Pulse 83  Ht 5\' 11"  (1.803 m)  Wt 165 lb 14.4 oz (75.252 kg)  BMI 23.15 kg/m2 General: Pleasant, NAD. Speaks using sign language ; healthy appearing, but mildly disheveled. Psych: Normal affect.  Neuro: Alert and oriented X 3. Moves all extremities spontaneously.  HEENT: [place]/AT, EOMI, MMM Neck: Supple without bruits or JVD.  Lungs: CPAP, nonlabored, good air movement. No W./R./R. Except for mild extremity wheezing in the  bases. Heart: RRR no s3, s4, or murmurs.  Abdomen: Soft, non-tender, non-distended, BS + x 4.  Extremities: No C/C/E. DP/PT/Radials 2+ and equal bilaterally.    Adult ECG Report -  Not checked. He is currently still wearing the monitor  Recent Labs: from ER visit on June 12. Relatively normal labs.  ASSESSMENT / PLAN: PSVT (paroxysmal supraventricular tachycardia) Long-standing history of palpitations now come back being more bothersome to him. This most recent monitor showed  A true arrhythmia besides just PVCs and PACs. There was another short run of PAT/PSVT noted which means of this and the second one. Both episodes of very quick, did not last a full minute. However he has a substrate for arrhythmia. We discussed vagal maneuvers to break  episode last month time period of about using extra dose of Lopressor if his symptoms do worsen. We then take a higher dose for couple days. He seems to be more stable on the beta blocker but is still having short runs.  No structural abnormalities were noted on echo and has had a pretty straightforward negative evaluate for ischemia as delineated in his history of present illness.  PVC (premature ventricular contraction) Chronic diagnosis of PACs and PVCs. Either premature contraction could trigger the PSVT. Agree with low-dose beta blocker.  Chest pain Shock, nonexertional chest pain in patient with a very negative ischemia evaluation that is most certainly not anginal in nature. It may be associated with arrhythmia but that sounds more fleeting and then his symptoms.  My overall cause of this is probably musculoskeletal.  Insomnia Not sure if the issues and actually falling asleep for the fact that he does not feel well rested in the morning. There was definite concern for sleep disordered breathing/OSA.  The plan was for him to be referred to pulmonary medicine. We'll ensure that this referral has been made. Following this he has a hard time doing  things over the telephone because of his death. I will ask our office to assist in making this appointment. I will recommend as far as decisions for sleep study evaluation.    Orders Placed This Encounter  Procedures  . Ambulatory referral to Pulmonology   No orders of the defined types were placed in this encounter.    Mostly because of the language barrier with the patient and uses sign language interpreter, the interview is a very along session that lasts well over 30 minutes. Each CT of evaluation require extensive explanation, most notably the echocardiogram results and the monitor results.  Followup: 3-4 months with Kerin Ransom, PA. 1 year followup with Dr. Ellyn Hack. Referral for sleep study evaluation to pulmonary medicine.  DAVID W. Ellyn Hack, M.D., M.S. Interventional Cardiology [facility]

## 2014-06-18 DIAGNOSIS — G4700 Insomnia, unspecified: Secondary | ICD-10-CM | POA: Insufficient documentation

## 2014-06-18 DIAGNOSIS — I471 Supraventricular tachycardia: Secondary | ICD-10-CM | POA: Insufficient documentation

## 2014-06-18 NOTE — Assessment & Plan Note (Signed)
Not sure if the issues and actually falling asleep for the fact that he does not feel well rested in the morning. There was definite concern for sleep disordered breathing/OSA.  The plan was for him to be referred to pulmonary medicine. We'll ensure that this referral has been made. Following this he has a hard time doing things over the telephone because of his death. I will ask our office to assist in making this appointment. I will recommend as far as decisions for sleep study evaluation.

## 2014-06-18 NOTE — Assessment & Plan Note (Signed)
Chronic diagnosis of PACs and PVCs. Either premature contraction could trigger the PSVT. Agree with low-dose beta blocker.

## 2014-06-18 NOTE — Assessment & Plan Note (Signed)
Shock, nonexertional chest pain in patient with a very negative ischemia evaluation that is most certainly not anginal in nature. It may be associated with arrhythmia but that sounds more fleeting and then his symptoms.  My overall cause of this is probably musculoskeletal.

## 2014-06-18 NOTE — Assessment & Plan Note (Signed)
Long-standing history of palpitations now come back being more bothersome to him. This most recent monitor showed  A true arrhythmia besides just PVCs and PACs. There was another short run of PAT/PSVT noted which means of this and the second one. Both episodes of very quick, did not last a full minute. However he has a substrate for arrhythmia. We discussed vagal maneuvers to break episode last month time period of about using extra dose of Lopressor if his symptoms do worsen. We then take a higher dose for couple days. He seems to be more stable on the beta blocker but is still having short runs.  No structural abnormalities were noted on echo and has had a pretty straightforward negative evaluate for ischemia as delineated in his history of present illness.

## 2014-06-18 NOTE — Progress Notes (Signed)
Addendum:  First scheduling assistant: appointment with Dr. Lanny Hurst Clance----Friday 07/15/14 @ 2:00pm for consultation for sleep study. I also relayed the address and phone # for Dr. Gwenette Greet  Excellent quick work.  Leonie Man, MD

## 2014-06-21 NOTE — Progress Notes (Signed)
I have attempted to call both numbers listed for this pt. And neither were numbers that i could get him on or leave a message

## 2014-06-22 NOTE — Progress Notes (Signed)
Quick Note:  Cardiac Event Monitor Result: Mostly NSR - with intermittent PACs, 2 brief runs of PAT/NSVT  Love Chowning W, MD  ______

## 2014-06-29 ENCOUNTER — Ambulatory Visit: Payer: Medicare Other | Admitting: Neurology

## 2014-07-15 ENCOUNTER — Encounter: Payer: Self-pay | Admitting: Pulmonary Disease

## 2014-07-15 ENCOUNTER — Ambulatory Visit (INDEPENDENT_AMBULATORY_CARE_PROVIDER_SITE_OTHER): Payer: Medicare Other | Admitting: Pulmonary Disease

## 2014-07-15 VITALS — BP 120/74 | HR 75 | Ht 71.0 in | Wt 168.4 lb

## 2014-07-15 DIAGNOSIS — G4733 Obstructive sleep apnea (adult) (pediatric): Secondary | ICD-10-CM

## 2014-07-15 NOTE — Progress Notes (Signed)
   Subjective:    Patient ID: Nathaniel [name], male    DOB: May 29, 1961, 53 y.o.   MRN: [id number]  HPI The patient is a 53 year old male who I've been asked to see for possible obstructive sleep apnea. He has been noted to have loud snoring, as well as an abnormal breathing pattern during sleep. He also admits to having choking arousals. He only awakens 1 time at night, and feels fairly rested in the mornings upon arising. However, he notes significant sleep pressure during the day with inactivity, and can fall asleep reading or watching television. He also notes some sleepiness with driving, and will have to stop and get something to it or drink in order to restore alertness. The patient states that his weight is been fairly stable over the last 2 years.   Sleep Questionnaire What time do you typically go to bed?( Between what hours) 9 pm 9 pm at 1414 on 07/15/14 by Lu Duffel How long does it take you to fall asleep? 5 minutes 5 minutes at 1414 on 07/15/14 by Lu Duffel How many times during the night do you wake up? 1 1 at 1414 on 07/15/14 by Lu Duffel What time do you get out of bed to start your day? 0600 0600 at 1414 on 07/15/14 by Lu Duffel Do you drive or operate heavy machinery in your occupation? No No at 1414 on 07/15/14 by Lu Duffel How much has your weight changed (up or down) over the past two years? (In pounds) 4 lb (1.814 kg) 4 lb (1.814 kg) at 1414 on 07/15/14 by Lu Duffel Have you ever had a sleep study before? No No at 1414 on 07/15/14 by Lu Duffel Do you currently use CPAP? No No at 1414 on 07/15/14 by Lu Duffel Do you wear oxygen at any time? No No at 1414 on 07/15/14 by Lu Duffel   Review of Systems  Constitutional: Negative for fever and unexpected weight change.  HENT: Negative for congestion, dental problem, ear pain, nosebleeds, postnasal drip, rhinorrhea, sinus pressure, sneezing,  sore throat and trouble swallowing.   Eyes: Negative for redness and itching.  Respiratory: Negative for cough, chest tightness, shortness of breath and wheezing.   Cardiovascular: Positive for palpitations. Negative for leg swelling.  Gastrointestinal: Negative for nausea and vomiting.  Genitourinary: Negative for dysuria.  Musculoskeletal: Negative for joint swelling.  Skin: Negative for rash.  Neurological: Negative for headaches.  Hematological: Does not bruise/bleed easily.  Psychiatric/Behavioral: Negative for dysphoric mood. The patient is not nervous/anxious.        Objective:   Physical Exam Constitutional:  Well developed, no acute distress  HENT:  Nares patent without discharge, but narrowed bilat.  Oropharynx without exudate, palate and uvula are significantly elongated.  Eyes:  Perrla, eomi, no scleral icterus  Neck:  No JVD, no TMG  Cardiovascular:  Normal rate, regular rhythm, no rubs or gallops.  1/6 sem        Intact distal pulses  Pulmonary :  Normal breath sounds, no stridor or respiratory distress   No rales, rhonchi, or wheezing  Abdominal:  Soft, nondistended, bowel sounds present.  No tenderness noted.   Musculoskeletal:  No lower extremity edema noted.  Lymph Nodes:  No cervical lymphadenopathy noted  Skin:  No cyanosis noted  Neurologic:  Alert, appropriate, moves all 4 extremities without obvious deficit.         Assessment & Plan:

## 2014-07-15 NOTE — Patient Instructions (Signed)
Will schedule for a sleep study, and arrange followup once the results are available.  

## 2014-07-15 NOTE — Assessment & Plan Note (Signed)
The patient has definite symptoms at night that are suspicious for clinically significant obstructive sleep apnea. He also has an abnormal alertness during the day with inactivity. At this point, I think the patient needs to have a sleep study for diagnosis, and he is agreeable. Will see him back once the study is complete.

## 2014-07-27 ENCOUNTER — Telehealth: Payer: Self-pay | Admitting: Neurology

## 2014-07-27 NOTE — Telephone Encounter (Signed)
FYI: patient no-showed appointment with Dr. Sanda Klein on yesterday.

## 2014-07-27 NOTE — Telephone Encounter (Signed)
Events noted, if the patient wishes further evaluation, he will need to contact me.

## 2014-07-27 NOTE — Telephone Encounter (Signed)
FYI..Tracey with Dr. Sanda Klein @ [facility] at [place] wanted Dr. Jannifer Franklin to know patient was a no show on yesterday.

## 2014-09-08 ENCOUNTER — Ambulatory Visit (HOSPITAL_BASED_OUTPATIENT_CLINIC_OR_DEPARTMENT_OTHER): Payer: Medicare Other | Attending: Pulmonary Disease | Admitting: Radiology

## 2014-09-08 DIAGNOSIS — G4730 Sleep apnea, unspecified: Secondary | ICD-10-CM | POA: Diagnosis present

## 2014-09-08 DIAGNOSIS — G4733 Obstructive sleep apnea (adult) (pediatric): Secondary | ICD-10-CM | POA: Insufficient documentation

## 2014-09-08 DIAGNOSIS — G4710 Hypersomnia, unspecified: Secondary | ICD-10-CM

## 2014-09-08 DIAGNOSIS — R0683 Snoring: Secondary | ICD-10-CM

## 2014-09-08 DIAGNOSIS — R0689 Other abnormalities of breathing: Secondary | ICD-10-CM

## 2014-09-15 DIAGNOSIS — G4710 Hypersomnia, unspecified: Secondary | ICD-10-CM

## 2014-09-15 DIAGNOSIS — G4730 Sleep apnea, unspecified: Secondary | ICD-10-CM

## 2014-09-15 NOTE — Sleep Study (Signed)
   NAME: Nathaniel [name] DATE OF BIRTH:  06/25/1961 MEDICAL RECORD NUMBER [id number]  LOCATION: [facility]  PHYSICIAN: Kathee Delton  DATE OF STUDY: 09/08/2014  SLEEP STUDY TYPE: Nocturnal Polysomnogram               REFERRING PHYSICIAN: Clance, Armando Reichert, MD  INDICATION FOR STUDY: Hypersomnia with sleep apnea  EPWORTH SLEEPINESS SCORE:  11 HEIGHT:    WEIGHT:      There is no weight on file to calculate BMI.  NECK SIZE: 14 in.  MEDICATIONS: Reviewed in the sleep record  SLEEP ARCHITECTURE: The patient had a total sleep time of 293 minutes, with no slow-wave sleep and only 37 minutes of REM. Sleep onset latency was normal, but REM onset was prolonged.  Sleep efficiency was moderately reduced at 72%.  RESPIRATORY DATA: The patient was found to have 3 apneas as well as 22 obstructive hypopneas, giving him an AHI of 5.1 events per hour. The events occurred primarily in the supine position, and there was very loud snoring noted throughout.  OXYGEN DATA: There was oxygen desaturation as low as 83% with the patient's obstructive events  CARDIAC DATA: No clinically significant arrhythmias were noted  MOVEMENT/PARASOMNIA: No leg jerks or other abnormal behaviors were seen.  IMPRESSION/ RECOMMENDATION:    1) minimal obstructive sleep apnea/hypopnea syndrome, with an AHI of 5.1 events per hour and transient oxygen desaturation as low as 83%. Treatment for this degree of sleep apnea can include a trial of weight loss alone if applicable, upper airway surgery, dental appliance, and also CPAP. Clinical correlation is suggested.     Kathee Delton Diplomate, American Board of Sleep Medicine  ELECTRONICALLY SIGNED ON:  09/15/2014, 12:51 PM [place] PH: [phone]   FX: [phone] [place]

## 2014-09-15 NOTE — Progress Notes (Signed)
Called pt. schedulde to come in 09/30/14

## 2014-09-15 NOTE — Progress Notes (Signed)
Needs ov to review sleep study 

## 2014-09-24 ENCOUNTER — Encounter: Payer: Self-pay | Admitting: Gastroenterology

## 2014-09-30 ENCOUNTER — Encounter: Payer: Self-pay | Admitting: Pulmonary Disease

## 2014-09-30 ENCOUNTER — Ambulatory Visit (INDEPENDENT_AMBULATORY_CARE_PROVIDER_SITE_OTHER): Payer: Medicaid Other | Admitting: Pulmonary Disease

## 2014-09-30 VITALS — BP 122/78 | HR 59 | Temp 97.7°F | Ht 71.0 in | Wt 172.2 lb

## 2014-09-30 DIAGNOSIS — G4733 Obstructive sleep apnea (adult) (pediatric): Secondary | ICD-10-CM

## 2014-09-30 NOTE — Assessment & Plan Note (Signed)
The patient has very mild obstructive sleep apnea by his recent sleep study, but he is very symptomatic and feels that it impacts he and his wife's quality of life. I have outlined various treatment options including a trial of weight loss alone, upper airway surgery, dental appliance, and also CPAP. After a discussion through an interpreter, the patient would like to try CPAP. I will set the patient up on cpap at a moderate pressure level to allow for desensitization, and will troubleshoot the device over the next 4-6weeks if needed.  The pt is to call me if having issues with tolerance.  Will then optimize the pressure once patient is able to wear cpap on a consistent basis.

## 2014-09-30 NOTE — Patient Instructions (Signed)
Will start on cpap as a trial for the next 2 mos.  Please call if you have questions or tolerance issues. Work on modest weight loss. followup with me again in 43mos.

## 2014-09-30 NOTE — Progress Notes (Signed)
   Subjective:    Patient ID: Nathaniel [name], male    DOB: [birth year], 53 y.o.   MRN: [id number]  HPI The patient comes in today for followup after his recent sleep study. He was found to have very mild OSA, with an AHI of 5.1 events per hour and oxygen desaturation as low as 83%. He also had very loud snoring. I have reviewed the study with him in detail, and answered all of his questions.   Review of Systems  Constitutional: Negative for fever and unexpected weight change.  HENT: Negative for congestion, dental problem, ear pain, nosebleeds, postnasal drip, rhinorrhea, sinus pressure, sneezing, sore throat and trouble swallowing.   Eyes: Negative for redness and itching.  Respiratory: Negative for cough, chest tightness, shortness of breath and wheezing.   Cardiovascular: Negative for palpitations and leg swelling.  Gastrointestinal: Negative for nausea and vomiting.  Genitourinary: Negative for dysuria.  Musculoskeletal: Negative for joint swelling.  Skin: Negative for rash.  Neurological: Negative for headaches.  Hematological: Does not bruise/bleed easily.  Psychiatric/Behavioral: Negative for dysphoric mood. The patient is not nervous/anxious.        Objective:   Physical Exam Well-developed male in no acute distress Nose without purulence or discharge noted Neck without lymphadenopathy or thyromegaly Lower extremities without edema, no cyanosis Alert and oriented, moves all 4 extremities.       Assessment & Plan:

## 2014-10-06 ENCOUNTER — Telehealth: Payer: Self-pay | Admitting: Pulmonary Disease

## 2014-10-06 NOTE — Telephone Encounter (Signed)
Spoke with Estill Bamberg at [employer] she needs the most recent OV notes from [facility] and a 2nd Dx code other than OSA on patient.  Sleep Study only shows an AHI 5.1 and insurance will not pay if there is not a 2nd dx code with OSA because patients AHI is less than 15.   Will forward to [facility] to advise. Thanks.

## 2014-10-07 NOTE — Telephone Encounter (Signed)
His PMH includes a history of stroke.  I do not have to code this.  It is part of medical record.

## 2014-10-07 NOTE — Telephone Encounter (Signed)
Estill Bamberg states this is fine but she does not have a copy of the pt PMH so I have faxed this over to the number given. Nothing further needed. Wheaton Bing, CMA

## 2014-10-12 ENCOUNTER — Telehealth: Payer: Self-pay | Admitting: Gastroenterology

## 2014-10-12 NOTE — Telephone Encounter (Signed)
Left message for pt to call back  °

## 2014-10-13 NOTE — Telephone Encounter (Signed)
The pt has had diarrhea and abd pain for 3 weeks and would like to be seen, he scheduled to see Dr Ardis Hughs in Dec but would like to see Amy next week 10/19/14 for current issues.  He will need an interpreter and that has been noted in [facility]

## 2014-10-13 NOTE — Telephone Encounter (Signed)
Left message on machine to call back  

## 2014-10-18 ENCOUNTER — Encounter: Payer: Self-pay | Admitting: *Deleted

## 2014-10-19 ENCOUNTER — Telehealth: Payer: Self-pay | Admitting: *Deleted

## 2014-10-19 ENCOUNTER — Ambulatory Visit (INDEPENDENT_AMBULATORY_CARE_PROVIDER_SITE_OTHER): Payer: Medicare Other | Admitting: Physician Assistant

## 2014-10-19 ENCOUNTER — Encounter: Payer: Self-pay | Admitting: Physician Assistant

## 2014-10-19 ENCOUNTER — Other Ambulatory Visit (INDEPENDENT_AMBULATORY_CARE_PROVIDER_SITE_OTHER): Payer: Medicare Other

## 2014-10-19 VITALS — BP 120/80 | HR 80 | Ht 71.0 in | Wt 168.2 lb

## 2014-10-19 DIAGNOSIS — R1031 Right lower quadrant pain: Secondary | ICD-10-CM

## 2014-10-19 DIAGNOSIS — R1032 Left lower quadrant pain: Secondary | ICD-10-CM

## 2014-10-19 DIAGNOSIS — G8929 Other chronic pain: Secondary | ICD-10-CM

## 2014-10-19 DIAGNOSIS — R197 Diarrhea, unspecified: Secondary | ICD-10-CM

## 2014-10-19 DIAGNOSIS — K625 Hemorrhage of anus and rectum: Secondary | ICD-10-CM

## 2014-10-19 DIAGNOSIS — K2270 Barrett's esophagus without dysplasia: Secondary | ICD-10-CM | POA: Insufficient documentation

## 2014-10-19 DIAGNOSIS — Z800 Family history of malignant neoplasm of digestive organs: Secondary | ICD-10-CM

## 2014-10-19 LAB — CBC WITH DIFFERENTIAL/PLATELET
BASOS ABS: 0 10*3/uL (ref 0.0–0.1)
BASOS PCT: 0.6 % (ref 0.0–3.0)
Eosinophils Absolute: 0.1 10*3/uL (ref 0.0–0.7)
Eosinophils Relative: 1.6 % (ref 0.0–5.0)
HCT: 43.4 % (ref 39.0–52.0)
Hemoglobin: 14.4 g/dL (ref 13.0–17.0)
LYMPHS PCT: 31.3 % (ref 12.0–46.0)
Lymphs Abs: 2.2 10*3/uL (ref 0.7–4.0)
MCHC: 33.2 g/dL (ref 30.0–36.0)
MCV: 91.1 fl (ref 78.0–100.0)
MONO ABS: 0.5 10*3/uL (ref 0.1–1.0)
Monocytes Relative: 7 % (ref 3.0–12.0)
NEUTROS PCT: 59.5 % (ref 43.0–77.0)
Neutro Abs: 4.2 10*3/uL (ref 1.4–7.7)
PLATELETS: 234 10*3/uL (ref 150.0–400.0)
RBC: 4.76 Mil/uL (ref 4.22–5.81)
RDW: 12.8 % (ref 11.5–15.5)
WBC: 7.1 10*3/uL (ref 4.0–10.5)

## 2014-10-19 LAB — SEDIMENTATION RATE: SED RATE: 10 mm/h (ref 0–22)
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
## 2014-10-19 NOTE — Telephone Encounter (Signed)
I called the patient's friend [name] and left a message advising him that the patient has a procedure scheduled at [facility] for 11-08-2014.

## 2014-10-19 NOTE — Patient Instructions (Signed)
Please go to the basement level to have your labs drawn and stool study, You have been scheduled for a colonoscopy. Please follow written instructions given to you at your visit today.  Please pick up your prep kit at the pharmacy within the next 1-3 days. If you use inhalers (even only as needed), please bring them with you on the day of your procedure.

## 2014-10-20 ENCOUNTER — Telehealth: Payer: Self-pay | Admitting: *Deleted

## 2014-10-20 ENCOUNTER — Other Ambulatory Visit: Payer: Medicare Other

## 2014-10-20 ENCOUNTER — Other Ambulatory Visit: Payer: Self-pay | Admitting: *Deleted

## 2014-10-20 DIAGNOSIS — G8929 Other chronic pain: Secondary | ICD-10-CM

## 2014-10-20 DIAGNOSIS — K625 Hemorrhage of anus and rectum: Secondary | ICD-10-CM

## 2014-10-20 DIAGNOSIS — R1031 Right lower quadrant pain: Secondary | ICD-10-CM

## 2014-10-20 DIAGNOSIS — R197 Diarrhea, unspecified: Secondary | ICD-10-CM

## 2014-10-20 DIAGNOSIS — R1032 Left lower quadrant pain: Secondary | ICD-10-CM

## 2014-10-20 DIAGNOSIS — Z800 Family history of malignant neoplasm of digestive organs: Secondary | ICD-10-CM

## 2014-10-20 LAB — BASIC METABOLIC PANEL
BUN: 13 mg/dL (ref 6–23)
CALCIUM: 9.4 mg/dL (ref 8.4–10.5)
CO2: 27 meq/L (ref 19–32)
Chloride: 105 mEq/L (ref 96–112)
Creatinine, Ser: 0.9 mg/dL (ref 0.4–1.5)
GFR: 89.9 mL/min (ref >60.00)
Glucose, Bld: 78 mg/dL (ref 70–99)
Potassium: 4 mEq/L (ref 3.5–5.1)
SODIUM: 139 meq/L (ref 135–145)

## 2014-10-20 LAB — HIGH SENSITIVITY CRP: CRP, High Sensitivity: 2.51 mg/L (ref 0.000–5.000)

## 2014-10-20 NOTE — Progress Notes (Signed)
i agree with the above note, plan 

## 2014-10-20 NOTE — Telephone Encounter (Signed)
I called and advised the patient's son Nathaniel [name] that we originally scheduled his father for a colonoscopy at our [facility] Endoscopy unit for 11-08-2014.  We have since changed the appointment to 11-03-2014 with Dr. [name].  I asked if he would relay this information to his father and he said he would. I told him I will mail his Father new patient instructions, including the new date and time.I will also call his friend [name] with the new date and time.  The patient had given me his name and number to call him.

## 2014-10-20 NOTE — Telephone Encounter (Signed)
I called [place], [phone] and spoke to [place]. She looks at the endo schedule and arranges for people to have interpreters when needed.  I advised her this patient will need a deaf interpreter and she said she is already working on the schedule for 11-03-2014. He will have an interpreter present.

## 2014-10-21 LAB — GASTROINTESTINAL PATHOGEN PANEL PCR
C. DIFFICILE TOX A/B, PCR: POSITIVE — AB
Campylobacter, PCR: NEGATIVE
Cryptosporidium, PCR: NEGATIVE
E COLI 0157, PCR: NEGATIVE
E coli (ETEC) LT/ST PCR: NEGATIVE
E coli (STEC) stx1/stx2, PCR: NEGATIVE
Giardia lamblia, PCR: NEGATIVE
Norovirus, PCR: NEGATIVE
ROTAVIRUS, PCR: NEGATIVE
SALMONELLA, PCR: NEGATIVE
SHIGELLA, PCR: NEGATIVE

## 2014-10-22 ENCOUNTER — Telehealth: Payer: Self-pay | Admitting: Gastroenterology

## 2014-10-24 ENCOUNTER — Telehealth: Payer: Self-pay

## 2014-10-24 ENCOUNTER — Other Ambulatory Visit: Payer: Self-pay

## 2014-10-24 MED ORDER — METRONIDAZOLE 500 MG PO TABS: 500.0000 mg | ORAL_TABLET | Freq: Three times a day (TID) | ORAL | Status: DC

## 2014-10-24 NOTE — Telephone Encounter (Signed)
Metronidazole 500 mg tid for 14 days Follow up with Dr. Ardis Hughs or Amy Trellis Paganini

## 2014-10-24 NOTE — Telephone Encounter (Signed)
Spoke with Heron Sabins (son). He will confirm the results with the patient. ATB's to pharmacy. Patient has an appointment scheduled for 4 week follow up.

## 2014-10-24 NOTE — Telephone Encounter (Signed)
Pt had labs last week and is positive for c diff. Lab result went to Dr. Collene Mares and she states she has not been able to reach the pt. Pt is a Corporate investment banker pt. Please advise what you would like to pt treated with as doc of the day.

## 2014-10-25 ENCOUNTER — Telehealth: Payer: Self-pay | Admitting: *Deleted

## 2014-10-25 ENCOUNTER — Encounter (HOSPITAL_COMMUNITY): Payer: Self-pay | Admitting: *Deleted

## 2014-10-25 ENCOUNTER — Encounter: Payer: Self-pay | Admitting: Gastroenterology

## 2014-10-25 NOTE — Telephone Encounter (Signed)
Advised son

## 2014-10-25 NOTE — Progress Notes (Signed)
Talked to [place] at [phone] for scheduling patient's sign language interpreter. Given information to her for when procedure is, what time to arrive at admitting and where to come.

## 2014-11-02 NOTE — Anesthesia Preprocedure Evaluation (Addendum)
Anesthesia Evaluation  Patient identified by MRN, date of birth, ID band Patient awake    Reviewed: Allergy & Precautions, H&P , NPO status , Patient's Chart, lab work & pertinent test results  History of Anesthesia Complications Negative for: history of anesthetic complications  Airway Mallampati: III  TM Distance: >3 FB Neck ROM: Full    Dental no notable dental hx. (+) Dental Advisory Given   Pulmonary asthma , sleep apnea and Continuous Positive Airway Pressure Ventilation ,  breath sounds clear to auscultation  Pulmonary exam normal       Cardiovascular Exercise Tolerance: Good negative cardio ROS  Rhythm:Regular Rate:Normal  Hx of PSVT, extensively worked up by cardiology, echo and holter monitor   Neuro/Psych TIAnegative psych ROS   GI/Hepatic negative GI ROS, Neg liver ROS,   Endo/Other  negative endocrine ROS  Renal/GU negative Renal ROS  negative genitourinary   Musculoskeletal  (+) Arthritis -, Osteoarthritis,    Abdominal   Peds negative pediatric ROS (+)  Hematology negative hematology ROS (+)   Anesthesia Other Findings   Reproductive/Obstetrics negative OB ROS                            Anesthesia Physical Anesthesia Plan  ASA: II  Anesthesia Plan: MAC   Post-op Pain Management:    Induction: Intravenous  Airway Management Planned: Nasal Cannula  Additional Equipment:   Intra-op Plan:   Post-operative Plan: Extubation in OR  Informed Consent: I have reviewed the patients History and Physical, chart, labs and discussed the procedure including the risks, benefits and alternatives for the proposed anesthesia with the patient or authorized representative who has indicated his/her understanding and acceptance.   Dental advisory given  Plan Discussed with: CRNA  Anesthesia Plan Comments: (Deaf, interpreter used)       Anesthesia Quick Evaluation

## 2014-11-03 ENCOUNTER — Ambulatory Visit (HOSPITAL_COMMUNITY): Payer: Medicare Other | Admitting: Anesthesiology

## 2014-11-03 ENCOUNTER — Encounter (HOSPITAL_COMMUNITY)
Admission: RE | Disposition: A | Discharge status: Home / Self Care | Payer: Self-pay | Source: Ambulatory Visit | Attending: Gastroenterology

## 2014-11-03 ENCOUNTER — Ambulatory Visit (HOSPITAL_COMMUNITY)
Admission: RE | Admit: 2014-11-03 | Discharge: 2014-11-03 | Disposition: A | Discharge status: Home / Self Care | Payer: Medicare Other | Source: Ambulatory Visit | Attending: Gastroenterology | Admitting: Gastroenterology

## 2014-11-03 ENCOUNTER — Encounter (HOSPITAL_COMMUNITY): Payer: Self-pay | Admitting: Gastroenterology

## 2014-11-03 DIAGNOSIS — Z800 Family history of malignant neoplasm of digestive organs: Secondary | ICD-10-CM | POA: Insufficient documentation

## 2014-11-03 DIAGNOSIS — Z888 Allergy status to other drugs, medicaments and biological substances status: Secondary | ICD-10-CM | POA: Insufficient documentation

## 2014-11-03 DIAGNOSIS — H9190 Unspecified hearing loss, unspecified ear: Secondary | ICD-10-CM | POA: Diagnosis not present

## 2014-11-03 DIAGNOSIS — G8929 Other chronic pain: Secondary | ICD-10-CM

## 2014-11-03 DIAGNOSIS — J45909 Unspecified asthma, uncomplicated: Secondary | ICD-10-CM | POA: Diagnosis not present

## 2014-11-03 DIAGNOSIS — R002 Palpitations: Secondary | ICD-10-CM | POA: Insufficient documentation

## 2014-11-03 DIAGNOSIS — Z91013 Allergy to seafood: Secondary | ICD-10-CM | POA: Diagnosis not present

## 2014-11-03 DIAGNOSIS — D125 Benign neoplasm of sigmoid colon: Secondary | ICD-10-CM

## 2014-11-03 DIAGNOSIS — R1032 Left lower quadrant pain: Secondary | ICD-10-CM

## 2014-11-03 DIAGNOSIS — I471 Supraventricular tachycardia: Secondary | ICD-10-CM | POA: Insufficient documentation

## 2014-11-03 DIAGNOSIS — I493 Ventricular premature depolarization: Secondary | ICD-10-CM | POA: Diagnosis not present

## 2014-11-03 DIAGNOSIS — Z1211 Encounter for screening for malignant neoplasm of colon: Secondary | ICD-10-CM

## 2014-11-03 DIAGNOSIS — K635 Polyp of colon: Secondary | ICD-10-CM | POA: Diagnosis not present

## 2014-11-03 DIAGNOSIS — K2270 Barrett's esophagus without dysplasia: Secondary | ICD-10-CM | POA: Diagnosis not present

## 2014-11-03 DIAGNOSIS — R197 Diarrhea, unspecified: Secondary | ICD-10-CM

## 2014-11-03 DIAGNOSIS — K625 Hemorrhage of anus and rectum: Secondary | ICD-10-CM

## 2014-11-03 DIAGNOSIS — Z8673 Personal history of transient ischemic attack (TIA), and cerebral infarction without residual deficits: Secondary | ICD-10-CM | POA: Diagnosis not present

## 2014-11-03 DIAGNOSIS — G4733 Obstructive sleep apnea (adult) (pediatric): Secondary | ICD-10-CM | POA: Insufficient documentation

## 2014-11-03 DIAGNOSIS — G4700 Insomnia, unspecified: Secondary | ICD-10-CM | POA: Diagnosis not present

## 2014-11-03 DIAGNOSIS — E785 Hyperlipidemia, unspecified: Secondary | ICD-10-CM | POA: Insufficient documentation

## 2014-11-03 DIAGNOSIS — K219 Gastro-esophageal reflux disease without esophagitis: Secondary | ICD-10-CM | POA: Diagnosis not present

## 2014-11-03 DIAGNOSIS — R1031 Right lower quadrant pain: Secondary | ICD-10-CM

## 2014-11-03 HISTORY — PX: COLONOSCOPY WITH PROPOFOL: SHX5780

## 2014-11-03 SURGERY — COLONOSCOPY WITH PROPOFOL
Anesthesia: Monitor Anesthesia Care

## 2014-11-03 MED ORDER — PROPOFOL 10 MG/ML IV BOLUS
INTRAVENOUS | Status: AC
  Filled 2014-11-03: qty 20

## 2014-11-03 MED ORDER — LACTATED RINGERS IV SOLN
INTRAVENOUS | Status: DC | PRN
  Administered 2014-11-03: 11:00:00 via INTRAVENOUS

## 2014-11-03 MED ORDER — SODIUM CHLORIDE 0.9 % IV SOLN: INTRAVENOUS | Status: DC

## 2014-11-03 MED ORDER — PROPOFOL 10 MG/ML IV BOLUS
INTRAVENOUS | Status: DC | PRN
Start: 2014-11-03 — End: 2014-11-03
  Administered 2014-11-03 (×5): 50 mg via INTRAVENOUS

## 2014-11-03 SURGICAL SUPPLY — 22 items

## 2014-11-03 NOTE — Op Note (Signed)
[facility] [place], [address]   COLONOSCOPY PROCEDURE REPORT  PATIENT: Tadarrius, Burch  MR#: [id number] BIRTHDATE: Dec 15, 1961 , 61  yrs. old GENDER: male ENDOSCOPIST: Milus Banister, MD PROCEDURE DATE:  11/03/2014 PROCEDURE:   Colonoscopy with snare polypectomy First Screening Colonoscopy - Avg.  risk and is 50 yrs.  old or older - No.  Prior Negative Screening - Now for repeat screening. N/A  History of Adenoma - Now for follow-up colonoscopy & has been > or = to 3 yrs.  N/A  Polyps Removed Today? Yes. ASA CLASS:   Class II INDICATIONS:recent diarrhea that was almost certainly from C. difficile.  here for colon cancer screening colonoscopy now.. MEDICATIONS: Monitored anesthesia care  DESCRIPTION OF PROCEDURE:   After the risks benefits and alternatives of the procedure were thoroughly explained, informed consent was obtained.  The digital rectal exam revealed no abnormalities of the rectum.   The EC-[id number] ([id number])  endoscope was introduced through the anus and advanced to the cecum, which was identified by both the appendix and ileocecal valve. No adverse events experienced.   The quality of the prep was good.  The instrument was then slowly withdrawn as the colon was fully examined.   COLON FINDINGS: Two sessile polyps ranging between 3-33mm in size were found.  Polypectomies were performed with a cold snare.  The resection was complete, the polyp tissue was completely retrieved and sent to histology.   The examination was otherwise normal. Retroflexed views revealed no abnormalities. The time to cecum=3 minutes 00 seconds.  Withdrawal time=8 minutes 00 seconds.  The scope was withdrawn and the procedure completed. COMPLICATIONS: There were no immediate complications.  ENDOSCOPIC IMPRESSION: 1.   Two sessile polyps ranging between 3-31mm in size were found; polypectomies were performed with a cold snare 2.   The examination was  otherwise normal  RECOMMENDATIONS: 1. If the polyp(s) removed today are proven to be adenomatous (pre-cancerous) polyps, you will need a repeat colonoscopy in 5 years.  Otherwise you should continue to follow colorectal cancer screening guidelines for "routine risk" patients with colonoscopy in 10 years.  You will receive a letter within 1-2 weeks with the results of your biopsy as well as final recommendations.  Please call my office if you have not received a letter after 3 weeks. 2. Please complete your antibiotics.  eSigned:  Milus Banister, MD 11/03/2014 12:04 PM
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
## 2014-11-03 NOTE — Transfer of Care (Signed)
Immediate Anesthesia Transfer of Care Note  Patient: Nathaniel [name]  Procedure(s) Performed: Procedure(s): COLONOSCOPY WITH PROPOFOL (N/A)  Patient Location: PACU  Anesthesia Type:MAC  Level of Consciousness: awake, alert , sedated and patient cooperative  Airway & Oxygen Therapy: Patient Spontanous Breathing and Patient connected to face mask oxygen  Post-op Assessment: Report given to PACU RN and Post -op Vital signs reviewed and stable  Post vital signs: Reviewed and stable  Complications: No apparent anesthesia complications

## 2014-11-03 NOTE — Discharge Instructions (Signed)

## 2014-11-03 NOTE — Anesthesia Postprocedure Evaluation (Signed)
  Anesthesia Post-op Note  Patient: Nathaniel [name]  Procedure(s) Performed: Procedure(s) (LRB): COLONOSCOPY WITH PROPOFOL (N/A)  Patient Location: PACU  Anesthesia Type: MAC  Level of Consciousness: awake and alert   Airway and Oxygen Therapy: Patient Spontanous Breathing  Post-op Pain: mild  Post-op Assessment: Post-op Vital signs reviewed, Patient's Cardiovascular Status Stable, Respiratory Function Stable, Patent Airway and No signs of Nausea or vomiting  Last Vitals:  Filed Vitals:   11/03/14 1212  BP: 112/58  Pulse: 74  Temp:   Resp: 12    Post-op Vital Signs: stable   Complications: No apparent anesthesia complications

## 2014-11-03 NOTE — Interval H&P Note (Signed)
History and Physical Interval Note:  11/03/2014 10:48 AM  Nathaniel [name]  has presented today for surgery, with the diagnosis of Family history of colon cancer Abdominal pain Rectal bleeding Diarrhea  The various methods of treatment have been discussed with the patient and family. After consideration of risks, benefits and other options for treatment, the patient has consented to  Procedure(s): COLONOSCOPY WITH PROPOFOL (N/A) as a surgical intervention .  The patient's history has been reviewed, patient examined, no change in status, stable for surgery.  I have reviewed the patient's chart and labs.  Questions were answered to the patient's satisfaction.     Milus Banister

## 2014-11-04 ENCOUNTER — Emergency Department (HOSPITAL_COMMUNITY): Payer: Medicare Other

## 2014-11-04 ENCOUNTER — Emergency Department (HOSPITAL_COMMUNITY)
Admission: EM | Admit: 2014-11-04 | Discharge: 2014-11-04 | Disposition: A | Discharge status: Home / Self Care | Payer: Medicare Other | Attending: Emergency Medicine | Admitting: Emergency Medicine

## 2014-11-04 ENCOUNTER — Encounter (HOSPITAL_COMMUNITY): Payer: Self-pay | Admitting: Gastroenterology

## 2014-11-04 DIAGNOSIS — J45909 Unspecified asthma, uncomplicated: Secondary | ICD-10-CM | POA: Diagnosis not present

## 2014-11-04 DIAGNOSIS — Z79899 Other long term (current) drug therapy: Secondary | ICD-10-CM | POA: Diagnosis not present

## 2014-11-04 DIAGNOSIS — Y92099 Unspecified place in other non-institutional residence as the place of occurrence of the external cause: Secondary | ICD-10-CM | POA: Diagnosis not present

## 2014-11-04 DIAGNOSIS — Z7982 Long term (current) use of aspirin: Secondary | ICD-10-CM | POA: Insufficient documentation

## 2014-11-04 DIAGNOSIS — Z8673 Personal history of transient ischemic attack (TIA), and cerebral infarction without residual deficits: Secondary | ICD-10-CM | POA: Diagnosis not present

## 2014-11-04 DIAGNOSIS — Z792 Long term (current) use of antibiotics: Secondary | ICD-10-CM | POA: Diagnosis not present

## 2014-11-04 DIAGNOSIS — S42101A Fracture of unspecified part of scapula, right shoulder, initial encounter for closed fracture: Secondary | ICD-10-CM | POA: Diagnosis not present

## 2014-11-04 DIAGNOSIS — R0602 Shortness of breath: Secondary | ICD-10-CM | POA: Diagnosis not present

## 2014-11-04 DIAGNOSIS — W19XXXA Unspecified fall, initial encounter: Secondary | ICD-10-CM

## 2014-11-04 DIAGNOSIS — W1789XA Other fall from one level to another, initial encounter: Secondary | ICD-10-CM

## 2014-11-04 DIAGNOSIS — Z7951 Long term (current) use of inhaled steroids: Secondary | ICD-10-CM | POA: Diagnosis not present

## 2014-11-04 DIAGNOSIS — Y9389 Activity, other specified: Secondary | ICD-10-CM | POA: Insufficient documentation

## 2014-11-04 LAB — CBG MONITORING, ED: Glucose-Capillary: 107 mg/dL — ABNORMAL HIGH (ref 70–99)

## 2014-11-04 LAB — I-STAT CHEM 8, ED
BUN: 13 mg/dL (ref 6–23)
CHLORIDE: 105 meq/L (ref 96–112)
CREATININE: 0.8 mg/dL (ref 0.50–1.35)
Calcium, Ion: 1.17 mmol/L (ref 1.12–1.23)
Glucose, Bld: 103 mg/dL — ABNORMAL HIGH (ref 70–99)
HCT: 44 % (ref 39.0–52.0)
Hemoglobin: 15 g/dL (ref 13.0–17.0)
Potassium: 3.5 mEq/L — ABNORMAL LOW (ref 3.7–5.3)
Sodium: 140 mEq/L (ref 137–147)
TCO2: 22 mmol/L (ref 0–100)

## 2014-11-04 LAB — CBC WITH DIFFERENTIAL/PLATELET
Basophils Absolute: 0 10*3/uL (ref 0.0–0.1)
Basophils Relative: 0 % (ref 0–1)
Eosinophils Absolute: 0 10*3/uL (ref 0.0–0.7)
Eosinophils Relative: 0 % (ref 0–5)
HCT: 41.6 % (ref 39.0–52.0)
Hemoglobin: 14 g/dL (ref 13.0–17.0)
Lymphocytes Relative: 5 % — ABNORMAL LOW (ref 12–46)
Lymphs Abs: 0.6 10*3/uL — ABNORMAL LOW (ref 0.7–4.0)
MCH: 30.6 pg (ref 26.0–34.0)
MCHC: 33.7 g/dL (ref 30.0–36.0)
MCV: 91 fL (ref 78.0–100.0)
Monocytes Absolute: 0.9 10*3/uL (ref 0.1–1.0)
Monocytes Relative: 6 % (ref 3–12)
Neutro Abs: 11.7 10*3/uL — ABNORMAL HIGH (ref 1.7–7.7)
Neutrophils Relative %: 89 % — ABNORMAL HIGH (ref 43–77)
Platelets: 211 10*3/uL (ref 150–400)
RBC: 4.57 MIL/uL (ref 4.22–5.81)
RDW: 12.5 % (ref 11.5–15.5)
WBC: 13.2 10*3/uL — ABNORMAL HIGH (ref 4.0–10.5)

## 2014-11-04 MED ORDER — HYDROMORPHONE HCL 1 MG/ML IJ SOLN
1.0000 mg | Freq: Once | INTRAMUSCULAR | Status: AC
Start: 2014-11-04 — End: 2014-11-04
  Administered 2014-11-04: 1 mg via INTRAVENOUS
  Filled 2014-11-04: qty 1

## 2014-11-04 MED ORDER — HYDROCODONE-ACETAMINOPHEN 5-325 MG PO TABS: 1.0000 | ORAL_TABLET | ORAL | Status: DC | PRN

## 2014-11-04 MED ORDER — SODIUM CHLORIDE 0.9 % IV BOLUS (SEPSIS)
500.0000 mL | Freq: Once | INTRAVENOUS | Status: AC
  Administered 2014-11-04: 500 mL via INTRAVENOUS

## 2014-11-04 MED ORDER — OXYCODONE-ACETAMINOPHEN 5-325 MG PO TABS
1.0000 | ORAL_TABLET | Freq: Once | ORAL | Status: DC
Start: 2014-11-04 — End: 2014-11-05
  Filled 2014-11-04 (×2): qty 1

## 2014-11-04 MED ORDER — IOHEXOL 300 MG/ML  SOLN
100.0000 mL | Freq: Once | INTRAMUSCULAR | Status: AC | PRN
  Administered 2014-11-04: 100 mL via INTRAVENOUS

## 2014-11-04 MED ORDER — HYDROCODONE-ACETAMINOPHEN 5-325 MG PO TABS
2.0000 | ORAL_TABLET | Freq: Once | ORAL | Status: AC
  Administered 2014-11-04: 2 via ORAL
  Filled 2014-11-04: qty 2

## 2014-11-04 MED ORDER — ONDANSETRON HCL 4 MG/2ML IJ SOLN
4.0000 mg | Freq: Once | INTRAMUSCULAR | Status: AC
  Administered 2014-11-04: 4 mg via INTRAVENOUS
  Filled 2014-11-04: qty 2

## 2014-11-04 NOTE — ED Provider Notes (Signed)
CSN: [id number]     Arrival date & time 11/04/14  1541 History   First MD Initiated Contact with Patient 11/04/14 1547     Chief Complaint  Patient presents with  . Fall     (Consider location/radiation/quality/duration/timing/severity/associated sxs/prior Treatment) HPI Comments: Patient presents to the ER for evaluation after a fall. Patient reports that he was working on a ladder approximately 8 feet up. Patient reports that the ground was wet and the bottom of the ladder slipped out. He fellto the ground, landing primarily on the right shoulder. Patient has been having severe pain in the shoulder since the fall. He denies hitting his head. There was no loss of consciousness. Patient denies headache, neck pain, back pain. Patient drove himself back from [place] where he was working to his home. He reports that he had difficulty getting into his apartment. He started to feel anxious, weak, short of breath. He lied down on the ground where he was found by EMS. Patient brought to the ER complaining of continued right shoulder pain, shortness of breath has resolved.  Patient is a 53 y.o. male presenting with fall. The history is provided by the patient and the EMS personnel. The history is limited by a language barrier (patient is deaf). A language interpreter was used.  Fall Associated symptoms include shortness of breath.    Past Medical History  Diagnosis Date  . Deaf     Call [phone] for patient's sign language interpreter.   . Barrett's esophagus 10-25-14 pt denies    states "minor"  . Asthma     prn inhaler  . TIA (transient ischemic attack) 10-25-14 pt denies    no current deficits  . Seasonal allergies   . High cholesterol   . Heartburn     occasional, related to certain foods  . Arthritis     right knee  . 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 Intermittent palpitations 10-25-14 patient denies    has had a pretty significant workup but no true arrhythmias being found as  of 2013; Holter monitor with occasional bigeminy and trigeminy with quadrigeminy. Also noted supraventricular ectopy, but was intolerant of beta blockers at the time.   Past Surgical History  Procedure Laterality Date  . Bunionectomy Left 2012    right done also  . Knee arthroscopy Left 06/02/2012  . Knee arthroscopy Right fall 2013    x 2  . Inguinal hernia repair    . Cardiac catheterization  06/24/2011    wwidely patent normal coronary arteries with normal ejection fraction  . Knee arthroscopy Left 03/03/2013    Procedure: ARTHROSCOPY KNEE WITH CHONDROPLASTY PATELLA  AND LATERAL TIBIAL PLATEAU;  Surgeon: Kerin Salen, MD;  Location: [facility];  Service: Orthopedics;  Laterality: Left;  . Foot implant removal Right 07/15/2013    @ [facility]  . Nm myoview ltd  2012    small fixed basal inferior artifact. Normal EF.  . Carotid artery dopplers  2012    tortuous but no stenoses.. No subclavian disease  . Upper and lower extremity arterial dopplers  2012    normal arterial flow bilateral upper extremities including subclavian is a carotid;;R. ABI 1.2, L. ABI 1.28. Normal flow velocities. Likely calcified vessels.  . Abdominal aortic ultrasound  2012    normal abdominal aorta  . Transthoracic echocardiogram  June2015    nnormal EF: 55-60%. No wall motion abnormalities. Gr 1 DD.  Mild MR. Otherwise normal  . Colonoscopy with propofol  N/A 11/03/2014    Procedure: COLONOSCOPY WITH PROPOFOL;  Surgeon: Milus Banister, MD;  Location: [facility];  Service: Endoscopy;  Laterality: N/A;   Family History  Problem Relation Age of Onset  . Ovarian cancer Mother   . Coronary artery disease Father    History  Substance Use Topics  . Smoking status: Never Smoker   . Smokeless tobacco: Never Used  . Alcohol Use: No    Review of Systems  Respiratory: Positive for shortness of breath.   Musculoskeletal: Positive for arthralgias.  All other systems reviewed and are  negative.     Allergies  Simvastatin and Fish allergy  Home Medications   Prior to Admission medications   Medication Sig Start Date End Date Taking? Authorizing Provider  albuterol (PROVENTIL) (2.5 MG/3ML) 0.083% nebulizer solution Take 3 mLs (2.5 mg total) by nebulization every 6 (six) hours as needed for wheezing. 08/24/13  Yes Johnna Acosta, MD  aspirin EC 325 MG tablet Take 325 mg by mouth daily.   Yes Historical Provider, MD  fluticasone (FLONASE) 50 MCG/ACT nasal spray Place 1 spray into both nostrils daily. 02/04/14  Yes Historical Provider, MD  Fluticasone-Salmeterol (ADVAIR) 250-50 MCG/DOSE AEPB Inhale 1 puff into the lungs daily.    Yes Historical Provider, MD  metroNIDAZOLE (FLAGYL) 500 MG tablet Take 1 tablet (500 mg total) by mouth 3 (three) times daily. 10/24/14  Yes Ladene Artist, MD  HYDROcodone-acetaminophen (NORCO/VICODIN) 5-325 MG per tablet Take 1-2 tablets by mouth every 4 (four) hours as needed for moderate pain. 11/04/14   Orpah Greek, MD  mometasone-formoterol (DULERA) 100-5 MCG/ACT AERO Inhale 1 puff into the lungs daily.    Historical Provider, MD   BP 130/88 mmHg  Pulse 99  Temp(Src) 98.7 F (37.1 C) (Oral)  Resp 20  SpO2 94% Physical Exam  Constitutional: He is oriented to person, place, and time. He appears well-developed and well-nourished. No distress.  HENT:  Head: Normocephalic and atraumatic.  Right Ear: Hearing normal.  Left Ear: Hearing normal.  Nose: Nose normal.  Mouth/Throat: Oropharynx is clear and moist and mucous membranes are normal.  Eyes: Conjunctivae and EOM are normal. Pupils are equal, round, and reactive to light.  Neck: Normal range of motion. Neck supple. No spinous process tenderness and no muscular tenderness present.  Cardiovascular: Regular rhythm, S1 normal and S2 normal.  Exam reveals no gallop and no friction rub.   No murmur heard. Pulmonary/Chest: Effort normal and breath sounds normal. No respiratory  distress. He exhibits no tenderness.  Abdominal: Soft. Normal appearance and bowel sounds are normal. There is no hepatosplenomegaly. There is no tenderness. There is no rebound, no guarding, no tenderness at McBurney's point and negative Murphy's sign. No hernia.  Musculoskeletal:       Right shoulder: He exhibits decreased range of motion and tenderness. He exhibits no deformity.       Right elbow: Normal.      Right wrist: Normal.       Right hip: Normal.       Left hip: Normal.       Cervical back: Normal.       Thoracic back: Normal.       Lumbar back: Normal.  Neurological: He is alert and oriented to person, place, and time. He has normal strength. No cranial nerve deficit or sensory deficit. Coordination normal. GCS eye subscore is 4. GCS verbal subscore is 5. GCS motor subscore is 6.  Skin: Skin is warm, dry  and intact. No rash noted. No cyanosis.  Psychiatric: He has a normal mood and affect. His speech is normal and behavior is normal. Thought content normal.    ED Course  Procedures (including critical care time) Labs Review Labs Reviewed  CBC WITH DIFFERENTIAL - Abnormal; Notable for the following:    WBC 13.2 (*)    Neutrophils Relative % 89 (*)    Neutro Abs 11.7 (*)    Lymphocytes Relative 5 (*)    Lymphs Abs 0.6 (*)    All other components within normal limits  I-STAT CHEM 8, ED - Abnormal; Notable for the following:    Potassium 3.5 (*)    Glucose, Bld 103 (*)    All other components within normal limits  CBG MONITORING, ED - Abnormal; Notable for the following:    Glucose-Capillary 107 (*)    All other components within normal limits    Imaging Review Dg Chest 2 View  11/04/2014   CLINICAL DATA:  Fall from ladder/embankment, right shoulder pain  EXAM: CHEST  2 VIEW  COMPARISON:  06/10/2014  FINDINGS: Chronic interstitial markings. Mild bibasilar opacities, likely atelectasis. No pleural effusion or pneumothorax.  Cardiomegaly.  Comminuted right scapular  fracture.  IMPRESSION: Comminuted right scapular fracture.  No pneumothorax.   Electronically Signed   By: Julian Hy M.D.   On: 11/04/2014 17:23   Dg Shoulder Right  11/04/2014   CLINICAL DATA:  Fall from a ladder/embankment, right shoulder pain  EXAM: RIGHT SHOULDER - 2+ VIEW  COMPARISON:  None.  FINDINGS: Comminuted right scapular fracture.  No evidence of right shoulder fracture.  Visualized right lung is clear.  No pneumothorax is seen.  IMPRESSION: Comminuted right scapular fracture.   Electronically Signed   By: Julian Hy M.D.   On: 11/04/2014 17:22   Ct Head Wo Contrast  11/04/2014   CLINICAL DATA:  Fall from ladder 8 feet on to the right shoulder, shoulder pain  EXAM: CT HEAD WITHOUT CONTRAST  CT CERVICAL SPINE WITHOUT CONTRAST  TECHNIQUE: Multidetector CT imaging of the head and cervical spine was performed following the standard protocol without intravenous contrast. Multiplanar CT image reconstructions of the cervical spine were also generated.  COMPARISON:  12/20/2012 head CT  FINDINGS: CT HEAD FINDINGS  Moderate ventriculomegaly is reidentified. No midline shift. No acute hemorrhage, infarct, or mass lesion is identified. Globes are unremarkable. No skull fracture.  CT CERVICAL SPINE FINDINGS  C1 through the cervicothoracic junction is visualized in its entirety. No precervical soft tissue widening. Normal alignment. No fracture or dislocation.  IMPRESSION: Stable ventriculomegaly without acute intracranial finding.  No acute cervical spine fracture or dislocation.   Electronically Signed   By: Conchita Paris M.D.   On: 11/04/2014 19:00   Ct Chest W Contrast  11/04/2014   CLINICAL DATA:  53 year old male status post fall of 8 feet from ladder.  EXAM: CT CHEST, ABDOMEN AND PELVIS WITHOUT CONTRAST  TECHNIQUE: Multidetector CT imaging of the chest, abdomen and pelvis was performed following the standard protocol without IV contrast.  COMPARISON:  None.  FINDINGS: CT CHEST FINDINGS   Mediastinum: Unremarkable CT appearance of the thyroid gland. No suspicious mediastinal or hilar adenopathy. No soft tissue mediastinal mass. The thoracic esophagus is unremarkable.  Heart/Vascular: Mild cardiomegaly. No evidence of acute aortic injury. Normal caliber pulmonary arteries.  Lungs/Pleura: Dependent atelectasis throughout both lower lobes. Inspiratory volumes are very low.  Bones/Soft Tissues: Comminuted and displaced fracture through the body of the scapula below the  level of the scapular spine. Mild associated edema and hematoma in the surrounding musculature. No evidence of additional acute fracture.  CT ABDOMEN AND PELVIS FINDINGS  Abdomen: Unremarkable CT appearance of the stomach, duodenum, spleen, adrenal glands and pancreas. Normal hepatic contours and morphology. No discrete hepatic lesion. Gallbladder is unremarkable. No intra or extrahepatic biliary ductal dilatation. Unremarkable appearance of the bilateral kidneys. No focal solid lesion, hydronephrosis or nephrolithiasis. 4 cm left renal cyst exophytic from the upper pole.  Colonic diverticular disease without CT evidence of active inflammation. No evidence of obstruction or focal bowel wall thickening. Normal appendix in the right lower quadrant. The terminal ileum is unremarkable. No free fluid or suspicious adenopathy. No free air.  Pelvis: Unremarkable bladder, prostate gland and seminal vesicles. No free fluid or suspicious adenopathy.  Bones/Soft Tissues: No acute fracture or aggressive appearing lytic or blastic osseous lesion. Focal degenerative disc disease at L5-S1.  Vascular: No significant atherosclerotic vascular disease, aneurysmal dilatation or acute abnormality.  IMPRESSION: CT CHEST  1. Isolated comminuted and displaced fracture through the infraspinous body of the right scapula. There is surrounding edema and hematoma in the adjacent musculature. 2. No evidence of additional acute injury to the chest. 3. Low inspiratory  volumes with bilateral lower lobe atelectasis likely secondary to splinting. 4. Mild cardiomegaly. CT ABD/PELVIS  1. No evidence of acute injury in the abdomen or pelvis. 2. Mild colonic sigmoid diverticulosis. 3. Left renal cyst. 4. Focal degenerative disc disease at L5-S1.   Electronically Signed   By: Jacqulynn Cadet M.D.   On: 11/04/2014 19:17   Ct Cervical Spine Wo Contrast  11/04/2014   CLINICAL DATA:  Fall from ladder 8 feet on to the right shoulder, shoulder pain  EXAM: CT HEAD WITHOUT CONTRAST  CT CERVICAL SPINE WITHOUT CONTRAST  TECHNIQUE: Multidetector CT imaging of the head and cervical spine was performed following the standard protocol without intravenous contrast. Multiplanar CT image reconstructions of the cervical spine were also generated.  COMPARISON:  12/20/2012 head CT  FINDINGS: CT HEAD FINDINGS  Moderate ventriculomegaly is reidentified. No midline shift. No acute hemorrhage, infarct, or mass lesion is identified. Globes are unremarkable. No skull fracture.  CT CERVICAL SPINE FINDINGS  C1 through the cervicothoracic junction is visualized in its entirety. No precervical soft tissue widening. Normal alignment. No fracture or dislocation.  IMPRESSION: Stable ventriculomegaly without acute intracranial finding.  No acute cervical spine fracture or dislocation.   Electronically Signed   By: Conchita Paris M.D.   On: 11/04/2014 19:00   Ct Abdomen Pelvis W Contrast  11/04/2014   CLINICAL DATA:  53 year old male status post fall of 8 feet from ladder.  EXAM: CT CHEST, ABDOMEN AND PELVIS WITHOUT CONTRAST  TECHNIQUE: Multidetector CT imaging of the chest, abdomen and pelvis was performed following the standard protocol without IV contrast.  COMPARISON:  None.  FINDINGS: CT CHEST FINDINGS  Mediastinum: Unremarkable CT appearance of the thyroid gland. No suspicious mediastinal or hilar adenopathy. No soft tissue mediastinal mass. The thoracic esophagus is unremarkable.  Heart/Vascular: Mild  cardiomegaly. No evidence of acute aortic injury. Normal caliber pulmonary arteries.  Lungs/Pleura: Dependent atelectasis throughout both lower lobes. Inspiratory volumes are very low.  Bones/Soft Tissues: Comminuted and displaced fracture through the body of the scapula below the level of the scapular spine. Mild associated edema and hematoma in the surrounding musculature. No evidence of additional acute fracture.  CT ABDOMEN AND PELVIS FINDINGS  Abdomen: Unremarkable CT appearance of the stomach, duodenum, spleen, adrenal  glands and pancreas. Normal hepatic contours and morphology. No discrete hepatic lesion. Gallbladder is unremarkable. No intra or extrahepatic biliary ductal dilatation. Unremarkable appearance of the bilateral kidneys. No focal solid lesion, hydronephrosis or nephrolithiasis. 4 cm left renal cyst exophytic from the upper pole.  Colonic diverticular disease without CT evidence of active inflammation. No evidence of obstruction or focal bowel wall thickening. Normal appendix in the right lower quadrant. The terminal ileum is unremarkable. No free fluid or suspicious adenopathy. No free air.  Pelvis: Unremarkable bladder, prostate gland and seminal vesicles. No free fluid or suspicious adenopathy.  Bones/Soft Tissues: No acute fracture or aggressive appearing lytic or blastic osseous lesion. Focal degenerative disc disease at L5-S1.  Vascular: No significant atherosclerotic vascular disease, aneurysmal dilatation or acute abnormality.  IMPRESSION: CT CHEST  1. Isolated comminuted and displaced fracture through the infraspinous body of the right scapula. There is surrounding edema and hematoma in the adjacent musculature. 2. No evidence of additional acute injury to the chest. 3. Low inspiratory volumes with bilateral lower lobe atelectasis likely secondary to splinting. 4. Mild cardiomegaly. CT ABD/PELVIS  1. No evidence of acute injury in the abdomen or pelvis. 2. Mild colonic sigmoid  diverticulosis. 3. Left renal cyst. 4. Focal degenerative disc disease at L5-S1.   Electronically Signed   By: Jacqulynn Cadet M.D.   On: 11/04/2014 19:17     EKG Interpretation   Date/Time:  Friday November 04 2014 15:43:36 EST Ventricular Rate:  89 PR Interval:  143 QRS Duration: 80 QT Interval:  355 QTC Calculation: 432 R Axis:   24 Text Interpretation:  Sinus rhythm Baseline wander in lead(s) III V1  Otherwise within normal limits Confirmed by Izan Miron  MD, Ingri Diemer  [phone]) on 11/04/2014 3:48:00 PM      MDM   Final diagnoses:  Fall  Injury resulting from fall from height  Scapular fracture, right, closed, initial encounter    Patient presents to the ER for evaluation after a fall. Patient reports a fall from a height of approximately 80 feet. Patient was working on a ladder that slipped out from under him, causing him to fall and landed on concrete. Patient reports severe right shoulder pain since fall. He did not think he hit his head or lost consciousness. There is, however, significant language barrier with him being deaf. Information provided via sign language interpreter.x-ray of the shoulder was obtained and did show comminuted fracture of the scapula, indicating significant force with fall. Patient therefore was sent back to radiology for CT head, cervical spine, chest, abdomen, pelvis to further evaluate for occult injury. No other injuries are identified. Patient will be discharged with sling, analgesia, follow up with orthopedics. Patient does have an orthopedic surgeon that he is seen for his knees, but is not sure who he saw. Patient told that he needs to follow-up with his orthopedic surgeon. In the event that he cannot identify this person, was given follow-up instructions for on-call orthopedics.    Orpah Greek, MD 11/05/14 [phone]

## 2014-11-04 NOTE — ED Notes (Signed)
Pt sts he would only like medications IV, as he has had bad experiences in the past to the medication offered for pain (percocet).  EDP made aware and notified pt that MD unable to provide any other pain medication options at this time.  Pt understands and is awaiting xray.

## 2014-11-04 NOTE — Discharge Instructions (Signed)
Follow-up with the orthopedic surgeon that you have seen in the past for your knees. If you cannot figure out who that was, a follow-up doctor has been provided below.  Scapular Fracture You have a fracture (break in bone) of your scapula. This is your shoulder blade. It is the large flat bone behind your shoulder. This is also the bone that makes up the ball and socket joint of your shoulder. Most of the time surgery is not required for injuries to this bone unless the socket of the shoulder joint is involved. DIAGNOSIS  Because of the severity of force usually required to break this bone, x-rays are often taken of other bones likely to be injured at the same time. X-rays of the hip, knee, and pelvis may be taken. Specialized x-rays (arteriograms) may be needed if there are injuries to large blood vessels associated with this injury. HOME CARE INSTRUCTIONS   Simple fractures of the scapula can be treated with a sling and swathe type of immobilization. This means the involved area is held in place by putting the arm in a sling. A wrap is made around the upper arm with the sling holding the arm next to the chest. This may be removed for bathing as instructed by your caregiver.  Apply ice to the injury for 15-20 minutes 03-04 times per day. Put the ice in a plastic bag. Place a towel between the bag of ice and your skin, splint, or immobilization device.  Do not resume use until instructed by your caregiver. Usually full rehabilitation (exercises to improve the injury site) will begin sometime after the sling and swathe are removed. Then begin use gradually as directed. Do not increase use to the point of pain. If pain develops, decrease use and continue the above measures. Slowly increase activities that do not cause discomfort until you gradually achieve normal use without pain.  Only take over-the-counter or prescription medicines for pain, discomfort, or fever as directed by your caregiver. SEEK  IMMEDIATE MEDICAL CARE IF:   Your pain and swelling increase and is uncontrolled with medications.  You develop new, unexplained symptoms or an increase of the symptoms which brought you to your caregiver.  You develop shortness or breath or cough up blood.  You are unable to move your arm or fingers. You develop warmth and swelling in your affected arm.  You develop an unexplained temperature. Document Released: 12/16/2005 Document Revised: 03/09/2012 Document Reviewed: 11/07/2006 [facility] Patient Information 2015 [place], [place]. This information is not intended to replace advice given to you by your health care provider. Make sure you discuss any questions you have with your health care provider.

## 2014-11-04 NOTE — ED Notes (Signed)
Pt gave this RN permission to provide over-the-phone update to this Son and request that his son come to pick him up.  RN provided information about visit.

## 2014-11-04 NOTE — ED Notes (Signed)
Per EMS: Pt is deaf and story hard to acquire.  Pt's wife was interpreting sign language and then using a phone interpreter to talk to EMS.  Pt sts he was in [place] doing work and fell from a high place (ladder or embankment).  Pt drove himself to [facility] and was found on the ground yelling for help in front of his apartment complex's main office.  Pt is complaining of right shoulder pain and neck pain.  Pt is also very tender to abdomen. VS WDL during transport.

## 2014-11-04 NOTE — ED Notes (Signed)
Interpreter at bedside.

## 2014-11-30 ENCOUNTER — Encounter: Payer: Self-pay | Admitting: Pulmonary Disease

## 2014-11-30 ENCOUNTER — Ambulatory Visit (INDEPENDENT_AMBULATORY_CARE_PROVIDER_SITE_OTHER): Payer: Medicare Other | Admitting: Pulmonary Disease

## 2014-11-30 VITALS — BP 118/76 | HR 76 | Temp 97.7°F | Ht 71.0 in | Wt 170.2 lb

## 2014-11-30 DIAGNOSIS — G4733 Obstructive sleep apnea (adult) (pediatric): Secondary | ICD-10-CM

## 2014-11-30 NOTE — Progress Notes (Signed)
   Subjective:    Patient ID: Nathaniel [name], male    DOB: [birth year], 53 y.o.   MRN: [id number]  HPI The patient comes in today for follow-up of his obstructive sleep apnea. He was started on C Pap as a trial at the last visit, but has tolerated this very poorly. His recent download shows that he only uses the device 9 out of 30 days, and the patient feels very strongly that he will not be able to tolerate this. I have discussed with him the other options such as weight loss and also a dental appliance.   Review of Systems  Constitutional: Negative for fever and unexpected weight change.  HENT: Negative for congestion, dental problem, ear pain, nosebleeds, postnasal drip, rhinorrhea, sinus pressure, sneezing, sore throat and trouble swallowing.   Eyes: Negative for redness and itching.  Respiratory: Negative for cough, chest tightness, shortness of breath and wheezing.   Cardiovascular: Negative for palpitations and leg swelling.  Gastrointestinal: Negative for nausea and vomiting.  Genitourinary: Negative for dysuria.  Musculoskeletal: Negative for joint swelling.  Skin: Negative for rash.  Neurological: Negative for headaches.  Hematological: Does not bruise/bleed easily.  Psychiatric/Behavioral: Negative for dysphoric mood. The patient is not nervous/anxious.        Objective:   Physical Exam Well-developed male in no acute distress Nose without purulence or discharge noted No skin breakdown or pressure necrosis from the C Pap mask Neck without lymphadenopathy or thyromegaly Lower extremities without edema, no cyanosis Alert and oriented, moves all 4 extremities.       Assessment & Plan:

## 2014-11-30 NOTE — Patient Instructions (Signed)
Will discontinue the cpap, and refer you to a dentist to consider a dental appliance for your sleep apnea.  Keep working on modest weight loss

## 2014-11-30 NOTE — Assessment & Plan Note (Signed)
The patient feels very strongly that he is completely intolerant of sleep apnea, and we'll not be able to wear the device.  The good news here is that he has very mild sleep apnea, which really does not impact his cardiovascular health. I have discussed with him just working on modest weight loss over the next 6-12 months and not treating his sleep apnea further, versus referral to dental medicine for consideration of an appliance. The patient would like to try and treat his sleep apnea to help his and his wife's sleep

## 2014-12-19 ENCOUNTER — Ambulatory Visit (INDEPENDENT_AMBULATORY_CARE_PROVIDER_SITE_OTHER): Payer: Medicare Other | Admitting: Gastroenterology

## 2014-12-19 ENCOUNTER — Encounter: Payer: Self-pay | Admitting: Gastroenterology

## 2014-12-19 VITALS — BP 120/82 | HR 92 | Ht 71.0 in | Wt 167.4 lb

## 2014-12-19 DIAGNOSIS — K2270 Barrett's esophagus without dysplasia: Secondary | ICD-10-CM

## 2014-12-19 MED ORDER — ESOMEPRAZOLE MAGNESIUM 40 MG PO CPDR: 40.0000 mg | DELAYED_RELEASE_CAPSULE | Freq: Every day | ORAL | Status: DC

## 2014-12-19 NOTE — Patient Instructions (Addendum)
PPI once daily, we will clarify with your insurance which they prefer and then will prescribe a years worth. Samples of nexium for now. EGD in 10/2015 already in recall system. Copy of 10/2014 colonoscopy printed.

## 2014-12-19 NOTE — Progress Notes (Signed)
Review of pertinent gastrointestinal problems: 1.  Chronic GERD. Pyrosis stabbing chest discomforts. Improved while on PPI. Also improved with dietary modifications (quit drinking 4 coffees daily). EGD November 2007 no esophagitis. Non-nodular 1.5 cm single tongue of Barrett's esophagus. No dysplasia seen on  biopsies. 2. Barrett's esophagus. November 2007 EGD with single tongue of salmon-colored mucosa short segment. Biopsies showed no dysplasia.  EGD 2008 same (IM without dysplasia). EGD 2010 same, EGD 2013 same (IM without dysplasia), recall 3 years. 3. Adenomatous polyps in colon; Colonoscopy Ardis Hughs 10/2014 done for screening, recent diarrhea; 2 small TAs; recommended 5 year recall.  HPI: This is a  very pleasant 53 year old man whom I last saw the time of colonoscopy about a month and a half ago.  Here to discuss his nexium.    Here for samples of nexium,  He tells me it works better than others.  Takes 1-2 times per week.  He has only been taking proton pump inhibitor on an as-needed basis about 1-2 times per week. No dysphagia.     Past Medical History  Diagnosis Date  . Deaf     Call [phone] for patient's sign language interpreter.   . Barrett's esophagus 10-25-14 pt denies    states "minor"  . Asthma     prn inhaler  . TIA (transient ischemic attack) 10-25-14 pt denies    no current deficits  . Seasonal allergies   . High cholesterol   . Heartburn     occasional, related to certain foods  . Arthritis     right knee  . 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 Intermittent palpitations 10-25-14 patient denies    has had a pretty significant workup but no true arrhythmias being found as of 2013; Holter monitor with occasional bigeminy and trigeminy with quadrigeminy. Also noted supraventricular ectopy, but was intolerant of beta blockers at the time.    Past Surgical History  Procedure Laterality Date  . Bunionectomy Left 2012    right done also  . Knee arthroscopy  Left 06/02/2012  . Knee arthroscopy Right fall 2013    x 2  . Inguinal hernia repair    . Cardiac catheterization  06/24/2011    wwidely patent normal coronary arteries with normal ejection fraction  . Knee arthroscopy Left 03/03/2013    Procedure: ARTHROSCOPY KNEE WITH CHONDROPLASTY PATELLA  AND LATERAL TIBIAL PLATEAU;  Surgeon: Kerin Salen, MD;  Location: [facility];  Service: Orthopedics;  Laterality: Left;  . Foot implant removal Right 07/15/2013    @ [place]  . Nm myoview ltd  2012    small fixed basal inferior artifact. Normal EF.  . Carotid artery dopplers  2012    tortuous but no stenoses.. No subclavian disease  . Upper and lower extremity arterial dopplers  2012    normal arterial flow bilateral upper extremities including subclavian is a carotid;;R. ABI 1.2, L. ABI 1.28. Normal flow velocities. Likely calcified vessels.  . Abdominal aortic ultrasound  2012    normal abdominal aorta  . Transthoracic echocardiogram  June2015    nnormal EF: 55-60%. No wall motion abnormalities. Gr 1 DD.  Mild MR. Otherwise normal  . Colonoscopy with propofol N/A 11/03/2014    Procedure: COLONOSCOPY WITH PROPOFOL;  Surgeon: Milus Banister, MD;  Location: [facility];  Service: Endoscopy;  Laterality: N/A;    Current Outpatient Prescriptions  Medication Sig Dispense Refill  . albuterol (PROVENTIL) (2.5 MG/3ML) 0.083% nebulizer solution Take 3 mLs (2.5 mg total)  by nebulization every 6 (six) hours as needed for wheezing. 75 mL 12  . aspirin EC 325 MG tablet Take 325 mg by mouth daily.    . fluticasone (FLONASE) 50 MCG/ACT nasal spray Place 1 spray into both nostrils daily.    . Fluticasone-Salmeterol (ADVAIR) 250-50 MCG/DOSE AEPB Inhale 1 puff into the lungs daily.     Marland Kitchen HYDROcodone-acetaminophen (NORCO/VICODIN) 5-325 MG per tablet Take 1-2 tablets by mouth every 4 (four) hours as needed for moderate pain. 20 tablet 0  . mometasone-formoterol (DULERA) 100-5 MCG/ACT AERO Inhale 1 puff into  the lungs daily.     No current facility-administered medications for this visit.    Allergies as of 12/19/2014 - Review Complete 12/19/2014  Allergen Reaction Noted  . Simvastatin Other (See Comments) 03/01/2013  . Fish allergy Rash 11/02/2012    Family History  Problem Relation Age of Onset  . Ovarian cancer Mother   . Coronary artery disease Father     History   Social History  . Marital Status: Married    Spouse Name: N/A    Number of Children: 1  . Years of Education: college   Occupational History  . disability    Social History Main Topics  . Smoking status: Never Smoker   . Smokeless tobacco: Never Used  . Alcohol Use: No  . Drug Use: No  . Sexual Activity: Not on file   Other Topics Concern  . Not on file   Social History Narrative   He is deaf, requiring an interpreter.   He started a new job back in 2013, which was much less stressful for him.  He has subsequently gone on to disability.   He is a married father of one. He previously wrote his bike 2 times a week.   Does not smoke or drink.            Physical Exam: BP 120/82 mmHg  Pulse 92  Ht 5\' 11"  (1.803 m)  Wt 167 lb 6 oz (75.921 kg)  BMI 23.35 kg/m2 Constitutional: generally well-appearing Deaf, works with hearing interpreter Psychiatric: alert and oriented x3 Abdomen: soft, nontender, nondistended, no obvious ascites, no peritoneal signs, normal bowel sounds     Assessment and plan: 53 y.o. male with Barrett's esophagus, personal history of adenomatous polyps  I am giving him samples of Nexium today. We will find out from his insurance company which proton pump inhibitor if they prefer and I'll prescribe him that he take 1 pill once daily indefinitely. He is not due for Barrett's surveillance for another year and he is already in our recall system for that.

## 2015-01-10 ENCOUNTER — Other Ambulatory Visit: Payer: Self-pay | Admitting: Orthopedic Surgery

## 2015-01-10 DIAGNOSIS — S42101A Fracture of unspecified part of scapula, right shoulder, initial encounter for closed fracture: Secondary | ICD-10-CM

## 2015-01-13 ENCOUNTER — Other Ambulatory Visit: Payer: Self-pay

## 2015-01-16 ENCOUNTER — Ambulatory Visit
Admission: RE | Admit: 2015-01-16 | Discharge: 2015-01-16 | Disposition: A | Discharge status: Home / Self Care | Payer: PRIVATE HEALTH INSURANCE | Source: Ambulatory Visit | Attending: Orthopedic Surgery | Admitting: Orthopedic Surgery

## 2015-01-16 DIAGNOSIS — S42101A Fracture of unspecified part of scapula, right shoulder, initial encounter for closed fracture: Secondary | ICD-10-CM

## 2015-04-16 ENCOUNTER — Emergency Department (HOSPITAL_BASED_OUTPATIENT_CLINIC_OR_DEPARTMENT_OTHER)
Admission: EM | Admit: 2015-04-16 | Discharge: 2015-04-16 | Disposition: A | Discharge status: Home / Self Care | Payer: Medicare Other | Attending: Emergency Medicine | Admitting: Emergency Medicine

## 2015-04-16 ENCOUNTER — Emergency Department (HOSPITAL_BASED_OUTPATIENT_CLINIC_OR_DEPARTMENT_OTHER): Payer: Medicare Other

## 2015-04-16 ENCOUNTER — Encounter (HOSPITAL_BASED_OUTPATIENT_CLINIC_OR_DEPARTMENT_OTHER): Payer: Self-pay | Admitting: *Deleted

## 2015-04-16 DIAGNOSIS — H9190 Unspecified hearing loss, unspecified ear: Secondary | ICD-10-CM | POA: Insufficient documentation

## 2015-04-16 DIAGNOSIS — Z8639 Personal history of other endocrine, nutritional and metabolic disease: Secondary | ICD-10-CM | POA: Diagnosis not present

## 2015-04-16 DIAGNOSIS — Z7951 Long term (current) use of inhaled steroids: Secondary | ICD-10-CM | POA: Insufficient documentation

## 2015-04-16 DIAGNOSIS — J45901 Unspecified asthma with (acute) exacerbation: Secondary | ICD-10-CM | POA: Insufficient documentation

## 2015-04-16 DIAGNOSIS — Z9889 Other specified postprocedural states: Secondary | ICD-10-CM | POA: Insufficient documentation

## 2015-04-16 DIAGNOSIS — M1990 Unspecified osteoarthritis, unspecified site: Secondary | ICD-10-CM | POA: Insufficient documentation

## 2015-04-16 DIAGNOSIS — Z8673 Personal history of transient ischemic attack (TIA), and cerebral infarction without residual deficits: Secondary | ICD-10-CM | POA: Diagnosis not present

## 2015-04-16 DIAGNOSIS — J45909 Unspecified asthma, uncomplicated: Secondary | ICD-10-CM | POA: Diagnosis present

## 2015-04-16 DIAGNOSIS — Z8719 Personal history of other diseases of the digestive system: Secondary | ICD-10-CM | POA: Insufficient documentation

## 2015-04-16 DIAGNOSIS — R0602 Shortness of breath: Secondary | ICD-10-CM

## 2015-04-16 DIAGNOSIS — Z79899 Other long term (current) drug therapy: Secondary | ICD-10-CM | POA: Insufficient documentation

## 2015-04-16 LAB — TROPONIN I: Troponin I: <0.03 ng/mL (ref <0.031)

## 2015-04-16 LAB — COMPREHENSIVE METABOLIC PANEL
ALBUMIN: 4 g/dL (ref 3.5–5.2)
ALK PHOS: 44 U/L (ref 39–117)
ALT: 14 U/L (ref 0–53)
ANION GAP: 7 (ref 5–15)
AST: 20 U/L (ref 0–37)
BUN: 15 mg/dL (ref 6–23)
CO2: 24 mmol/L (ref 19–32)
Calcium: 8.8 mg/dL (ref 8.4–10.5)
Chloride: 107 mmol/L (ref 96–112)
Creatinine, Ser: 0.76 mg/dL (ref 0.50–1.35)
GFR calc non Af Amer: >90 mL/min (ref >90)
GLUCOSE: 119 mg/dL — AB (ref 70–99)
Potassium: 3.8 mmol/L (ref 3.5–5.1)
Sodium: 138 mmol/L (ref 135–145)
Total Bilirubin: 0.6 mg/dL (ref 0.3–1.2)
Total Protein: 7.1 g/dL (ref 6.0–8.3)

## 2015-04-16 LAB — CBC WITH DIFFERENTIAL/PLATELET
BASOS PCT: 1 % (ref 0–1)
Basophils Absolute: 0 10*3/uL (ref 0.0–0.1)
EOS PCT: 2 % (ref 0–5)
Eosinophils Absolute: 0.1 10*3/uL (ref 0.0–0.7)
HEMATOCRIT: 43.4 % (ref 39.0–52.0)
Hemoglobin: 14.3 g/dL (ref 13.0–17.0)
LYMPHS ABS: 1.7 10*3/uL (ref 0.7–4.0)
LYMPHS PCT: 31 % (ref 12–46)
MCH: 29.9 pg (ref 26.0–34.0)
MCHC: 32.9 g/dL (ref 30.0–36.0)
MCV: 90.8 fL (ref 78.0–100.0)
MONO ABS: 0.4 10*3/uL (ref 0.1–1.0)
Monocytes Relative: 8 % (ref 3–12)
NEUTROS PCT: 58 % (ref 43–77)
Neutro Abs: 3.3 10*3/uL (ref 1.7–7.7)
Platelets: 192 10*3/uL (ref 150–400)
RBC: 4.78 MIL/uL (ref 4.22–5.81)
RDW: 12.7 % (ref 11.5–15.5)
WBC: 5.5 10*3/uL (ref 4.0–10.5)

## 2015-04-16 MED ORDER — ALBUTEROL SULFATE HFA 108 (90 BASE) MCG/ACT IN AERS: 1.0000 | INHALATION_SPRAY | Freq: Four times a day (QID) | RESPIRATORY_TRACT | Status: DC | PRN

## 2015-04-16 MED ORDER — ALBUTEROL SULFATE HFA 108 (90 BASE) MCG/ACT IN AERS
2.0000 | INHALATION_SPRAY | Freq: Once | RESPIRATORY_TRACT | Status: AC
Start: 2015-04-16 — End: 2015-04-16
  Administered 2015-04-16: 2 via RESPIRATORY_TRACT
  Filled 2015-04-16: qty 6.7

## 2015-04-16 MED ORDER — ALBUTEROL SULFATE (2.5 MG/3ML) 0.083% IN NEBU: 2.5000 mg | INHALATION_SOLUTION | RESPIRATORY_TRACT | Status: DC | PRN

## 2015-04-16 NOTE — ED Notes (Signed)
Patient transported to X-ray 

## 2015-04-16 NOTE — ED Provider Notes (Signed)
CSN: [id number]     Arrival date & time 04/16/15  1001 History   First MD Initiated Contact with Patient 04/16/15 1059     Chief Complaint  Patient presents with  . Asthma     (Consider location/radiation/quality/duration/timing/severity/associated sxs/prior Treatment) Patient is a 54 y.o. male presenting with asthma. The history is provided by the patient.  Asthma This is a chronic problem. The current episode started more than 1 week ago. The problem occurs every several days. The problem has not changed since onset.Associated symptoms include shortness of breath. Pertinent negatives include no chest pain, no abdominal pain and no headaches. Exacerbated by: being out of his inhaler. Nothing relieves the symptoms. He has tried nothing for the symptoms. The treatment provided no relief.    Past Medical History  Diagnosis Date  . Deaf     Call [phone] for patient's sign language interpreter.   . Barrett's esophagus 10-25-14 pt denies    states "minor"  . Asthma     prn inhaler  . TIA (transient ischemic attack) 10-25-14 pt denies    no current deficits  . Seasonal allergies   . High cholesterol   . Heartburn     occasional, related to certain foods  . Arthritis     right knee  . 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 Intermittent palpitations 10-25-14 patient denies    has had a pretty significant workup but no true arrhythmias being found as of 2013; Holter monitor with occasional bigeminy and trigeminy with quadrigeminy. Also noted supraventricular ectopy, but was intolerant of beta blockers at the time.   Past Surgical History  Procedure Laterality Date  . Bunionectomy Left 2012    right done also  . Knee arthroscopy Left 06/02/2012  . Knee arthroscopy Right fall 2013    x 2  . Inguinal hernia repair    . Cardiac catheterization  06/24/2011    wwidely patent normal coronary arteries with normal ejection fraction  . Knee arthroscopy Left 03/03/2013    Procedure:  ARTHROSCOPY KNEE WITH CHONDROPLASTY PATELLA  AND LATERAL TIBIAL PLATEAU;  Surgeon: Kerin Salen, MD;  Location: [facility];  Service: Orthopedics;  Laterality: Left;  . Foot implant removal Right 07/15/2013    @ [place]  . Nm myoview ltd  2012    small fixed basal inferior artifact. Normal EF.  . Carotid artery dopplers  2012    tortuous but no stenoses.. No subclavian disease  . Upper and lower extremity arterial dopplers  2012    normal arterial flow bilateral upper extremities including subclavian is a carotid;;R. ABI 1.2, L. ABI 1.28. Normal flow velocities. Likely calcified vessels.  . Abdominal aortic ultrasound  2012    normal abdominal aorta  . Transthoracic echocardiogram  June2015    nnormal EF: 55-60%. No wall motion abnormalities. Gr 1 DD.  Mild MR. Otherwise normal  . Colonoscopy with propofol N/A 11/03/2014    Procedure: COLONOSCOPY WITH PROPOFOL;  Surgeon: Milus Banister, MD;  Location: [facility];  Service: Endoscopy;  Laterality: N/A;   Family History  Problem Relation Age of Onset  . Ovarian cancer Mother   . Coronary artery disease Father    History  Substance Use Topics  . Smoking status: Never Smoker   . Smokeless tobacco: Never Used  . Alcohol Use: No    Review of Systems  Constitutional: Negative for fever.  HENT: Negative for drooling and rhinorrhea.   Eyes: Negative for pain.  Respiratory: Positive  for shortness of breath. Negative for cough.   Cardiovascular: Negative for chest pain and leg swelling.  Gastrointestinal: Negative for nausea, vomiting, abdominal pain and diarrhea.  Genitourinary: Negative for dysuria and hematuria.  Musculoskeletal: Negative for gait problem and neck pain.  Skin: Negative for color change.  Neurological: Negative for numbness and headaches.  Hematological: Negative for adenopathy.  Psychiatric/Behavioral: Negative for behavioral problems.  All other systems reviewed and are negative.     Allergies   Simvastatin and Fish allergy  Home Medications   Prior to Admission medications   Medication Sig Start Date End Date Taking? Authorizing Provider  aspirin EC 325 MG tablet Take 325 mg by mouth daily.   Yes Historical Provider, MD  fluticasone (FLONASE) 50 MCG/ACT nasal spray Place 1 spray into both nostrils daily. 02/04/14  Yes Historical Provider, MD  Fluticasone-Salmeterol (ADVAIR) 250-50 MCG/DOSE AEPB Inhale 1 puff into the lungs daily.    Yes Historical Provider, MD  albuterol (PROVENTIL) (2.5 MG/3ML) 0.083% nebulizer solution Take 3 mLs (2.5 mg total) by nebulization every 6 (six) hours as needed for wheezing. 08/24/13   Noemi Chapel, MD  HYDROcodone-acetaminophen (NORCO/VICODIN) 5-325 MG per tablet Take 1-2 tablets by mouth every 4 (four) hours as needed for moderate pain. 11/04/14   Orpah Greek, MD  mometasone-formoterol (DULERA) 100-5 MCG/ACT AERO Inhale 1 puff into the lungs daily.    Historical Provider, MD   BP 125/83 mmHg  Pulse 79  Temp(Src) 97.8 F (36.6 C) (Oral)  Resp 20  SpO2 100% Physical Exam  Constitutional: He is oriented to person, place, and time. He appears well-developed and well-nourished.  HENT:  Head: Normocephalic and atraumatic.  Right Ear: External ear normal.  Left Ear: External ear normal.  Nose: Nose normal.  Mouth/Throat: Oropharynx is clear and moist. No oropharyngeal exudate.  Eyes: Conjunctivae and EOM are normal. Pupils are equal, round, and reactive to light.  Neck: Normal range of motion. Neck supple.  Cardiovascular: Normal rate, regular rhythm, normal heart sounds and intact distal pulses.  Exam reveals no gallop and no friction rub.   No murmur heard. Pulmonary/Chest: Effort normal and breath sounds normal. No respiratory distress. He has no wheezes.  Abdominal: Soft. Bowel sounds are normal. He exhibits no distension. There is no tenderness. There is no rebound and no guarding.  Musculoskeletal: Normal range of motion. He exhibits  no edema or tenderness.  Symmetric lower extremities without focal tenderness.  Neurological: He is alert and oriented to person, place, and time.  Skin: Skin is warm and dry.  Psychiatric: He has a normal mood and affect. His behavior is normal.  Nursing note and vitals reviewed.   ED Course  Procedures (including critical care time) Labs Review Labs Reviewed  CBC WITH DIFFERENTIAL/PLATELET  COMPREHENSIVE METABOLIC PANEL  TROPONIN I    Imaging Review Dg Chest 2 View  04/16/2015   CLINICAL DATA:  Cough today.  History of asthma.  EXAM: CHEST  2 VIEW  COMPARISON:  11/14/2014  FINDINGS: The heart is borderline enlarged but stable. Mild tortuosity of the thoracic aorta. Chronic bronchitic type interstitial lung changes likely due to history of asthma. Stable apical densities. No infiltrates or effusions. The bony thorax is intact.  IMPRESSION: Chronic bronchitic type lung changes but no acute pulmonary findings.   Electronically Signed   By: Marijo Sanes M.D.   On: 04/16/2015 10:42     EKG Interpretation   Date/Time:  Sunday April 16 2015 10:28:03 EDT Ventricular Rate:  71  PR Interval:  140 QRS Duration: 84 QT Interval:  382 QTC Calculation: 424 R Axis:   5 Text Interpretation:  Normal sinus rhythm Minimal voltage criteria for  LVH, may be normal variant Cannot rule out Inferior infarct , age  undetermined Otherwise no significant change Confirmed by Javonne Louissaint  MD,  Kelse Ploch ([id number]) on 04/16/2015 10:37:41 AM      MDM   Final diagnoses:  SOB (shortness of breath)    11:10 AM 54 y.o. male w hx of TIA, asthma, deaf who presents with mild shortness of breath since about 9 AM this morning. He states he has been out of his albuterol inhaler for about 2 months. He states that he feels like he has some mucus in his lungs. He denies any cough. He denies any pain including chest pain. Vital signs unremarkable here.  11:46 AM:  I have discussed the diagnosis/risks/treatment options with  the patient and believe the pt to be eligible for discharge home to follow-up with his pcp. We also discussed returning to the ED immediately if new or worsening sx occur. We discussed the sx which are most concerning (e.g., worsening sob, fever, cp) that necessitate immediate return. Medications administered to the patient during their visit and any new prescriptions provided to the patient are listed below.  Medications given during this visit Medications  albuterol (PROVENTIL HFA;VENTOLIN HFA) 108 (90 BASE) MCG/ACT inhaler 2 puff (2 puffs Inhalation Given 04/16/15 1116)    New Prescriptions   ALBUTEROL (PROVENTIL HFA;VENTOLIN HFA) 108 (90 BASE) MCG/ACT INHALER    Inhale 1-2 puffs into the lungs every 6 (six) hours as needed for wheezing or shortness of breath.      Pamella Pert, MD 04/16/15 1146

## 2015-04-16 NOTE — Discharge Instructions (Signed)
Shortness of Breath °Shortness of breath means you have trouble breathing. Shortness of breath needs medical care right away. °HOME CARE  °· Do not smoke. °· Avoid being around chemicals or things (paint fumes, dust) that may bother your breathing. °· Rest as needed. Slowly begin your normal activities. °· Only take medicines as told by your doctor. °· Keep all doctor visits as told. °GET HELP RIGHT AWAY IF:  °· Your shortness of breath gets worse. °· You feel lightheaded, pass out (faint), or have a cough that is not helped by medicine. °· You cough up blood. °· You have pain with breathing. °· You have pain in your chest, arms, shoulders, or belly (abdomen). °· You have a fever. °· You cannot walk up stairs or exercise the way you normally do. °· You do not get better in the time expected. °· You have a hard time doing normal activities even with rest. °· You have problems with your medicines. °· You have any new symptoms. °MAKE SURE YOU: °· Understand these instructions. °· Will watch your condition. °· Will get help right away if you are not doing well or get worse. °Document Released: 06/03/2008 Document Revised: 12/21/2013 Document Reviewed: 03/02/2012 °[employer]® Patient Information ©2015 [employer]. This information is not intended to replace advice given to you by your health care provider. Make sure you discuss any questions you have with your health care provider. ° °

## 2015-04-16 NOTE — ED Notes (Addendum)
Pt has hx of asthma- reports he is out of his inhaler and nebs- denies chest pain- reports left lung "feels more heavy because of mucous"

## 2015-04-16 NOTE — ED Notes (Addendum)
D/c home with rx x 2 for albuterol inhaler and nebs- inhaler given for home use with instructions by RT

## 2015-04-30 ENCOUNTER — Encounter (HOSPITAL_BASED_OUTPATIENT_CLINIC_OR_DEPARTMENT_OTHER): Payer: Self-pay | Admitting: Emergency Medicine

## 2015-04-30 ENCOUNTER — Emergency Department (HOSPITAL_BASED_OUTPATIENT_CLINIC_OR_DEPARTMENT_OTHER): Payer: Medicare Other

## 2015-04-30 ENCOUNTER — Emergency Department (HOSPITAL_BASED_OUTPATIENT_CLINIC_OR_DEPARTMENT_OTHER)
Admission: EM | Admit: 2015-04-30 | Discharge: 2015-04-30 | Disposition: A | Discharge status: Home / Self Care | Payer: Medicare Other | Attending: Emergency Medicine | Admitting: Emergency Medicine

## 2015-04-30 DIAGNOSIS — Z7951 Long term (current) use of inhaled steroids: Secondary | ICD-10-CM | POA: Diagnosis not present

## 2015-04-30 DIAGNOSIS — R42 Dizziness and giddiness: Secondary | ICD-10-CM | POA: Insufficient documentation

## 2015-04-30 DIAGNOSIS — M1711 Unilateral primary osteoarthritis, right knee: Secondary | ICD-10-CM | POA: Diagnosis not present

## 2015-04-30 DIAGNOSIS — Z8719 Personal history of other diseases of the digestive system: Secondary | ICD-10-CM | POA: Insufficient documentation

## 2015-04-30 DIAGNOSIS — H9190 Unspecified hearing loss, unspecified ear: Secondary | ICD-10-CM | POA: Insufficient documentation

## 2015-04-30 DIAGNOSIS — J45909 Unspecified asthma, uncomplicated: Secondary | ICD-10-CM | POA: Insufficient documentation

## 2015-04-30 DIAGNOSIS — Z8639 Personal history of other endocrine, nutritional and metabolic disease: Secondary | ICD-10-CM | POA: Insufficient documentation

## 2015-04-30 DIAGNOSIS — Z8673 Personal history of transient ischemic attack (TIA), and cerebral infarction without residual deficits: Secondary | ICD-10-CM | POA: Diagnosis not present

## 2015-04-30 DIAGNOSIS — Z7982 Long term (current) use of aspirin: Secondary | ICD-10-CM | POA: Insufficient documentation

## 2015-04-30 DIAGNOSIS — Z9889 Other specified postprocedural states: Secondary | ICD-10-CM | POA: Insufficient documentation

## 2015-04-30 DIAGNOSIS — R002 Palpitations: Secondary | ICD-10-CM | POA: Diagnosis not present

## 2015-04-30 DIAGNOSIS — R079 Chest pain, unspecified: Secondary | ICD-10-CM | POA: Diagnosis not present

## 2015-04-30 DIAGNOSIS — Z79899 Other long term (current) drug therapy: Secondary | ICD-10-CM | POA: Insufficient documentation

## 2015-04-30 LAB — BASIC METABOLIC PANEL
Anion gap: 6 (ref 5–15)
BUN: 15 mg/dL (ref 6–20)
CO2: 24 mmol/L (ref 22–32)
Calcium: 8.5 mg/dL — ABNORMAL LOW (ref 8.9–10.3)
Chloride: 107 mmol/L (ref 101–111)
Creatinine, Ser: 0.8 mg/dL (ref 0.61–1.24)
Glucose, Bld: 130 mg/dL — ABNORMAL HIGH (ref 70–99)
Potassium: 3.5 mmol/L (ref 3.5–5.1)
Sodium: 137 mmol/L (ref 135–145)

## 2015-04-30 LAB — CBC WITH DIFFERENTIAL/PLATELET
Basophils Absolute: 0 10*3/uL (ref 0.0–0.1)
Basophils Relative: 0 % (ref 0–1)
EOS ABS: 0.2 10*3/uL (ref 0.0–0.7)
Eosinophils Relative: 2 % (ref 0–5)
HEMATOCRIT: 43 % (ref 39.0–52.0)
HEMOGLOBIN: 14.5 g/dL (ref 13.0–17.0)
Lymphocytes Relative: 28 % (ref 12–46)
Lymphs Abs: 2.1 10*3/uL (ref 0.7–4.0)
MCH: 30.5 pg (ref 26.0–34.0)
MCHC: 33.7 g/dL (ref 30.0–36.0)
MCV: 90.5 fL (ref 78.0–100.0)
MONOS PCT: 8 % (ref 3–12)
Monocytes Absolute: 0.6 10*3/uL (ref 0.1–1.0)
NEUTROS ABS: 4.6 10*3/uL (ref 1.7–7.7)
Neutrophils Relative %: 62 % (ref 43–77)
Platelets: 226 10*3/uL (ref 150–400)
RBC: 4.75 MIL/uL (ref 4.22–5.81)
RDW: 12.9 % (ref 11.5–15.5)
WBC: 7.4 10*3/uL (ref 4.0–10.5)

## 2015-04-30 LAB — TROPONIN I: Troponin I: <0.03 ng/mL (ref <0.031)

## 2015-04-30 NOTE — ED Notes (Signed)
Patient here with friend from church.  Friend reports patient is married but wife does not speak english or sign language and is disabled.  Friend reports her husband is usually the one to take him to medical appointments but was unable to today.  Friend also reports patient fell while at church today.

## 2015-04-30 NOTE — ED Notes (Signed)
Interpretor services contacted and attempting to find sign interpretor for patient

## 2015-04-30 NOTE — ED Notes (Signed)
Patient reports chest pain which began 3 days ago.

## 2015-04-30 NOTE — ED Notes (Signed)
Pt c/o feeling like his heart is "doing flip flops". HR noted to be 74. RN notified

## 2015-04-30 NOTE — ED Provider Notes (Signed)
CSN: [id number]     Arrival date & time 04/30/15  1130 History   First MD Initiated Contact with Patient 04/30/15 1233     Chief Complaint  Patient presents with  . Chest Pain     (Consider location/radiation/quality/duration/timing/severity/associated sxs/prior Treatment) Patient is a 54 y.o. male presenting with palpitations.  Palpitations Palpitations quality:  Fast Onset quality:  Sudden Duration: several minutes. Timing:  Constant Progression:  Resolved (intermittent) Chronicity:  Recurrent Context comment:  Started happening a few days ago.  went to church today and symptoms began again.  He felt weak and dizzy.  fell, was caught, did not pass out.   Relieved by:  Nothing Worsened by:  Nothing Associated symptoms: chest pain (dull), dizziness and shortness of breath (intermittent, not associated with the palpitations, none now)   Associated symptoms: no nausea, no syncope and no vomiting     Past Medical History  Diagnosis Date  . Deaf     Call [phone] for patient's sign language interpreter.   . Barrett's esophagus 10-25-14 pt denies    states "minor"  . Asthma     prn inhaler  . TIA (transient ischemic attack) 10-25-14 pt denies    no current deficits  . Seasonal allergies   . High cholesterol   . Heartburn     occasional, related to certain foods  . Arthritis     right knee  . 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 Intermittent palpitations 10-25-14 patient denies    has had a pretty significant workup but no true arrhythmias being found as of 2013; Holter monitor with occasional bigeminy and trigeminy with quadrigeminy. Also noted supraventricular ectopy, but was intolerant of beta blockers at the time.   Past Surgical History  Procedure Laterality Date  . Bunionectomy Left 2012    right done also  . Knee arthroscopy Left 06/02/2012  . Knee arthroscopy Right fall 2013    x 2  . Inguinal hernia repair    . Cardiac catheterization  06/24/2011    wwidely  patent normal coronary arteries with normal ejection fraction  . Knee arthroscopy Left 03/03/2013    Procedure: ARTHROSCOPY KNEE WITH CHONDROPLASTY PATELLA  AND LATERAL TIBIAL PLATEAU;  Surgeon: Kerin Salen, MD;  Location: [place];  Service: Orthopedics;  Laterality: Left;  . Foot implant removal Right 07/15/2013    @ [place]  . Nm myoview ltd  2012    small fixed basal inferior artifact. Normal EF.  . Carotid artery dopplers  2012    tortuous but no stenoses.. No subclavian disease  . Upper and lower extremity arterial dopplers  2012    normal arterial flow bilateral upper extremities including subclavian is a carotid;;R. ABI 1.2, L. ABI 1.28. Normal flow velocities. Likely calcified vessels.  . Abdominal aortic ultrasound  2012    normal abdominal aorta  . Transthoracic echocardiogram  June2015    nnormal EF: 55-60%. No wall motion abnormalities. Gr 1 DD.  Mild MR. Otherwise normal  . Colonoscopy with propofol N/A 11/03/2014    Procedure: COLONOSCOPY WITH PROPOFOL;  Surgeon: Milus Banister, MD;  Location: [facility];  Service: Endoscopy;  Laterality: N/A;   Family History  Problem Relation Age of Onset  . Ovarian cancer Mother   . Coronary artery disease Father    History  Substance Use Topics  . Smoking status: Never Smoker   . Smokeless tobacco: Never Used  . Alcohol Use: No    Review of Systems  Respiratory: Positive for shortness of breath (intermittent, not associated with the palpitations, none now).   Cardiovascular: Positive for chest pain (dull) and palpitations. Negative for syncope.  Gastrointestinal: Negative for nausea and vomiting.  Neurological: Positive for dizziness.  All other systems reviewed and are negative.     Allergies  Simvastatin and Fish allergy  Home Medications   Prior to Admission medications   Medication Sig Start Date End Date Taking? Authorizing Provider  albuterol (PROVENTIL HFA;VENTOLIN HFA) 108 (90 BASE) MCG/ACT  inhaler Inhale 1-2 puffs into the lungs every 6 (six) hours as needed for wheezing or shortness of breath. 04/16/15   Pamella Pert, MD  albuterol (PROVENTIL) (2.5 MG/3ML) 0.083% nebulizer solution Take 3 mLs (2.5 mg total) by nebulization every 6 (six) hours as needed for wheezing. 08/24/13   Noemi Chapel, MD  albuterol (PROVENTIL) (2.5 MG/3ML) 0.083% nebulizer solution Take 3 mLs (2.5 mg total) by nebulization every 4 (four) hours as needed for wheezing or shortness of breath. 04/16/15   Pamella Pert, MD  aspirin EC 325 MG tablet Take 325 mg by mouth daily.    Historical Provider, MD  fluticasone (FLONASE) 50 MCG/ACT nasal spray Place 1 spray into both nostrils daily. 02/04/14   Historical Provider, MD  Fluticasone-Salmeterol (ADVAIR) 250-50 MCG/DOSE AEPB Inhale 1 puff into the lungs daily.     Historical Provider, MD  HYDROcodone-acetaminophen (NORCO/VICODIN) 5-325 MG per tablet Take 1-2 tablets by mouth every 4 (four) hours as needed for moderate pain. 11/04/14   Orpah Greek, MD  mometasone-formoterol (DULERA) 100-5 MCG/ACT AERO Inhale 1 puff into the lungs daily.    Historical Provider, MD   BP 123/82 mmHg  Pulse 70  Temp(Src) 98.1 F (36.7 C) (Oral)  Resp 20  Ht 5\' 11"  (1.803 m)  Wt 168 lb (76.204 kg)  BMI 23.44 kg/m2  SpO2 96% Physical Exam  Constitutional: He is oriented to person, place, and time. He appears well-developed and well-nourished. No distress.  HENT:  Head: Normocephalic and atraumatic.  Mouth/Throat: Oropharynx is clear and moist.  Eyes: Conjunctivae are normal. Pupils are equal, round, and reactive to light. No scleral icterus.  Neck: Neck supple.  Cardiovascular: Normal rate, regular rhythm, normal heart sounds and intact distal pulses.   No murmur heard. Pulmonary/Chest: Effort normal and breath sounds normal. No stridor. No respiratory distress. He has no wheezes. He has no rales.  Abdominal: Soft. He exhibits no distension. There is no tenderness.  There is no rebound and no guarding.  Musculoskeletal: Normal range of motion. He exhibits no edema.  Neurological: He is alert and oriented to person, place, and time.  Skin: Skin is warm and dry. No rash noted.  Psychiatric: He has a normal mood and affect. His behavior is normal.  Nursing note and vitals reviewed.   ED Course  Procedures (including critical care time) Labs Review Labs Reviewed  BASIC METABOLIC PANEL - Abnormal; Notable for the following:    Glucose, Bld 130 (*)    Calcium 8.5 (*)    All other components within normal limits  TROPONIN I  CBC WITH DIFFERENTIAL/PLATELET    Imaging Review Dg Chest 2 View  04/30/2015   CLINICAL DATA:  Three-day history of chest pain  EXAM: CHEST  2 VIEW  COMPARISON:  April 16, 2015  FINDINGS: There is no edema or consolidation. The heart size and pulmonary vascularity are normal. No adenopathy. No pneumothorax. There is mild degenerative change in the thoracic spine. There is evidence of an old  healed fracture of the right scapula.  IMPRESSION: No edema or consolidation.   Electronically Signed   By: Lowella Grip III M.D.   On: 04/30/2015 12:20     EKG Interpretation   Date/Time:  Sunday Apr 30 2015 11:36:01 EDT Ventricular Rate:  86 PR Interval:  148 QRS Duration: 84 QT Interval:  352 QTC Calculation: 421 R Axis:   12 Text Interpretation:  Normal sinus rhythm Moderate voltage criteria for  LVH, may be normal variant Borderline ECG No significant change was found  Confirmed by [facility]  MD, TREY ([id number]) on 04/30/2015 12:37:27 PM      MDM   Final diagnoses:  Palpitations    54 yo male with palpitations and dizziness.  Pt reports feeling similarly at some point in the past, had a workup for it, but can't remember much about it.  On chart review, he saw a cardiologist a few times last year.  That documentation reported chronic chest pain, two clean caths, last in 2012, and longterm monitoring which showed evidence of  paroxysmal SVT.  He was on metoprolol, but stopped in at some point in the past due to insurance reasons.   His symptoms are unlikely to represent ACS by history today.  Suspect he has continued palpitations from either PVCs (also has history of symptomatic PVCs) or SVT.  Advised he follow up closely with cardiology.  Return precautions given.     Serita Grit, MD 04/30/15 [phone]

## 2015-04-30 NOTE — Discharge Instructions (Signed)

## 2015-05-01 ENCOUNTER — Telehealth: Payer: Self-pay | Admitting: Cardiology

## 2015-05-01 NOTE — Telephone Encounter (Signed)
°  1. Which medications need to be refilled? Metoprolol  2. Which pharmacy is medication to be sent to?[facility]   3. Do they need a 30 day or 90 day supply? 30  4. Would they like a call back once the medication has been sent to the pharmacy? Yes

## 2015-05-01 NOTE — Telephone Encounter (Signed)
Left message via interpreter on [facility].  Per ED notes from 5/1, pt seen for palpitations. Historically had been on metoprolol (25mg  BID at last dose). Pt requesting refill on this medication.  Will send to Dr. Ellyn Hack to authorize restart of the prescription.

## 2015-05-01 NOTE — Telephone Encounter (Signed)
OK to refill  [facility]

## 2015-05-03 MED ORDER — METOPROLOL TARTRATE 25 MG PO TABS: 25.0000 mg | ORAL_TABLET | Freq: Two times a day (BID) | ORAL | Status: DC

## 2015-05-03 NOTE — Telephone Encounter (Signed)
Refill submitted to pt pharmacy.

## 2015-05-04 ENCOUNTER — Encounter: Payer: Self-pay | Admitting: Cardiology

## 2015-05-04 ENCOUNTER — Ambulatory Visit (INDEPENDENT_AMBULATORY_CARE_PROVIDER_SITE_OTHER): Payer: Medicare Other | Admitting: Cardiology

## 2015-05-04 VITALS — BP 108/80 | HR 64 | Ht 71.0 in | Wt 169.8 lb

## 2015-05-04 DIAGNOSIS — R55 Syncope and collapse: Secondary | ICD-10-CM | POA: Diagnosis not present

## 2015-05-04 DIAGNOSIS — I471 Supraventricular tachycardia: Secondary | ICD-10-CM

## 2015-05-04 DIAGNOSIS — R0789 Other chest pain: Secondary | ICD-10-CM | POA: Diagnosis not present

## 2015-05-04 DIAGNOSIS — R079 Chest pain, unspecified: Secondary | ICD-10-CM

## 2015-05-04 NOTE — Progress Notes (Signed)
PCP: Pcp Not In System  Clinic Note: Chief Complaint  Patient presents with  . Follow-up    11 month. dizziness and chest pain every so often. no swelling or pain swelling in legs.has had to go to ER for chest pain does not think his heart is doing well.    HPI: Nathaniel [name] is a 54 y.o. male with a PMH below who presents today for Hospital followup for episode of near syncope/palpitations.   He has had a complete evaluation for palpitations and chest pain and discomfort in various potential vascular episodes. He's had nuclear stress tests, echocardiogram, a vascular ultrasound all of which have been unrevealing.  Interval History: Sie has been doing relatively well his last visit, but over last 2-3 months he is feeling more dizzy than usual. Is often used as the worse with changing position. He describes episodes of feeling his heart racing again.  He went to the ER on Sunday 5/1 -- when his symptoms finally got to the point where he seems to pass out. He started havingdizzy spells Friday & Sat, but waited to go to the emergency room because these do not overly unusual for him.  Things and got worse on Sunday. He woke up and was feeling sort of weird he felt his heart was beating funny.  He went to [place], got dizzy & almost passed out.  Then started having CP.  He said his heart was beating funny and irregular, fast. His wife described as looking pale.  He did not actually pass out. Since then he really hasn't had any significant spells. Apparently he started to fall but was caught.  At this episode he felt discomfort in his chest and shortness of breath. It is only intermittent. He denied any other symptoms of nausea or vomiting.No loss of consciousness. No weakness or numbness on one side versus the other. He does indicate that he use these 3 meals a day and drinks 5-6 cups of water a day.   Other than this episode, he denies any chest pain or shortness of breath or exertion. The  most part his outpatient episodes and generally well-controlled. No PND, orthopnea or edema.  No TIA/amaurosis fugax symptoms. No melena, hematochezia, hematuria, or epstaxis. No claudication   Past Medical History  Diagnosis Date  . Deaf     Call [phone] for patient's sign language interpreter.   . Barrett's esophagus 10-25-14 pt denies    states "minor"  . Asthma     prn inhaler  . TIA (transient ischemic attack) 10-25-14 pt denies    no current deficits  . Seasonal allergies   . High cholesterol   . Heartburn     occasional, related to certain foods  . Arthritis     right knee  . 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 Intermittent palpitations 10-25-14 patient denies    has had a pretty significant workup but no true arrhythmias being found as of 2013; Holter monitor with occasional bigeminy and trigeminy with quadrigeminy. Also noted supraventricular ectopy, but was intolerant of beta blockers at the time.    Prior Cardiac Evaluation and Past Surgical History: Past Surgical History  Procedure Laterality Date  . Bunionectomy Left 2012    right done also  . Knee arthroscopy Left 06/02/2012  . Knee arthroscopy Right fall 2013    x 2  . Inguinal hernia repair    . Cardiac catheterization  06/24/2011    wwidely patent normal coronary arteries with  normal ejection fraction  . Knee arthroscopy Left 03/03/2013    Procedure: ARTHROSCOPY KNEE WITH CHONDROPLASTY PATELLA  AND LATERAL TIBIAL PLATEAU;  Surgeon: Kerin Salen, MD;  Location: [place];  Service: Orthopedics;  Laterality: Left;  . Foot implant removal Right 07/15/2013    @ [place]  . Nm myoview ltd  2012    small fixed basal inferior artifact. Normal EF.  . Carotid artery dopplers  2012    tortuous but no stenoses.. No subclavian disease  . Upper and lower extremity arterial dopplers  2012    normal arterial flow bilateral upper extremities including subclavian is a carotid;;R. ABI 1.2, L. ABI 1.28.  Normal flow velocities. Likely calcified vessels.  . Abdominal aortic ultrasound  2012    normal abdominal aorta  . Transthoracic echocardiogram  June2015    nnormal EF: 55-60%. No wall motion abnormalities. Gr 1 DD.  Mild MR. Otherwise normal  . Colonoscopy with propofol N/A 11/03/2014    Procedure: COLONOSCOPY WITH PROPOFOL;  Surgeon: Milus Banister, MD;  Location: [facility];  Service: Endoscopy;  Laterality: N/A;  .  ROS: A comprehensive was performed. Review of Systems  Respiratory:       Having some allergy symptoms  Gastrointestinal: Positive for nausea (Associated with his episodes). Negative for diarrhea, constipation, blood in stool and melena.  Genitourinary: Negative for hematuria.  Musculoskeletal: Negative for falls.  Neurological: Positive for dizziness.  Psychiatric/Behavioral: The patient is nervous/anxious.   All other systems reviewed and are negative.   Current Outpatient Prescriptions on File Prior to Visit  Medication Sig Dispense Refill  . albuterol (PROVENTIL HFA;VENTOLIN HFA) 108 (90 BASE) MCG/ACT inhaler Inhale 1-2 puffs into the lungs every 6 (six) hours as needed for wheezing or shortness of breath. 1 Inhaler 0  . albuterol (PROVENTIL) (2.5 MG/3ML) 0.083% nebulizer solution Take 3 mLs (2.5 mg total) by nebulization every 6 (six) hours as needed for wheezing. 75 mL 12  . albuterol (PROVENTIL) (2.5 MG/3ML) 0.083% nebulizer solution Take 3 mLs (2.5 mg total) by nebulization every 4 (four) hours as needed for wheezing or shortness of breath. 30 vial 0  . aspirin EC 325 MG tablet Take 325 mg by mouth daily.    . fluticasone (FLONASE) 50 MCG/ACT nasal spray Place 1 spray into both nostrils daily.    . Fluticasone-Salmeterol (ADVAIR) 250-50 MCG/DOSE AEPB Inhale 1 puff into the lungs daily.     . metoprolol tartrate (LOPRESSOR) 25 MG tablet Take 1 tablet (25 mg total) by mouth 2 (two) times daily. 180 tablet 1  . mometasone-formoterol (DULERA) 100-5 MCG/ACT AERO  Inhale 1 puff into the lungs daily.     No current facility-administered medications on file prior to visit.   Allergies  Allergen Reactions  . Simvastatin Other (See Comments)    DEPRESSION, VISUAL DISTURBANCE(FLASHING LIGHTS)  . Fish Allergy Rash    SOFTSHELL FISH    History  Substance Use Topics  . Smoking status: Never Smoker   . Smokeless tobacco: Never Used  . Alcohol Use: No   Family History  Problem Relation Age of Onset  . Ovarian cancer Mother   . Coronary artery disease Father     Wt Readings from Last 3 Encounters:  05/04/15 77.021 kg (169 lb 12.8 oz)  04/30/15 76.204 kg (168 lb)  12/19/14 75.921 kg (167 lb 6 oz)    PHYSICAL EXAM BP 108/80 mmHg  Pulse 64  Ht 5\' 11"  (1.803 m)  Wt 77.021 kg (169 lb  12.8 oz)  BMI 23.69 kg/m2 General: Pleasant, NAD. Speaks using sign language ; healthy appearing, but mildly disheveled. Psych: Normal affect. Easily excited Neuro: Alert and oriented X 3. Moves all extremities spontaneously. Grossly normal. HEENT: [place]/AT, EOMI, MMM Neck: Supple without bruits or JVD.  Lungs: CPAP, nonlabored, good air movement. No W./R./R. Except for mild extremity wheezing in the bases. Heart: RRR no s3, s4, or murmurs.  Abdomen: Soft, non-tender, non-distended, BS + x 4.  Extremities: No C/C/E. DP/PT/Radials 2+ and equal bilaterally.Cranial nerves: normal (II-XII grossly intact)    Adult ECG Report  Rate: 64 ;  Rhythm: normal sinus rhythm and Borderline voltage for LVH. Periods possible inferior infarct age undetermined. This is probably unlikely..  Narrative Interpretation: stable EKG  Recent Labs:     Chemistry      Component Value Date/Time   NA 137 04/30/2015 1150   K 3.5 04/30/2015 1150   CL 107 04/30/2015 1150   CO2 24 04/30/2015 1150   BUN 15 04/30/2015 1150   CREATININE 0.80 04/30/2015 1150      Component Value Date/Time   CALCIUM 8.5* 04/30/2015 1150   ALKPHOS 44 04/16/2015 1050   AST 20 04/16/2015 1050   ALT 14  04/16/2015 1050   BILITOT 0.6 04/16/2015 1050      ASSESSMENT / PLAN: Problem List Items Addressed This Visit    Chest pain of uncertain etiology (Chronic)    He has had intermittent spells of chest pain, oftentimes in stressful situations. He has had cardiac catheterizations at least 2 times the past with a negative stress test as well. Finally the disease atypical episodes are more in nature. Probably more related to anxiety or even potentially palpitations.  I think avoidance techniques for the past option here as opposed Medicaid and none were treating. Biofeedback and avoidance techniques were discussed.       Near syncope    He is more marked currently never truly found anything significant.he had a couple short runs of PSVT. I would think this episode sound like that because he did not describe palpitations until after he fell near syncopal. My suspicion is that he maybe had some type of a vagal episode.  What I try to do as redirection to figure out ways to avoid these episodes including adequate hydration of 10 8 ounce cups of water a day and avoiding caffeine. She grew beverages do not. He needs to understand that when he urinates it is clear. He also essentially eats a stable diet. He needs to avoid stressful episodes situations by trying to do biofeedback techniques to calm himself down. He does have metoprolol which he can take when necessary for rapid palpitations. We talked about timing of when he should try that.      PSVT (paroxysmal supraventricular tachycardia) - Primary (Chronic)    He had short bursts of PSVT on his monitor strip there were very short lasting. Neither one was longer to make you feel near syncopal. Certainly he has the potential for them to happen. Therefore he is on beta blocker. We discussed Vagal maneuvers again and the when necessary use of additional beta blocker.      Relevant Orders   EKG 12-Lead (Completed)     Patient Instructions: DRINK 8  TO 10 OZ CUP  WATER DAILY -[place] ENOUGH EVERY DAY UNTIL URINE IS CLEAR.  IF YOU DRINK JUICE AND OR COFFEE  (THAT DOES NOT COUNT IN THE TOTAL FLUID ) STILL NEED TO DRINK GLASS/CUP WATER.  WHEN FEELING FAINT SIT AND DRINK WATER AND EAT SOME CRACKERS AND RELAX.   Orders Placed This Encounter  Procedures  . EKG 12-Lead   Meds ordered this encounter  Medications  . esomeprazole (NEXIUM) 40 MG capsule    Sig: Take 1 capsule by mouth daily as needed.   Very difficult interview to conduct. Using sign language interpreter. The patient is very hard to keep on target answering questions.  Followup: 3-4 months    HARDING, Leonie Green, M.D., M.S. Interventional Cardiologist   Pager # [phone]

## 2015-05-04 NOTE — Patient Instructions (Addendum)
DRINK 8 TO 10 OZ CUP  WATER DAILY -[place] ENOUGH EVERY DAY UNTIL URINE IS CLEAR.  IF YOU DRINK JUICE AND OR COFFEE  (THAT DOES NOT COUNT IN THE TOTAL FLUID ) STILL NEED TO DRINK GLASS/CUP WATER.   WHEN FEELING FAINT SIT AND DRINK WATER AND EAT SOME CRACKERS AND RELAX.    Your physician wants you to follow-up in 3-4 MONTHS  DR HARDING ----   30 MIN APPOINTMENT.  You will receive a reminder letter in the mail two months in advance. If you don't receive a letter, please call our office to schedule the follow-up appointment.

## 2015-05-11 ENCOUNTER — Encounter: Payer: Self-pay | Admitting: Cardiology

## 2015-05-11 DIAGNOSIS — R0789 Other chest pain: Secondary | ICD-10-CM

## 2015-05-11 DIAGNOSIS — R55 Syncope and collapse: Secondary | ICD-10-CM | POA: Insufficient documentation

## 2015-05-11 DIAGNOSIS — R079 Chest pain, unspecified: Secondary | ICD-10-CM | POA: Insufficient documentation

## 2015-05-11 NOTE — Assessment & Plan Note (Signed)
He had short bursts of PSVT on his monitor strip there were very short lasting. Neither one was longer to make you feel near syncopal. Certainly he has the potential for them to happen. Therefore he is on beta blocker. We discussed Vagal maneuvers again and the when necessary use of additional beta blocker.

## 2015-05-11 NOTE — Assessment & Plan Note (Addendum)
He is more marked currently never truly found anything significant.he had a couple short runs of PSVT. I would think this episode sound like that because he did not describe palpitations until after he fell near syncopal. My suspicion is that he maybe had some type of a vagal episode.  What I try to do as redirection to figure out ways to avoid these episodes including adequate hydration of 10 8 ounce cups of water a day and avoiding caffeine. She grew beverages do not. He needs to understand that when he urinates it is clear. He also essentially eats a stable diet. He needs to avoid stressful episodes situations by trying to do biofeedback techniques to calm himself down. He does have metoprolol which he can take when necessary for rapid palpitations. We talked about timing of when he should try that.

## 2015-05-11 NOTE — Assessment & Plan Note (Addendum)
He has had intermittent spells of chest pain, oftentimes in stressful situations. He has had cardiac catheterizations at least 2 times the past with a negative stress test as well. Finally the disease atypical episodes are more in nature. Probably more related to anxiety or even potentially palpitations.  I think avoidance techniques for the past option here as opposed Medicaid and none were treating. Biofeedback and avoidance techniques were discussed.

## 2015-08-17 ENCOUNTER — Encounter: Payer: Self-pay | Admitting: *Deleted

## 2015-08-23 ENCOUNTER — Ambulatory Visit (INDEPENDENT_AMBULATORY_CARE_PROVIDER_SITE_OTHER): Payer: Medicare Other | Admitting: Cardiology

## 2015-08-23 ENCOUNTER — Ambulatory Visit: Payer: Self-pay | Admitting: Cardiology

## 2015-08-23 VITALS — BP 118/92 | HR 63 | Ht 71.0 in | Wt 166.9 lb

## 2015-08-23 DIAGNOSIS — R002 Palpitations: Secondary | ICD-10-CM

## 2015-08-23 DIAGNOSIS — I493 Ventricular premature depolarization: Secondary | ICD-10-CM | POA: Diagnosis not present

## 2015-08-23 DIAGNOSIS — R55 Syncope and collapse: Secondary | ICD-10-CM

## 2015-08-23 DIAGNOSIS — R0789 Other chest pain: Secondary | ICD-10-CM | POA: Diagnosis not present

## 2015-08-23 DIAGNOSIS — I471 Supraventricular tachycardia: Secondary | ICD-10-CM

## 2015-08-23 DIAGNOSIS — R079 Chest pain, unspecified: Secondary | ICD-10-CM

## 2015-08-23 MED ORDER — FLECAINIDE ACETATE 50 MG PO TABS: 50.0000 mg | ORAL_TABLET | Freq: Two times a day (BID) | ORAL | Status: DC

## 2015-08-23 NOTE — Patient Instructions (Signed)
MAY USE EXTRA DOSE OF YOUR METOPROLOL TABLET IF YOU HAVE A RAPID HEART IF NEEDED.Marland Kitchen  STARTING A NEW MEDICATIONS FLECAINIDE 50 MG   FOR THE FIRST WEEK TAKE 1/2 TABLET TWICE A DAY  AND THEN TAKE 1 TABLET TWICE A DAY STARTING THE SECOND WEEK AND CONTINUE.  NEED LABS DONE NEXT WEEK - CBC,CMP,MAGNESIUM LEVEL-- GO ANYTIME OF THE DAY.   YOU MAY USE VAGAL MANEUVERS IF YOU HAVE RAPID HEART RATE--- LIKE  "BEARING DOWN", DRINKING COLD WATER, COUGHING  Your physician recommends that you schedule a follow-up appointment in [place] 2016 WITH DR Lennox - 30 MIN [place].

## 2015-08-23 NOTE — Progress Notes (Signed)
PCP: Pcp Not In System  Clinic Note: Chief Complaint  Patient presents with  . Palpitations    pt c/o on and off chest pain and fluttering/racing. pt denied swelling in feet and ankles    HPI: Nathaniel [name] is a 54 y.o. male with a PMH below who presents today for Hospital followup for episode of near syncope/palpitations.   He has had a complete evaluation for palpitations and chest pain and discomfort in various potential vascular episodes. He hass had nuclear stress tests, echocardiogram, a vascular ultrasound all of which have been unrevealing. Event Monitor in May 2015 did show 2 short runs of PAT/PSVT.  He has had intermittent "pass-out spells"  He was last seen in May 2015 in follow-up for palpitations & syncope.  Syncope thought to be related to dehydration & heat exposure -- vasovagal.   Rec: avoid caffeine, adequate hydration & adequate nutrition. PRN - then standing dose of BB with PRN doses.  Also discussed Vagal Maneuvers - he does not recal.  Interval History:   As usual, [name] no cerumen episodes of chest pain, but the main thing he knows is that he has frequent episodes of palpitations or fluttering in his chest that are rapid in nature. Despite having cut back on his caffeine, he still has episodes that over the last month or so gotten worse. Episodes can last anywhere from minutes 2 minutes and are associated with a little dizziness but he has not had syncope associated with any of these spells. He has had an episode about 2 weeks ago where he says he passed out after a full day at work. He felt a little dizzy while driving home, but did okay. Was not noticing any palpitations. When he had apparently led to light down and does not recall about 30 minutes worth of time. He says he must have passed out, but says it was for 30 minutes which is an unusual amount of time. No sensation of palpitations. He has had intermittent episodes of chest discomfort probably associated  with palpitations and not otherwise. For the most part no significant chest pain episodes as he has had in the past. No TIA or CVA symptoms. No heart failure symptoms of PND, orthopnea or edema. No chest tightness or pressure with rest or exertion.   Past Medical History  Diagnosis Date  . Deaf     Call [phone] for patient's sign language interpreter.   . Barrett's esophagus 10-25-14 pt denies    states "minor"  . Asthma     prn inhaler  . TIA (transient ischemic attack) 10-25-14 pt denies    no current deficits  . Seasonal allergies   . High cholesterol   . Heartburn     occasional, related to certain foods  . Arthritis     right knee  . 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 Intermittent palpitations 10-25-14 patient denies    Event Monitor 04/2014: Mostly NSR - intermittent PACs, - 2 brief runs of PAT/PSVT  . Vasovagal syncope     Prior Cardiac Evaluation and Past Surgical History: Past Surgical History  Procedure Laterality Date  . Bunionectomy Left 2012    right done also  . Knee arthroscopy Left 06/02/2012  . Knee arthroscopy Right fall 2013    x 2  . Inguinal hernia repair    . Cardiac catheterization  06/24/2011    wwidely patent normal coronary arteries with normal ejection fraction  . Knee arthroscopy Left 03/03/2013  Procedure: ARTHROSCOPY KNEE WITH CHONDROPLASTY PATELLA  AND LATERAL TIBIAL PLATEAU;  Surgeon: Kerin Salen, MD;  Location: [facility];  Service: Orthopedics;  Laterality: Left;  . Foot implant removal Right 07/15/2013    @ [place]  . Nm myoview ltd  2012    small fixed basal inferior artifact. Normal EF.  . Carotid artery dopplers  2012    tortuous but no stenoses.. No subclavian disease  . Upper and lower extremity arterial dopplers  2012    normal arterial flow bilateral upper extremities including subclavian is a carotid;;R. ABI 1.2, L. ABI 1.28. Normal flow velocities. Likely calcified vessels.  . Abdominal aortic ultrasound  2012      normal abdominal aorta  . Transthoracic echocardiogram  June2015    nnormal EF: 55-60%. No wall motion abnormalities. Gr 1 DD.  Mild MR. Otherwise normal  . Colonoscopy with propofol N/A 11/03/2014    Procedure: COLONOSCOPY WITH PROPOFOL;  Surgeon: Milus Banister, MD;  Location: [facility];  Service: Endoscopy;  Laterality: N/A;  .  ROS: A comprehensive was performed. Review of Systems  Constitutional: Negative for malaise/fatigue.  HENT: Negative for nosebleeds.   Respiratory: Negative for cough and shortness of breath.        Having some allergy symptoms  Cardiovascular: Negative for claudication and leg swelling.  Gastrointestinal: Positive for nausea (Associated with his episodes). Negative for diarrhea, constipation, blood in stool and melena.  Genitourinary: Negative for hematuria.  Musculoskeletal: Negative for falls.  Neurological: Positive for dizziness and loss of consciousness (By his report as noted above - one episode roughly 2 weeks ago).  Psychiatric/Behavioral: The patient is nervous/anxious.   All other systems reviewed and are negative.   Current Outpatient Prescriptions on File Prior to Visit  Medication Sig Dispense Refill  . albuterol (PROVENTIL HFA;VENTOLIN HFA) 108 (90 BASE) MCG/ACT inhaler Inhale 1-2 puffs into the lungs every 6 (six) hours as needed for wheezing or shortness of breath. 1 Inhaler 0  . albuterol (PROVENTIL) (2.5 MG/3ML) 0.083% nebulizer solution Take 3 mLs (2.5 mg total) by nebulization every 6 (six) hours as needed for wheezing. 75 mL 12  . albuterol (PROVENTIL) (2.5 MG/3ML) 0.083% nebulizer solution Take 3 mLs (2.5 mg total) by nebulization every 4 (four) hours as needed for wheezing or shortness of breath. 30 vial 0  . aspirin EC 325 MG tablet Take 325 mg by mouth daily.    Marland Kitchen esomeprazole (NEXIUM) 40 MG capsule Take 1 capsule by mouth daily as needed.    . fluticasone (FLONASE) 50 MCG/ACT nasal spray Place 1 spray into both nostrils daily.     . Fluticasone-Salmeterol (ADVAIR) 250-50 MCG/DOSE AEPB Inhale 1 puff into the lungs daily.     . metoprolol tartrate (LOPRESSOR) 25 MG tablet Take 1 tablet (25 mg total) by mouth 2 (two) times daily. 180 tablet 1  . mometasone-formoterol (DULERA) 100-5 MCG/ACT AERO Inhale 1 puff into the lungs daily.     No current facility-administered medications on file prior to visit.   Allergies  Allergen Reactions  . Simvastatin Other (See Comments)    DEPRESSION, VISUAL DISTURBANCE(FLASHING LIGHTS)  . Fish Allergy Rash    SOFTSHELL FISH    Social History  Substance Use Topics  . Smoking status: Never Smoker   . Smokeless tobacco: Never Used  . Alcohol Use: No   Family History  Problem Relation Age of Onset  . Ovarian cancer Mother   . Coronary artery disease Father  Wt Readings from Last 3 Encounters:  08/23/15 166 lb 14.4 oz (75.705 kg)  05/04/15 169 lb 12.8 oz (77.021 kg)  04/30/15 168 lb (76.204 kg)    PHYSICAL EXAM BP 118/92 mmHg  Pulse 63  Ht 5\' 11"  (1.803 m)  Wt 166 lb 14.4 oz (75.705 kg)  BMI 23.29 kg/m2 General: Pleasant, NAD. Speaks using sign language ; healthy appearing, but mildly disheveled. Psych: Normal affect. Easily excited Neuro: Alert and oriented X 3. Moves all extremities spontaneously. Grossly normal. HEENT: [place]/AT, EOMI, MMM Neck: Supple without bruits or JVD.  Lungs: CPAP, nonlabored, good air movement. No W./R./R. Except for mild extremity wheezing in the bases. Heart: RRR no s3, s4, or murmurs.  Abdomen: Soft, non-tender, non-distended, BS + x 4.  Extremities: No C/C/E. DP/PT/Radials 2+ and equal bilaterally.Cranial nerves: normal (II-XII grossly intact)   Adult ECG Report  Rate: 63 ;  Rhythm: normal sinus rhythm and Borderline voltage for LVH.  Narrative Interpretation: stable EKG  Recent Labs:     Chemistry      Component Value Date/Time   NA 137 04/30/2015 1150   K 3.5 04/30/2015 1150   CL 107 04/30/2015 1150   CO2 24 04/30/2015  1150   BUN 15 04/30/2015 1150   CREATININE 0.80 04/30/2015 1150      Component Value Date/Time   CALCIUM 8.5* 04/30/2015 1150   ALKPHOS 44 04/16/2015 1050   AST 20 04/16/2015 1050   ALT 14 04/16/2015 1050   BILITOT 0.6 04/16/2015 1050      ASSESSMENT / PLAN: Problem List Items Addressed This Visit    Chest pain of uncertain etiology - Primary (Chronic)    Most likely musculoskeletal or stress related symptoms. Not anginal. Very atypical symptoms for angina and has had a negative evaluation. Maybe associated with palpitations which will be treated as discussed.      Relevant Orders   EKG 12-Lead   CBC   Magnesium   Comprehensive metabolic panel   Palpitations   PSVT (paroxysmal supraventricular tachycardia) (Chronic)    He is having a lot more episodes of palpitations. He does seem to be hypersensitive to anything abnormal, but these episodes do seem to be more concerning for him. We talked about again using when necessary doses of additional metoprolol, but I think for now we will add an additional antiarrhythmic agent in the form of low-dose flecainide that being of PSVT prevention. I again discussed vagal maneuvers he did not recall. We went through them in detail with the interpreter.  We'll check CBC and CMP plus magnesium to ensure stability on flecainide.      Relevant Medications   flecainide (TAMBOCOR) 50 MG tablet   Other Relevant Orders   EKG 12-Lead   CBC   Magnesium   Comprehensive metabolic panel   PVC (premature ventricular contraction) (Chronic)    Chronic PACs and PVCs. On standing beta blocker. With concern for possible short burst of PSVT, will start flecainide. He had an episode that sounded like syncope, not associated PSVT type symptoms. We may consider referral for EP evaluation if these things continue.      Relevant Medications   flecainide (TAMBOCOR) 50 MG tablet   Other Relevant Orders   EKG 12-Lead   CBC   Magnesium   Comprehensive  metabolic panel   Syncope    Not really sure to make of this episode 10 days ago. Very unlikely that he would have "passed out" for 30 minutes. It is concerning however  since he is having these palpitation episodes. Will give a trial run of tonight and reassess in about 4 months. If these symptoms continue, low threshold for referral to BP.      Relevant Medications   flecainide (TAMBOCOR) 50 MG tablet     Patient Instructions: DRINK 8 TO 10 OZ CUP  WATER DAILY -[place] ENOUGH EVERY DAY UNTIL URINE IS CLEAR.  IF YOU DRINK JUICE AND OR COFFEE  (THAT DOES NOT COUNT IN THE TOTAL FLUID ) STILL NEED TO DRINK GLASS/CUP WATER.   WHEN FEELING FAINT SIT AND DRINK WATER AND EAT SOME CRACKERS AND RELAX.  MAY USE EXTRA DOSE OF YOUR METOPROLOL TABLET IF YOU HAVE A RAPID HEART IF NEEDED.Marland Kitchen  STARTING A NEW MEDICATIONS FLECAINIDE 50 MG   FOR THE FIRST WEEK TAKE 1/2 TABLET TWICE A DAY  AND THEN TAKE 1 TABLET TWICE A DAY STARTING THE SECOND WEEK AND CONTINUE.  NEED LABS DONE NEXT WEEK - CBC,CMP,MAGNESIUM LEVEL-- GO ANYTIME OF THE DAY.   YOU MAY USE VAGAL MANEUVERS IF YOU HAVE RAPID HEART RATE--- LIKE  "BEARING DOWN", DRINKING COLD WATER, COUGHING  Your physician recommends that you schedule a follow-up appointment in [place] 2016 WITH DR Duncan - 30 MIN [place].   Orders Placed This Encounter  Procedures  . CBC  . Magnesium  . Comprehensive metabolic panel  . EKG 12-Lead   Meds ordered this encounter  Medications  . flecainide (TAMBOCOR) 50 MG tablet    Sig: Take 1 tablet (50 mg total) by mouth 2 (two) times daily.    Dispense:  60 tablet    Refill:  6   Very difficult interview to conduct. Using sign language interpreter. The patient is very hard to keep on target answering questions. At least 40 minutes of patient interaction - obtaining history & explaining plan of care as well as pathophysiology.   Leonie Man, M.D., M.S. Interventional Cardiologist   Pager #  [phone]

## 2015-08-25 ENCOUNTER — Encounter: Payer: Self-pay | Admitting: Cardiology

## 2015-08-25 NOTE — Assessment & Plan Note (Addendum)
He is having a lot more episodes of palpitations. He does seem to be hypersensitive to anything abnormal, but these episodes do seem to be more concerning for him. We talked about again using when necessary doses of additional metoprolol, but I think for now we will add an additional antiarrhythmic agent in the form of low-dose flecainide that being of PSVT prevention. I again discussed vagal maneuvers he did not recall. We went through them in detail with the interpreter.  We'll check CBC and CMP plus magnesium to ensure stability on flecainide.

## 2015-08-25 NOTE — Assessment & Plan Note (Signed)
Not really sure to make of this episode 10 days ago. Very unlikely that he would have "passed out" for 30 minutes. It is concerning however since he is having these palpitation episodes. Will give a trial run of tonight and reassess in about 4 months. If these symptoms continue, low threshold for referral to BP.

## 2015-08-25 NOTE — Assessment & Plan Note (Addendum)
Chronic PACs and PVCs. On standing beta blocker. With concern for possible short burst of PSVT, will start flecainide. He had an episode that sounded like syncope, not associated PSVT type symptoms. We may consider referral for EP evaluation if these things continue.

## 2015-08-25 NOTE — Assessment & Plan Note (Signed)
Most likely musculoskeletal or stress related symptoms. Not anginal. Very atypical symptoms for angina and has had a negative evaluation. Maybe associated with palpitations which will be treated as discussed.

## 2015-08-31 HISTORY — PX: OTHER SURGICAL HISTORY: SHX169

## 2015-09-03 ENCOUNTER — Emergency Department (HOSPITAL_BASED_OUTPATIENT_CLINIC_OR_DEPARTMENT_OTHER): Payer: Medicare Other

## 2015-09-03 ENCOUNTER — Encounter (HOSPITAL_BASED_OUTPATIENT_CLINIC_OR_DEPARTMENT_OTHER): Payer: Self-pay | Admitting: Emergency Medicine

## 2015-09-03 ENCOUNTER — Emergency Department (HOSPITAL_BASED_OUTPATIENT_CLINIC_OR_DEPARTMENT_OTHER)
Admission: EM | Admit: 2015-09-03 | Discharge: 2015-09-03 | Disposition: A | Discharge status: Home / Self Care | Payer: Medicare Other | Attending: Emergency Medicine | Admitting: Emergency Medicine

## 2015-09-03 DIAGNOSIS — Z8719 Personal history of other diseases of the digestive system: Secondary | ICD-10-CM | POA: Insufficient documentation

## 2015-09-03 DIAGNOSIS — Z7982 Long term (current) use of aspirin: Secondary | ICD-10-CM | POA: Diagnosis not present

## 2015-09-03 DIAGNOSIS — H9190 Unspecified hearing loss, unspecified ear: Secondary | ICD-10-CM | POA: Diagnosis not present

## 2015-09-03 DIAGNOSIS — J45909 Unspecified asthma, uncomplicated: Secondary | ICD-10-CM | POA: Diagnosis not present

## 2015-09-03 DIAGNOSIS — Z79899 Other long term (current) drug therapy: Secondary | ICD-10-CM | POA: Insufficient documentation

## 2015-09-03 DIAGNOSIS — Z8639 Personal history of other endocrine, nutritional and metabolic disease: Secondary | ICD-10-CM | POA: Insufficient documentation

## 2015-09-03 DIAGNOSIS — Z8673 Personal history of transient ischemic attack (TIA), and cerebral infarction without residual deficits: Secondary | ICD-10-CM | POA: Diagnosis not present

## 2015-09-03 DIAGNOSIS — M1990 Unspecified osteoarthritis, unspecified site: Secondary | ICD-10-CM | POA: Diagnosis not present

## 2015-09-03 DIAGNOSIS — Z7951 Long term (current) use of inhaled steroids: Secondary | ICD-10-CM | POA: Insufficient documentation

## 2015-09-03 DIAGNOSIS — R002 Palpitations: Secondary | ICD-10-CM | POA: Diagnosis not present

## 2015-09-03 DIAGNOSIS — R000 Tachycardia, unspecified: Secondary | ICD-10-CM | POA: Diagnosis present

## 2015-09-03 LAB — CBC
HCT: 40.9 % (ref 39.0–52.0)
HEMOGLOBIN: 13.7 g/dL (ref 13.0–17.0)
MCH: 30.4 pg (ref 26.0–34.0)
MCHC: 33.5 g/dL (ref 30.0–36.0)
MCV: 90.9 fL (ref 78.0–100.0)
Platelets: 160 10*3/uL (ref 150–400)
RBC: 4.5 MIL/uL (ref 4.22–5.81)
RDW: 13 % (ref 11.5–15.5)
WBC: 5 10*3/uL (ref 4.0–10.5)

## 2015-09-03 LAB — TROPONIN I: Troponin I: <0.03 ng/mL (ref <0.031)

## 2015-09-03 LAB — BASIC METABOLIC PANEL
ANION GAP: 7 (ref 5–15)
BUN: 14 mg/dL (ref 6–20)
CO2: 25 mmol/L (ref 22–32)
Calcium: 8.9 mg/dL (ref 8.9–10.3)
Chloride: 107 mmol/L (ref 101–111)
Creatinine, Ser: 0.82 mg/dL (ref 0.61–1.24)
GFR calc non Af Amer: >60 mL/min (ref >60)
Glucose, Bld: 113 mg/dL — ABNORMAL HIGH (ref 65–99)
Potassium: 3.8 mmol/L (ref 3.5–5.1)
Sodium: 139 mmol/L (ref 135–145)

## 2015-09-03 MED ORDER — METOPROLOL TARTRATE 50 MG PO TABS
25.0000 mg | ORAL_TABLET | Freq: Once | ORAL | Status: AC
  Administered 2015-09-03: 25 mg via ORAL
  Filled 2015-09-03: qty 1

## 2015-09-03 NOTE — Discharge Instructions (Signed)
Supraventricular Tachycardia °Supraventricular tachycardia (SVT) is an abnormal heart rhythm (arrhythmia) that causes the heart to beat very fast (tachycardia). This kind of fast heartbeat originates in the upper chambers of the heart (atria). SVT can cause the heart to beat greater than 100 beats per minute. SVT can have a rapid burst of heartbeats. This can start and stop suddenly without warning and is called nonsustained. SVT can also be sustained, in which the heart beats at a continuous fast rate.  °CAUSES  °There can be different causes of SVT. Some of these include: °· Heart valve problems such as mitral valve prolapse. °· An enlarged heart (hypertrophic cardiomyopathy). °· Congenital heart problems. °· Heart inflammation (pericarditis). °· Hyperthyroidism. °· Low potassium or magnesium levels. °· Caffeine. °· Drug use such as cocaine, methamphetamines, or stimulants. °· Some over-the-counter medicines such as: °¨ Decongestants. °¨ Diet medicines. °¨ Herbal medicines. °SYMPTOMS  °Symptoms of SVT can vary. Symptoms depend on whether the SVT is sustained or nonsustained. You may experience: °· No symptoms (asymptomatic). °· An awareness of your heart beating rapidly (palpitations). °· Shortness of breath. °· Chest pain or pressure. °If your blood pressure drops because of the SVT, you may experience: °· Fainting or near fainting. °· Weakness. °· Dizziness. °DIAGNOSIS  °Different tests can be performed to diagnose SVT, such as: °· An electrocardiogram (EKG). This is a painless test that records the electrical activity of your heart. °· Holter monitor. This is a 24 hour recording of your heart rhythm. You will be given a diary. Write down all symptoms that you have and what you were doing at the time you experienced symptoms. °· Arrhythmia monitor. This is a small device that your wear for several weeks. It records the heart rhythm when you have symptoms. °· Echocardiogram. This is an imaging test to help detect  abnormal heart structure such as congenital abnormalities, heart valve problems, or heart enlargement. °· Stress test. This test can help determine if the SVT is related to exercise. °· Electrophysiology study (EPS). This is a procedure that evaluates your heart's electrical system and can help your caregiver find the cause of your SVT. °TREATMENT  °Treatment of SVT depends on the symptoms, how often it recurs, and whether there are any underlying heart problems.  °· If symptoms are rare and no other cardiac disease is present, no treatment may be needed. °· Blood work may be done to check potassium, magnesium, and thyroid hormone levels to see if they are abnormal. If these levels are abnormal, treatment to correct the problems will occur. °Medicines °Your caregiver may use oral medicines to treat SVT. These medicines are given for long-term control of SVT. Medicines may be used alone or in combination with other treatments. These medicines work to slow nerve impulses in the heart muscle. These medicines can also be used to treat high blood pressure. Some of these medicines may include: °· Calcium channel blockers. °· Beta blockers. °· Digoxin. °Nonsurgical procedures °Nonsurgical techniques may be used if oral medicines do not work. Some examples include: °· Cardioversion. This technique uses either drugs or an electrical shock to restore a normal heart rhythm. °¨ Cardioversion drugs may be given through an intravenous (IV) line to help "reset" the heart rhythm. °¨ In electrical cardioversion, the caregiver shocks your heart to stop its beat for a split second. This helps to reset the heart to a normal rhythm. °· Ablation. This procedure is done under mild sedation. High frequency radio wave energy is used to   destroy the area of heart tissue responsible for the SVT. °HOME CARE INSTRUCTIONS  °· Do not smoke. °· Only take medicines prescribed by your caregiver. Check with your caregiver before using over-the-counter  medicines. °· Check with your caregiver about how much alcohol and caffeine (coffee, tea, colas, or chocolate) you may have. °· It is very important to keep all follow-up referrals and appointments in order to properly manage this problem. °SEEK IMMEDIATE MEDICAL CARE IF: °· You have dizziness. °· You faint or nearly faint. °· You have shortness of breath. °· You have chest pain or pressure. °· You have sudden nausea or vomiting. °· You have profuse sweating. °· You are concerned about how long your symptoms last. °· You are concerned about the frequency of your SVT episodes. °If you have the above symptoms, call your local emergency services (911 in [place] immediately. Do not drive yourself to the hospital. °MAKE SURE YOU:  °· Understand these instructions. °· Will watch your condition. °· Will get help right away if you are not doing well or get worse. °Document Released: 12/16/2005 Document Revised: 03/09/2012 Document Reviewed: 03/30/2009 °[employer]® Patient Information ©2015 [employer]. This information is not intended to replace advice given to you by your health care provider. Make sure you discuss any questions you have with your health care provider. ° °

## 2015-09-03 NOTE — ED Notes (Signed)
Using written communication board and reading lips - patient seen Dr.Harding on 08/23/15 and was taken off Lopressor and placed on new beta blocker. Only report c/o dizziness with no SOB or chest pain. Reports no fever, chills, or body aches and no N/V/D. Discussed with Dr. Tamera Punt  Information obtained.

## 2015-09-03 NOTE — ED Notes (Signed)
Pt reports onset of tachycardia x 1 hour ago, took tambacor am medicine and is no longer tachycardia

## 2015-09-03 NOTE — ED Provider Notes (Signed)
CSN: [id number]     Arrival date & time 09/03/15  1111 History   First MD Initiated Contact with Patient 09/03/15 1123     Chief Complaint  Patient presents with  . 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 He was previously on metoprolol 25mg  BID and was recently started on flecainide 50mg  BID two weeks ago by his cardiologist. The palpitations subsided spontaneously and he feels better now. No caffeine use but he has been under some stress. He denied any chest pain or syncope.  Past Medical History  Diagnosis Date  . Deaf     Call [phone] for patient's sign language interpreter.   . Barrett's esophagus 10-25-14 pt denies    states "minor"  . Asthma     prn inhaler  . TIA (transient ischemic attack) 10-25-14 pt denies    no current deficits  . Seasonal allergies   . High cholesterol   . Heartburn     occasional, related to certain foods  . Arthritis     right knee  . 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 Intermittent palpitations 10-25-14 patient denies    Event Monitor 04/2014: Mostly NSR - intermittent PACs, - 2 brief runs of PAT/PSVT  . Vasovagal syncope   . Tachycardia    Past Surgical History  Procedure Laterality Date  . Bunionectomy Left 2012    right done also  . Knee arthroscopy Left 06/02/2012  . Knee arthroscopy Right fall 2013    x 2  . Inguinal hernia repair    . Cardiac catheterization  06/24/2011    wwidely patent normal coronary arteries with normal ejection fraction  . Knee arthroscopy Left 03/03/2013    Procedure: ARTHROSCOPY KNEE WITH CHONDROPLASTY PATELLA  AND LATERAL TIBIAL PLATEAU;  Surgeon: Kerin Salen, MD;  Location: [place];  Service: Orthopedics;  Laterality: Left;  . Foot implant removal Right 07/15/2013    @ [place]  . Nm myoview ltd  2012   small fixed basal inferior artifact. Normal EF.  . Carotid artery dopplers  2012    tortuous but no stenoses.. No subclavian disease  . Upper and lower extremity arterial dopplers  2012    normal arterial flow bilateral upper extremities including subclavian is a carotid;;R. ABI 1.2, L. ABI 1.28. Normal flow velocities. Likely calcified vessels.  . Abdominal aortic ultrasound  2012    normal abdominal aorta  . Transthoracic echocardiogram  June2015    nnormal EF: 55-60%. No wall motion abnormalities. Gr 1 DD.  Mild MR. Otherwise normal  . Colonoscopy with propofol N/A 11/03/2014    Procedure: COLONOSCOPY WITH PROPOFOL;  Surgeon: Milus Banister, MD;  Location: [facility];  Service: Endoscopy;  Laterality: N/A;   Family History  Problem Relation Age of Onset  . Ovarian cancer Mother   . Coronary artery disease Father    Social History  Substance Use Topics  . Smoking status: Never Smoker   . Smokeless tobacco: Never Used  . Alcohol Use: No    Review of Systems  Constitutional: Negative for fever.  Respiratory: Negative for chest tightness and shortness of breath.   Cardiovascular: Positive for palpitations. Negative for chest pain and leg swelling.  Gastrointestinal: Negative for abdominal pain.  Neurological: Positive for light-headedness. Negative for dizziness, syncope and weakness.  Allergies  Simvastatin and Fish allergy  Home Medications   Prior to Admission medications   Medication Sig Start Date End Date Taking? Authorizing Provider  aspirin EC 325 MG tablet Take 325 mg by mouth daily.   Yes Historical Provider, MD  esomeprazole (NEXIUM) 40 MG capsule Take 1 capsule by mouth daily as needed. 03/22/15  Yes Historical Provider, MD  flecainide (TAMBOCOR) 50 MG tablet Take 1 tablet (50 mg total) by mouth 2 (two) times daily. 08/23/15  Yes Leonie Man, MD  fluticasone [facility]) 50 MCG/ACT nasal spray Place 1 spray into both nostrils daily. 02/04/14  Yes Historical  Provider, MD  albuterol (PROVENTIL HFA;VENTOLIN HFA) 108 (90 BASE) MCG/ACT inhaler Inhale 1-2 puffs into the lungs every 6 (six) hours as needed for wheezing or shortness of breath. 04/16/15   Pamella Pert, MD  albuterol (PROVENTIL) (2.5 MG/3ML) 0.083% nebulizer solution Take 3 mLs (2.5 mg total) by nebulization every 6 (six) hours as needed for wheezing. 08/24/13   Noemi Chapel, MD  albuterol (PROVENTIL) (2.5 MG/3ML) 0.083% nebulizer solution Take 3 mLs (2.5 mg total) by nebulization every 4 (four) hours as needed for wheezing or shortness of breath. 04/16/15   Pamella Pert, MD  Fluticasone-Salmeterol (ADVAIR) 250-50 MCG/DOSE AEPB Inhale 1 puff into the lungs daily.     Historical Provider, MD  mometasone-formoterol (DULERA) 100-5 MCG/ACT AERO Inhale 1 puff into the lungs daily.    Historical Provider, MD   BP 123/76 mmHg  Pulse 76  Temp(Src) 97.7 F (36.5 C) (Oral)  Resp 20  SpO2 98%   Physical Exam  Constitutional: He appears well-developed and well-nourished.  HENT:  Head: Normocephalic and atraumatic.  Eyes: Conjunctivae and EOM are normal.  Neck: Normal range of motion. Neck supple.  Cardiovascular: Normal rate, regular rhythm, normal heart sounds and intact distal pulses.   Pulmonary/Chest: Effort normal and breath sounds normal.  Abdominal: Soft. Bowel sounds are normal.  Skin: Skin is warm and dry.  Psychiatric: He has a normal mood and affect. His behavior is normal.    ED Course  Procedures (including critical care time)  Labs Review Labs Reviewed - No data to display  Imaging Review No results found. I have personally reviewed and evaluated these images and lab results as part of my medical decision-making.  EKG: Rate 70, normal sinus rhythm, no ST changes  MDM   Final diagnoses:  Palpitations    Very friendly 54 year old deaf man with known history of PSVT presents with an episode of palpitations and light headedness that spontaneously resolved earlier  this morning after he ran out of his metoprolol yesterday. His cardiologist started him on flecainide two weeks ago; he says this has been helping his palpitations. His EKG in the ED was normal sinus rhythm and he was asymptomatic when I saw him. I checked and there are no drug interactions with his flecainide nor metoprolol. We gave him a dose of metoprolol in the ED and he said he would get this prescription refilled tomorrow morning when the pharmacy opens. He has an appointment with Dr. Ellyn Hack in December.    Loleta Chance, MD 09/03/15 [place], MD 09/03/15 1236

## 2015-09-11 ENCOUNTER — Encounter: Payer: Self-pay | Admitting: Cardiology

## 2015-09-12 ENCOUNTER — Encounter (INDEPENDENT_AMBULATORY_CARE_PROVIDER_SITE_OTHER): Payer: Medicare Other

## 2015-09-12 ENCOUNTER — Other Ambulatory Visit: Payer: Self-pay | Admitting: *Deleted

## 2015-09-12 ENCOUNTER — Telehealth: Payer: Self-pay | Admitting: *Deleted

## 2015-09-12 DIAGNOSIS — R002 Palpitations: Secondary | ICD-10-CM

## 2015-09-12 NOTE — Telephone Encounter (Signed)
Patient walked in ,he thought he had an appointment. scheduler spoke to Dr Ellyn Hack Event ordered 30 day

## 2015-09-12 NOTE — Patient Instructions (Signed)
Cardiac Event Monitoring A cardiac event monitor is a small recording device used to help detect abnormal heart rhythms (arrhythmias). The monitor is used to record heart rhythm when noticeable symptoms such as the following occur:  Fast heartbeats (palpitations), such as heart racing or fluttering.  Dizziness.  Fainting or light-headedness.  Unexplained weakness. The monitor is wired to two electrodes placed on your chest. Electrodes are flat, sticky disks that attach to your skin. The monitor can be worn for up to 30 days. You will wear the monitor at all times, except when bathing.  HOW TO USE YOUR CARDIAC EVENT MONITOR A technician will prepare your chest for the electrode placement. The technician will show you how to place the electrodes, how to work the monitor, and how to replace the batteries. Take time to practice using the monitor before you leave the office. Make sure you understand how to send the information from the monitor to your health care provider. This requires a telephone with a landline, not a cell phone. You need to:  Wear your monitor at all times, except when you are in water:  Do not get the monitor wet.  Take the monitor off when bathing. Do not swim or use a hot tub with it on.  Keep your skin clean. Do not put body lotion or moisturizer on your chest.  Change the electrodes daily or any time they stop sticking to your skin. You might need to use tape to keep them on.  It is possible that your skin under the electrodes could become irritated. To keep this from happening, try to put the electrodes in slightly different places on your chest. However, they must remain in the area under your left breast and in the upper right section of your chest.  Make sure the monitor is safely clipped to your clothing or in a location close to your body that your health care provider recommends.  Press the button to record when you feel symptoms of heart trouble, such as  dizziness, weakness, light-headedness, palpitations, thumping, shortness of breath, unexplained weakness, or a fluttering or racing heart. The monitor is always on and records what happened slightly before you pressed the button, so do not worry about being too late to get good information.  Keep a diary of your activities, such as walking, doing chores, and taking medicine. It is especially important to note what you were doing when you pushed the button to record your symptoms. This will help your health care provider determine what might be contributing to your symptoms. The information stored in your monitor will be reviewed by your health care provider alongside your diary entries.  Send the recorded information as recommended by your health care provider. It is important to understand that it will take some time for your health care provider to process the results.  Change the batteries as recommended by your health care provider. SEEK IMMEDIATE MEDICAL CARE IF:   You have chest pain.  You have extreme difficulty breathing or shortness of breath.  You develop a very fast heartbeat that persists.  You develop dizziness that does not go away.  You faint or constantly feel you are about to faint. Document Released: 09/24/2008 Document Revised: 05/02/2014 Document Reviewed: 06/14/2013 Exit[employer] Patient Information 2015 [employer]. This information is not intended to replace advice given to you by your health care provider. Make sure you discuss any questions you have with your health care provider.  

## 2015-09-26 ENCOUNTER — Encounter: Payer: Medicare Other | Admitting: Physician Assistant

## 2015-09-27 ENCOUNTER — Encounter: Payer: Self-pay | Admitting: Physician Assistant

## 2015-09-27 NOTE — Progress Notes (Signed)
Erroneous encounter

## 2015-09-28 ENCOUNTER — Telehealth: Payer: Self-pay | Admitting: Cardiology

## 2015-09-28 NOTE — Telephone Encounter (Signed)
Nathaniel [name] is calling because he is having a allergic reaction to something , thinks its the medication . Its like small red circular spots .Marland Kitchen   Thanks

## 2015-09-28 NOTE — Telephone Encounter (Signed)
Called phone numbers again in system and as recorded as incoming message and get the same message

## 2015-09-28 NOTE — Telephone Encounter (Signed)
Called alternative number in system no message device indicated

## 2015-09-28 NOTE — Telephone Encounter (Signed)
Phone number in system and recorded as incoming gives message that, "the phone number or code is incorrect, try again".

## 2015-09-29 NOTE — Telephone Encounter (Signed)
Phone number in system and recorded as incoming gives message that, "the phone number or code is incorrect, try again".

## 2015-10-26 ENCOUNTER — Ambulatory Visit: Payer: Self-pay | Admitting: Physician Assistant

## 2015-10-26 ENCOUNTER — Encounter: Payer: Self-pay | Admitting: Physician Assistant

## 2015-10-30 ENCOUNTER — Encounter: Payer: Self-pay | Admitting: Gastroenterology

## 2015-11-01 NOTE — Telephone Encounter (Signed)
Quick Note:  Therapist, music.    Sinus rhythm both sinus bradycardia to sinus tachycardia. Minimum heart rate 53 bpm-maximal heart rate 129 bpm.   No documented PVCs or PACs despite significant symptoms   No arrhythmia noted. No PSVT, or A. fib   Only heart racing associated with sinus tachycardia at 113 bpm correlate symptoms with abnormality.   Overall negative cardiac event monitor that did not show any significant arrhythmias or even PVCs/PACs in a setting of significant patient triggered symptoms. This would suggest that these episodes are not cardiac related.  It would also suggest that his PACs/PVCs and PSVT are well-controlled on current management.  Overall stable heart rate range. Would not adjust medications currently.  Leonie Man, MD  ______

## 2015-11-13 ENCOUNTER — Emergency Department (HOSPITAL_COMMUNITY)
Admission: EM | Admit: 2015-11-13 | Discharge: 2015-11-13 | Disposition: A | Discharge status: Home / Self Care | Payer: Medicare Other | Attending: Emergency Medicine | Admitting: Emergency Medicine

## 2015-11-13 ENCOUNTER — Encounter (HOSPITAL_COMMUNITY): Payer: Self-pay | Admitting: General Practice

## 2015-11-13 ENCOUNTER — Emergency Department (HOSPITAL_COMMUNITY): Payer: Medicare Other

## 2015-11-13 DIAGNOSIS — Z79899 Other long term (current) drug therapy: Secondary | ICD-10-CM | POA: Diagnosis not present

## 2015-11-13 DIAGNOSIS — R531 Weakness: Secondary | ICD-10-CM | POA: Diagnosis not present

## 2015-11-13 DIAGNOSIS — J45909 Unspecified asthma, uncomplicated: Secondary | ICD-10-CM | POA: Diagnosis not present

## 2015-11-13 DIAGNOSIS — R079 Chest pain, unspecified: Secondary | ICD-10-CM | POA: Diagnosis present

## 2015-11-13 DIAGNOSIS — K2270 Barrett's esophagus without dysplasia: Secondary | ICD-10-CM | POA: Diagnosis not present

## 2015-11-13 DIAGNOSIS — Z7982 Long term (current) use of aspirin: Secondary | ICD-10-CM | POA: Insufficient documentation

## 2015-11-13 DIAGNOSIS — M1711 Unilateral primary osteoarthritis, right knee: Secondary | ICD-10-CM | POA: Diagnosis not present

## 2015-11-13 DIAGNOSIS — Z9889 Other specified postprocedural states: Secondary | ICD-10-CM | POA: Insufficient documentation

## 2015-11-13 DIAGNOSIS — Z7951 Long term (current) use of inhaled steroids: Secondary | ICD-10-CM | POA: Insufficient documentation

## 2015-11-13 DIAGNOSIS — Z8673 Personal history of transient ischemic attack (TIA), and cerebral infarction without residual deficits: Secondary | ICD-10-CM | POA: Insufficient documentation

## 2015-11-13 DIAGNOSIS — M79662 Pain in left lower leg: Secondary | ICD-10-CM | POA: Diagnosis not present

## 2015-11-13 DIAGNOSIS — Z8639 Personal history of other endocrine, nutritional and metabolic disease: Secondary | ICD-10-CM | POA: Insufficient documentation

## 2015-11-13 DIAGNOSIS — H9190 Unspecified hearing loss, unspecified ear: Secondary | ICD-10-CM | POA: Diagnosis not present

## 2015-11-13 LAB — CBC
HCT: 42.1 % (ref 39.0–52.0)
Hemoglobin: 14.1 g/dL (ref 13.0–17.0)
MCH: 31.1 pg (ref 26.0–34.0)
MCHC: 33.5 g/dL (ref 30.0–36.0)
MCV: 92.9 fL (ref 78.0–100.0)
Platelets: 163 10*3/uL (ref 150–400)
RBC: 4.53 MIL/uL (ref 4.22–5.81)
RDW: 13 % (ref 11.5–15.5)
WBC: 5 10*3/uL (ref 4.0–10.5)

## 2015-11-13 LAB — D-DIMER, QUANTITATIVE: D-Dimer, Quant: 0.36 ug/mL-FEU (ref 0.00–0.48)

## 2015-11-13 LAB — I-STAT TROPONIN, ED
TROPONIN I, POC: 0.01 ng/mL (ref 0.00–0.08)
Troponin i, poc: 0 ng/mL (ref 0.00–0.08)

## 2015-11-13 LAB — BASIC METABOLIC PANEL
ANION GAP: 10 (ref 5–15)
BUN: 13 mg/dL (ref 6–20)
CALCIUM: 8.7 mg/dL — AB (ref 8.9–10.3)
CO2: 23 mmol/L (ref 22–32)
Chloride: 106 mmol/L (ref 101–111)
Creatinine, Ser: 0.77 mg/dL (ref 0.61–1.24)
GFR calc Af Amer: >60 mL/min (ref >60)
GLUCOSE: 119 mg/dL — AB (ref 65–99)
Potassium: 3.8 mmol/L (ref 3.5–5.1)
Sodium: 139 mmol/L (ref 135–145)

## 2015-11-13 NOTE — ED Provider Notes (Signed)
CSN: [id number]     Arrival date & time 11/13/15  [id number] History   First MD Initiated Contact with Patient 11/13/15 1023     Chief Complaint  Patient presents with  . Chest Pain    HPI   Nathaniel [name] is a 54 y.o. deaf male with a PMH of HLD, GERD who presents to the ED with chest pain, which he states started this morning around 8:30 AM. He reports he has had intermittent left calf cramping over the past several weeks, precipitated by walking or exercising. He states he woke up this morning, and went to the store, when he began to experience "sharp stabbing" chest pain. He states he "tried to tell a friend what was going on," and felt weak, causing him to fall to the ground. He denies hitting his head, LOC, or additional injury. He denies exacerbating factors. He was given 324 MG ASA and 2 nitroglycerin tablets by EMS, with subsequent symptom improvement. He denies fever, chills, headache, lightheadedness, dizziness, shortness of breath, abdominal pain, nausea, vomiting, diarrhea, constipation, numbness, weakness, paresthesia, history of DVT or PE, history of malignancy, recent travel or immobility, recent surgery.   Past Medical History  Diagnosis Date  . Deaf     Call [phone] for patient's sign language interpreter.   . Barrett's esophagus 10-25-14 pt denies    states "minor"  . Asthma     prn inhaler  . TIA (transient ischemic attack) 10-25-14 pt denies    no current deficits  . Seasonal allergies   . High cholesterol   . Heartburn     occasional, related to certain foods  . Arthritis     right knee  . 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 Intermittent palpitations 10-25-14 patient denies    Event Monitor 04/2014: Mostly NSR - intermittent PACs, - 2 brief runs of PAT/PSVT  . Vasovagal syncope   . Tachycardia    Past Surgical History  Procedure Laterality Date  . Bunionectomy Left 2012    right done also  . Knee arthroscopy Left 06/02/2012  . Knee arthroscopy Right fall  2013    x 2  . Inguinal hernia repair    . Cardiac catheterization  06/24/2011    wwidely patent normal coronary arteries with normal ejection fraction  . Knee arthroscopy Left 03/03/2013    Procedure: ARTHROSCOPY KNEE WITH CHONDROPLASTY PATELLA  AND LATERAL TIBIAL PLATEAU;  Surgeon: Kerin Salen, MD;  Location: [place];  Service: Orthopedics;  Laterality: Left;  . Foot implant removal Right 07/15/2013    @ [place]  . Nm myoview ltd  2012    small fixed basal inferior artifact. Normal EF.  . Carotid artery dopplers  2012    tortuous but no stenoses.. No subclavian disease  . Upper and lower extremity arterial dopplers  2012    normal arterial flow bilateral upper extremities including subclavian is a carotid;;R. ABI 1.2, L. ABI 1.28. Normal flow velocities. Likely calcified vessels.  . Abdominal aortic ultrasound  2012    normal abdominal aorta  . Transthoracic echocardiogram  June2015    nnormal EF: 55-60%. No wall motion abnormalities. Gr 1 DD.  Mild MR. Otherwise normal  . Colonoscopy with propofol N/A 11/03/2014    Procedure: COLONOSCOPY WITH PROPOFOL;  Surgeon: Milus Banister, MD;  Location: [facility];  Service: Endoscopy;  Laterality: N/A;   Family History  Problem Relation Age of Onset  . Ovarian cancer Mother   . Coronary artery  disease Father    Social History  Substance Use Topics  . Smoking status: Never Smoker   . Smokeless tobacco: Never Used  . Alcohol Use: No      Review of Systems  Constitutional: Negative for fever and chills.  Respiratory: Negative for shortness of breath.   Cardiovascular: Positive for chest pain.  Gastrointestinal: Negative for nausea, vomiting, abdominal pain, diarrhea and constipation.  Musculoskeletal: Positive for myalgias.  Neurological: Negative for dizziness, syncope, weakness, light-headedness, numbness and headaches.  All other systems reviewed and are negative.     Allergies  Shrimp; Simvastatin; Tamsulosin;  and Fish allergy  Home Medications   Prior to Admission medications   Medication Sig Start Date End Date Taking? Authorizing Provider  albuterol (PROVENTIL HFA;VENTOLIN HFA) 108 (90 BASE) MCG/ACT inhaler Inhale 1-2 puffs into the lungs every 6 (six) hours as needed for wheezing or shortness of breath. 04/16/15  Yes Pamella Pert, MD  albuterol (PROVENTIL) (2.5 MG/3ML) 0.083% nebulizer solution Take 3 mLs (2.5 mg total) by nebulization every 4 (four) hours as needed for wheezing or shortness of breath. 04/16/15  Yes Pamella Pert, MD  aspirin EC 325 MG tablet Take 325 mg by mouth daily.   Yes Historical Provider, MD  esomeprazole (NEXIUM) 40 MG capsule Take 1 capsule by mouth daily as needed. 03/22/15  Yes Historical Provider, MD  fexofenadine (ALLEGRA) 180 MG tablet Take 180 mg by mouth daily.   Yes Historical Provider, MD  flecainide (TAMBOCOR) 50 MG tablet Take 1 tablet (50 mg total) by mouth 2 (two) times daily. 08/23/15  Yes Leonie Man, MD  fluticasone [facility]) 50 MCG/ACT nasal spray Place 1 spray into both nostrils daily. 02/04/14  Yes Historical Provider, MD  Fluticasone-Salmeterol (ADVAIR) 250-50 MCG/DOSE AEPB Inhale 1 puff into the lungs daily.    Yes Historical Provider, MD  metoprolol tartrate (LOPRESSOR) 25 MG tablet Take 1 tablet by mouth 2 (two) times daily. 09/12/15  Yes Historical Provider, MD  mometasone-formoterol (DULERA) 100-5 MCG/ACT AERO Inhale 1 puff into the lungs daily.   Yes Historical Provider, MD    BP 131/94 mmHg  Pulse 83  Temp(Src) 98.3 F (36.8 C) (Oral)  Resp 20  SpO2 98% Physical Exam  Constitutional: He is oriented to person, place, and time. He appears well-developed and well-nourished. No distress.  HENT:  Head: Normocephalic and atraumatic.  Right Ear: External ear normal.  Left Ear: External ear normal.  Nose: Nose normal.  Mouth/Throat: Uvula is midline, oropharynx is clear and moist and mucous membranes are normal.  Eyes: Conjunctivae, EOM  and lids are normal. Pupils are equal, round, and reactive to light. Right eye exhibits no discharge. Left eye exhibits no discharge. No scleral icterus.  Neck: Normal range of motion. Neck supple.  Cardiovascular: Normal rate, regular rhythm, normal heart sounds, intact distal pulses and normal pulses.   Pulmonary/Chest: Effort normal and breath sounds normal. No respiratory distress. He has no wheezes. He has no rales.  Abdominal: Soft. Normal appearance and bowel sounds are normal. He exhibits no distension and no mass. There is no tenderness. There is no rigidity, no rebound and no guarding.  Musculoskeletal: Normal range of motion. He exhibits tenderness. He exhibits no edema.  Mild tenderness palpation of left posterior calf. No palpable cords or varicosities. Full range of motion of lower extremities bilaterally. Strength and sensation intact. Distal pulses intact.  Neurological: He is alert and oriented to person, place, and time. He has normal strength. No sensory deficit.  Skin:  Skin is warm, dry and intact. No rash noted. He is not diaphoretic. No erythema. No pallor.  Psychiatric: He has a normal mood and affect. His speech is normal and behavior is normal.  Nursing note and vitals reviewed.   ED Course  Procedures (including critical care time)  Labs Review Labs Reviewed  BASIC METABOLIC PANEL - Abnormal; Notable for the following:    Glucose, Bld 119 (*)    Calcium 8.7 (*)    All other components within normal limits  CBC  D-DIMER, QUANTITATIVE (NOT AT [facility])  I-STAT TROPOININ, ED  Randolm Idol, ED    Imaging Review Dg Chest 2 View  11/13/2015  CLINICAL DATA:  This morning started having chest pain felt dizzy and nauseated EXAM: CHEST  2 VIEW COMPARISON:  09/03/2015 FINDINGS: Heart size is normal. Lungs are clear. No pulmonary edema. Mildly prominent interstitial markings are stable. IMPRESSION: No evidence for acute  abnormality. Electronically Signed   By: Nolon Nations M.D.   On: 11/13/2015 11:51     I have personally reviewed and evaluated these images and lab results as part of my medical decision-making.   EKG Interpretation None      MDM   Final diagnoses:  Chest pain, unspecified chest pain type    54 year old male presents with chest pain, which he states started this morning. Also reports several weeks of left calf cramping. Denies fever, chills, headache, lightheadedness, dizziness, shortness of breath, abdominal pain, nausea, vomiting, diarrhea, constipation, numbness, weakness, paresthesia, history of DVT or PE, history of malignancy, recent travel or immobility, recent surgery. Patient reports symptom improvement s/p ASA and nitro administration. Per cardiology note 08/23/15, patient has had extensive work-up for palpitations, chest pain, and "pass-out spells," including nuclear stress test, echo, and vascular US, which have been unremarkable.  Patient is afebrile. Vital signs stable. Heart regular rate and rhythm. Lungs clear to auscultation bilaterally. Abdomen soft, nontender, nondistended. No lower extremity edema. Mild tenderness palpation of left posterior calf. No palpable cords or varicosities. Full range of motion of lower extremities bilaterally. No lower extremity edema. Strength and sensation intact. Distal pulses intact.   CBC negative for leukocytosis or anemia. BMP unremarkable. EKG sinus rhythm, heart rate 82. Troponin negative.  Chest x-ray no evidence of acute abnormality. D-dimer negative.  Will obtain delta troponin.  Delta troponin negative.  Patient discussed with and seen by Dr. Ralene Bathe. D-dimer negative, doubt DVT or PE. Calf cramping occurs with walking and exercising, likely muscular. HEART score 2 due to age and risk factors (HLD). Given unremarkable work-up in the ED and duration of time since symptom onset, low suspicion for ACS.  Patient is well-appearing, feel he is stable for discharge at this time. Return  precautions discussed. Patient to follow-up with PCP. Patient verbalizes his understanding and is in agreement with plan.  BP 131/94 mmHg  Pulse 83  Temp(Src) 98.3 F (36.8 C) (Oral)  Resp 20  SpO2 98%      Marella Chimes, PA-C 11/13/15 [place], MD 11/18/15 1501

## 2015-11-13 NOTE — ED Notes (Signed)
PA at the bedside.

## 2015-11-13 NOTE — ED Notes (Signed)
Pt presents via [facility] with complaints of chest pain that started this morning around 0830. Pt was at a coffee shop when the pain started. Pt is A/O. Pt denies any radiation, N/V, or SOB. Pt is deaf, but is able to read lips and can read blue or black ink. EMS gave pt 324 mg of ASA, and 2 nitroglycerin tablets. Nitro decreased pts pain from a 8 to a 7/10.

## 2015-11-13 NOTE — ED Notes (Signed)
Patient returned from X-ray 

## 2015-11-13 NOTE — Discharge Instructions (Signed)
1. Medications: usual home medications 2. Treatment: rest, drink plenty of fluids 3. Follow Up: please followup with your primary doctor this week for discussion of your diagnoses and further evaluation after today's visit; if you do not have a primary care doctor use the resource guide provided to find one; please return to the ER for severe chest pain, shortness of breath, new or worsening symptoms   Nonspecific Chest Pain  Chest pain can be caused by many different conditions. There is always a chance that your pain could be related to something serious, such as a heart attack or a blood clot in your lungs. Chest pain can also be caused by conditions that are not life-threatening. If you have chest pain, it is very important to follow up with your health care provider. CAUSES  Chest pain can be caused by:  Heartburn.  Pneumonia or bronchitis.  Anxiety or stress.  Inflammation around your heart (pericarditis) or lung (pleuritis or pleurisy).  A blood clot in your lung.  A collapsed lung (pneumothorax). It can develop suddenly on its own (spontaneous pneumothorax) or from trauma to the chest.  Shingles infection (varicella-zoster virus).  Heart attack.  Damage to the bones, muscles, and cartilage that make up your chest wall. This can include:  Bruised bones due to injury.  Strained muscles or cartilage due to frequent or repeated coughing or overwork.  Fracture to one or more ribs.  Sore cartilage due to inflammation (costochondritis). RISK FACTORS  Risk factors for chest pain may include:  Activities that increase your risk for trauma or injury to your chest.  Respiratory infections or conditions that cause frequent coughing.  Medical conditions or overeating that can cause heartburn.  Heart disease or family history of heart disease.  Conditions or health behaviors that increase your risk of developing a blood clot.  Having had chicken pox (varicella  zoster). SIGNS AND SYMPTOMS Chest pain can feel like:  Burning or tingling on the surface of your chest or deep in your chest.  Crushing, pressure, aching, or squeezing pain.  Dull or sharp pain that is worse when you move, cough, or take a deep breath.  Pain that is also felt in your back, neck, shoulder, or arm, or pain that spreads to any of these areas. Your chest pain may come and go, or it may stay constant. DIAGNOSIS Lab tests or other studies may be needed to find the cause of your pain. Your health care provider may have you take a test called an ambulatory ECG (electrocardiogram). An ECG records your heartbeat patterns at the time the test is performed. You may also have other tests, such as:  Transthoracic echocardiogram (TTE). During echocardiography, sound waves are used to create a picture of all of the heart structures and to look at how blood flows through your heart.  Transesophageal echocardiogram (TEE).This is a more advanced imaging test that obtains images from inside your body. It allows your health care provider to see your heart in finer detail.  Cardiac monitoring. This allows your health care provider to monitor your heart rate and rhythm in real time.  Holter monitor. This is a portable device that records your heartbeat and can help to diagnose abnormal heartbeats. It allows your health care provider to track your heart activity for several days, if needed.  Stress tests. These can be done through exercise or by taking medicine that makes your heart beat more quickly.  Blood tests.  Imaging tests. TREATMENT  Your treatment  depends on what is causing your chest pain. Treatment may include:  Medicines. These may include:  Acid blockers for heartburn.  Anti-inflammatory medicine.  Pain medicine for inflammatory conditions.  Antibiotic medicine, if an infection is present.  Medicines to dissolve blood clots.  Medicines to treat coronary artery  disease.  Supportive care for conditions that do not require medicines. This may include:  Resting.  Applying heat or cold packs to injured areas.  Limiting activities until pain decreases. HOME CARE INSTRUCTIONS  If you were prescribed an antibiotic medicine, finish it all even if you start to feel better.  Avoid any activities that bring on chest pain.  Do not use any tobacco products, including cigarettes, chewing tobacco, or electronic cigarettes. If you need help quitting, ask your health care provider.  Do not drink alcohol.  Take medicines only as directed by your health care provider.  Keep all follow-up visits as directed by your health care provider. This is important. This includes any further testing if your chest pain does not go away.  If heartburn is the cause for your chest pain, you may be told to keep your head raised (elevated) while sleeping. This reduces the chance that acid will go from your stomach into your esophagus.  Make lifestyle changes as directed by your health care provider. These may include:  Getting regular exercise. Ask your health care provider to suggest some activities that are safe for you.  Eating a heart-healthy diet. A registered dietitian can help you to learn healthy eating options.  Maintaining a healthy weight.  Managing diabetes, if necessary.  Reducing stress. SEEK MEDICAL CARE IF:  Your chest pain does not go away after treatment.  You have a rash with blisters on your chest.  You have a fever. SEEK IMMEDIATE MEDICAL CARE IF:   Your chest pain is worse.  You have an increasing cough, or you cough up blood.  You have severe abdominal pain.  You have severe weakness.  You faint.  You have chills.  You have sudden, unexplained chest discomfort.  You have sudden, unexplained discomfort in your arms, back, neck, or jaw.  You have shortness of breath at any time.  You suddenly start to sweat, or your skin gets  clammy.  You feel nauseous or you vomit.  You suddenly feel light-headed or dizzy.  Your heart begins to beat quickly, or it feels like it is skipping beats. These symptoms may represent a serious problem that is an emergency. Do not wait to see if the symptoms will go away. Get medical help right away. Call your local emergency services (911 in the U.S.). Do not drive yourself to the hospital.   This information is not intended to replace advice given to you by your health care provider. Make sure you discuss any questions you have with your health care provider.   Document Released: 09/25/2005 Document Revised: 01/06/2015 Document Reviewed: 07/22/2014 Elsevier Interactive Patient Education 2016 [employer].    Emergency Department Resource Guide 1) Find a Doctor and Pay Out of Pocket Although you won't have to find out who is covered by your insurance plan, it is a good idea to ask around and get recommendations. You will then need to call the office and see if the doctor you have chosen will accept you as a new patient and what types of options they offer for patients who are self-pay. Some doctors offer discounts or will set up payment plans for their patients who do  not have insurance, but you will need to ask so you aren't surprised when you get to your appointment.  2) Contact Your Local Health Department Not all health departments have doctors that can see patients for sick visits, but many do, so it is worth a call to see if yours does. If you don't know where your local health department is, you can check in your phone book. The CDC also has a tool to help you locate your state's health department, and many state websites also have listings of all of their local health departments.  3) Find a [facility] If your illness is not likely to be very severe or complicated, you may want to try a walk in clinic. These are popping up all over the country in pharmacies, drugstores, and  shopping centers. They're usually staffed by nurse practitioners or physician assistants that have been trained to treat common illnesses and complaints. They're usually fairly quick and inexpensive. However, if you have serious medical issues or chronic medical problems, these are probably not your best option.  No Primary Care Doctor: - Call Health Connect at  [phone] - they can help you locate a primary care doctor that  accepts your insurance, provides certain services, etc. - Physician Referral Service- [phone]  Chronic Pain Problems: Organization         Address  Phone   Notes  [facility]  [phone] Patients need to be referred by their primary care doctor.   Medication Assistance: Organization         Address  Phone   Notes  [facility] [address] [place], [place] [address] [phone] --Must be a resident of [facility] -- Must have NO insurance coverage whatsoever (no Medicaid/ Medicare, etc.) -- The pt. MUST have a primary care doctor that directs their care regularly and follows them in the community   MedAssist  [phone]   [employer]  [phone]    Agencies that provide inexpensive medical care: Organization         Address  Phone   Notes  [place]  [phone]   [facility]    [phone]   [facility] [place], [place] [address] [phone]   [place] [address] [place] [phone]   Planned Parenthood    [phone]   [facility]    [phone]   [place] and [address], [place] Phone:  [phone], Fax:  [phone] Hours of Operation:  9 am - 6 pm, M-F.  Also accepts Medicaid/Medicare and self-pay.  [facility] for [place], [address], [place] Phone: [phone],  Fax: [phone]. Hours of Operation:  8:30 am - 5:30 pm, M-F.  Also accepts Medicaid and self-pay.  [facility] [place] [address], [place] Phone: [phone]   [place], [place], [place] [phone], Ext. [phone] Mondays & Thursdays: 7-9 AM.  First 15 patients are seen on a first come, first serve basis.    Campus Providers:  Organization         Address  Phone   Notes  [facility] [address], [place] [phone] Also accepts self-pay patients.  [place] [phone]  [place], [place]  [phone]   [place], [address], [place] [phone]   [place] [address] [place] [phone]   Lucianne Lei [address], [place]   [phone] Only accepts [place] Access [place] patients after they have their name applied to their card.   Self-Pay (no insurance) in [facility]:  Organization         Address  Phone   Notes  Sickle Cell Patients, [facility] [place] [phone]   [facility] [place] [phone]   [facility] [place]  [place], [place], [place] [phone]   [facility]/Dr. Osei-Bonsu  [address] [place] or [address], [place] [phone] Phone number for both [place] and [place] locations is the same.  Urgent Medical and [facility] [address] [place] [phone]   [facility] [address] [place] or [address] [phone]   [facility] [address], [place] [phone], phone; [phone], fax Sees patients 1st and 3rd Saturday of every month.  Must not qualify for public or private  insurance (i.e. Medicaid, Medicare, [place] Health Choice, Veterans' Benefits)  Household income should be no more than 200% of the poverty level The clinic cannot treat you if you are pregnant or think you are pregnant  Sexually transmitted diseases are not treated at the clinic.    Dental Care: Organization         Address  Phone  Notes  [facility] Department of [facility] [place] [phone] Accepts children up to age 12 who are enrolled in [place] or [place]; pregnant women with a Medicaid card; and children who have applied for Medicaid or [place] Health Choice, but were declined, whose parents can pay a reduced fee at time of service.  [facility] Department of [facility]  [address], [place] [phone] Accepts children up to age 70 who are enrolled in [place] or [place]; pregnant women with a Medicaid card; and children who have applied for Medicaid or [place] Health Choice, but were declined, whose parents can pay a reduced fee at time of service.  [place] Adult Dental Access PROGRAM  [place] [phone] Patients are seen by appointment only. Walk-ins are not accepted. [place] will see patients 7 years of age and older. Monday - Tuesday (8am-5pm) Most Wednesdays (8:30-5pm) $30 per visit, cash only  [facility] Adult Dental Access PROGRAM  [address], [facility] [phone] Patients are seen by appointment only. Walk-ins are not accepted. [place] will see patients 25 years of age and older. One Wednesday Evening (Monthly: Volunteer Based).  $30 per visit, cash only  [place]  [phone] for adults; Children under age 53, call Graduate Pediatric Dentistry at [phone]. Children aged 64-14, please call [phone] to request a pediatric application.  Dental services are provided in all areas of dental care  including fillings, crowns and bridges, complete and partial dentures, implants, gum treatment, root canals, and extractions. Preventive care is also provided. Treatment is provided to both adults and children. Patients are  selected via a lottery and there is often a waiting list.   [facility] [address] [place]  [phone] [email or web address]   [facility] [address] [place], [place] [phone], Ext. [phone] Second and Fourth Thursday of each month, opens at 6:30 AM; Clinic ends at 9 AM.  Patients are seen on a first-come first-served basis, and a limited number are seen during each clinic.   [facility]  [address] Hillard Danker [place], [place] [phone]   Eligibility Requirements You must have lived in [place], [place], or [place] counties for at least the last three months.   You cannot be eligible for state or federal sponsored [employer], including [employer], [place], or [employer].   You generally cannot be eligible for healthcare insurance through your employer.    How to apply: Eligibility screenings are held every Tuesday and Wednesday afternoon from 1:00 pm until 4:00 pm. You do not need an appointment for the interview!  [facility] [address], [place], [place]   [place]  Painter Department  [place]  [phone]    Behavioral Health Resources in the Community: Intensive Outpatient Programs Organization         Address  Phone  Notes  [place]. [address], [place], [place] [phone]   [facility] Outpatient [address] [place], [place]   ADS: Alcohol & Drug Svcs [address] [place], [place]   [place] [address]  [place], [place] or [phone]    Substance Abuse Resources Organization         Address  Phone  Notes  Alcohol and Drug Services  [phone]   [place]  [phone]   [facility] [place]   Chinita Pester  [phone]   Residential & Outpatient Substance Abuse Program  [phone]   Psychological Services Organization         Address  Phone  Notes  [facility]  [place]  [phone]   [place] [address], [place] or [phone]    Mobile Crisis Teams Organization         Address  Phone  Notes  [facility], Mobile Crisis Care Unit  [phone]   [facility]  [address] [place], [place]   [place] [address], [place] [phone]    Self-Help/Support Groups Organization         Address  Phone             Notes  [place] of [place] - variety of support groups  [place] Call for more information  Narcotics Anonymous (NA), [facility]aring [facility] [address], [employer] [place]  2 meetings at this location   Special educational needs teacher         Address  Phone  Notes  [facility] Residential Treatment [place],    [place]  [phone]   [facility]  [address], [place] [phone], [place], [place]   [place], [place] [phone] Admissions: 8am-3pm M-F  Incentives Substance [place] [address],    [place], [place]   The [place]  Jadene Pierini [place], [place]   [facility] [address],  [place], [place]   [facility] Programs - Intensive Outpatient [address] Kristeen [address], [place], [place]   [facility] [place].,  [place], [place] [phone] or [phone]   Residential Treatment  Services (RTS) [address], [place], [place] Accepts Medicaid  [facility] [place] [address],  [place] [phone] Substance Abuse/Addiction Treatment   [facility] Organization         Address  Phone  Notes  [facility]  [phone]   Domenic Schwab, PhD [address] Arlis Porta [place], [place]   [phone] or [phone]   Isabela [place], [place] [phone]   [facility] [address] [place], [place] [phone] Insurance/Medicaid/sponsorship through [facility] and Families [address], [address] [place]                                    [place], [place] [phone] [place] [address], [place] [phone]    Dr. Adele Schilder  [phone]   [facility] Dept. 1) [address], [place] 2) [place] 3)  [place] [address], [place] [phone]  [phone]   [place] [phone] or [phone] (After Hours)

## 2015-11-13 NOTE — ED Notes (Signed)
Pt given sprites and food.  1330 troponin obtained by lab.

## 2015-12-08 ENCOUNTER — Telehealth: Payer: Self-pay | Admitting: Cardiology

## 2015-12-12 ENCOUNTER — Ambulatory Visit: Payer: Self-pay | Admitting: Cardiology

## 2015-12-15 NOTE — Telephone Encounter (Signed)
Closed encounter °

## 2015-12-26 ENCOUNTER — Ambulatory Visit (AMBULATORY_SURGERY_CENTER): Payer: Self-pay

## 2015-12-26 VITALS — Ht 71.0 in | Wt 166.0 lb

## 2015-12-26 DIAGNOSIS — K2270 Barrett's esophagus without dysplasia: Secondary | ICD-10-CM

## 2015-12-26 NOTE — Progress Notes (Signed)
No egg or soy allergies Not on home 02 No previous anesthesia complications No diet or weight loss meds 

## 2016-01-09 ENCOUNTER — Encounter: Payer: Self-pay | Admitting: Gastroenterology

## 2016-01-09 ENCOUNTER — Ambulatory Visit (AMBULATORY_SURGERY_CENTER): Payer: Medicare Other | Admitting: Gastroenterology

## 2016-01-09 VITALS — BP 118/73 | HR 71 | Temp 97.7°F | Resp 15 | Ht 71.0 in | Wt 166.0 lb

## 2016-01-09 DIAGNOSIS — K2270 Barrett's esophagus without dysplasia: Secondary | ICD-10-CM | POA: Diagnosis present

## 2016-01-09 MED ORDER — SODIUM CHLORIDE 0.9 % IV SOLN: 500.0000 mL | INTRAVENOUS | Status: DC

## 2016-01-09 NOTE — Progress Notes (Signed)
Called to room to assist during endoscopic procedure.  Patient ID and intended procedure confirmed with present staff. Received instructions for my participation in the procedure from the performing physician.  

## 2016-01-09 NOTE — Progress Notes (Signed)
Pt is deaf.  He has an interpreter, Drue Dun, [place].  maw

## 2016-01-09 NOTE — Patient Instructions (Signed)
YOU HAD AN ENDOSCOPIC PROCEDURE TODAY AT [facility]:   Refer to the procedure report that was given to you for any specific questions about what was found during the examination.  If the procedure report does not answer your questions, please call your gastroenterologist to clarify.  If you requested that your care partner not be given the details of your procedure findings, then the procedure report has been included in a sealed envelope for you to review at your convenience later.  YOU SHOULD EXPECT: Please Note:  You might notice some irritation and congestion in your nose or some drainage.  This is from the oxygen used during your procedure.  There is no need for concern and it should clear up in a day or so.  SYMPTOMS TO REPORT IMMEDIATELY:    Following upper endoscopy (EGD)  Vomiting of blood or coffee ground material  New chest pain or pain under the shoulder blades  Painful or persistently difficult swallowing  New shortness of breath  Fever of 100F or higher  Black, tarry-looking stools  For urgent or emergent issues, a gastroenterologist can be reached at any hour by calling [phone].   DIET: Your first meal following the procedure should be a small meal and then it is ok to progress to your normal diet. Heavy or fried foods are harder to digest and may make you feel nauseous or bloated.  Likewise, meals heavy in dairy and vegetables can increase bloating.  Drink plenty of fluids but you should avoid alcoholic beverages for 24 hours.  ACTIVITY:  You should plan to take it easy for the rest of today and you should NOT DRIVE or use heavy machinery until tomorrow (because of the sedation medicines used during the test).    FOLLOW UP: Our staff will call the number listed on your records the next business day following your procedure to check on you and address any questions or concerns that you may have regarding the information given to you following your  procedure. If we do not reach you, we will leave a message.  However, if you are feeling well and you are not experiencing any problems, there is no need to return our call.  We will assume that you have returned to your regular daily activities without incident.  If any biopsies were taken you will be contacted by phone or by letter within the next 1-3 weeks.  Please call us at [phone] if you have not heard about the biopsies in 3 weeks.    SIGNATURES/CONFIDENTIALITY: You and/or your care partner have signed paperwork which will be entered into your electronic medical record.  These signatures attest to the fact that that the information above on your After Visit Summary has been reviewed and is understood.  Full responsibility of the confidentiality of this discharge information lies with you and/or your care-partner.  Await pathology results Continue Nexium daily

## 2016-01-09 NOTE — Op Note (Signed)
[place]  [employer] [place], [id number]   ENDOSCOPY PROCEDURE REPORT  PATIENT: Nathaniel [name], [name]  MR#: [id number] BIRTHDATE: 06-Aug-1961 , 56  yrs. old GENDER: male ENDOSCOPIST: Milus Banister, MD PROCEDURE DATE:  01/09/2016 PROCEDURE:  EGD w/ biopsy ASA CLASS:     Class III INDICATIONS:  Barrett's esophagus.  November 2007 EGD with single tongue of salmon-colored mucosa short segment.  Biopsies showed no dysplasia.  EGD 2008 same (IM without dysplasia).  EGD 2010 same, EGD 2013 same (IM without dysplasia), recall 3 years.Marland Kitchen MEDICATIONS: Monitored anesthesia care and Propofol 100 mg IV TOPICAL ANESTHETIC: none  DESCRIPTION OF PROCEDURE: After the risks benefits and alternatives of the procedure were thoroughly explained, informed consent was obtained.  The LB [id number] endoscope was introduced through the mouth and advanced to the second portion of the duodenum , Without limitations.  The instrument was slowly withdrawn as the mucosa was fully examined.    Short segment Barrett's appearing mucosa ([place] C0M1.5) without nodularity.  This was sampled with biopsy and sent to pathology. There was a 1cm sliding hiatal hernia.  The examination was otherwise normal.  Retroflexed views revealed no abnormalities. The scope was then withdrawn from the patient and the procedure completed.  COMPLICATIONS: There were no immediate complications.  ENDOSCOPIC IMPRESSION: Short segment Barrett's appearing mucosa (Pr[place] C0M1.5) without nodularity.  This was sampled with biopsy and sent to pathology. There was a 1cm sliding hiatal hernia.  The examination was otherwise normal  RECOMMENDATIONS: Await pathology results Continue nexium once daily   eSigned:  Milus Banister, MD 01/09/2016 9:07 AM

## 2016-01-10 ENCOUNTER — Telehealth: Payer: Self-pay | Admitting: *Deleted

## 2016-01-10 NOTE — Telephone Encounter (Signed)
  Follow up Call-  Call back number 01/09/2016  Post procedure Call Back phone  # [phone] Pt is deaf.  Leave a message and it will interpret for the pt.  He knows to call us back if any questions or problem.  Permission to leave phone message Yes     Patient questions:  Deaf operator came on the line...answering machine picked up and a message was left.  Patient will get back to [place] if he needs [place].

## 2016-01-17 ENCOUNTER — Encounter: Payer: Self-pay | Admitting: Cardiology

## 2016-01-17 ENCOUNTER — Other Ambulatory Visit: Payer: Self-pay | Admitting: *Deleted

## 2016-01-17 ENCOUNTER — Encounter: Payer: Self-pay | Admitting: Gastroenterology

## 2016-01-17 ENCOUNTER — Ambulatory Visit (INDEPENDENT_AMBULATORY_CARE_PROVIDER_SITE_OTHER): Payer: Medicare Other | Admitting: Cardiology

## 2016-01-17 VITALS — BP 116/70 | HR 72 | Ht 71.0 in | Wt 172.0 lb

## 2016-01-17 DIAGNOSIS — I471 Supraventricular tachycardia: Secondary | ICD-10-CM

## 2016-01-17 DIAGNOSIS — R002 Palpitations: Secondary | ICD-10-CM

## 2016-01-17 DIAGNOSIS — R0789 Other chest pain: Secondary | ICD-10-CM

## 2016-01-17 DIAGNOSIS — T671XXD Heat syncope, subsequent encounter: Secondary | ICD-10-CM

## 2016-01-17 DIAGNOSIS — R079 Chest pain, unspecified: Secondary | ICD-10-CM

## 2016-01-17 DIAGNOSIS — E785 Hyperlipidemia, unspecified: Secondary | ICD-10-CM

## 2016-01-17 MED ORDER — METOPROLOL TARTRATE 25 MG PO TABS: 25.0000 mg | ORAL_TABLET | Freq: Two times a day (BID) | ORAL | Status: DC

## 2016-01-17 NOTE — Assessment & Plan Note (Signed)
I suspect that his episode last summer was related to heat exposure & dehydration -- reiterated the importance of adequate hydration.

## 2016-01-17 NOTE — Telephone Encounter (Signed)
E-sent to pharmacy Patient request - refill

## 2016-01-17 NOTE — Assessment & Plan Note (Signed)
Monitored by PCP

## 2016-01-17 NOTE — Patient Instructions (Addendum)
CONTINUE WITH TAKING METOPROLOL ,OKAY TO TO TAKE EXTRA DOSE IF NEEDED IF YOU MISS A DOSE , MAKE TAKE IT THEN  OR IF CLOSE TO SECOND DOSE TAKE IT AND ADJUST OR TAKE THE NEXT DOSE THE NEXT DAY.  CONTINUE TO USE FLECAINIDE AS NEEDED   CONTINUE TO KEEP HYDRATED   Your physician wants you to follow-up in July 2017 with Dr Ellyn Hack.  If during well at that time the next time will be in 12 months. You will receive a reminder letter in the mail two months in advance. If you don't receive a letter, please call our office to schedule the follow-up appointment.  If you need a refill on your cardiac medications before your next appointment, please call your pharmacy.

## 2016-01-17 NOTE — Assessment & Plan Note (Signed)
Thankfully, with current Rx of BB, these episodes seem to have calmed down. OK to use additional dose (& to take doses out of sequence if missed). Using Flecainide PRN for now.

## 2016-01-17 NOTE — Assessment & Plan Note (Signed)
Well controlled now with BB & PRN Flecainide.

## 2016-01-17 NOTE — Assessment & Plan Note (Signed)
Not currently a complaint.  Definitely some MSK component, but he notes Sx associated with palpitation spells

## 2016-01-17 NOTE — Progress Notes (Signed)
PCP: Pcp Not In System  Clinic Note: Chief Complaint  Patient presents with  . Palpitations    brief runs of PSVT    HPI: Nathaniel [name] is a 55 y.o. male with a PMH below who presents today for 5 month f/u for multiple complaints including palpitations & chest discomfort.. He has had a complete evaluation for palpitations and chest pain and discomfort in various potential vascular episodes. He hass had nuclear stress tests, echocardiogram, a vascular ultrasound all of which have been unrevealing. Event Monitor in May 2015 did show 2 short runs of PAT/PSVT. He has had intermittent "pass-out spells"  Nathaniel [name] was last seen in Aug 2016 - started on Flecainide for PSVT control  Recent Hospitalizations: ER x 1 in November for CP & Palpitations -- he had missed a couple does of Metoprolol.  Studies Reviewed: n/a HISTORY ASSISTED BY CONTRACTED SIGN LANGUAGE INTERPRETER  Interval History: Nathaniel [name] presents today doing well with no real complaints.  On a couple of occasions, he had breakthrough palpitation spells - usually associated with missing a Metoprolol dose.  He has taken PRN Flecainide ~3-4 x / month for brief episodes with rapid relief.   He only notes the chest discomfort when he has rapid HR spells.    Besides the ED visit, he has done very well with current Rx plan.  He has been making a conscious effort to take BB as directed & staying adequately hydrated.   No chest pain or shortness of breath with rest or exertion.  No PND, orthopnea or edema.  No lightheadedness, dizziness, weakness or syncope/near syncope. No TIA/amaurosis fugax symptoms.  ROS: A comprehensive was performed. Review of Systems  Constitutional: Negative for malaise/fatigue.  HENT: Negative for nosebleeds.        Seasonal allergies  Eyes: Negative for blurred vision.  Respiratory: Negative for cough and shortness of breath.   Cardiovascular: Positive for palpitations.  Gastrointestinal:  Negative for blood in stool and melena.  Genitourinary: Negative for hematuria.  Musculoskeletal: Negative.   Neurological: Negative for dizziness, loss of consciousness, weakness and headaches.  Endo/Heme/Allergies: Does not bruise/bleed easily.  Psychiatric/Behavioral: Negative.   All other systems reviewed and are negative.  Past Medical History  Diagnosis Date  . Deaf     Call [phone] for patient's sign language interpreter.   . Barrett's esophagus 10-25-14 pt denies    states "minor"  . Asthma     prn inhaler  . TIA (transient ischemic attack) 10-25-14 pt denies    no current deficits  . Seasonal allergies   . High cholesterol   . Heartburn     occasional, related to certain foods  . Arthritis     right knee  . 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 Intermittent palpitations 10-25-14 patient denies    Event Monitor 04/2014: Mostly NSR - intermittent PACs, - 2 brief runs of PAT/PSVT  . Vasovagal syncope   . Tachycardia   . Allergy   . GERD (gastroesophageal reflux disease)   . Stroke [facility]) 2013    TIA    Past Surgical History  Procedure Laterality Date  . Bunionectomy Left 2012    right done also  . Knee arthroscopy Left 06/02/2012  . Knee arthroscopy Right fall 2013    x 2  . Inguinal hernia repair    . Cardiac catheterization  06/24/2011    wwidely patent normal coronary arteries with normal ejection fraction  . Knee arthroscopy Left 03/03/2013  Procedure: ARTHROSCOPY KNEE WITH CHONDROPLASTY PATELLA  AND LATERAL TIBIAL PLATEAU;  Surgeon: Kerin Salen, MD;  Location: [place];  Service: Orthopedics;  Laterality: Left;  . Foot implant removal Right 07/15/2013    @ [place]  . Nm myoview ltd  2012    small fixed basal inferior artifact. Normal EF.  . Carotid artery dopplers  2012    tortuous but no stenoses.. No subclavian disease  . Upper and lower extremity arterial dopplers  2012    normal arterial flow bilateral upper extremities including  subclavian is a carotid;;R. ABI 1.2, L. ABI 1.28. Normal flow velocities. Likely calcified vessels.  . Abdominal aortic ultrasound  2012    normal abdominal aorta  . Transthoracic echocardiogram  June2015    nnormal EF: 55-60%. No wall motion abnormalities. Gr 1 DD.  Mild MR. Otherwise normal  . Colonoscopy with propofol N/A 11/03/2014    Procedure: COLONOSCOPY WITH PROPOFOL;  Surgeon: Milus Banister, MD;  Location: [facility];  Service: Endoscopy;  Laterality: N/A;  . Colonoscopy  2015  . Upper gastrointestinal endoscopy  2013    Prior to Admission medications   Medication Sig Start Date End Date Taking? Authorizing Provider  albuterol (PROVENTIL HFA;VENTOLIN HFA) 108 (90 BASE) MCG/ACT inhaler Inhale 1-2 puffs into the lungs every 6 (six) hours as needed for wheezing or shortness of breath. 04/16/15  Yes Pamella Pert, MD  albuterol (PROVENTIL) (2.5 MG/3ML) 0.083% nebulizer solution Take 3 mLs (2.5 mg total) by nebulization every 4 (four) hours as needed for wheezing or shortness of breath. 04/16/15  Yes Pamella Pert, MD  amoxicillin (AMOXIL) 500 MG capsule  01/01/16  Yes Historical Provider, MD  aspirin EC 325 MG tablet Take 325 mg by mouth daily.   Yes Historical Provider, MD  esomeprazole (NEXIUM) 40 MG capsule Take 1 capsule by mouth daily as needed. 03/22/15  Yes Historical Provider, MD  fexofenadine (ALLEGRA) 180 MG tablet Take 180 mg by mouth daily.   Yes Historical Provider, MD  flecainide (TAMBOCOR) 50 MG tablet Take 1 tablet (50 mg total) by mouth 2 (two) times daily. 08/23/15  Yes Leonie Man, MD  fluticasone [facility]) 50 MCG/ACT nasal spray Place 1 spray into both nostrils daily. 02/04/14  Yes Historical Provider, MD  Fluticasone-Salmeterol (ADVAIR) 250-50 MCG/DOSE AEPB Inhale 1 puff into the lungs daily.    Yes Historical Provider, MD  HYDROcodone-acetaminophen (NORCO/VICODIN) 5-325 MG tablet  01/01/16  Yes Historical Provider, MD  metoprolol tartrate (LOPRESSOR) 25 MG tablet  Take 1 tablet by mouth 2 (two) times daily. 09/12/15  Yes Historical Provider, MD  mometasone-formoterol (DULERA) 100-5 MCG/ACT AERO Inhale 1 puff into the lungs daily.   Yes Historical Provider, MD   Allergies  Allergen Reactions  . Shrimp [Shellfish Allergy] Rash and Itching  . Simvastatin Other (See Comments)    DEPRESSION, VISUAL DISTURBANCE(FLASHING LIGHTS)  . Tamsulosin Other (See Comments)    Changes in behavior   . Fish Allergy Rash    SOFTSHELL FISH  . Fish-Derived Products Rash    SOFTSHELL FISH     Social History   Social History  . Marital Status: Married    Spouse Name: N/A  . Number of Children: 1  . Years of Education: college   Occupational History  . disability    Social History Main Topics  . Smoking status: Never Smoker   . Smokeless tobacco: Never Used  . Alcohol Use: 0.0 oz/week    0 Standard drinks or equivalent per  week     Comment: occ  . Drug Use: No  . Sexual Activity: Not Asked   Other Topics Concern  . None   Social History Narrative   He is deaf, requiring an interpreter.   He started a new job back in 2013, which was much less stressful for him.  He has subsequently gone on to disability.   He is a married father of one. He previously wrote his bike 2 times a week.   Does not smoke or drink.         Family History  Problem Relation Age of Onset  . Ovarian cancer Mother   . Coronary artery disease Father   . Colon cancer Neg Hx   . Stomach cancer Neg Hx   . Esophageal cancer Neg Hx   . Rectal cancer Neg Hx     Wt Readings from Last 3 Encounters:  01/17/16 172 lb (78.019 kg)  01/09/16 166 lb (75.297 kg)  12/26/15 166 lb (75.297 kg)    PHYSICAL EXAM BP 116/70 mmHg  Pulse 72  Ht 5\' 11"  (1.803 m)  Wt 172 lb (78.019 kg)  BMI 24.00 kg/m2 General: Pleasant, NAD. Speaks using sign language ; healthy appearing, but mildly disheveled. Psych: Normal affect. Easily excited Neuro: Alert and oriented X 3. Moves all extremities  spontaneously. Grossly normal. HEENT: [place]/AT, EOMI, MMM Neck: Supple without bruits or JVD.  Lungs: CPAP, nonlabored, good air movement. No W./R./R. Except for mild extremity wheezing in the bases. Heart: RRR no s3, s4, or murmurs.  Abdomen: Soft, non-tender, non-distended, BS + x 4.  Extremities: No C/C/E. DP/PT/Radials 2+ and equal bilaterally.Cranial nerves: normal (II-XII grossly intact)    Adult ECG Report - n/a  Other studies Reviewed: Additional studies/ records that were reviewed today include:  Recent Labs:   Lab Results  Component Value Date   CREATININE 0.77 11/13/2015     ASSESSMENT / PLAN: Problem List Items Addressed This Visit    Syncope    I suspect that his episode last summer was related to heat exposure & dehydration -- reiterated the importance of adequate hydration.       PSVT (paroxysmal supraventricular tachycardia) (HCC) - Primary (Chronic)    Thankfully, with current Rx of BB, these episodes seem to have calmed down. OK to use additional dose (& to take doses out of sequence if missed). Using Flecainide PRN for now.      Palpitations (Chronic)    Well controlled now with BB & PRN Flecainide.      Hyperlipidemia    Monitored by PCP      Chest pain of uncertain etiology (Chronic)    Not currently a complaint.  Definitely some MSK component, but he notes Sx associated with palpitation spells         Current medicines are reviewed at length with the patient today. (+/- concerns) n/a The following changes have been made: none   CONTINUE WITH TAKING METOPROLOL ,OKAY TO TO TAKE EXTRA DOSE IF NEEDED IF YOU MISS A DOSE , MAKE TAKE IT THEN  OR IF CLOSE TO SECOND DOSE TAKE IT AND ADJUST OR TAKE THE NEXT DOSE THE NEXT DAY.  CONTINUE TO USE FLECAINIDE AS NEEDED   CONTINUE TO KEEP HYDRATED   Your physician wants you to follow-up in July 2017 with Dr Ellyn Hack.  If during well at that time the next time will be in 12 months.   Studies  Ordered:   No orders of the defined  types were placed in this encounter.     Leonie Man, M.D., M.S. Interventional Cardiologist   Pager # [phone] Phone # [phone] [address]. [place], [place] [address]

## 2016-03-06 ENCOUNTER — Emergency Department (HOSPITAL_COMMUNITY): Payer: Medicare Other

## 2016-03-06 ENCOUNTER — Telehealth: Payer: Self-pay | Admitting: *Deleted

## 2016-03-06 ENCOUNTER — Emergency Department (HOSPITAL_COMMUNITY)
Admission: EM | Admit: 2016-03-06 | Discharge: 2016-03-06 | Disposition: A | Discharge status: Home / Self Care | Payer: Medicare Other | Attending: Emergency Medicine | Admitting: Emergency Medicine

## 2016-03-06 ENCOUNTER — Encounter (HOSPITAL_COMMUNITY): Payer: Self-pay | Admitting: Emergency Medicine

## 2016-03-06 DIAGNOSIS — M1990 Unspecified osteoarthritis, unspecified site: Secondary | ICD-10-CM | POA: Diagnosis not present

## 2016-03-06 DIAGNOSIS — Z8673 Personal history of transient ischemic attack (TIA), and cerebral infarction without residual deficits: Secondary | ICD-10-CM | POA: Insufficient documentation

## 2016-03-06 DIAGNOSIS — Z9889 Other specified postprocedural states: Secondary | ICD-10-CM | POA: Insufficient documentation

## 2016-03-06 DIAGNOSIS — Z7951 Long term (current) use of inhaled steroids: Secondary | ICD-10-CM | POA: Diagnosis not present

## 2016-03-06 DIAGNOSIS — Z7982 Long term (current) use of aspirin: Secondary | ICD-10-CM | POA: Insufficient documentation

## 2016-03-06 DIAGNOSIS — Z79899 Other long term (current) drug therapy: Secondary | ICD-10-CM | POA: Diagnosis not present

## 2016-03-06 DIAGNOSIS — J45901 Unspecified asthma with (acute) exacerbation: Secondary | ICD-10-CM | POA: Diagnosis not present

## 2016-03-06 DIAGNOSIS — H9190 Unspecified hearing loss, unspecified ear: Secondary | ICD-10-CM | POA: Diagnosis not present

## 2016-03-06 DIAGNOSIS — J45909 Unspecified asthma, uncomplicated: Secondary | ICD-10-CM | POA: Diagnosis present

## 2016-03-06 DIAGNOSIS — Z8639 Personal history of other endocrine, nutritional and metabolic disease: Secondary | ICD-10-CM | POA: Diagnosis not present

## 2016-03-06 DIAGNOSIS — K219 Gastro-esophageal reflux disease without esophagitis: Secondary | ICD-10-CM | POA: Diagnosis not present

## 2016-03-06 DIAGNOSIS — J189 Pneumonia, unspecified organism: Secondary | ICD-10-CM

## 2016-03-06 LAB — CBC WITH DIFFERENTIAL/PLATELET
BASOS ABS: 0 10*3/uL (ref 0.0–0.1)
BASOS PCT: 1 %
EOS ABS: 0.1 10*3/uL (ref 0.0–0.7)
EOS PCT: 1 %
HEMATOCRIT: 42 % (ref 39.0–52.0)
Hemoglobin: 14.2 g/dL (ref 13.0–17.0)
Lymphocytes Relative: 29 %
Lymphs Abs: 1.6 10*3/uL (ref 0.7–4.0)
MCH: 30.9 pg (ref 26.0–34.0)
MCHC: 33.8 g/dL (ref 30.0–36.0)
MCV: 91.3 fL (ref 78.0–100.0)
MONO ABS: 0.4 10*3/uL (ref 0.1–1.0)
MONOS PCT: 6 %
Neutro Abs: 3.5 10*3/uL (ref 1.7–7.7)
Neutrophils Relative %: 63 %
PLATELETS: 174 10*3/uL (ref 150–400)
RBC: 4.6 MIL/uL (ref 4.22–5.81)
RDW: 12.5 % (ref 11.5–15.5)
WBC: 5.5 10*3/uL (ref 4.0–10.5)

## 2016-03-06 LAB — BASIC METABOLIC PANEL
ANION GAP: 12 (ref 5–15)
BUN: 14 mg/dL (ref 6–20)
CALCIUM: 9.2 mg/dL (ref 8.9–10.3)
CO2: 21 mmol/L — AB (ref 22–32)
Chloride: 105 mmol/L (ref 101–111)
Creatinine, Ser: 0.87 mg/dL (ref 0.61–1.24)
Glucose, Bld: 93 mg/dL (ref 65–99)
Potassium: 3.9 mmol/L (ref 3.5–5.1)
Sodium: 138 mmol/L (ref 135–145)

## 2016-03-06 LAB — I-STAT TROPONIN, ED: TROPONIN I, POC: 0 ng/mL (ref 0.00–0.08)

## 2016-03-06 MED ORDER — ALBUTEROL SULFATE (2.5 MG/3ML) 0.083% IN NEBU: 2.5000 mg | INHALATION_SOLUTION | RESPIRATORY_TRACT | Status: DC | PRN

## 2016-03-06 MED ORDER — AMOXICILLIN 500 MG PO CAPS: 1000.0000 mg | ORAL_CAPSULE | Freq: Three times a day (TID) | ORAL | Status: DC

## 2016-03-06 MED ORDER — ALBUTEROL SULFATE HFA 108 (90 BASE) MCG/ACT IN AERS
2.0000 | INHALATION_SPRAY | Freq: Once | RESPIRATORY_TRACT | Status: AC
  Administered 2016-03-06: 2 via RESPIRATORY_TRACT
  Filled 2016-03-06: qty 6.7

## 2016-03-06 MED ORDER — PREDNISONE 20 MG PO TABS: 60.0000 mg | ORAL_TABLET | Freq: Once | ORAL | Status: DC

## 2016-03-06 MED ORDER — DOXYCYCLINE HYCLATE 100 MG PO CAPS: 100.0000 mg | ORAL_CAPSULE | Freq: Two times a day (BID) | ORAL | Status: AC

## 2016-03-06 MED ORDER — FLUTICASONE-SALMETEROL 250-50 MCG/DOSE IN AEPB: 1.0000 | INHALATION_SPRAY | Freq: Every day | RESPIRATORY_TRACT | Status: DC

## 2016-03-06 NOTE — ED Provider Notes (Signed)
CSN: [id number]     Arrival date & time 03/06/16  1128 History   First MD Initiated Contact with Patient 03/06/16 1130     Chief Complaint  Patient presents with  . Asthma     (Consider location/radiation/quality/duration/timing/severity/associated sxs/prior Treatment) HPI Comments: 6Am woke up very short of breath Wheezing a lot Almost felt like wind pipe was prickly, almost like fluid Smell in here makes it worse Ran out of inhaler at home 8 days left of adviar Out of nebulizer Out of albuterol 2 weeks ago Little bit of cough, mostly from dust/poolen No fevers Mild rhinorrhea Having allergies  Patient is a 55 y.o. male presenting with asthma.  Asthma Associated symptoms include shortness of breath. Pertinent negatives include no chest pain, no abdominal pain and no headaches.    Past Medical History  Diagnosis Date  . Deaf     Call [phone] for patient's sign language interpreter.   . Barrett's esophagus 10-25-14 pt denies    states "minor"  . Asthma     prn inhaler  . TIA (transient ischemic attack) 10-25-14 pt denies    no current deficits  . Seasonal allergies   . High cholesterol   . Heartburn     occasional, related to certain foods  . Arthritis     right knee  . 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 Intermittent palpitations 10-25-14 patient denies    Event Monitor 04/2014: Mostly NSR - intermittent PACs, - 2 brief runs of PAT/PSVT  . Vasovagal syncope   . Tachycardia   . Allergy   . GERD (gastroesophageal reflux disease)   . Stroke [facility]) 2013    TIA   Past Surgical History  Procedure Laterality Date  . Bunionectomy Left 2012    right done also  . Knee arthroscopy Left 06/02/2012  . Knee arthroscopy Right fall 2013    x 2  . Inguinal hernia repair    . Cardiac catheterization  06/24/2011    wwidely patent normal coronary arteries with normal ejection fraction  . Knee arthroscopy Left 03/03/2013    Procedure: ARTHROSCOPY KNEE WITH CHONDROPLASTY  PATELLA  AND LATERAL TIBIAL PLATEAU;  Surgeon: Kerin Salen, MD;  Location: [place];  Service: Orthopedics;  Laterality: Left;  . Foot implant removal Right 07/15/2013    @ [place]  . Nm myoview ltd  2012    small fixed basal inferior artifact. Normal EF.  . Carotid artery dopplers  2012    tortuous but no stenoses.. No subclavian disease  . Upper and lower extremity arterial dopplers  2012    normal arterial flow bilateral upper extremities including subclavian is a carotid;;R. ABI 1.2, L. ABI 1.28. Normal flow velocities. Likely calcified vessels.  . Abdominal aortic ultrasound  2012    normal abdominal aorta  . Transthoracic echocardiogram  June2015    nnormal EF: 55-60%. No wall motion abnormalities. Gr 1 DD.  Mild MR. Otherwise normal  . Colonoscopy with propofol N/A 11/03/2014    Procedure: COLONOSCOPY WITH PROPOFOL;  Surgeon: Milus Banister, MD;  [facility];  Service: Endoscopy;  Laterality: N/A;  . Colonoscopy  2015  . Upper gastrointestinal endoscopy  2013   Family History  Problem Relation Age of Onset  . Ovarian cancer Mother   . Coronary artery disease Father   . Colon cancer Neg Hx   . Stomach cancer Neg Hx   . Esophageal cancer Neg Hx   . Rectal cancer Neg Hx  Social History  Substance Use Topics  . Smoking status: Never Smoker   . Smokeless tobacco: Never Used  . Alcohol Use: 0.0 oz/week    0 Standard drinks or equivalent per week     Comment: occ    Review of Systems  Constitutional: Negative for fever.  HENT: Positive for rhinorrhea. Negative for sore throat.   Eyes: Negative for visual disturbance.  Respiratory: Positive for cough, shortness of breath and wheezing.   Cardiovascular: Negative for chest pain.  Gastrointestinal: Negative for nausea, vomiting and abdominal pain.  Genitourinary: Negative for difficulty urinating.  Musculoskeletal: Negative for back pain and neck stiffness.  Skin: Negative for rash.  Neurological:  Negative for syncope and headaches.      Allergies  Shrimp; Simvastatin; Tamsulosin; Fish allergy; and Fish-derived products  Home Medications   Prior to Admission medications   Medication Sig Start Date End Date Taking? Authorizing Provider  albuterol (PROVENTIL HFA;VENTOLIN HFA) 108 (90 BASE) MCG/ACT inhaler Inhale 1-2 puffs into the lungs every 6 (six) hours as needed for wheezing or shortness of breath. 04/16/15  Yes Pamella Pert, MD  aspirin EC 325 MG tablet Take 325 mg by mouth daily.   Yes Historical Provider, MD  esomeprazole (NEXIUM) 40 MG capsule Take 1 capsule by mouth daily as needed. 03/22/15  Yes Historical Provider, MD  fexofenadine (ALLEGRA) 180 MG tablet Take 180 mg by mouth daily.   Yes Historical Provider, MD  flecainide (TAMBOCOR) 50 MG tablet Take 1 tablet (50 mg total) by mouth 2 (two) times daily. 08/23/15  Yes Leonie Man, MD  fluticasone [facility]) 50 MCG/ACT nasal spray Place 1 spray into both nostrils daily. 02/04/14  Yes Historical Provider, MD  metoprolol tartrate (LOPRESSOR) 25 MG tablet Take 1 tablet (25 mg total) by mouth 2 (two) times daily. 01/17/16  Yes Leonie Man, MD  mometasone-formoterol (DULERA) 100-5 MCG/ACT AERO Inhale 1 puff into the lungs daily.   Yes Historical Provider, MD  albuterol (PROVENTIL) (2.5 MG/3ML) 0.083% nebulizer solution Take 3 mLs (2.5 mg total) by nebulization every 4 (four) hours as needed for wheezing or shortness of breath. 03/06/16   Gareth Morgan, MD  amoxicillin (AMOXIL) 500 MG capsule Take 2 capsules (1,000 mg total) by mouth 3 (three) times daily. 03/06/16   Gareth Morgan, MD  doxycycline (VIBRAMYCIN) 100 MG capsule Take 1 capsule (100 mg total) by mouth 2 (two) times daily. 03/06/16 03/13/16  Gareth Morgan, MD  Fluticasone-Salmeterol (ADVAIR) 250-50 MCG/DOSE AEPB Inhale 1 puff into the lungs daily. 03/06/16   Gareth Morgan, MD  HYDROcodone-acetaminophen (NORCO/VICODIN) 5-325 MG tablet  01/01/16   Historical Provider, MD    BP 127/77 mmHg  Pulse 83  Temp(Src) 98.5 F (36.9 C) (Oral)  Resp 23  SpO2 96% Physical Exam  Constitutional: He is oriented to person, place, and time. He appears well-developed and well-nourished. No distress.  HENT:  Head: Normocephalic and atraumatic.  Eyes: Conjunctivae and EOM are normal.  Neck: Normal range of motion.  Cardiovascular: Normal rate, regular rhythm, normal heart sounds and intact distal pulses.  Exam reveals no gallop and no friction rub.   No murmur heard. Pulmonary/Chest: Effort normal and breath sounds normal. No respiratory distress. He has no wheezes. He has no rales.  Abdominal: Soft. He exhibits no distension. There is no tenderness. There is no guarding.  Musculoskeletal: He exhibits no edema.  Neurological: He is alert and oriented to person, place, and time.  Skin: Skin is warm and dry. He is not  diaphoretic.  Nursing note and vitals reviewed.   ED Course  Procedures (including critical care time) Labs Review Labs Reviewed  BASIC METABOLIC PANEL - Abnormal; Notable for the following:    CO2 21 (*)    All other components within normal limits  CBC WITH DIFFERENTIAL/PLATELET  Randolm Idol, ED    Imaging Review Dg Chest 2 View  03/06/2016  CLINICAL DATA:  Shortness breath for 3 days.  History of asthma. EXAM: CHEST  2 VIEW COMPARISON:  11/13/2015 and 09/03/2015. FINDINGS: There are lower lung volumes. The heart size and mediastinal contours are stable. There are patchy left-greater-than-right basilar airspace opacities suspicious for pneumonia. The pulmonary vascularity appears normal. There is no significant pleural effusion. The bones appear stable. IMPRESSION: Left-greater-than-right basilar airspace opacities suspicious for pneumonia. Correlate clinically. Electronically Signed   By: Richardean Sale M.D.   On: 03/06/2016 12:19   I have personally reviewed and evaluated these images and lab results as part of my medical decision-making.    EKG Interpretation None      MDM   Final diagnoses:  CAP (community acquired pneumonia)  Asthma, unspecified asthma severity, with acute exacerbation, mild   55yo deaf male with history of asthma, hypercholesterolemia, PSVT, TIA, presents with concern for shortness of breath.  EKG without acute changes. Troponin negative, and have low suspicion for ACS by history.  Patient describes cough, congestion, wheezing.  XR concerning for pneumonia and given increased dyspnea, increased cough, report of wheezing at home feel symptoms may be secondary to pneumonia and mild asthma exacerbation.  Patient declines prednisone given concern for side effects (possible SVT per pt) and given patient with mild exacerbation in setting of not having bronchodilators at home.  Patient given albuterol MDI in ED with improvement, and discharged with revill of rx for advair, albuterol nebulizer as well as amoxicillin and doxycycline for pneumonia.  Patient discharged in stable condition with understanding of reasons to return.   Gareth Morgan, MD 03/06/16 2109

## 2016-03-06 NOTE — ED Notes (Signed)
Pt to Xray.

## 2016-03-06 NOTE — Discharge Instructions (Signed)
Asthma, Adult Asthma is a recurring condition in which the airways tighten and narrow. Asthma can make it difficult to breathe. It can cause coughing, wheezing, and shortness of breath. Asthma episodes, also called asthma attacks, range from minor to life-threatening. Asthma cannot be cured, but medicines and lifestyle changes can help control it. CAUSES Asthma is believed to be caused by inherited (genetic) and environmental factors, but its exact cause is unknown. Asthma may be triggered by allergens, lung infections, or irritants in the air. Asthma triggers are different for each person. Common triggers include:   Animal dander.  Dust mites.  Cockroaches.  Pollen from trees or grass.  Mold.  Smoke.  Air pollutants such as dust, household cleaners, hair sprays, aerosol sprays, paint fumes, strong chemicals, or strong odors.  Cold air, weather changes, and winds (which increase molds and pollens in the air).  Strong emotional expressions such as crying or laughing hard.  Stress.  Certain medicines (such as aspirin) or types of drugs (such as beta-blockers).  Sulfites in foods and drinks. Foods and drinks that may contain sulfites include dried fruit, potato chips, and sparkling grape juice.  Infections or inflammatory conditions such as the flu, a cold, or an inflammation of the nasal membranes (rhinitis).  Gastroesophageal reflux disease (GERD).  Exercise or strenuous activity. SYMPTOMS Symptoms may occur immediately after asthma is triggered or many hours later. Symptoms include:  Wheezing.  Excessive nighttime or early morning coughing.  Frequent or severe coughing with a common cold.  Chest tightness.  Shortness of breath. DIAGNOSIS  The diagnosis of asthma is made by a review of your medical history and a physical exam. Tests may also be performed. These may include:  Lung function studies. These tests show how much air you breathe in and out.  Allergy  tests.  Imaging tests such as X-rays. TREATMENT  Asthma cannot be cured, but it can usually be controlled. Treatment involves identifying and avoiding your asthma triggers. It also involves medicines. There are 2 classes of medicine used for asthma treatment:   Controller medicines. These prevent asthma symptoms from occurring. They are usually taken every day.  Reliever or rescue medicines. These quickly relieve asthma symptoms. They are used as needed and provide short-term relief. Your health care provider will help you create an asthma action plan. An asthma action plan is a written plan for managing and treating your asthma attacks. It includes a list of your asthma triggers and how they may be avoided. It also includes information on when medicines should be taken and when their dosage should be changed. An action plan may also involve the use of a device called a peak flow meter. A peak flow meter measures how well the lungs are working. It helps you monitor your condition. HOME CARE INSTRUCTIONS   Take medicines only as directed by your health care provider. Speak with your health care provider if you have questions about how or when to take the medicines.  Use a peak flow meter as directed by your health care provider. Record and keep track of readings.  Understand and use the action plan to help minimize or stop an asthma attack without needing to seek medical care.  Control your home environment in the following ways to help prevent asthma attacks:  Do not smoke. Avoid being exposed to secondhand smoke.  Change your heating and air conditioning filter regularly.  Limit your use of fireplaces and wood stoves.  Get rid of pests (such as roaches   and mice) and their droppings.  Throw away plants if you see mold on them.  Clean your floors and dust regularly. Use unscented cleaning products.  Try to have someone else vacuum for you regularly. Stay out of rooms while they are  being vacuumed and for a short while afterward. If you vacuum, use a dust mask from a hardware store, a double-layered or microfilter vacuum cleaner bag, or a vacuum cleaner with a HEPA filter.  Replace carpet with wood, tile, or vinyl flooring. Carpet can trap dander and dust.  Use allergy-proof pillows, mattress covers, and box spring covers.  Wash bed sheets and blankets every week in hot water and dry them in a dryer.  Use blankets that are made of polyester or cotton.  Clean bathrooms and kitchens with bleach. If possible, have someone repaint the walls in these rooms with mold-resistant paint. Keep out of the rooms that are being cleaned and painted.  Wash hands frequently. SEEK MEDICAL CARE IF:   You have wheezing, shortness of breath, or a cough even if taking medicine to prevent attacks.  The colored mucus you cough up (sputum) is thicker than usual.  Your sputum changes from clear or white to yellow, green, gray, or bloody.  You have any problems that may be related to the medicines you are taking (such as a rash, itching, swelling, or trouble breathing).  You are using a reliever medicine more than 2-3 times per week.  Your peak flow is still at 50-79% of your personal best after following your action plan for 1 hour.  You have a fever. SEEK IMMEDIATE MEDICAL CARE IF:   You seem to be getting worse and are unresponsive to treatment during an asthma attack.  You are short of breath even at rest.  You get short of breath when doing very little physical activity.  You have difficulty eating, drinking, or talking due to asthma symptoms.  You develop chest pain.  You develop a fast heartbeat.  You have a bluish color to your lips or fingernails.  You are light-headed, dizzy, or faint.  Your peak flow is less than 50% of your personal best.   This information is not intended to replace advice given to you by your health care provider. Make sure you discuss any  questions you have with your health care provider.   Document Released: 12/16/2005 Document Revised: 09/06/2015 Document Reviewed: 07/15/2013 Elsevier Interactive Patient Education 2016 [employer]  

## 2016-03-06 NOTE — ED Notes (Addendum)
Pt arrives via EMS from home, [facility], with increased SOB this AM around 630. Pt with hx of asthma, ran out of inhalers. No wheezing noted by EMS. Pt with hx of afib, SR on monitor for EMS. 20g L FA. Will page sign language interpreter. VSS. Pt afebrile, NAD at present.

## 2016-03-06 NOTE — ED Notes (Signed)
Pt given bus pass. Per patient he doesn't know how to use the bus system, normally uses a taxi. No taxi vouchers available. Explained to patient would either need to call a ride or walk or use bus system as patient is likely to be discharged shortly.

## 2016-03-31 ENCOUNTER — Emergency Department (HOSPITAL_BASED_OUTPATIENT_CLINIC_OR_DEPARTMENT_OTHER)
Admission: EM | Admit: 2016-03-31 | Discharge: 2016-03-31 | Disposition: A | Discharge status: Home / Self Care | Payer: Medicare Other | Attending: Emergency Medicine | Admitting: Emergency Medicine

## 2016-03-31 ENCOUNTER — Emergency Department (HOSPITAL_BASED_OUTPATIENT_CLINIC_OR_DEPARTMENT_OTHER): Payer: Medicare Other

## 2016-03-31 ENCOUNTER — Encounter (HOSPITAL_BASED_OUTPATIENT_CLINIC_OR_DEPARTMENT_OTHER): Payer: Self-pay | Admitting: *Deleted

## 2016-03-31 DIAGNOSIS — R079 Chest pain, unspecified: Secondary | ICD-10-CM | POA: Diagnosis not present

## 2016-03-31 DIAGNOSIS — H9190 Unspecified hearing loss, unspecified ear: Secondary | ICD-10-CM | POA: Diagnosis not present

## 2016-03-31 DIAGNOSIS — M1990 Unspecified osteoarthritis, unspecified site: Secondary | ICD-10-CM | POA: Insufficient documentation

## 2016-03-31 DIAGNOSIS — Z792 Long term (current) use of antibiotics: Secondary | ICD-10-CM | POA: Diagnosis not present

## 2016-03-31 DIAGNOSIS — Z79899 Other long term (current) drug therapy: Secondary | ICD-10-CM | POA: Insufficient documentation

## 2016-03-31 DIAGNOSIS — Z9889 Other specified postprocedural states: Secondary | ICD-10-CM | POA: Diagnosis not present

## 2016-03-31 DIAGNOSIS — Z8639 Personal history of other endocrine, nutritional and metabolic disease: Secondary | ICD-10-CM | POA: Diagnosis not present

## 2016-03-31 DIAGNOSIS — Z8673 Personal history of transient ischemic attack (TIA), and cerebral infarction without residual deficits: Secondary | ICD-10-CM | POA: Diagnosis not present

## 2016-03-31 DIAGNOSIS — R002 Palpitations: Secondary | ICD-10-CM | POA: Insufficient documentation

## 2016-03-31 DIAGNOSIS — Z7982 Long term (current) use of aspirin: Secondary | ICD-10-CM | POA: Diagnosis not present

## 2016-03-31 DIAGNOSIS — R232 Flushing: Secondary | ICD-10-CM | POA: Diagnosis not present

## 2016-03-31 DIAGNOSIS — K219 Gastro-esophageal reflux disease without esophagitis: Secondary | ICD-10-CM | POA: Insufficient documentation

## 2016-03-31 DIAGNOSIS — Z8679 Personal history of other diseases of the circulatory system: Secondary | ICD-10-CM | POA: Insufficient documentation

## 2016-03-31 DIAGNOSIS — Z7951 Long term (current) use of inhaled steroids: Secondary | ICD-10-CM | POA: Diagnosis not present

## 2016-03-31 DIAGNOSIS — J45909 Unspecified asthma, uncomplicated: Secondary | ICD-10-CM | POA: Insufficient documentation

## 2016-03-31 LAB — BASIC METABOLIC PANEL
ANION GAP: 9 (ref 5–15)
BUN: 14 mg/dL (ref 6–20)
CALCIUM: 8.7 mg/dL — AB (ref 8.9–10.3)
CO2: 21 mmol/L — ABNORMAL LOW (ref 22–32)
Chloride: 108 mmol/L (ref 101–111)
Creatinine, Ser: 0.86 mg/dL (ref 0.61–1.24)
GLUCOSE: 141 mg/dL — AB (ref 65–99)
Potassium: 3.7 mmol/L (ref 3.5–5.1)
SODIUM: 138 mmol/L (ref 135–145)

## 2016-03-31 LAB — CBC
HCT: 41.4 % (ref 39.0–52.0)
Hemoglobin: 14 g/dL (ref 13.0–17.0)
MCH: 30.8 pg (ref 26.0–34.0)
MCHC: 33.8 g/dL (ref 30.0–36.0)
MCV: 91 fL (ref 78.0–100.0)
Platelets: 197 10*3/uL (ref 150–400)
RBC: 4.55 MIL/uL (ref 4.22–5.81)
RDW: 12.6 % (ref 11.5–15.5)
WBC: 7.4 10*3/uL (ref 4.0–10.5)

## 2016-03-31 LAB — MAGNESIUM: MAGNESIUM: 2.1 mg/dL (ref 1.7–2.4)

## 2016-03-31 LAB — TROPONIN I: Troponin I: <0.03 ng/mL (ref <0.031)

## 2016-03-31 MED ORDER — SODIUM CHLORIDE 0.9 % IV BOLUS (SEPSIS): 1000.0000 mL | Freq: Once | INTRAVENOUS | Status: DC

## 2016-03-31 NOTE — Discharge Instructions (Signed)

## 2016-03-31 NOTE — ED Provider Notes (Signed)
CSN: [id number]     Arrival date & time 03/31/16  1137 History   First MD Initiated Contact with Patient 03/31/16 1149     Chief Complaint  Patient presents with  . Irregular Heart Beat    HPI Patient presents to the emergency room with complaints of palpitations and chest pain. 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 He reads lips and we are communicating by writing on a note pad. There is no translator available at this time although we are attempting to get one. Patient states he felt his heart beating fast today while at church. He felt flushed and expressed a sharp pain on his chest. He was then walked to the emergency room to be evaluated. The patient's symptoms have resolved at this point. He does have a history of SVT. He denies any history of coronary artery disease Past Medical History  Diagnosis Date  . Deaf     Call [phone] for patient's sign language interpreter.   . Barrett's esophagus 10-25-14 pt denies    states "minor"  . Asthma     prn inhaler  . TIA (transient ischemic attack) 10-25-14 pt denies    no current deficits  . Seasonal allergies   . High cholesterol   . Heartburn     occasional, related to certain foods  . Arthritis     right knee  . 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 Intermittent palpitations 10-25-14 patient denies    Event Monitor 04/2014: Mostly NSR - intermittent PACs, - 2 brief runs of PAT/PSVT  . Vasovagal syncope   . Tachycardia   . Allergy   . GERD (gastroesophageal reflux disease)   . Stroke [facility]) 2013    TIA   Past Surgical History  Procedure Laterality Date  . Bunionectomy Left 2012    right done also  . Knee arthroscopy Left 06/02/2012  . Knee arthroscopy Right fall 2013    x 2  . Inguinal hernia repair    . Cardiac catheterization  06/24/2011    wwidely patent normal coronary arteries with normal ejection fraction  . Knee arthroscopy Left 03/03/2013    Procedure: ARTHROSCOPY KNEE WITH CHONDROPLASTY PATELLA  AND LATERAL  TIBIAL PLATEAU;  Surgeon: Kerin Salen, MD;  Location: [facility];  Service: Orthopedics;  Laterality: Left;  . Foot implant removal Right 07/15/2013    @ [place]  . Nm myoview ltd  2012    small fixed basal inferior artifact. Normal EF.  . Carotid artery dopplers  2012    tortuous but no stenoses.. No subclavian disease  . Upper and lower extremity arterial dopplers  2012    normal arterial flow bilateral upper extremities including subclavian is a carotid;;R. ABI 1.2, L. ABI 1.28. Normal flow velocities. Likely calcified vessels.  . Abdominal aortic ultrasound  2012    normal abdominal aorta  . Transthoracic echocardiogram  June2015    nnormal EF: 55-60%. No wall motion abnormalities. Gr 1 DD.  Mild MR. Otherwise normal  . Colonoscopy with propofol N/A 11/03/2014    Procedure: COLONOSCOPY WITH PROPOFOL;  Surgeon: Milus Banister, MD;  Location: [facility];  Service: Endoscopy;  Laterality: N/A;  . Colonoscopy  2015  . Upper gastrointestinal endoscopy  2013   Family History  Problem Relation Age of Onset  . Ovarian cancer Mother   . Coronary artery disease Father   . Colon cancer Neg Hx   . Stomach cancer Neg Hx   . Esophageal  cancer Neg Hx   . Rectal cancer Neg Hx    Social History  Substance Use Topics  . Smoking status: Never Smoker   . Smokeless tobacco: Never Used  . Alcohol Use: 0.0 oz/week    0 Standard drinks or equivalent per week     Comment: occ    Review of Systems  All other systems reviewed and are negative.     Allergies  Shrimp; Simvastatin; Tamsulosin; Fish allergy; and Fish-derived products  Home Medications   Prior to Admission medications   Medication Sig Start Date End Date Taking? Authorizing Provider  albuterol (PROVENTIL HFA;VENTOLIN HFA) 108 (90 BASE) MCG/ACT inhaler Inhale 1-2 puffs into the lungs every 6 (six) hours as needed for wheezing or shortness of breath. 04/16/15   Pamella Pert, MD  albuterol (PROVENTIL) (2.5 MG/3ML)  0.083% nebulizer solution Take 3 mLs (2.5 mg total) by nebulization every 4 (four) hours as needed for wheezing or shortness of breath. 03/06/16   Gareth Morgan, MD  amoxicillin (AMOXIL) 500 MG capsule Take 2 capsules (1,000 mg total) by mouth 3 (three) times daily. 03/06/16   Gareth Morgan, MD  aspirin EC 325 MG tablet Take 325 mg by mouth daily.    Historical Provider, MD  esomeprazole (NEXIUM) 40 MG capsule Take 1 capsule by mouth daily as needed. 03/22/15   Historical Provider, MD  fexofenadine (ALLEGRA) 180 MG tablet Take 180 mg by mouth daily.    Historical Provider, MD  flecainide (TAMBOCOR) 50 MG tablet Take 1 tablet (50 mg total) by mouth 2 (two) times daily. 08/23/15   Leonie Man, MD  fluticasone [facility]) 50 MCG/ACT nasal spray Place 1 spray into both nostrils daily. 02/04/14   Historical Provider, MD  Fluticasone-Salmeterol (ADVAIR) 250-50 MCG/DOSE AEPB Inhale 1 puff into the lungs daily. 03/06/16   Gareth Morgan, MD  HYDROcodone-acetaminophen (NORCO/VICODIN) 5-325 MG tablet  01/01/16   Historical Provider, MD  metoprolol tartrate (LOPRESSOR) 25 MG tablet Take 1 tablet (25 mg total) by mouth 2 (two) times daily. Patient taking differently: Take 50 mg by mouth 2 (two) times daily.  01/17/16   Leonie Man, MD  mometasone-formoterol (DULERA) 100-5 MCG/ACT AERO Inhale 1 puff into the lungs daily.    Historical Provider, MD   BP 118/78 mmHg  Pulse 70  Temp(Src) 97.8 F (36.6 C) (Oral)  Resp 20  Ht 5\' 11"  (1.803 m)  Wt 75.751 kg  BMI 23.30 kg/m2  SpO2 95% Physical Exam  Constitutional: He appears well-developed and well-nourished. No distress.  HENT:  Head: Normocephalic and atraumatic.  Right Ear: External ear normal.  Left Ear: External ear normal.  Eyes: Conjunctivae are normal. Right eye exhibits no discharge. Left eye exhibits no discharge. No scleral icterus.  Neck: Neck supple. No tracheal deviation present.  Cardiovascular: Normal rate, regular rhythm and intact distal  pulses.   Pulmonary/Chest: Effort normal and breath sounds normal. No stridor. No respiratory distress. He has no wheezes. He has no rales.  Abdominal: Soft. Bowel sounds are normal. He exhibits no distension. There is no tenderness. There is no rebound and no guarding.  Musculoskeletal: He exhibits no edema or tenderness.  Neurological: He is alert. He has normal strength. No cranial nerve deficit (no facial droop, extraocular movements intact, no slurred speech) or sensory deficit. He exhibits normal muscle tone. He displays no seizure activity. Coordination normal.  Skin: Skin is warm and dry. No rash noted.  Psychiatric: He has a normal mood and affect.  Nursing note and  vitals reviewed.   ED Course  Procedures (including critical care time) Labs Review Labs Reviewed  BASIC METABOLIC PANEL - Abnormal; Notable for the following:    CO2 21 (*)    Glucose, Bld 141 (*)    Calcium 8.7 (*)    All other components within normal limits  CBC  TROPONIN I  MAGNESIUM  TROPONIN I    Imaging Review Dg Chest 2 View  03/31/2016  CLINICAL DATA:  Heart palpitations this morning with some associated chest pain. EXAM: CHEST  2 VIEW COMPARISON:  Chest x-rays dated 03/06/2016 and 11/13/2015. FINDINGS: Study is slightly hypoinspiratory with crowding of the perihilar and bibasilar bronchovascular markings. Given the low lung volumes, lungs are clear. No evidence of pneumonia. No pleural effusion or pneumothorax seen. Heart size is normal. Overall cardiomediastinal silhouette is stable in size and configuration. Osseous and soft tissue structures about the chest are unremarkable. IMPRESSION: No evidence of acute cardiopulmonary abnormality. Electronically Signed   By: Franki Cabot M.D.   On: 03/31/2016 12:33   I have personally reviewed and evaluated these images and lab results as part of my medical decision-making.   EKG Interpretation   Date/Time:  Sunday March 31 2016 11:52:01 EDT Ventricular Rate:   73 PR Interval:  146 QRS Duration: 95 QT Interval:  373 QTC Calculation: 411 R Axis:   10 Text Interpretation:  Sinus rhythm Abnormal R-wave progression, early  transition No significant change since last tracing Confirmed by Avary Eichenberger   MD-J, Dare Sanger [id number]) on 03/31/2016 11:58:32 AM      MDM   Final diagnoses:  Palpitations    Pt was monitored in the ED.  No abnormal heart rhythms noted.  Sx are atypical for cardiac disease.  2 sets of enzymes normal.  Low risk and suspicion for acute coronary syndrome.   Dorie Rank, MD 03/31/16 1534

## 2016-03-31 NOTE — ED Notes (Signed)
Dr. Tomi Bamberger at bedside. Pt communicating by writing. Pt reports he felt his heart beat fast and had a hot flash and felt weak while at church. Called a friend to pick him up. Friend at bedside. Charge made aware of need for interpreter

## 2016-03-31 NOTE — ED Notes (Signed)
Sign Language Interpretor will be here at 1245. MD notified

## 2016-03-31 NOTE — ED Notes (Signed)
Pt reports he felt like his heart beat was irregular this morning.

## 2016-03-31 NOTE — ED Notes (Signed)
Pt laying in bed, content. Interpreter at bedside.

## 2016-03-31 NOTE — ED Notes (Signed)
Pt on automatic VS, continuous pulse ox and cardiac monitoring. 

## 2016-03-31 NOTE — ED Notes (Signed)
Sign Language Interpreter here, escorted to pt room

## 2016-04-12 IMAGING — DX DG CHEST 2V
2 series · 2 of 2 positions shown · non-contrast
Comparison: 11/13/2015 and 09/03/2015.

CLINICAL DATA: Shortness breath for 3 days.  History of asthma.

EXAM:
CHEST  2 VIEW

[w chest pa]
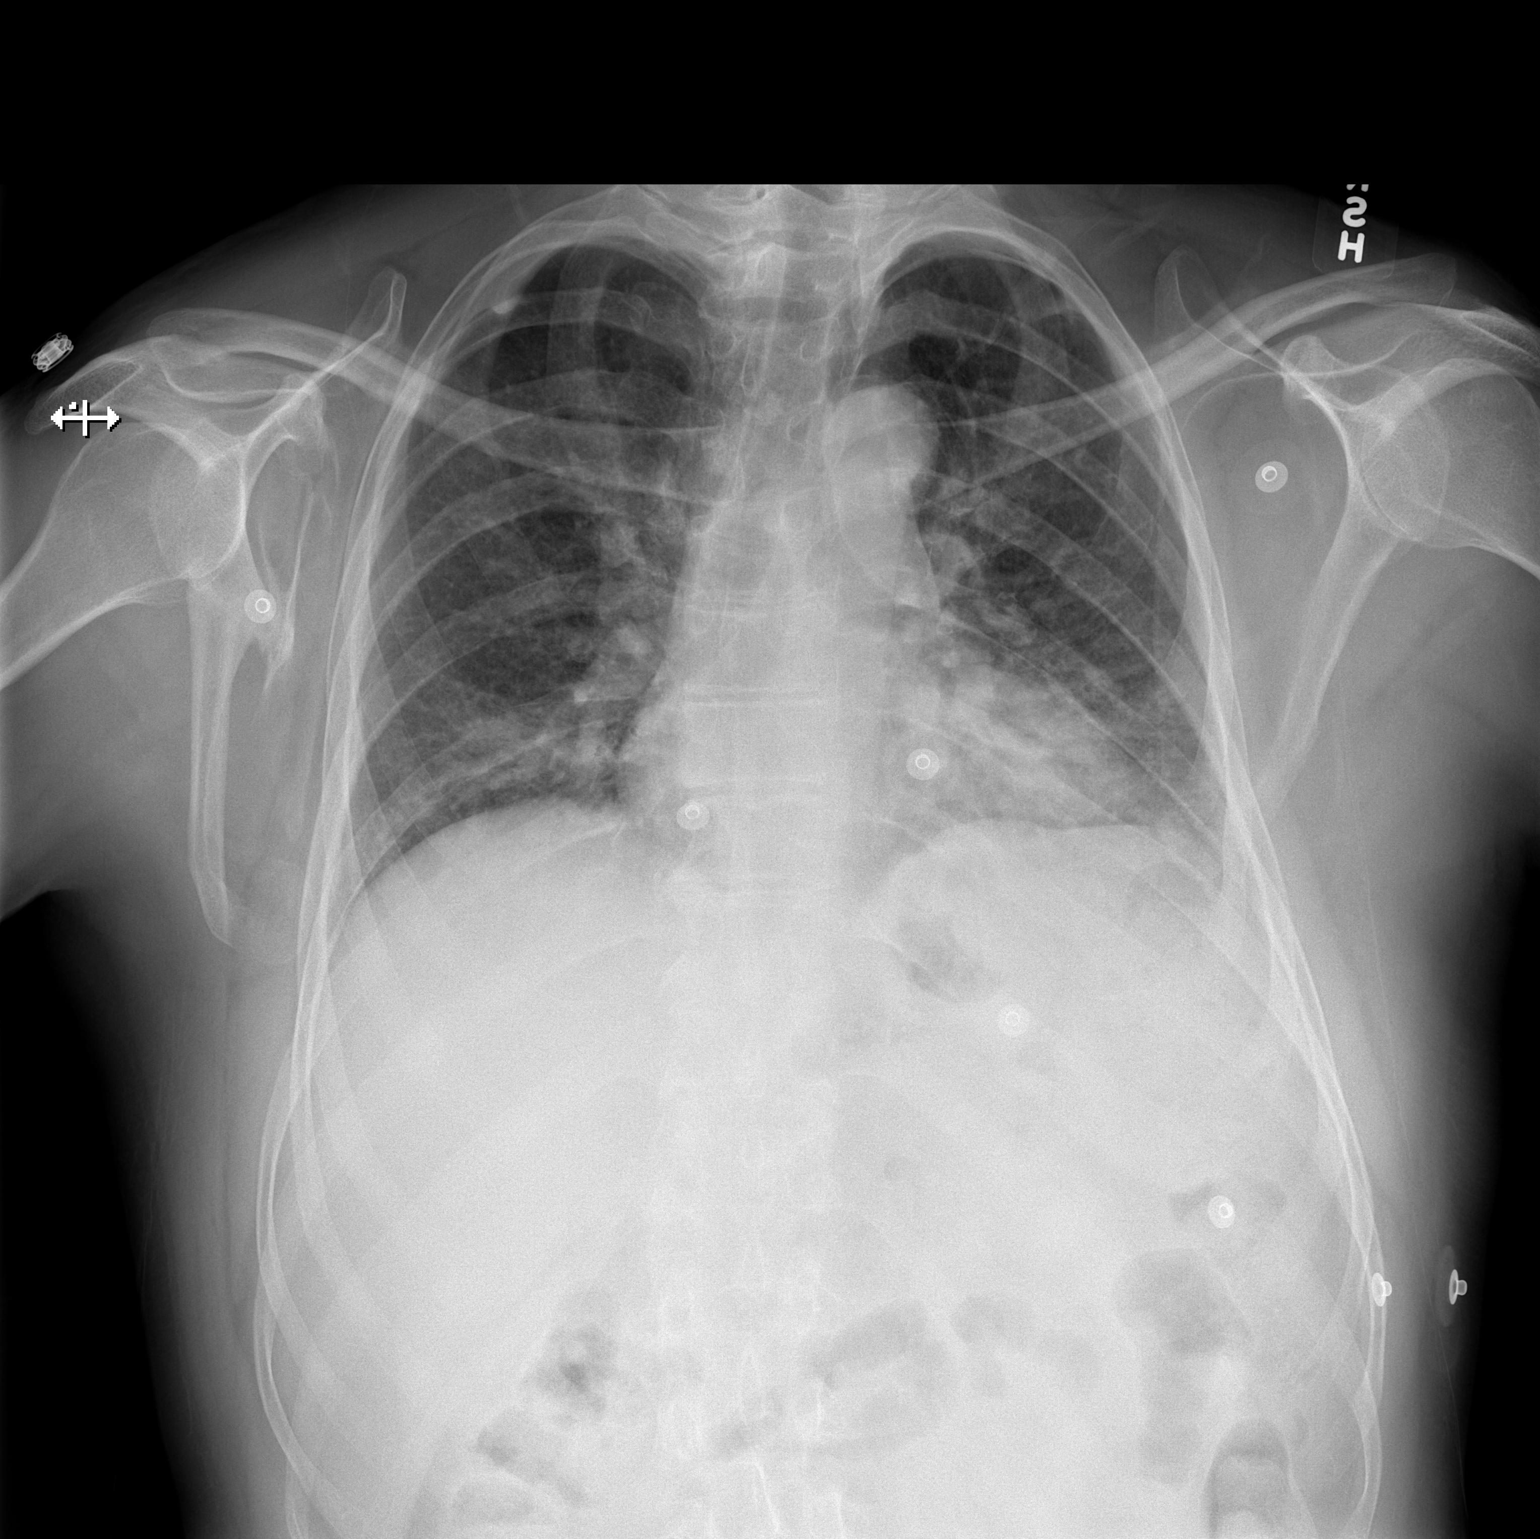

[w chest lat]
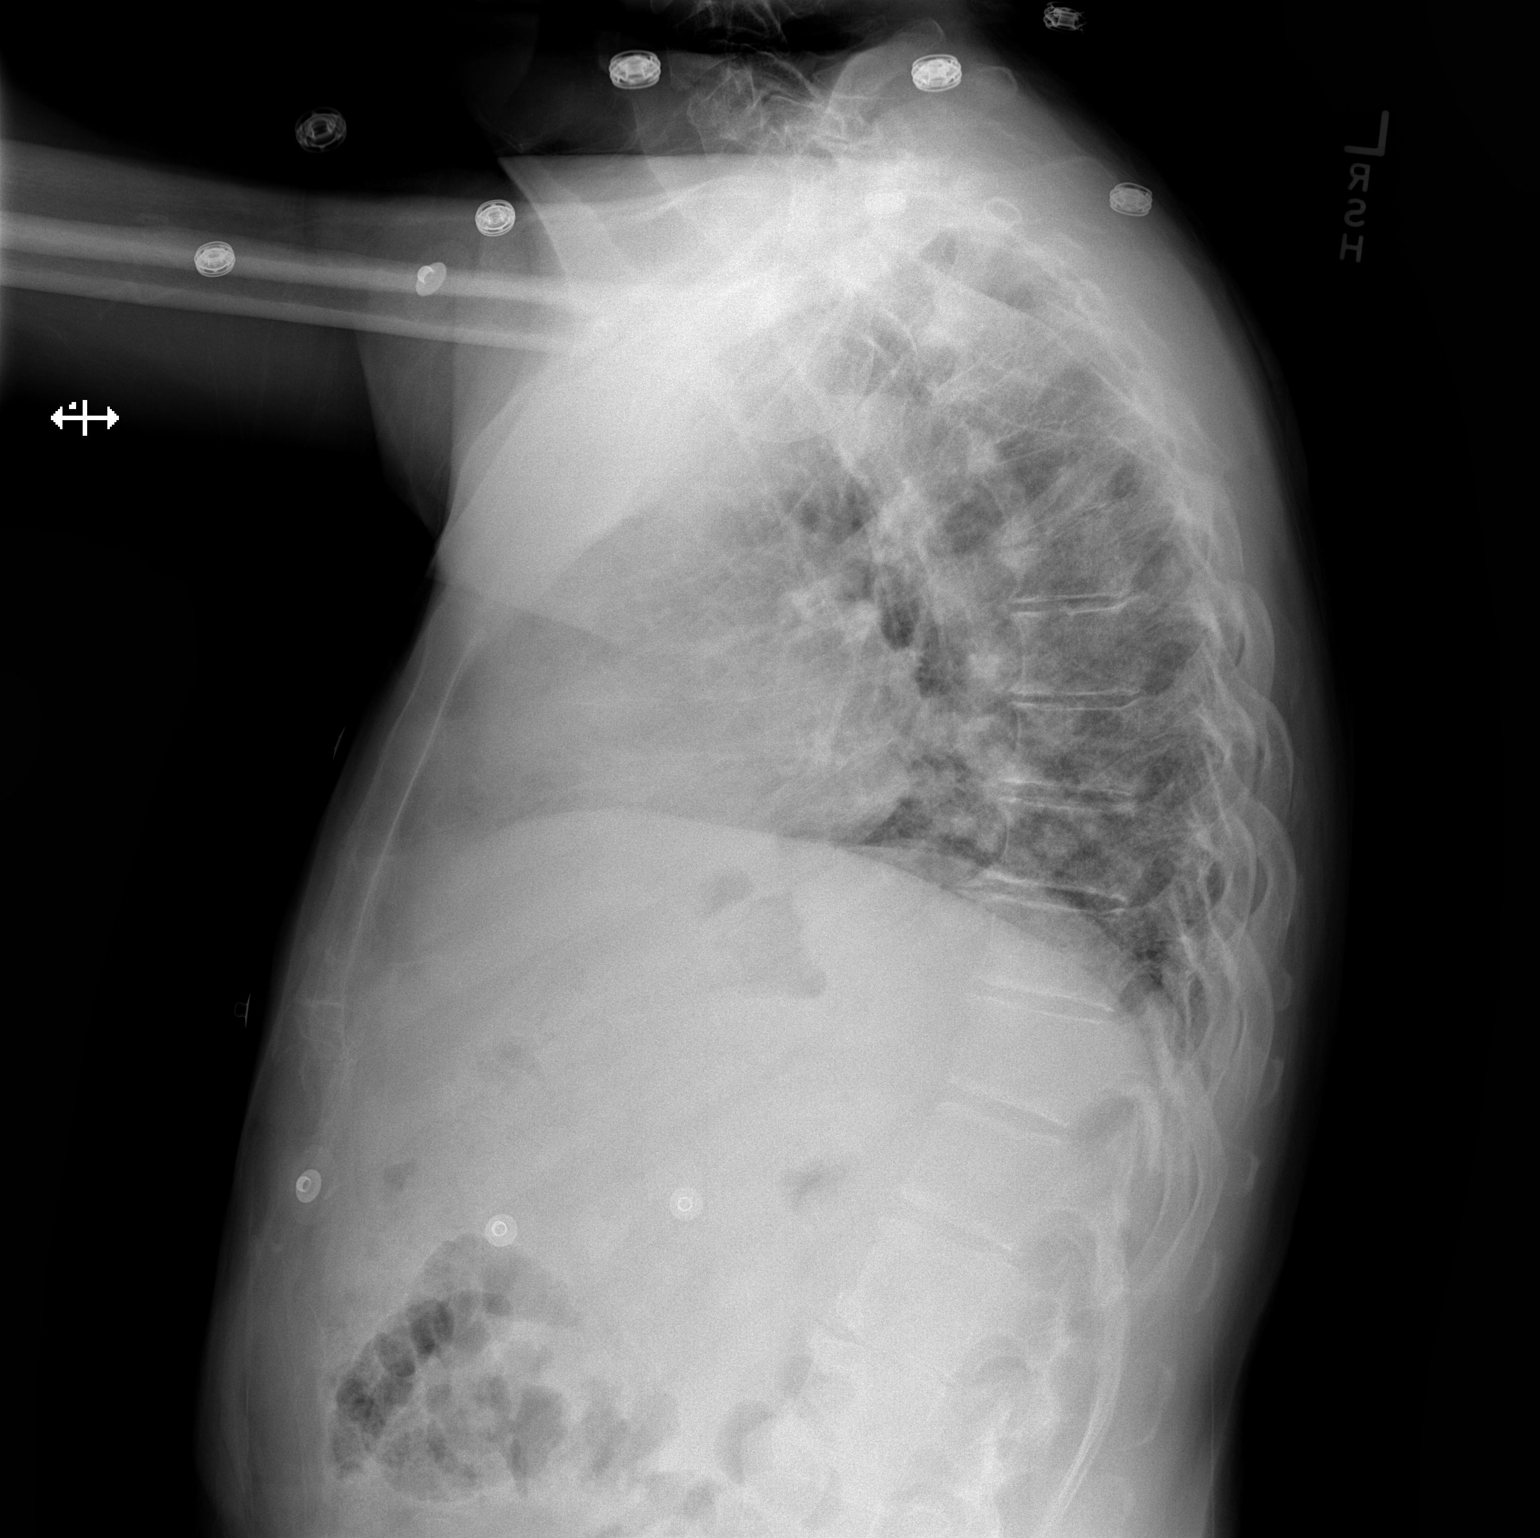

[2 of 2 positions shown; findings below may reference images not displayed]

FINDINGS: There are lower lung volumes. The heart size and mediastinal
contours are stable. There are patchy left-greater-than-right
basilar airspace opacities suspicious for pneumonia. The pulmonary
vascularity appears normal. There is no significant pleural
effusion. The bones appear stable.
IMPRESSION: Left-greater-than-right basilar airspace opacities suspicious for
pneumonia. Correlate clinically.

## 2016-06-29 HISTORY — PX: NM MYOVIEW LTD: HXRAD82

## 2016-07-09 ENCOUNTER — Emergency Department (HOSPITAL_COMMUNITY): Payer: Medicare Other

## 2016-07-09 ENCOUNTER — Observation Stay (HOSPITAL_COMMUNITY)
Admission: EM | Admit: 2016-07-09 | Discharge: 2016-07-10 | Disposition: A | Discharge status: Home / Self Care | Payer: Medicare Other | Attending: Cardiovascular Disease | Admitting: Cardiovascular Disease

## 2016-07-09 ENCOUNTER — Encounter (HOSPITAL_COMMUNITY): Payer: Self-pay | Admitting: *Deleted

## 2016-07-09 DIAGNOSIS — Z79899 Other long term (current) drug therapy: Secondary | ICD-10-CM | POA: Diagnosis not present

## 2016-07-09 DIAGNOSIS — R079 Chest pain, unspecified: Secondary | ICD-10-CM | POA: Diagnosis present

## 2016-07-09 DIAGNOSIS — Z7982 Long term (current) use of aspirin: Secondary | ICD-10-CM | POA: Insufficient documentation

## 2016-07-09 DIAGNOSIS — E785 Hyperlipidemia, unspecified: Secondary | ICD-10-CM | POA: Diagnosis present

## 2016-07-09 DIAGNOSIS — Z8673 Personal history of transient ischemic attack (TIA), and cerebral infarction without residual deficits: Secondary | ICD-10-CM | POA: Insufficient documentation

## 2016-07-09 DIAGNOSIS — I471 Supraventricular tachycardia: Secondary | ICD-10-CM | POA: Diagnosis not present

## 2016-07-09 DIAGNOSIS — I493 Ventricular premature depolarization: Secondary | ICD-10-CM | POA: Diagnosis present

## 2016-07-09 DIAGNOSIS — J45909 Unspecified asthma, uncomplicated: Secondary | ICD-10-CM | POA: Diagnosis not present

## 2016-07-09 DIAGNOSIS — R0789 Other chest pain: Secondary | ICD-10-CM | POA: Diagnosis not present

## 2016-07-09 DIAGNOSIS — R002 Palpitations: Secondary | ICD-10-CM | POA: Diagnosis not present

## 2016-07-09 DIAGNOSIS — I639 Cerebral infarction, unspecified: Secondary | ICD-10-CM | POA: Diagnosis present

## 2016-07-09 LAB — CBC WITH DIFFERENTIAL/PLATELET
Basophils Absolute: 0 10*3/uL (ref 0.0–0.1)
Basophils Relative: 1 %
Eosinophils Absolute: 0.1 10*3/uL (ref 0.0–0.7)
Eosinophils Relative: 2 %
HCT: 39.7 % (ref 39.0–52.0)
Hemoglobin: 13.5 g/dL (ref 13.0–17.0)
Lymphocytes Relative: 31 %
Lymphs Abs: 1.9 10*3/uL (ref 0.7–4.0)
MCH: 31 pg (ref 26.0–34.0)
MCHC: 34 g/dL (ref 30.0–36.0)
MCV: 91.1 fL (ref 78.0–100.0)
Monocytes Absolute: 0.4 10*3/uL (ref 0.1–1.0)
Monocytes Relative: 6 %
Neutro Abs: 3.7 10*3/uL (ref 1.7–7.7)
Neutrophils Relative %: 60 %
Platelets: 163 10*3/uL (ref 150–400)
RBC: 4.36 MIL/uL (ref 4.22–5.81)
RDW: 12.7 % (ref 11.5–15.5)
WBC: 6.1 10*3/uL (ref 4.0–10.5)

## 2016-07-09 LAB — BASIC METABOLIC PANEL
ANION GAP: 7 (ref 5–15)
BUN: 13 mg/dL (ref 6–20)
CO2: 23 mmol/L (ref 22–32)
Calcium: 8.6 mg/dL — ABNORMAL LOW (ref 8.9–10.3)
Chloride: 107 mmol/L (ref 101–111)
Creatinine, Ser: 0.85 mg/dL (ref 0.61–1.24)
Glucose, Bld: 86 mg/dL (ref 65–99)
POTASSIUM: 3.7 mmol/L (ref 3.5–5.1)
SODIUM: 137 mmol/L (ref 135–145)

## 2016-07-09 LAB — I-STAT TROPONIN, ED: Troponin i, poc: 0 ng/mL (ref 0.00–0.08)

## 2016-07-09 LAB — TROPONIN I

## 2016-07-09 MED ORDER — METOPROLOL TARTRATE 50 MG PO TABS
50.0000 mg | ORAL_TABLET | Freq: Two times a day (BID) | ORAL | Status: DC
Start: 2016-07-09 — End: 2016-07-10
  Administered 2016-07-09: 50 mg via ORAL
  Filled 2016-07-09: qty 1

## 2016-07-09 MED ORDER — MORPHINE SULFATE (PF) 4 MG/ML IV SOLN
4.0000 mg | Freq: Once | INTRAVENOUS | Status: AC
  Administered 2016-07-09: 4 mg via INTRAVENOUS
  Filled 2016-07-09: qty 1

## 2016-07-09 MED ORDER — NITROGLYCERIN 0.4 MG SL SUBL: 0.4000 mg | SUBLINGUAL_TABLET | SUBLINGUAL | Status: DC | PRN

## 2016-07-09 MED ORDER — ASPIRIN EC 81 MG PO TBEC
81.0000 mg | DELAYED_RELEASE_TABLET | Freq: Every day | ORAL | Status: DC
  Administered 2016-07-09 – 2016-07-10 (×2): 81 mg via ORAL
  Filled 2016-07-09 (×2): qty 1

## 2016-07-09 MED ORDER — ONDANSETRON HCL 4 MG/2ML IJ SOLN: 4.0000 mg | Freq: Four times a day (QID) | INTRAMUSCULAR | Status: DC | PRN

## 2016-07-09 MED ORDER — PANTOPRAZOLE SODIUM 40 MG PO TBEC
40.0000 mg | DELAYED_RELEASE_TABLET | Freq: Every day | ORAL | Status: DC
  Administered 2016-07-09 – 2016-07-10 (×2): 40 mg via ORAL
  Filled 2016-07-09 (×2): qty 1

## 2016-07-09 MED ORDER — ACETAMINOPHEN 325 MG PO TABS: 650.0000 mg | ORAL_TABLET | ORAL | Status: DC | PRN

## 2016-07-09 MED ORDER — HEPARIN SODIUM (PORCINE) 5000 UNIT/ML IJ SOLN
5000.0000 [IU] | Freq: Three times a day (TID) | INTRAMUSCULAR | Status: DC
  Filled 2016-07-09: qty 1

## 2016-07-09 NOTE — ED Notes (Signed)
[place] sandwich and ginger ale provided to pt per Loletta Specter, Therapist, sports

## 2016-07-09 NOTE — ED Notes (Signed)
Pt arrives from work via [employer]. Pt had CP at noon today while at work and then took 325mg  of ASA, pt received 2 nitro  En route that decreased pain from a 9/10 to a 3/10.

## 2016-07-09 NOTE — ED Notes (Signed)
Hearing impaired interpreter at bedside with patient.

## 2016-07-09 NOTE — H&P (Signed)
History & Physical    Patient ID: Nathaniel [name] MRN: [id number], DOB/AGE: 56-23-1962   Admit date: 07/09/2016  Primary Physician: Nathaniel Rima, MD Primary Cardiologist: Dr. Ellyn [name]  Patient Profile    55 yo male with PMH of TIA/HLD/GERD/Deafness and wore a holter monitor (2015) with episodes of PAT/PSVT who presented to the [facility] ED for chest pain.   Past Medical History    Past Medical History  Diagnosis Date  . Deaf     Call [phone] for patient's sign language interpreter.   . Barrett's esophagus 10-25-14 pt denies    states "minor"  . Asthma     prn inhaler  . TIA (transient ischemic attack) 10-25-14 pt denies    no current deficits  . Seasonal allergies   . High cholesterol   . Heartburn     occasional, related to certain foods  . Arthritis     right knee  . 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 Intermittent palpitations 10-25-14 patient denies    Event Monitor 04/2014: Mostly NSR - intermittent PACs, - 2 brief runs of PAT/PSVT  . Vasovagal syncope   . Tachycardia   . Allergy   . GERD (gastroesophageal reflux disease)   . Stroke [facility]) 2013    TIA    Past Surgical History  Procedure Laterality Date  . Bunionectomy Left 2012    right done also  . Knee arthroscopy Left 06/02/2012  . Knee arthroscopy Right fall 2013    x 2  . Inguinal hernia repair    . Cardiac catheterization  06/24/2011    wwidely patent normal coronary arteries with normal ejection fraction  . Knee arthroscopy Left 03/03/2013    Procedure: ARTHROSCOPY KNEE WITH CHONDROPLASTY PATELLA  AND LATERAL TIBIAL PLATEAU;  Surgeon: Nathaniel Salen, MD;  Location: [place];  Service: Orthopedics;  Laterality: Left;  . Foot implant removal Right 07/15/2013    @ [place]  . Nm myoview ltd  2012    small fixed basal inferior artifact. Normal EF.  . Carotid artery dopplers  2012    tortuous but no stenoses.. No subclavian disease  . Upper and lower extremity arterial dopplers  2012     normal arterial flow bilateral upper extremities including subclavian is a carotid;;R. ABI 1.2, L. ABI 1.28. Normal flow velocities. Likely calcified vessels.  . Abdominal aortic ultrasound  2012    normal abdominal aorta  . Transthoracic echocardiogram  June2015    nnormal EF: 55-60%. No wall motion abnormalities. Gr 1 DD.  Mild MR. Otherwise normal  . Colonoscopy with propofol N/A 11/03/2014    Procedure: COLONOSCOPY WITH PROPOFOL;  Surgeon: Nathaniel Banister, MD;  Location: [facility];  Service: Endoscopy;  Laterality: N/A;  . Colonoscopy  2015  . Upper gastrointestinal endoscopy  2013     Allergies  Allergies  Allergen Reactions  . Shrimp [Shellfish Allergy] Rash and Itching  . Simvastatin Other (See Comments)    DEPRESSION, VISUAL DISTURBANCE(FLASHING LIGHTS)  . Tamsulosin Other (See Comments)    Changes in behavior   . Fish Allergy Rash    SOFTSHELL FISH  . Fish-Derived Products Rash    SOFTSHELL FISH    History of Present Illness    Nathaniel [name] is a 55 yo male with PMH of TIA/HLD/GERD/Deafness and wore a holter monitor (2015) with episodes of PAT/PSVT. He has been seen by Dr. Ellyn [name] in follow up of palpitations and chest pain. Had a nuclear stress test in 2012  which was low risk. Also noted to have worn an event monitor back in 2015 showing only 2 short runs of PAT/PSVT. He was started on Flecainide in 8/16 for his PSVT. At his last office visit 1/17 he reported feeling well with no c/o chest pain, but occasional palpitations that are usually associated with missing doses of his metoprolol. No changes were made to his medications at that time.   He reports being in his normal state of health until around 7am this morning when he began not feeling well. States he proceeded to take his medication, ate breakfast and continued to work. 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 He was given a chair by an employee at the store and took 81mg  ASA. Reports the pain subsided, and he then proceeded to walk to the car. This was around 10:30 am. He then experienced another episode of chest pain that was sharp and stabbing in nature, he also felt weak in his legs at that time. His boss became concerned and called 911. The operator told him to take ASA so he took an adult bayer ASA at that time. He was concerned that the pain continued and asked for transportation to the ED. EMS proceeded to given 2 SL nitro with helped reduce the pain from a 9/10 to a 3/10. Denies any palpitations, nausea, dyspnea, or lower extremity edema. States on a daily basis he is able to perform activities of daily living without episodes of chest pain or dyspnea on exertion.   In the ED he was given one dose of morphine which relieved his chest pain. His labs showed Cr 0.85, neg poc trop, Hgb 13.5, and negative chest x-ray. EKG showed SR with no acute ST/ T wave changes. He is current pain free at this time.   Home Medications    Prior to Admission medications   Medication Sig Start Date End Date Taking? Authorizing Provider  albuterol (PROVENTIL HFA;VENTOLIN HFA) 108 (90 BASE) MCG/ACT inhaler Inhale 1-2 puffs into the lungs every 6 (six) hours as needed for wheezing or shortness of breath. 04/16/15  Yes Nathaniel Pert, MD  albuterol (PROVENTIL) (2.5 MG/3ML) 0.083% nebulizer solution Take 3 mLs (2.5 mg total) by nebulization every 4 (four) hours as needed for wheezing or shortness of breath. 03/06/16  Yes Nathaniel Morgan, MD  aspirin EC 81 MG tablet Take 81 mg by mouth once.   Yes Historical Provider, MD  esomeprazole (NEXIUM) 40 MG capsule Take 1 capsule by mouth daily as needed (for heartburn).  03/22/15  Yes Historical Provider, MD  fexofenadine (ALLEGRA) 180 MG tablet Take 180 mg by mouth daily.   Yes Historical Provider, MD  fluticasone (FLONASE) 50 MCG/ACT nasal spray Place 1 spray into both nostrils  daily. 02/04/14  Yes Historical Provider, MD  Fluticasone-Salmeterol (ADVAIR) 250-50 MCG/DOSE AEPB Inhale 1 puff into the lungs daily. 03/06/16  Yes Nathaniel Morgan, MD  meloxicam (MOBIC) 15 MG tablet Take 15 mg by mouth daily. 07/08/16  Yes Historical Provider, MD  metoprolol tartrate (LOPRESSOR) 25 MG tablet Take 1 tablet (25 mg total) by mouth 2 (two) times daily. Patient taking differently: Take 50 mg by mouth 2 (two) times daily.  01/17/16  Yes Leonie Man, MD  amoxicillin (AMOXIL) 500 MG capsule Take 2 capsules (1,000 mg total) by mouth 3 (three) times daily. 03/06/16   Nathaniel Morgan, MD  flecainide (  TAMBOCOR) 50 MG tablet Take 1 tablet (50 mg total) by mouth 2 (two) times daily. 08/23/15   Leonie Man, MD    Family History    Family History  Problem Relation Age of Onset  . Ovarian cancer Mother   . Coronary artery disease Father   . Colon cancer Neg Hx   . Stomach cancer Neg Hx   . Esophageal cancer Neg Hx   . Rectal cancer Neg Hx     Social History    Social History   Social History  . Marital Status: Married    Spouse Name: N/A  . Number of Children: 1  . Years of Education: college   Occupational History  . disability    Social History Main Topics  . Smoking status: Never Smoker   . Smokeless tobacco: Never Used  . Alcohol Use: 0.0 oz/week    0 Standard drinks or equivalent per week     Comment: occ  . Drug Use: No  . Sexual Activity: Not on file   Other Topics Concern  . Not on file   Social History Narrative   He is deaf, requiring an interpreter.   He started a new job back in 2013, which was much less stressful for him.  He has subsequently gone on to disability.   He is a married father of one. He previously wrote his bike 2 times a week.   Does not smoke or drink.           Review of Systems   Deaf Interpreter Used:   General:  No chills, fever, night sweats or weight changes.  Cardiovascular:  See HPI Dermatological: No rash,  lesions/masses Respiratory: No cough, dyspnea Urologic: No hematuria, dysuria Abdominal:   No nausea, vomiting, diarrhea, bright red blood per rectum, melena, or hematemesis Neurologic:  No visual changes, wkns, changes in mental status. All other systems reviewed and are otherwise negative except as noted above.  Physical Exam    Blood pressure 117/84, pulse 68, temperature 98.2 F (36.8 C), temperature source Oral, resp. rate 13, SpO2 96 %.  General: Pleasant, NAD Psych: Normal affect. Neuro: Alert and oriented X 3. Moves all extremities spontaneously. HEENT: Normal  Neck: Supple without bruits or JVD. Lungs:  Resp regular and unlabored, CTA. Heart: RRR no s3, s4, or murmurs. Abdomen: Soft, non-tender, non-distended, BS + x 4.  Extremities: No clubbing, cyanosis or edema. DP/PT/Radials 2+ and equal bilaterally.  Labs    Troponin [facility] of Care Test)  Recent Labs  07/09/16 1326  TROPIPOC 0.00   No results for input(s): CKTOTAL, CKMB, TROPONINI in the last 72 hours. Lab Results  Component Value Date   WBC 6.1 07/09/2016   HGB 13.5 07/09/2016   HCT 39.7 07/09/2016   MCV 91.1 07/09/2016   PLT 163 07/09/2016    Recent Labs Lab 07/09/16 1319  NA 137  K 3.7  CL 107  CO2 23  BUN 13  CREATININE 0.85  CALCIUM 8.6*  GLUCOSE 86   Lab Results  Component Value Date   CHOL 180 12/21/2012   HDL 36* 12/21/2012   LDLCALC 112* 12/21/2012   TRIG 160* 12/21/2012   Lab Results  Component Value Date   DDIMER 0.36 11/13/2015     Radiology Studies    Dg Chest 2 View  07/09/2016  CLINICAL DATA:  55 year old male with a history of midline chest pain. History of asthma EXAM: CHEST  2 VIEW COMPARISON:  03/31/2016, 03/06/2016 FINDINGS: Cardiomediastinal  silhouette unchanged. No evidence of central vascular congestion. No interlobular septal thickening. No confluent airspace disease, pneumothorax, or pleural effusion. Posttraumatic changes of the right scapula. No acute displaced  fracture. Configuration of thoracic vertebral bodies unchanged. IMPRESSION: Negative for acute cardiopulmonary disease. Signed, Dulcy Fanny. Earleen Newport, DO [facility] Radiology Electronically Signed   By: Corrie Mckusick D.O.   On: 07/09/2016 13:54    ECG & Cardiac Imaging    EKG: SR with no acute ST/ T wave abnormalities  ECHO: 05/2014  Study Conclusions  - Left ventricle: The cavity size was normal. Systolic function was normal. The estimated ejection fraction was in the range of 55% to 60%. Wall motion was normal; there were no regional wall motion abnormalities. Doppler parameters are consistent with abnormal left ventricular relaxation (grade 1 diastolic dysfunction). There was no evidence of elevated ventricular filling pressure by Doppler parameters. - Aortic valve: Trileaflet; normal thickness leaflets. There was no regurgitation. - Aortic root: The aortic root was normal in size. - Mitral valve: Structurally normal valve. There was mild regurgitation. - Left atrium: The atrium was mildly dilated. - Right ventricle: Systolic function was normal. - Right atrium: The atrium was normal in size. - Tricuspid valve: There was no regurgitation. - Pulmonic valve: There was trivial regurgitation. - Pulmonary arteries: Systolic pressure was within the normal range. - Inferior vena cava: The vessel was normal in size. The respirophasic diameter changes were in the normal range (= 50%), consistent with normal central venous pressure.  Impressions:  - Normal biventricular size and function. No regional wall motion abnormalities. Impaired relaxation. Normal filling pressures. No significant valvular abnormality.  Assessment & Plan    Mr. Volner is a 55 yo male with PMH of TIA/HLD/GERD/Deafness and wore a holter monitor (2015) with episodes of PAT/PSVT. He has been seen by Dr. Ellyn [name] in follow up of palpitations  and chest pain. Had a nuclear stress test in 2012 which was low risk. Also noted to have worn an event monitor back in 2015 showing only 2 short runs of PAT/PSVT. He was started on Flecainide in 8/16 for his PSVT. At his last office visit 1/17 he reported feeling well with no c/o chest pain, but occasional palpitations that are usually associated with missing doses of his metoprolol. No changes were made to his medications at that time.  1. Chest pain: Reports having 2 episodes of chest pain/pressure this morning. One was brief and resolved on its own, the other lasting a longer period of time and ultimately being relieved by 2 SL nitro and one dose of morphine. He has not had any recurrent episodes of chest pain since arriving to the ED. POC trop cycled x1 and negative. Chest x-ray neg. EKG showing SR with acute ST/ T wave abnormalities.  -- Has had similar episodes of pain in the past, along with periods of PSVT which he is currently taking metoprolol and flecainide (PRN). Denies having any palpitations today with episodes of chest pain, but did have some yesterday.   -- He does report being under increased personal stress related to issues with his son. Also stating he is having to provide care for his grandchildren as well. He does appear to be very concerned about the possibility of having a "heart attack" and what would happen to him and his family.  -- I think he would benefit from inpatient stress testing. Will make NPO tonight and schedule myoview tomorrow. -- Admit to telemetry to monitor for  arrhythmias    2. PSVT/PAT: No episodes of this noted on telemetry. He denies feeling any palpitations today with episodes of chest pain. EKG shows SR.  -- continue metoprolol  3. HLD: Statin listed as an allergy.  -- Check lipid panel given last check was 2013, and Hgb A1c for risk stratification.    Barnet Pall, NP-C Pager [phone] 07/09/2016, 4:04 PM

## 2016-07-09 NOTE — ED Provider Notes (Signed)
CSN: [id number]     Arrival date & time 07/09/16  1248 History   First MD Initiated Contact with Patient 07/09/16 1257     Chief Complaint  Patient presents with  . Chest Pain     (Consider location/radiation/quality/duration/timing/severity/associated sxs/prior Treatment) HPI Comments: Patient presents to the emergency department with chief complaint of chest pain. History is obtained via pen and paper. Patient is deaf. ASL translator is on their way. Patient states that he has had central chest pain since 12:00 this afternoon. He states that it feels like a pressure is on his chest. He denies any shortness of breath, diaphoresis, or radiating pain. He reports having a history of prior MI. He is followed by Dr. Ellyn Hack of cardiology. Patient was given 325 aspirin as well as nitroglycerin with improvement of pain from a 9/10 to a 3/10.  The history is provided by the patient. No language interpreter was used.    Past Medical History  Diagnosis Date  . Deaf     Call [phone] for patient's sign language interpreter.   . Barrett's esophagus 10-25-14 pt denies    states "minor"  . Asthma     prn inhaler  . TIA (transient ischemic attack) 10-25-14 pt denies    no current deficits  . Seasonal allergies   . High cholesterol   . Heartburn     occasional, related to certain foods  . Arthritis     right knee  . 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 Intermittent palpitations 10-25-14 patient denies    Event Monitor 04/2014: Mostly NSR - intermittent PACs, - 2 brief runs of PAT/PSVT  . Vasovagal syncope   . Tachycardia   . Allergy   . GERD (gastroesophageal reflux disease)   . Stroke [facility]) 2013    TIA   Past Surgical History  Procedure Laterality Date  . Bunionectomy Left 2012    right done also  . Knee arthroscopy Left 06/02/2012  . Knee arthroscopy Right fall 2013    x 2  . Inguinal hernia repair    . Cardiac catheterization  06/24/2011    wwidely patent normal coronary  arteries with normal ejection fraction  . Knee arthroscopy Left 03/03/2013    Procedure: ARTHROSCOPY KNEE WITH CHONDROPLASTY PATELLA  AND LATERAL TIBIAL PLATEAU;  Surgeon: Kerin Salen, MD;  Location: [place];  Service: Orthopedics;  Laterality: Left;  . Foot implant removal Right 07/15/2013    @ [place]  . Nm myoview ltd  2012    small fixed basal inferior artifact. Normal EF.  . Carotid artery dopplers  2012    tortuous but no stenoses.. No subclavian disease  . Upper and lower extremity arterial dopplers  2012    normal arterial flow bilateral upper extremities including subclavian is a carotid;;R. ABI 1.2, L. ABI 1.28. Normal flow velocities. Likely calcified vessels.  . Abdominal aortic ultrasound  2012    normal abdominal aorta  . Transthoracic echocardiogram  June2015    nnormal EF: 55-60%. No wall motion abnormalities. Gr 1 DD.  Mild MR. Otherwise normal  . Colonoscopy with propofol N/A 11/03/2014    Procedure: COLONOSCOPY WITH PROPOFOL;  Surgeon: Milus Banister, MD;  Location: [facility];  Service: Endoscopy;  Laterality: N/A;  . Colonoscopy  2015  . Upper gastrointestinal endoscopy  2013   Family History  Problem Relation Age of Onset  . Ovarian cancer Mother   . Coronary artery disease Father   . Colon cancer Neg  Hx   . Stomach cancer Neg Hx   . Esophageal cancer Neg Hx   . Rectal cancer Neg Hx    Social History  Substance Use Topics  . Smoking status: Never Smoker   . Smokeless tobacco: Never Used  . Alcohol Use: 0.0 oz/week    0 Standard drinks or equivalent per week     Comment: occ    Review of Systems  Cardiovascular: Positive for chest pain.  All other systems reviewed and are negative.     Allergies  Shrimp; Simvastatin; Tamsulosin; Fish allergy; and Fish-derived products  Home Medications   Prior to Admission medications   Medication Sig Start Date End Date Taking? Authorizing Provider  albuterol (PROVENTIL HFA;VENTOLIN HFA) 108 (90  BASE) MCG/ACT inhaler Inhale 1-2 puffs into the lungs every 6 (six) hours as needed for wheezing or shortness of breath. 04/16/15   Pamella Pert, MD  albuterol (PROVENTIL) (2.5 MG/3ML) 0.083% nebulizer solution Take 3 mLs (2.5 mg total) by nebulization every 4 (four) hours as needed for wheezing or shortness of breath. 03/06/16   Gareth Morgan, MD  amoxicillin (AMOXIL) 500 MG capsule Take 2 capsules (1,000 mg total) by mouth 3 (three) times daily. 03/06/16   Gareth Morgan, MD  aspirin EC 325 MG tablet Take 325 mg by mouth daily.    Historical Provider, MD  esomeprazole (NEXIUM) 40 MG capsule Take 1 capsule by mouth daily as needed. 03/22/15   Historical Provider, MD  fexofenadine (ALLEGRA) 180 MG tablet Take 180 mg by mouth daily.    Historical Provider, MD  flecainide (TAMBOCOR) 50 MG tablet Take 1 tablet (50 mg total) by mouth 2 (two) times daily. 08/23/15   Leonie Man, MD  fluticasone [facility]) 50 MCG/ACT nasal spray Place 1 spray into both nostrils daily. 02/04/14   Historical Provider, MD  Fluticasone-Salmeterol (ADVAIR) 250-50 MCG/DOSE AEPB Inhale 1 puff into the lungs daily. 03/06/16   Gareth Morgan, MD  HYDROcodone-acetaminophen (NORCO/VICODIN) 5-325 MG tablet  01/01/16   Historical Provider, MD  metoprolol tartrate (LOPRESSOR) 25 MG tablet Take 1 tablet (25 mg total) by mouth 2 (two) times daily. Patient taking differently: Take 50 mg by mouth 2 (two) times daily.  01/17/16   Leonie Man, MD  mometasone-formoterol (DULERA) 100-5 MCG/ACT AERO Inhale 1 puff into the lungs daily.    Historical Provider, MD   There were no vitals taken for this visit. Physical Exam  Constitutional: He is oriented to person, place, and time. He appears well-developed and well-nourished.  HENT:  Head: Normocephalic and atraumatic.  Eyes: Conjunctivae and EOM are normal. Pupils are equal, round, and reactive to light. Right eye exhibits no discharge. Left eye exhibits no discharge. No scleral icterus.   Neck: Normal range of motion. Neck supple. No JVD present.  Cardiovascular: Normal rate, regular rhythm and normal heart sounds.  Exam reveals no gallop and no friction rub.   No murmur heard. Pulmonary/Chest: Effort normal and breath sounds normal. No respiratory distress. He has no wheezes. He has no rales. He exhibits no tenderness.  Abdominal: Soft. He exhibits no distension and no mass. There is no tenderness. There is no rebound and no guarding.  Musculoskeletal: Normal range of motion. He exhibits no edema or tenderness.  Neurological: He is alert and oriented to person, place, and time.  Skin: Skin is warm and dry.  Psychiatric: He has a normal mood and affect. His behavior is normal. Judgment and thought content normal.  Nursing note and vitals reviewed.  ED Course  Procedures (including critical care time) Results for orders placed or performed during the hospital encounter of 07/09/16  CBC with Differential/Platelet  Result Value Ref Range   WBC 6.1 4.0 - 10.5 K/uL   RBC 4.36 4.22 - 5.81 MIL/uL   Hemoglobin 13.5 13.0 - 17.0 g/dL   HCT 39.7 39.0 - 52.0 %   MCV 91.1 78.0 - 100.0 fL   MCH 31.0 26.0 - 34.0 pg   MCHC 34.0 30.0 - 36.0 g/dL   RDW 12.7 11.5 - 15.5 %   Platelets 163 150 - 400 K/uL   Neutrophils Relative % 60 %   Neutro Abs 3.7 1.7 - 7.7 K/uL   Lymphocytes Relative 31 %   Lymphs Abs 1.9 0.7 - 4.0 K/uL   Monocytes Relative 6 %   Monocytes Absolute 0.4 0.1 - 1.0 K/uL   Eosinophils Relative 2 %   Eosinophils Absolute 0.1 0.0 - 0.7 K/uL   Basophils Relative 1 %   Basophils Absolute 0.0 0.0 - 0.1 K/uL  Basic metabolic panel  Result Value Ref Range   Sodium 137 135 - 145 mmol/L   Potassium 3.7 3.5 - 5.1 mmol/L   Chloride 107 101 - 111 mmol/L   CO2 23 22 - 32 mmol/L   Glucose, Bld 86 65 - 99 mg/dL   BUN 13 6 - 20 mg/dL   Creatinine, Ser 0.85 0.61 - 1.24 mg/dL   Calcium 8.6 (L) 8.9 - 10.3 mg/dL   GFR calc non Af Amer >60 >60 mL/min   GFR calc Af Amer >60  >60 mL/min   Anion gap 7 5 - 15  I-stat troponin, ED  Result Value Ref Range   Troponin i, poc 0.00 0.00 - 0.08 ng/mL   Comment 3           Dg Chest 2 View  07/09/2016  CLINICAL DATA:  55 year old male with a history of midline chest pain. History of asthma EXAM: CHEST  2 VIEW COMPARISON:  03/31/2016, 03/06/2016 FINDINGS: Cardiomediastinal silhouette unchanged. No evidence of central vascular congestion. No interlobular septal thickening. No confluent airspace disease, pneumothorax, or pleural effusion. Posttraumatic changes of the right scapula. No acute displaced fracture. Configuration of thoracic vertebral bodies unchanged. IMPRESSION: Negative for acute cardiopulmonary disease. Signed, Dulcy Fanny. Earleen Newport, DO [facility] Radiology Electronically Signed   By: Corrie Mckusick D.O.   On: 07/09/2016 13:54    I have personally reviewed and evaluated these images and lab results as part of my medical decision-making.   EKG Interpretation   Date/Time:  Tuesday July 09 2016 14:08:55 EDT Ventricular Rate:  58 PR Interval:    QRS Duration: 91 QT Interval:  419 QTC Calculation: 412 R Axis:   19 Text Interpretation:  Sinus rhythm No significant change since last  tracing Confirmed by Ashok Cordia  MD, Lennette Bihari ([id number]) on 07/09/2016 2:11:13 PM  Also confirmed by Ashok Cordia  MD, Lennette Bihari ([id number]), editor Gilford Rile, CCT, [place]  ([id number])  on 07/09/2016 2:22:52 PM      MDM   Final diagnoses:  Chest pain, unspecified chest pain type   Patient with chest pain.  Prior MI.  Onset 12:00.  Described as a pressure.  Relieved with nitro and aspirin.  Given morphine in ED.  CP free now.  Trop negative, no new ischemic EKG changes.   Cardiology consulted, will give recommendations vs admit.        Montine Circle, PA-C 07/10/16 [id number]  Lajean Saver, MD 07/12/16 1556

## 2016-07-10 ENCOUNTER — Observation Stay (HOSPITAL_COMMUNITY): Payer: Medicare Other

## 2016-07-10 ENCOUNTER — Observation Stay (HOSPITAL_BASED_OUTPATIENT_CLINIC_OR_DEPARTMENT_OTHER): Payer: Medicare Other

## 2016-07-10 DIAGNOSIS — I471 Supraventricular tachycardia: Secondary | ICD-10-CM | POA: Diagnosis not present

## 2016-07-10 DIAGNOSIS — E785 Hyperlipidemia, unspecified: Secondary | ICD-10-CM | POA: Diagnosis not present

## 2016-07-10 DIAGNOSIS — R079 Chest pain, unspecified: Secondary | ICD-10-CM

## 2016-07-10 DIAGNOSIS — J45909 Unspecified asthma, uncomplicated: Secondary | ICD-10-CM | POA: Diagnosis not present

## 2016-07-10 DIAGNOSIS — R0789 Other chest pain: Secondary | ICD-10-CM | POA: Diagnosis not present

## 2016-07-10 DIAGNOSIS — Z79899 Other long term (current) drug therapy: Secondary | ICD-10-CM | POA: Diagnosis not present

## 2016-07-10 DIAGNOSIS — Z7982 Long term (current) use of aspirin: Secondary | ICD-10-CM | POA: Diagnosis not present

## 2016-07-10 LAB — BASIC METABOLIC PANEL
ANION GAP: 6 (ref 5–15)
BUN: 13 mg/dL (ref 6–20)
CALCIUM: 8.7 mg/dL — AB (ref 8.9–10.3)
CO2: 25 mmol/L (ref 22–32)
Chloride: 108 mmol/L (ref 101–111)
Creatinine, Ser: 0.91 mg/dL (ref 0.61–1.24)
GLUCOSE: 100 mg/dL — AB (ref 65–99)
POTASSIUM: 4.3 mmol/L (ref 3.5–5.1)
Sodium: 139 mmol/L (ref 135–145)

## 2016-07-10 LAB — LIPID PANEL
CHOL/HDL RATIO: 6.4 ratio
Cholesterol: 166 mg/dL (ref 0–200)
HDL: 26 mg/dL — AB (ref >40)
LDL CALC: 104 mg/dL — AB (ref 0–99)
TRIGLYCERIDES: 180 mg/dL — AB (ref <150)
VLDL: 36 mg/dL (ref 0–40)

## 2016-07-10 LAB — NM MYOCAR MULTI W/SPECT W/WALL MOTION / EF
CHL CUP MPHR: 165 {beats}/min
CHL CUP RESTING HR STRESS: 56 {beats}/min
Estimated workload: 1 METS
Peak HR: 98 {beats}/min
Percent HR: 59 %

## 2016-07-10 LAB — HEMOGLOBIN A1C
HEMOGLOBIN A1C: 5.6 % (ref 4.8–5.6)
MEAN PLASMA GLUCOSE: 114 mg/dL

## 2016-07-10 LAB — TROPONIN I: Troponin I: <0.03 ng/mL (ref <0.03)

## 2016-07-10 MED ORDER — REGADENOSON 0.4 MG/5ML IV SOLN
0.4000 mg | Freq: Once | INTRAVENOUS | Status: AC
  Administered 2016-07-10: 0.4 mg via INTRAVENOUS
  Filled 2016-07-10: qty 5

## 2016-07-10 MED ORDER — REGADENOSON 0.4 MG/5ML IV SOLN
INTRAVENOUS | Status: AC
  Filled 2016-07-10: qty 5

## 2016-07-10 MED ORDER — TECHNETIUM TC 99M TETROFOSMIN IV KIT
30.0000 | PACK | Freq: Once | INTRAVENOUS | Status: AC | PRN
  Administered 2016-07-10: 30 via INTRAVENOUS

## 2016-07-10 MED ORDER — METOPROLOL TARTRATE 25 MG PO TABS
25.0000 mg | ORAL_TABLET | Freq: Two times a day (BID) | ORAL | Status: DC
  Administered 2016-07-10: 25 mg via ORAL
  Filled 2016-07-10: qty 1

## 2016-07-10 MED ORDER — TECHNETIUM TC 99M TETROFOSMIN IV KIT
10.0000 | PACK | Freq: Once | INTRAVENOUS | Status: AC | PRN
  Administered 2016-07-10: 10 via INTRAVENOUS

## 2016-07-10 MED ORDER — SODIUM CHLORIDE 0.9 % IJ SOLN
80.0000 mg | INTRAVENOUS | Status: AC
  Administered 2016-07-10: 80 mg via INTRAVENOUS
  Filled 2016-07-10: qty 3.2

## 2016-07-10 MED ORDER — FLECAINIDE ACETATE 50 MG PO TABS: 50.0000 mg | ORAL_TABLET | Freq: Every day | ORAL | Status: DC | PRN

## 2016-07-10 MED ORDER — METOPROLOL TARTRATE 25 MG PO TABS: 25.0000 mg | ORAL_TABLET | Freq: Two times a day (BID) | ORAL | Status: DC

## 2016-07-10 NOTE — Discharge Instructions (Signed)
THE ONLY CHANGE TO YOUR MEDICATIONS THIS ADMISSION IS THE LOPRESSOR (METOPROLOL) DOSE WAS DECREASED FROM 50mg  BID TO 25mg  BID DUE TO YOUR HEART RATE AND BLOOD PRESSURE BEING ON THE LOW SIDE.  Heart-Healthy Eating Plan Many factors influence your heart health, including eating and exercise habits. Heart (coronary) risk increases with abnormal blood fat (lipid) levels. Heart-healthy meal planning includes limiting unhealthy fats, increasing healthy fats, and making other small dietary changes. This includes maintaining a healthy body weight to help keep lipid levels within a normal range. WHAT IS MY PLAN?  Your health care provider recommends that you:  Get no more than _________% of the total calories in your daily diet from fat.  Limit your intake of saturated fat to less than _________% of your total calories each day.  Limit the amount of cholesterol in your diet to less than _________ mg per day. WHAT TYPES OF FAT SHOULD I CHOOSE?  Choose healthy fats more often. Choose monounsaturated and polyunsaturated fats, such as olive oil and canola oil, flaxseeds, walnuts, almonds, and seeds.  Eat more omega-3 fats. Good choices include salmon, mackerel, sardines, tuna, flaxseed oil, and ground flaxseeds. Aim to eat fish at least two times each week.  Limit saturated fats. Saturated fats are primarily found in animal products, such as meats, butter, and cream. Plant sources of saturated fats include palm oil, palm kernel oil, and coconut oil.  Avoid foods with partially hydrogenated oils in them. These contain trans fats. Examples of foods that contain trans fats are stick margarine, some tub margarines, cookies, crackers, and other baked goods. WHAT GENERAL GUIDELINES DO I NEED TO FOLLOW?  Check food labels carefully to identify foods with trans fats or high amounts of saturated fat.  Fill one half of your plate with vegetables and green salads. Eat 4-5 servings of vegetables per day. A serving  of vegetables equals 1 cup of raw leafy vegetables,  cup of raw or cooked cut-up vegetables, or  cup of vegetable juice.  Fill one fourth of your plate with whole grains. Look for the word "whole" as the first word in the ingredient list.  Fill one fourth of your plate with lean protein foods.  Eat 4-5 servings of fruit per day. A serving of fruit equals one medium whole fruit,  cup of dried fruit,  cup of fresh, frozen, or canned fruit, or  cup of 100% fruit juice.  Eat more foods that contain soluble fiber. Examples of foods that contain this type of fiber are apples, broccoli, carrots, beans, peas, and barley. Aim to get 20-30 g of fiber per day.  Eat more home-cooked food and less restaurant, buffet, and fast food.  Limit or avoid alcohol.  Limit foods that are high in starch and sugar.  Avoid fried foods.  Cook foods by using methods other than frying. Baking, boiling, grilling, and broiling are all great options. Other fat-reducing suggestions include:  Removing the skin from poultry.  Removing all visible fats from meats.  Skimming the fat off of stews, soups, and gravies before serving them.  Steaming vegetables in water or broth.  Lose weight if you are overweight. Losing just 5-10% of your initial body weight can help your overall health and prevent diseases such as diabetes and heart disease.  Increase your consumption of nuts, legumes, and seeds to 4-5 servings per week. One serving of dried beans or legumes equals  cup after being cooked, one serving of nuts equals 1 ounces, and one serving  of seeds equals  ounce or 1 tablespoon.  You may need to monitor your salt (sodium) intake, especially if you have high blood pressure. Talk with your health care provider or dietitian to get more information about reducing sodium. WHAT FOODS CAN I EAT? Grains Breads, including [place], white, pita, wheat, raisin, rye, oatmeal, and [place]. Tortillas that are neither fried  nor made with lard or trans fat. Low-fat rolls, including hotdog and hamburger buns and English muffins. Biscuits. Muffins. Waffles. Pancakes. Light popcorn. Whole-grain cereals. Flatbread. Melba toast. Pretzels. Breadsticks. Rusks. Low-fat snacks and crackers, including oyster, saltine, matzo, graham, animal, and rye. Rice and pasta, including brown rice and those that are made with whole wheat. Vegetables All vegetables. Fruits All fruits, but limit coconut. Meats and Other Protein Sources Lean, well-trimmed beef, veal, pork, and lamb. Chicken and [place] without skin. All fish and shellfish. Wild duck, rabbit, pheasant, and venison. Egg whites or low-cholesterol egg substitutes. Dried beans, peas, lentils, and tofu.Seeds and most nuts. Dairy Low-fat or nonfat cheeses, including ricotta, string, and mozzarella. Skim or 1% milk that is liquid, powdered, or evaporated. Buttermilk that is made with low-fat milk. Nonfat or low-fat yogurt. Beverages Mineral water. Diet carbonated beverages. Sweets and Desserts Sherbets and fruit ices. Honey, jam, marmalade, jelly, and syrups. Meringues and gelatins. Pure sugar candy, such as hard candy, jelly beans, gumdrops, mints, marshmallows, and small amounts of dark chocolate. [employer] Eat all sweets and desserts in moderation. Fats and Oils Nonhydrogenated (trans-free) margarines. Vegetable oils, including soybean, sesame, sunflower, olive, peanut, safflower, corn, canola, and cottonseed. Salad dressings or mayonnaise that are made with a vegetable oil. Limit added fats and oils that you use for cooking, baking, salads, and as spreads. Other Cocoa powder. Coffee and tea. All seasonings and condiments. The items listed above may not be a complete list of recommended foods or beverages. Contact your dietitian for more options. WHAT FOODS ARE NOT RECOMMENDED? Grains Breads that are made with saturated or trans fats, oils, or whole milk. Croissants.  Butter rolls. Cheese breads. Sweet rolls. Donuts. Buttered popcorn. Chow mein noodles. High-fat crackers, such as cheese or butter crackers. Meats and Other Protein Sources Fatty meats, such as hotdogs, short ribs, sausage, spareribs, bacon, ribeye roast or steak, and mutton. High-fat deli meats, such as salami and bologna. Caviar. Domestic duck and goose. Organ meats, such as kidney, liver, sweetbreads, brains, gizzard, chitterlings, and heart. Dairy Cream, sour cream, cream cheese, and creamed cottage cheese. Whole milk cheeses, including blue (bleu), Monte[place] Jack, [place], [place], American, [place], Swiss, [place], [place], and [place]. Whole or 2% milk that is liquid, evaporated, or condensed. Whole buttermilk. Cream sauce or high-fat cheese sauce. Yogurt that is made from whole milk. Beverages Regular sodas and drinks with added sugar. Sweets and Desserts Frosting. Pudding. Cookies. Cakes other than angel food cake. Candy that has milk chocolate or white chocolate, hydrogenated fat, butter, coconut, or unknown ingredients. Buttered syrups. Full-fat ice cream or ice cream drinks. Fats and Oils Gravy that has suet, meat fat, or shortening. Cocoa butter, hydrogenated oils, palm oil, coconut oil, palm kernel oil. These can often be found in baked products, candy, fried foods, nondairy creamers, and whipped toppings. Solid fats and shortenings, including bacon fat, salt pork, lard, and butter. Nondairy cream substitutes, such as coffee creamers and sour cream substitutes. Salad dressings that are made of unknown oils, cheese, or sour cream. The items listed above may not be a complete list of foods and beverages to avoid.  Contact your dietitian for more information.   This information is not intended to replace advice given to you by your health care provider. Make sure you discuss any questions you have with your health care provider.   Document Released: 09/24/2008 Document Revised: 01/06/2015  Document Reviewed: 06/09/2014 Elsevier Interactive Patient Education [employer].

## 2016-07-10 NOTE — Discharge Summary (Signed)
Discharge Summary    Patient ID: Nathaniel [name],  MRN: [id number], DOB/AGE: 1961-04-07 55 y.o.  Admit date: 07/09/2016 Discharge date: 07/10/2016  Primary Care Provider: Helane Rima Primary Cardiologist: Dr. Ellyn Hack  Discharge Diagnoses    Active Problems:   CVA (cerebral infarction)   Hyperlipidemia   Palpitations   PVC (premature ventricular contraction)   Chest pain   PSVT (paroxysmal supraventricular tachycardia) (HCC)   Chest pain of uncertain etiology   Pain in the chest   History of Present Illness     Nathaniel [name] is a 55 y.o. male with past medical history of TIA, HLD, GERD, Deafness and episodes of PAT/PSVT (noted with Holter monitor in 2015) who presented to the [facility] ED on 07/09/2016 for evaluation of chest pain.   He reported being in his normal state of health until around 7am on 7/11 when he began not feeling well. States he proceeded to take his medication, ate breakfast and continued to work. 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 He was given a chair by an employee at the store and took 81mg  ASA. Reports the pain subsided, and he then proceeded to walk to the car. He then experienced another episode of chest pain which was sharp and stabbing in nature. Also felt weak in his legs at that time.   En route to the ED, he was given 2 SL NTG with helped reduce the pain from a 9/10 to a 3/10. Denies any palpitations, nausea, dyspnea, or lower extremity edema. On a daily basis he is able to perform activities of daily living without episodes of chest pain or dyspnea on exertion.   In the ED he was given one dose of morphine which relieved his chest pain completely. His labs showed Creatinine of 0.85, neg i-stat troponin, Hgb 13.5, and negative chest x-ray. EKG showed NSR with no acute ST or T-wave changes. He was admitted for ACS rule-out and a stress test was ordered for  the following morning.   Hospital Course     Consultants: None  Overnight, he denied any episodes of chest discomfort or dyspnea. Cyclic troponin values remained negative and a repeat EKG showed no acute ischemic changes. No episodes of PSVT or PAT were noted on telemetry. He was bradycardiac at times with HR in the mid-50's and SBP in the 90's, therefore his Lopressor dosing was decreased from 50mg  BID to 25mg  BID.  Stress testing was performed later that day and showed no evidence of reversible ischemia or infarction. His EF was estimated at 52% and the overall test was low-risk. Test results were reviewed with the patient via writing and his son was notified as well per the patient's wishes. He was last examined by Dr. Oval Linsey and deemed stable for discharge. He will continue to have regular Cardiology follow-up with Dr. Ellyn Hack.  _____________  Discharge Vitals Blood pressure 132/89, pulse 71, temperature 97.5 F (36.4 C), temperature source Oral, resp. rate 14, height 5\' 11"  (1.803 m), weight 167 lb 4.8 oz (75.887 kg), SpO2 99 %.  Filed Weights   07/09/16 2000 07/10/16 0535  Weight: 169 lb 6.4 oz (76.839 kg) 167 lb 4.8 oz (75.887 kg)    Labs & Radiologic Studies     CBC  Recent Labs  07/09/16 1319  WBC 6.1  NEUTROABS 3.7  HGB 13.5  HCT 39.7  MCV 91.1  PLT 163  Basic Metabolic Panel  Recent Labs  07/09/16 1319 07/10/16 0750  NA 137 139  K 3.7 4.3  CL 107 108  CO2 23 25  GLUCOSE 86 100*  BUN 13 13  CREATININE 0.85 0.91  CALCIUM 8.6* 8.7*   Liver Function Tests No results for input(s): AST, ALT, ALKPHOS, BILITOT, PROT, ALBUMIN in the last 72 hours. No results for input(s): LIPASE, AMYLASE in the last 72 hours. Cardiac Enzymes  Recent Labs  07/09/16 2018 07/10/16 0158 07/10/16 0750  TROPONINI <0.03 <0.03 <0.03   BNP Invalid input(s): POCBNP D-Dimer No results for input(s): DDIMER in the last 72 hours. Hemoglobin A1C No results for input(s): HGBA1C  in the last 72 hours. Fasting Lipid Panel  Recent Labs  07/10/16 0158  CHOL 166  HDL 26*  LDLCALC 104*  TRIG 180*  CHOLHDL 6.4   Thyroid Function Tests No results for input(s): TSH, T4TOTAL, T3FREE, THYROIDAB in the last 72 hours.  Invalid input(s): FREET3  Dg Chest 2 View: 07/09/2016  CLINICAL DATA:  55 year old male with a history of midline chest pain. History of asthma EXAM: CHEST  2 VIEW COMPARISON:  03/31/2016, 03/06/2016 FINDINGS: Cardiomediastinal silhouette unchanged. No evidence of central vascular congestion. No interlobular septal thickening. No confluent airspace disease, pneumothorax, or pleural effusion. Posttraumatic changes of the right scapula. No acute displaced fracture. Configuration of thoracic vertebral bodies unchanged. IMPRESSION: Negative for acute cardiopulmonary disease. Signed, Dulcy Fanny. Earleen Newport, DO [facility] Radiology Electronically Signed   By: Corrie Mckusick D.O.   On: 07/09/2016 13:54   Nm Myocar Multi W/spect W/wall Motion / Ef: 07/10/2016  CLINICAL DATA:  Chest pain, palpitations EXAM: MYOCARDIAL IMAGING WITH SPECT (REST AND PHARMACOLOGIC-STRESS) GATED LEFT VENTRICULAR WALL MOTION STUDY LEFT VENTRICULAR EJECTION FRACTION TECHNIQUE: Standard myocardial SPECT imaging was performed after resting intravenous injection of 10 mCi Tc-20m sestamibi. Subsequently, intravenous infusion of Lexiscan was performed under the supervision of the Cardiology staff. At peak effect of the drug, 30 mCi Tc-27m sestamibi was injected intravenously and standard myocardial SPECT imaging was performed. Quantitative gated imaging was also performed to evaluate left ventricular wall motion, and estimate left ventricular ejection fraction. COMPARISON:  None. FINDINGS: Perfusion: No decreased activity in the left ventricle on stress imaging to suggest reversible ischemia or infarction. Wall Motion: Normal left ventricular wall motion. No left  ventricular dilation. Left Ventricular Ejection Fraction: 52 % End diastolic volume 99991111 ml End systolic volume 48 ml IMPRESSION: 1. No reversible ischemia or infarction. 2. Normal left ventricular wall motion. 3. Left ventricular ejection fraction 52% 4. Low-risk stress test findings*. *2012 Appropriate Use Criteria for Coronary Revascularization Focused Update: J Am Coll Cardiol. N6492421. [email or web address] Electronically Signed   By: Lucrezia Europe M.D.   On: 07/10/2016 15:07   Diagnostic Studies/Procedures    None Performed.  Disposition   Pt is being discharged home today in good condition.  Follow-up Plans & Appointments        Follow-up Information    Follow up with Glenetta Hew, MD.   Specialty:  Cardiology   Why:  Continue routine follow-up with Cardiology. Please call if needing to be seen prior to your annual appointment.   Contact information:   [address] [place] [address] [phone]        Discharge Medications     Medication List    STOP taking these medications        amoxicillin 500 MG capsule  Commonly known as:  AMOXIL  TAKE these medications        albuterol 108 (90 Base) MCG/ACT inhaler  Commonly known as:  PROVENTIL HFA;VENTOLIN HFA  Inhale 1-2 puffs into the lungs every 6 (six) hours as needed for wheezing or shortness of breath.     albuterol (2.5 MG/3ML) 0.083% nebulizer solution  Commonly known as:  PROVENTIL  Take 3 mLs (2.5 mg total) by nebulization every 4 (four) hours as needed for wheezing or shortness of breath.     aspirin EC 81 MG tablet  Take 81 mg by mouth once.     esomeprazole 40 MG capsule  Commonly known as:  NEXIUM  Take 1 capsule by mouth daily as needed (for heartburn).     fexofenadine 180 MG tablet  Commonly known as:  ALLEGRA  Take 180 mg by mouth daily.     flecainide 50 MG tablet  Commonly known as:  TAMBOCOR  Take 1 tablet (50 mg  total) by mouth daily as needed.     fluticasone 50 MCG/ACT nasal spray  Commonly known as:  FLONASE  Place 1 spray into both nostrils daily.     Fluticasone-Salmeterol 250-50 MCG/DOSE Aepb  Commonly known as:  ADVAIR  Inhale 1 puff into the lungs daily.     meloxicam 15 MG tablet  Commonly known as:  MOBIC  Take 15 mg by mouth daily.     metoprolol tartrate 25 MG tablet  Commonly known as:  LOPRESSOR  Take 1 tablet (25 mg total) by mouth 2 (two) times daily.          Allergies Allergies  Allergen Reactions  . Shrimp [Shellfish Allergy] Rash and Itching  . Simvastatin Other (See Comments)    DEPRESSION, VISUAL DISTURBANCE(FLASHING LIGHTS)  . Tamsulosin Other (See Comments)    Changes in behavior   . Fish Allergy Rash    SOFTSHELL FISH  . Fish-Derived Products Rash    SOFTSHELL FISH     Outstanding Labs/Studies   None  Duration of Discharge Encounter   Greater than 30 minutes including physician time.  Signed, Erma Heritage, PA-C 07/10/2016, 3:49 PM

## 2016-07-10 NOTE — Progress Notes (Signed)
Hospital Problem List     Active Problems:   CVA (cerebral infarction)   Hyperlipidemia   Palpitations   PVC (premature ventricular contraction)   Chest pain   PSVT (paroxysmal supraventricular tachycardia) (HCC)   Chest pain of uncertain etiology     Patient Profile:   Primary Cardiologist: Dr. Ellyn Hack  55 yo male w/ PMH of TIA, HLD, GERD, Deafness and episodes of PAT/PSVT (noted with Holter monitor in 2015) who presented to the [facility] ED on 07/09/2016 for evaluation of chest pain.   Subjective   Language Interpreter Used.   Denies any repeat episodes of chest discomfort since being admitted. NPO since midnight for NST today.   Inpatient Medications    . aspirin EC  81 mg Oral Daily  . heparin  5,000 Units Subcutaneous Q8H  . metoprolol tartrate  50 mg Oral BID  . pantoprazole  40 mg Oral Daily    Vital Signs    Filed Vitals:   07/09/16 1936 07/09/16 2000 07/10/16 0535 07/10/16 0617  BP: 130/83 130/83 88/75 109/74  Pulse: 68 76 68 53  Temp:  98.7 F (37.1 C) 97.9 F (36.6 C)   TempSrc:  Oral Oral   Resp: 18 14 10 14   Height:  5\' 11"  (1.803 m)    Weight:  169 lb 6.4 oz (76.839 kg) 167 lb 4.8 oz (75.887 kg)   SpO2: 97% 96% 97% 98%   No intake or output data in the 24 hours ending 07/10/16 0744 Filed Weights   07/09/16 2000 07/10/16 0535  Weight: 169 lb 6.4 oz (76.839 kg) 167 lb 4.8 oz (75.887 kg)    Physical Exam    General: Well developed, well nourished, male appearing in no acute distress. Head: Normocephalic, atraumatic.  Neck: Supple without bruits, JVD not elevated. Lungs:  Resp regular and unlabored, CTA without wheezing or rales. Heart: RRR, S1, S2, no S3, S4, or murmur; no rub. Abdomen: Soft, non-tender, non-distended with normoactive bowel sounds. No hepatomegaly. No rebound/guarding. No obvious abdominal masses. Extremities: No clubbing, cyanosis, or edema. Distal pedal pulses are 2+ bilaterally. Neuro: Alert and oriented X 3. Moves  all extremities spontaneously. Psych: Normal affect.  Labs    CBC  Recent Labs  07/09/16 1319  WBC 6.1  NEUTROABS 3.7  HGB 13.5  HCT 39.7  MCV 91.1  PLT [id number]   Basic Metabolic Panel  Recent Labs  07/09/16 1319  NA 137  K 3.7  CL 107  CO2 23  GLUCOSE 86  BUN 13  CREATININE 0.85  CALCIUM 8.6*   Cardiac Enzymes  Recent Labs  07/09/16 2018 07/10/16 0158  TROPONINI <0.03 <0.03   Fasting Lipid Panel  Recent Labs  07/10/16 0158  CHOL 166  HDL 26*  LDLCALC 104*  TRIG 180*  CHOLHDL 6.4     Telemetry    NSR, HR in mid-50's - 60's. No episodes of PSVT or PAT noted.   ECG    Sinus bradycardia, HR 59, with no acute ST or T-wave changes.   Cardiac Studies and Radiology    Dg Chest 2 View: 07/09/2016  CLINICAL DATA:  55 year old male with a history of midline chest pain. History of asthma EXAM: CHEST  2 VIEW COMPARISON:  03/31/2016, 03/06/2016 FINDINGS: Cardiomediastinal silhouette unchanged. No evidence of central vascular congestion. No interlobular septal thickening. No confluent airspace disease, pneumothorax, or pleural effusion. Posttraumatic changes of the right scapula. No acute displaced fracture. Configuration of thoracic vertebral bodies unchanged. IMPRESSION: Negative  for acute cardiopulmonary disease. Signed, Dulcy Fanny. Earleen Newport, DO [facility] Radiology Electronically Signed   By: Corrie Mckusick D.O.   On: 07/09/2016 13:54   Assessment & Plan    1. Chest pain - reported having 2 episodes of chest pain/pressure on 07/09/2016, one was brief and resolved on its own, the other lasting a longer period of time and ultimately being relieved by 2 SL NTG and one dose of morphine. Pain similar to episodes of PSVT in the past.   - cyclic troponin values have been negative. EKG without acute ischemic changes.  - for Lexiscan Myoview this AM. Has been NPO since midnight. If low-risk, likely stable for discharge later  today.   2. PSVT/PAT - No episodes of this noted on telemetry. Denies any recent palpitations.  - continue BB. Hold AM dose until stress test performed.   3. HLD - LDL 104 this admission. Statin listed as an allergy.   Signed, Erma Heritage , PA-C 7:44 AM 07/10/2016 Pager: [phone]

## 2016-07-10 NOTE — Plan of Care (Signed)
Problem: Phase II Progression Outcomes Goal: Stress Test if indicated Outcome: Progressing Patient admitted with chest pain/pressure, characterized as "elephant sitting on chest," non-radiating with diaphoresis. No other associated symptoms reported. Chest pain was resolved in ER after SL nitroglycerin and morphine 4 mg IV x 1 dose. Chest pain free on arrival to unit.  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 Interpreter stayed to assist with intake assessment. Education is progressing via pen and paper and patient handouts for the time being.  Patient understands that he will be undergoing stress testing in the morning; basic education provided. Also reviewed medications and medical history with patient.  Will continue to follow for needs.

## 2016-07-10 NOTE — Care Management Obs Status (Signed)
[place] NOTIFICATION   Patient Details  Name: Nathaniel [name] MRN: [id number] Date of Birth: Jun 12, 1961   Medicare Observation Status Notification Given:  Yes    Bethena Roys, RN 07/10/2016, 2:16 PM

## 2016-08-15 ENCOUNTER — Ambulatory Visit (INDEPENDENT_AMBULATORY_CARE_PROVIDER_SITE_OTHER): Payer: Medicare Other | Admitting: Nurse Practitioner

## 2016-08-15 ENCOUNTER — Encounter: Payer: Self-pay | Admitting: Nurse Practitioner

## 2016-08-15 VITALS — BP 130/80 | HR 70 | Ht 71.0 in | Wt 172.8 lb

## 2016-08-15 DIAGNOSIS — E785 Hyperlipidemia, unspecified: Secondary | ICD-10-CM

## 2016-08-15 DIAGNOSIS — R002 Palpitations: Secondary | ICD-10-CM | POA: Diagnosis not present

## 2016-08-15 DIAGNOSIS — E7800 Pure hypercholesterolemia, unspecified: Secondary | ICD-10-CM | POA: Insufficient documentation

## 2016-08-15 DIAGNOSIS — R079 Chest pain, unspecified: Secondary | ICD-10-CM | POA: Diagnosis not present

## 2016-08-15 DIAGNOSIS — I471 Supraventricular tachycardia: Secondary | ICD-10-CM

## 2016-08-15 NOTE — Patient Instructions (Signed)
,  Medication Instructions:  Your physician recommends that you continue on your current medications as directed. Please refer to the Current Medication list given to you today.   Labwork: None ordered  Testing/Procedures: None ordered  Follow-Up: Your physician wants you to follow-up in: [place] [address]. HARDING   You will receive a reminder letter in the mail two months in advance. If you don't receive a letter, please call our office to schedule the follow-up appointment.   Any Other Special Instructions Will Be Listed Below (If Applicable).    If you need a refill on your cardiac medications before your next appointment, please call your pharmacy.

## 2016-08-15 NOTE — Progress Notes (Signed)
Office Visit    Patient Name: Nathaniel [name] Date of Encounter: 08/15/2016  Primary Care Provider:  Helane Rima, MD Primary Cardiologist:  Roni Bread, MD   Chief Complaint    55 y/o ? with a h/o palpitations/PAT/PSVT, vasovagal syncope, and chest pain s/p recent hospitalization and neg MV, who presents for f/u.  Past Medical History    Past Medical History:  Diagnosis Date  . Allergy   . Arthritis    right knee  . Asthma    prn inhaler  . Barrett's esophagus 10-25-14 pt denies   states "minor"  . Chest pain    a. 06/2016 MV: EF 52%, no ischemia or infarct.  . 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 Deaf    Call [phone] for patient's sign language interpreter.   Marland Kitchen GERD (gastroesophageal reflux disease)   . Heartburn    occasional, related to certain foods  . High cholesterol   . Intermittent palpitations 10-25-14 patient denies   a. Event Monitor 04/2014: Mostly NSR - intermittent PACs, - 2 brief runs of PAT/PSVT  . PSVT (paroxysmal supraventricular tachycardia) ([place])    a. 04/2014 Event Monitor: brief PAT/PSVT-->uses prn flecainide.  . Seasonal allergies   . Stroke [facility]) 2013   TIA  . TIA (transient ischemic attack) 10-25-14 pt denies   no current deficits  . Vasovagal syncope    Past Surgical History:  Procedure Laterality Date  . Abdominal Aortic Ultrasound  2012   normal abdominal aorta  . BUNIONECTOMY Left 2012   right done also  . CARDIAC CATHETERIZATION  06/24/2011   wwidely patent normal coronary arteries with normal ejection fraction  . Carotid Artery Dopplers  2012   tortuous but no stenoses.. No subclavian disease  . COLONOSCOPY  2015  . COLONOSCOPY WITH PROPOFOL N/A 11/03/2014   Procedure: COLONOSCOPY WITH PROPOFOL;  Surgeon: Milus Banister, MD;  Location: [facility];  Service: Endoscopy;  Laterality: N/A;  . Foot Implant Removal Right 07/15/2013   @ [place]  . INGUINAL HERNIA REPAIR    . KNEE ARTHROSCOPY Left 06/02/2012  . KNEE ARTHROSCOPY  Right fall 2013   x 2  . KNEE ARTHROSCOPY Left 03/03/2013   Procedure: ARTHROSCOPY KNEE WITH CHONDROPLASTY PATELLA  AND LATERAL TIBIAL PLATEAU;  Surgeon: Kerin Salen, MD;  Location: [place];  Service: Orthopedics;  Laterality: Left;  . NM MYOVIEW LTD  2012   small fixed basal inferior artifact. Normal EF.  Marland Kitchen TRANSTHORACIC ECHOCARDIOGRAM  June2015   nnormal EF: 55-60%. No wall motion abnormalities. Gr 1 DD.  Mild MR. Otherwise normal  . Upper and Lower Extremity Arterial Dopplers  2012   normal arterial flow bilateral upper extremities including subclavian is a carotid;;R. ABI 1.2, L. ABI 1.28. Normal flow velocities. Likely calcified vessels.  Marland Kitchen UPPER GASTROINTESTINAL ENDOSCOPY  2013    Allergies  Allergies  Allergen Reactions  . Shellfish-Derived Products Itching and Rash  . Shrimp [Shellfish Allergy] Rash and Itching  . Simvastatin Other (See Comments)    DEPRESSION, VISUAL DISTURBANCE(FLASHING LIGHTS)  . Tamsulosin Other (See Comments)    Other reaction(s): Other (See Comments) Changes in behavior  Changes in behavior  Changes in behavior   . Fish Allergy Rash    SOFTSHELL FISH  . Fish-Derived Products Rash    SOFTSHELL FISH SOFTSHELL FISH    History of Present Illness    55 year old male with prior history of palpitations and documented PAT/PSVT by event monitor in 2015. He is  on chronic beta blocker therapy and also uses flecainide when necessary for palpitations about twice a month. He also has a history of syncope, which has previously been felt to be secondary to dehydration and possibly vasovagal syncope. He was recently admitted to [facility] in the setting of chest pain. He ruled out subsequently underwent Myoview testing, which was negative for ischemia. Since his hospitalization, he has done well without any recurrent chest pain. He is walking regularly without significant dyspnea or limitations. He is also careful with his diet and he and his wife  prepares most of their meals at home. He continues to have occasional palpitations, though he has not noticed any significant increase in the burden.  Home Medications    Prior to Admission medications   Medication Sig Start Date End Date Taking? Authorizing Provider  albuterol (PROVENTIL HFA;VENTOLIN HFA) 108 (90 BASE) MCG/ACT inhaler Inhale 1-2 puffs into the lungs every 6 (six) hours as needed for wheezing or shortness of breath. 04/16/15  Yes Pamella Pert, MD  albuterol (PROVENTIL) (2.5 MG/3ML) 0.083% nebulizer solution Take 3 mLs (2.5 mg total) by nebulization every 4 (four) hours as needed for wheezing or shortness of breath. 03/06/16  Yes Gareth Morgan, MD  aspirin EC 81 MG tablet Take 81 mg by mouth once.   Yes Historical Provider, MD  esomeprazole (NEXIUM) 40 MG capsule Take 1 capsule by mouth daily as needed (for heartburn).  03/22/15  Yes Historical Provider, MD  fexofenadine (ALLEGRA) 180 MG tablet Take 180 mg by mouth daily.   Yes Historical Provider, MD  flecainide (TAMBOCOR) 50 MG tablet Take 1 tablet (50 mg total) by mouth daily as needed. 07/10/16  Yes Erma Heritage, PA  fluticasone (FLONASE) 50 MCG/ACT nasal spray Place 1 spray into both nostrils daily. 02/04/14  Yes Historical Provider, MD  Fluticasone-Salmeterol (ADVAIR) 250-50 MCG/DOSE AEPB Inhale 1 puff into the lungs daily. 03/06/16  Yes Gareth Morgan, MD  meloxicam (MOBIC) 15 MG tablet Take 15 mg by mouth daily. 07/08/16  Yes Historical Provider, MD  metoprolol tartrate (LOPRESSOR) 25 MG tablet Take 1 tablet (25 mg total) by mouth 2 (two) times daily. 07/10/16  Yes Erma Heritage, PA    Review of Systems    As above, he has some degree of chronic palpitations that have been managed with beta blocker and when necessary flecainide. He has not had any significant increase in burden of palpitations. He recently had an admission for chest pain but has not had any chest pain since.   He denies chest pain, dyspnea, pnd,  orthopnea, n, v, dizziness, syncope, edema, weight gain, or early satiety. All other systems reviewed and are otherwise negative except as noted above.  Physical Exam    VS:  There were no vitals taken for this visit. , BMI There is no height or weight on file to calculate BMI. GEN: Well nourished, well developed, in no acute distress.  HEENT: normal w/ exception of deafness. Neck: Supple, no JVD, carotid bruits, or masses. Cardiac: RRR, no murmurs, rubs, or gallops. No clubbing, cyanosis, edema.  Radials/DP/PT 2+ and equal bilaterally.  Respiratory:  Respirations regular and unlabored, clear to auscultation bilaterally. GI: Soft, nontender, nondistended, BS + x 4. MS: no deformity or atrophy. Skin: warm and dry, no rash. Neuro:  Strength and sensation are intact. Psych: Normal affect.  Accessory Clinical Findings    Previous myoview and labs drawn during hospitalization reviewed in detail with pt.  Assessment & Plan  1.  Chest pain: Patient was admitted to [facility] in July with chest pain. He ruled out and Myoview was negative. He has not had any recurrent chest pain. No further evaluation is necessary at this time.  2. History of palpitations with documented PAT and PSVT: He continues to have intermittent palpitations, though the burden is stable. Continues taking metoprolol and uses flecainide maybe 2-3 times per month for more sustained palpitations.  3. Hyperlipidemia: Recent LDL was 104. He was previously intolerant to simvastatin.  4. Disposition: Follow-up with Dr. Ellyn Hack in 6 months or sooner if necessary.   Murray Hodgkins, NP 08/15/2016, 8:26 AM

## 2016-08-19 ENCOUNTER — Other Ambulatory Visit: Payer: Self-pay | Admitting: Gastroenterology

## 2016-08-20 ENCOUNTER — Other Ambulatory Visit: Payer: Self-pay

## 2016-08-20 MED ORDER — ESOMEPRAZOLE MAGNESIUM 40 MG PO CPDR: 40.0000 mg | DELAYED_RELEASE_CAPSULE | Freq: Every day | ORAL | 10 refills | Status: DC

## 2016-11-13 ENCOUNTER — Emergency Department (HOSPITAL_COMMUNITY): Payer: Medicare Other

## 2016-11-13 ENCOUNTER — Emergency Department (HOSPITAL_COMMUNITY)
Admission: EM | Admit: 2016-11-13 | Discharge: 2016-11-13 | Disposition: A | Discharge status: Home / Self Care | Payer: Medicare Other | Attending: Emergency Medicine | Admitting: Emergency Medicine

## 2016-11-13 ENCOUNTER — Encounter (HOSPITAL_COMMUNITY): Payer: Self-pay

## 2016-11-13 DIAGNOSIS — I471 Supraventricular tachycardia: Secondary | ICD-10-CM | POA: Diagnosis not present

## 2016-11-13 DIAGNOSIS — R079 Chest pain, unspecified: Secondary | ICD-10-CM | POA: Insufficient documentation

## 2016-11-13 DIAGNOSIS — Z7982 Long term (current) use of aspirin: Secondary | ICD-10-CM | POA: Diagnosis not present

## 2016-11-13 DIAGNOSIS — Z8673 Personal history of transient ischemic attack (TIA), and cerebral infarction without residual deficits: Secondary | ICD-10-CM | POA: Insufficient documentation

## 2016-11-13 DIAGNOSIS — J45909 Unspecified asthma, uncomplicated: Secondary | ICD-10-CM | POA: Diagnosis not present

## 2016-11-13 DIAGNOSIS — I10 Essential (primary) hypertension: Secondary | ICD-10-CM | POA: Diagnosis not present

## 2016-11-13 LAB — COMPREHENSIVE METABOLIC PANEL
ALBUMIN: 3.9 g/dL (ref 3.5–5.0)
ALT: 14 U/L — ABNORMAL LOW (ref 17–63)
AST: 22 U/L (ref 15–41)
Alkaline Phosphatase: 39 U/L (ref 38–126)
Anion gap: 10 (ref 5–15)
BUN: 14 mg/dL (ref 6–20)
CHLORIDE: 106 mmol/L (ref 101–111)
CO2: 23 mmol/L (ref 22–32)
Calcium: 9.2 mg/dL (ref 8.9–10.3)
Creatinine, Ser: 0.89 mg/dL (ref 0.61–1.24)
GFR calc Af Amer: >60 mL/min (ref >60)
GLUCOSE: 88 mg/dL (ref 65–99)
POTASSIUM: 3.9 mmol/L (ref 3.5–5.1)
SODIUM: 139 mmol/L (ref 135–145)
Total Bilirubin: 0.5 mg/dL (ref 0.3–1.2)
Total Protein: 6.6 g/dL (ref 6.5–8.1)

## 2016-11-13 LAB — I-STAT TROPONIN, ED
TROPONIN I, POC: 0 ng/mL (ref 0.00–0.08)
Troponin i, poc: 0 ng/mL (ref 0.00–0.08)

## 2016-11-13 LAB — CBC WITH DIFFERENTIAL/PLATELET
BASOS ABS: 0 10*3/uL (ref 0.0–0.1)
BASOS PCT: 1 %
EOS PCT: 2 %
Eosinophils Absolute: 0.1 10*3/uL (ref 0.0–0.7)
HCT: 40.3 % (ref 39.0–52.0)
Hemoglobin: 13.6 g/dL (ref 13.0–17.0)
Lymphocytes Relative: 35 %
Lymphs Abs: 2 10*3/uL (ref 0.7–4.0)
MCH: 30.5 pg (ref 26.0–34.0)
MCHC: 33.7 g/dL (ref 30.0–36.0)
MCV: 90.4 fL (ref 78.0–100.0)
MONO ABS: 0.4 10*3/uL (ref 0.1–1.0)
Monocytes Relative: 7 %
Neutro Abs: 3.1 10*3/uL (ref 1.7–7.7)
Neutrophils Relative %: 55 %
PLATELETS: 186 10*3/uL (ref 150–400)
RBC: 4.46 MIL/uL (ref 4.22–5.81)
RDW: 12.7 % (ref 11.5–15.5)
WBC: 5.6 10*3/uL (ref 4.0–10.5)

## 2016-11-13 MED ORDER — MORPHINE SULFATE (PF) 4 MG/ML IV SOLN: 4.0000 mg | Freq: Once | INTRAVENOUS | Status: DC

## 2016-11-13 MED ORDER — NITROGLYCERIN 0.4 MG SL SUBL
0.4000 mg | SUBLINGUAL_TABLET | SUBLINGUAL | Status: DC | PRN
  Administered 2016-11-13 (×2): 0.4 mg via SUBLINGUAL
  Filled 2016-11-13 (×2): qty 1

## 2016-11-13 NOTE — ED Provider Notes (Signed)
[place] DEPT Provider Note   CSN: [id number] Arrival date & time: 11/13/16  1255     History   Chief Complaint Chief Complaint  Patient presents with  . Chest Pain    HPI Nathaniel [name] is a 55 y.o. male.  HPI   55 year old male who is deaf (sign language interpreter used) presents with concern for chest pain. Patient reports a stabbing chest pain that has been coming and going since 8 AM this morning. Reports that it is worse with walking or exertion. Also describes the pain as a squeezing, "like an elephant sitting on my chest". Denies any associated shortness of breath, nausea, diaphoresis. Does report he had some lightheadedness and was told he looked pale. Reports that the pain was radiating to his right arm, and he had a sense of tingling in the right arm. Denies any other associated neurologic symptoms, denies headache, denies other numbness, weakness, change in vision or difficulty speaking. Denies history of DVT, PE, recent travel, recent surgery.     Past Medical History:  Diagnosis Date  . Allergy   . Arthritis    right knee  . Asthma    prn inhaler  . Barrett's esophagus 10-25-14 pt denies   states "minor"  . Chest pain    a. 06/2016 MV: EF 52%, no ischemia or infarct.  . 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 Deaf    Call [phone] for patient's sign language interpreter.   Marland Kitchen GERD (gastroesophageal reflux disease)   . Heartburn    occasional, related to certain foods  . High cholesterol   . Intermittent palpitations 10-25-14 patient denies   a. Event Monitor 04/2014: Mostly NSR - intermittent PACs, - 2 brief runs of PAT/PSVT  . PSVT (paroxysmal supraventricular tachycardia) ([place])    a. 04/2014 Event Monitor: brief PAT/PSVT-->uses prn flecainide.  . Seasonal allergies   . Stroke [facility]) 2013   TIA  . TIA (transient ischemic attack) 10-25-14 pt denies   no current deficits  . Vasovagal syncope     Patient Active Problem List   Diagnosis Date  Noted  . High cholesterol   . Pain in the chest   . Syncope 05/11/2015  . Chest pain of uncertain etiology 123XX123  . Sigmoid polyp 11/03/2014  . Barrett's esophagus 10/19/2014  . OSA (obstructive sleep apnea) 07/15/2014  . PSVT (paroxysmal supraventricular tachycardia) ([place]) 06/18/2014  . Insomnia 06/18/2014  . Palpitations 05/19/2014  . Normal coronary arteries- 2006 and June 2012 05/19/2014  . PVC (premature ventricular contraction) 05/19/2014  . Chest pain 05/19/2014  . Dyspnea 05/19/2014  . Unspecified visual disturbance 11/02/2013  . Metatarsalgia of right foot 08/20/2013  . Pain in right foot 07/23/2013  . Hyperlipidemia 12/22/2012  . Hearing impaired 12/21/2012  . TIA (transient ischemic attack) 12/20/2012  . CVA (cerebral infarction) 12/20/2012  . History of asthma 12/20/2012  . HEARTBURN 10/17/2009    Past Surgical History:  Procedure Laterality Date  . Abdominal Aortic Ultrasound  2012   normal abdominal aorta  . BUNIONECTOMY Left 2012   right done also  . CARDIAC CATHETERIZATION  06/24/2011   wwidely patent normal coronary arteries with normal ejection fraction  . Carotid Artery Dopplers  2012   tortuous but no stenoses.. No subclavian disease  . COLONOSCOPY  2015  . COLONOSCOPY WITH PROPOFOL N/A 11/03/2014   Procedure: COLONOSCOPY WITH PROPOFOL;  Surgeon: Milus Banister, MD;  [facility];  Service: Endoscopy;  Laterality: N/A;  . Foot Implant Removal  Right 07/15/2013   @ [place]  . INGUINAL HERNIA REPAIR    . KNEE ARTHROSCOPY Left 06/02/2012  . KNEE ARTHROSCOPY Right fall 2013   x 2  . KNEE ARTHROSCOPY Left 03/03/2013   Procedure: ARTHROSCOPY KNEE WITH CHONDROPLASTY PATELLA  AND LATERAL TIBIAL PLATEAU;  Surgeon: Kerin Salen, MD;  Location: [place];  Service: Orthopedics;  Laterality: Left;  . NM MYOVIEW LTD  2012   small fixed basal inferior artifact. Normal EF.  Marland Kitchen TRANSTHORACIC ECHOCARDIOGRAM  June2015   nnormal EF: 55-60%. No  wall motion abnormalities. Gr 1 DD.  Mild MR. Otherwise normal  . Upper and Lower Extremity Arterial Dopplers  2012   normal arterial flow bilateral upper extremities including subclavian is a carotid;;R. ABI 1.2, L. ABI 1.28. Normal flow velocities. Likely calcified vessels.  Marland Kitchen UPPER GASTROINTESTINAL ENDOSCOPY  2013       Home Medications    Prior to Admission medications   Medication Sig Start Date End Date Taking? Authorizing Provider  albuterol (PROVENTIL HFA;VENTOLIN HFA) 108 (90 BASE) MCG/ACT inhaler Inhale 1-2 puffs into the lungs every 6 (six) hours as needed for wheezing or shortness of breath. 04/16/15  Yes Pamella Pert, MD  albuterol (PROVENTIL) (2.5 MG/3ML) 0.083% nebulizer solution Take 3 mLs (2.5 mg total) by nebulization every 4 (four) hours as needed for wheezing or shortness of breath. 03/06/16  Yes Gareth Morgan, MD  aspirin 325 MG tablet Take 325 mg by mouth daily.   Yes Historical Provider, MD  esomeprazole (NEXIUM) 40 MG capsule Take 1 capsule (40 mg total) by mouth daily at 12 noon. 08/20/16  Yes Milus Banister, MD  fexofenadine (ALLEGRA) 180 MG tablet Take 180 mg by mouth daily.   Yes Historical Provider, MD  flecainide (TAMBOCOR) 50 MG tablet Take 1 tablet (50 mg total) by mouth daily as needed. Patient taking differently: Take 50 mg by mouth 2 (two) times daily.  07/10/16  Yes Erma Heritage, PA  fluticasone (FLONASE) 50 MCG/ACT nasal spray Place 1 spray into both nostrils daily. 02/04/14  Yes Historical Provider, MD  Fluticasone-Salmeterol (ADVAIR) 250-50 MCG/DOSE AEPB Inhale 1 puff into the lungs daily. 03/06/16  Yes Gareth Morgan, MD  meloxicam (MOBIC) 15 MG tablet Take 15 mg by mouth daily. 07/08/16  Yes Historical Provider, MD  metoprolol tartrate (LOPRESSOR) 25 MG tablet Take 1 tablet (25 mg total) by mouth 2 (two) times daily. 07/10/16  Yes Erma Heritage, PA  aspirin EC 81 MG tablet Take 81 mg by mouth once.    Historical Provider, MD    Family  History Family History  Problem Relation Age of Onset  . Ovarian cancer Mother   . Coronary artery disease Father   . Colon cancer Neg Hx   . Stomach cancer Neg Hx   . Esophageal cancer Neg Hx   . Rectal cancer Neg Hx     Social History Social History  Substance Use Topics  . Smoking status: Never Smoker  . Smokeless tobacco: Never Used  . Alcohol use 0.0 oz/week     Comment: occ     Allergies   Shellfish-derived products; Shrimp [shellfish allergy]; Simvastatin; Tamsulosin; Fish allergy; and Fish-derived products   Review of Systems Review of Systems  Constitutional: Positive for fatigue. Negative for fever.  HENT: Negative for sore throat.   Eyes: Negative for visual disturbance.  Respiratory: Negative for shortness of breath.   Cardiovascular: Positive for chest pain and palpitations. Negative for leg swelling.  Gastrointestinal:  Negative for abdominal pain, nausea and vomiting.  Genitourinary: Negative for difficulty urinating.  Musculoskeletal: Negative for back pain and neck stiffness.  Skin: Negative for rash.  Neurological: Positive for light-headedness and numbness (tingling pain right arm). Negative for syncope, facial asymmetry, speech difficulty, weakness and headaches.     Physical Exam Updated Vital Signs BP 115/81   Pulse 65   Temp 97.8 F (36.6 C) (Oral)   Resp 16   Ht 5\' 8"  (1.727 m)   Wt 165 lb (74.8 kg)   SpO2 97%   BMI 25.09 kg/m   Physical Exam  Constitutional: He is oriented to person, place, and time. He appears well-developed and well-nourished. No distress.  HENT:  Head: Normocephalic and atraumatic.  Eyes: Conjunctivae and EOM are normal.  Neck: Normal range of motion.  Cardiovascular: Normal rate, regular rhythm, normal heart sounds and intact distal pulses.  Exam reveals no gallop and no friction rub.   No murmur heard. Pulmonary/Chest: Effort normal and breath sounds normal. No respiratory distress. He has no wheezes. He has no  rales.  Abdominal: Soft. He exhibits no distension. There is no tenderness.  Musculoskeletal: He exhibits no edema.  Neurological: He is alert and oriented to person, place, and time. He has normal strength. No cranial nerve deficit or sensory deficit (denies). Coordination normal. GCS eye subscore is 4. GCS verbal subscore is 5. GCS motor subscore is 6.  CN II-XII tested and normal No pronator drift Normal UE and LE strength Fine intention tremor right hand, however normal finger-nose   Skin: Skin is warm and dry. He is not diaphoretic.  Nursing note and vitals reviewed.    ED Treatments / Results  Labs (all labs ordered are listed, but only abnormal results are displayed) Labs Reviewed  COMPREHENSIVE METABOLIC PANEL - Abnormal; Notable for the following:       Result Value   ALT 14 (*)    All other components within normal limits  CBC WITH DIFFERENTIAL/PLATELET  Randolm Idol, ED  Randolm Idol, ED    EKG  EKG Interpretation  Date/Time:  Wednesday November 13 2016 12:59:33 EST Ventricular Rate:  59 PR Interval:    QRS Duration: 96 QT Interval:  421 QTC Calculation: 417 R Axis:   18 Text Interpretation:  Sinus rhythm Abnormal R-wave progression, early transition Baseline wander in lead(s) V2 V3 No significant change was found Confirmed by [facility] MD, Donetta Isaza ([id number]) on 11/13/2016 1:03:56 PM       Radiology Dg Chest 2 View  Result Date: 11/13/2016 CLINICAL DATA:  Chest pain, onset today. EXAM: CHEST  2 VIEW COMPARISON:  Radiographs 07/09/2016 FINDINGS: The cardiomediastinal contours are normal. The lungs are clear. Pulmonary vasculature is normal. No consolidation, pleural effusion, or pneumothorax. No acute osseous abnormalities are seen. Unchanged anterior wedging of mid thoracic vertebra. Remote left rib fractures again seen. IMPRESSION: No acute abnormality. Electronically Signed   By: Jeb Levering M.D.   On: 11/13/2016 15:02    Procedures Procedures  (including critical care time)  Medications Ordered in ED Medications  nitroGLYCERIN (NITROSTAT) SL tablet 0.4 mg (0.4 mg Sublingual Given 11/13/16 1432)  morphine 4 MG/ML injection 4 mg (0 mg Intravenous Hold 11/13/16 1515)     Initial Impression / Assessment and Plan / ED Course  I have reviewed the triage vital signs and the nursing notes.  Pertinent labs & imaging results that were available during my care of the patient were reviewed by me and considered in my medical decision  making (see chart for details).  Clinical Course     55 year old male with a history of hyperlipidemia, hypertension, paroxysmal atrial tachycardia/proximalmost supraventricular tachycardia, TIA, deafness presents with concern for chest pain beginning this morning. Differential diagnosis for chest pain includes pulmonary embolus, dissection, pneumothorax, pneumonia, ACS, myocarditis, pericarditis.  EKG was done and evaluate by me and showed no acute ST changes and no signs of pericarditis. Chest x-ray was done and evaluated by me and radiology and showed no sign of pneumonia or pneumothorax.   Patient is low risk [place] with no dyspnea and have low suspicion for PE.  Patient has equal blood pressures bilaterally, equal pulses in all 4 extremities, no mediastinal widening, and have low suspicion for dissection.   He reports a tingling sensation in his right arm, however has a normal neurologic exam, including normal sensation, with primary concern of chest pain, and suspect that symptoms are more likely secondary to pain radiating from the chest then a primary neurologic concern, dissection or CVA. Has slight tremor in right hand which he has had in the remote past, and has normal finger-nose, normal strength.     Given patient describes some exertional component of his chest pain, initial improvement with nitroglycerin, will consult Cardiology for evaluation.  Cardiology, Dr. Debara Pickett, came to bedside for evaluation.  Patient has had catheterization in 2013 and 2006 with normal coronaries per his report, and has had similar symptoms per Dr. Debara Pickett of this with arrhythmia in the past.  He recommends increasing prn dose flecainide for palpitations and has low suspicion for pain related to CAD.    Given low suspicion for dissection, PE, CVA, ACS, feel if second troponin is normal he is stable for discharge home and Cardiology follow up. Patient is chest pain free and agrees with this plan.  Care assigned to oncoming provider with plan that if second troponin is negative he may follow up as outpatient.   Final Clinical Impressions(s) / ED Diagnoses   Final diagnoses:  Chest pain, unspecified type    New Prescriptions New Prescriptions   No medications on file     Gareth Morgan, MD 11/13/16 1719

## 2016-11-13 NOTE — ED Notes (Signed)
Sign language interpretor at bedside 

## 2016-11-13 NOTE — ED Notes (Signed)
Pt now states his pain is going into his right arm

## 2016-11-13 NOTE — ED Triage Notes (Signed)
Pt presents to the ed with ems, he started having chest pain at 0800, he was outside of his apartment holding his chest and a bystander called 911. He denies any nausea or shortness of breath, complains of dizziness when standing.  He received 324 of aspirin and 1 sl nitro that took his pain from a 7/10 to a 6/10. Patient is alert and oriented, and has a severe hearing deficit, translator has been called and requested per patient request.

## 2016-11-13 NOTE — ED Notes (Signed)
MD Schlossman aware of patient hypotension systolic in the [id number], given orders to hold morphine and 3rd nitro until BP systolic [id number].

## 2016-11-13 NOTE — ED Notes (Signed)
Pt stable, understands discharge instructions, and reasons for return.   
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
## 2016-11-13 NOTE — Consult Note (Signed)
CARDIOLOGY CONSULT NOTE   Patient ID: Nathaniel [name] MRN: [id number] DOB/AGE: 56/17/1962 56 y.o.  Admit date: 11/13/2016  Primary Physician   Helane Rima, MD Primary Cardiologist   Dr. Ellyn Hack Reason for Consultation   Chest Pain Requesting Physician  Dr. Billy Fischer  HPI: Nathaniel [name] is a 55 y.o. male with a history of PAT/PSVT (seen on event monitor in 2015), HLD, TIA, Asthma, GERD, and deafness who presents with palpitations and chest pain. Sign-language interpreter is present who assists with communication. Patient was in his usual state of health until this morning when he woke around 8 am and his wife noticed he looked pale and clammy. He had felt intermittent palpitations as if his heart were skipping beats, but not racing, which occur with activity but not at rest. He also felt retrosternal stabbing chest pain (8/10 at it's worst) which did not radiate to his neck, jaw, or back. He had associated numbness and tingling in his right arm "like ants were crawling." He felt his right arm is a little weaker than usual. He did not have change in vision, nausea, vomiting, dyspnea, or loss of consciousness. He had thought he would be able to continue his daily activities, however his palpitations and chest pain made him feel weak and he asked an acquaintance to call 911.  He was given Aspirin 324 and 1 sublingual NTG by EMS and NTG two more times in the ED with slight drop in blood pressure. A point of care troponin is negative and CBC/CMP are normal as well.  He was admitted in July 2017 with similar symptoms at which time he underwent lexiscan stress test which showed no reversible ischemia or infarct, normal LV wall motion, EF 52%, and deemed low-risk. He had prior cardiac catheterizations in 2012 and 2006 which demonstrated essentially normal coronary arteries without obstructive disease.  He works as a Animator and is able to do his usual work without issue. He walks 1 mile a  day and occasionally plays basketball or volleyball. He denies smoking, alcohol, or illicit drug use.   Past Medical History:  Diagnosis Date  . Allergy   . Arthritis    right knee  . Asthma    prn inhaler  . Barrett's esophagus 10-25-14 pt denies   states "minor"  . Chest pain    a. 06/2016 MV: EF 52%, no ischemia or infarct.  . 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 Deaf    Call [phone] for patient's sign language interpreter.   Marland Kitchen GERD (gastroesophageal reflux disease)   . Heartburn    occasional, related to certain foods  . High cholesterol   . Intermittent palpitations 10-25-14 patient denies   a. Event Monitor 04/2014: Mostly NSR - intermittent PACs, - 2 brief runs of PAT/PSVT  . PSVT (paroxysmal supraventricular tachycardia) ([place])    a. 04/2014 Event Monitor: brief PAT/PSVT-->uses prn flecainide.  . Seasonal allergies   . Stroke [facility]) 2013   TIA  . TIA (transient ischemic attack) 10-25-14 pt denies   no current deficits  . Vasovagal syncope      Past Surgical History:  Procedure Laterality Date  . Abdominal Aortic Ultrasound  2012   normal abdominal aorta  . BUNIONECTOMY Left 2012   right done also  . CARDIAC CATHETERIZATION  06/24/2011   wwidely patent normal coronary arteries with normal ejection fraction  . Carotid Artery Dopplers  2012   tortuous but no stenoses.. No subclavian disease  . COLONOSCOPY  2015  .  COLONOSCOPY WITH PROPOFOL N/A 11/03/2014   Procedure: COLONOSCOPY WITH PROPOFOL;  Surgeon: Milus Banister, MD;  Location: [facility];  Service: Endoscopy;  Laterality: N/A;  . Foot Implant Removal Right 07/15/2013   @ [place]  . INGUINAL HERNIA REPAIR    . KNEE ARTHROSCOPY Left 06/02/2012  . KNEE ARTHROSCOPY Right fall 2013   x 2  . KNEE ARTHROSCOPY Left 03/03/2013   Procedure: ARTHROSCOPY KNEE WITH CHONDROPLASTY PATELLA  AND LATERAL TIBIAL PLATEAU;  Surgeon: Kerin Salen, MD;  Location: [facility];  Service: Orthopedics;  Laterality: Left;   . NM MYOVIEW LTD  2012   small fixed basal inferior artifact. Normal EF.  Marland Kitchen TRANSTHORACIC ECHOCARDIOGRAM  June2015   nnormal EF: 55-60%. No wall motion abnormalities. Gr 1 DD.  Mild MR. Otherwise normal  . Upper and Lower Extremity Arterial Dopplers  2012   normal arterial flow bilateral upper extremities including subclavian is a carotid;;R. ABI 1.2, L. ABI 1.28. Normal flow velocities. Likely calcified vessels.  Marland Kitchen UPPER GASTROINTESTINAL ENDOSCOPY  2013    Allergies  Allergen Reactions  . Shellfish-Derived Products Itching and Rash  . Shrimp [Shellfish Allergy] Rash and Itching  . Simvastatin Other (See Comments)    DEPRESSION, VISUAL DISTURBANCE(FLASHING LIGHTS)  . Tamsulosin Other (See Comments)    Other reaction(s): Other (See Comments) Changes in behavior  Changes in behavior  Changes in behavior   . Fish Allergy Rash    SOFTSHELL FISH  . Fish-Derived Products Rash    SOFTSHELL FISH SOFTSHELL FISH    I have reviewed the patient's current medications .  morphine injection  4 mg Intravenous Once    nitroGLYCERIN  Prior to Admission medications   Medication Sig Start Date End Date Taking? Authorizing Provider  albuterol (PROVENTIL HFA;VENTOLIN HFA) 108 (90 BASE) MCG/ACT inhaler Inhale 1-2 puffs into the lungs every 6 (six) hours as needed for wheezing or shortness of breath. 04/16/15  Yes Pamella Pert, MD  albuterol (PROVENTIL) (2.5 MG/3ML) 0.083% nebulizer solution Take 3 mLs (2.5 mg total) by nebulization every 4 (four) hours as needed for wheezing or shortness of breath. 03/06/16  Yes Gareth Morgan, MD  aspirin 325 MG tablet Take 325 mg by mouth daily.   Yes Historical Provider, MD  esomeprazole (NEXIUM) 40 MG capsule Take 1 capsule (40 mg total) by mouth daily at 12 noon. 08/20/16  Yes Milus Banister, MD  fexofenadine (ALLEGRA) 180 MG tablet Take 180 mg by mouth daily.   Yes Historical Provider, MD  flecainide (TAMBOCOR) 50 MG tablet Take 1 tablet (50 mg total) by  mouth daily as needed. Patient taking differently: Take 50 mg by mouth 2 (two) times daily.  07/10/16  Yes Erma Heritage, PA  fluticasone (FLONASE) 50 MCG/ACT nasal spray Place 1 spray into both nostrils daily. 02/04/14  Yes Historical Provider, MD  Fluticasone-Salmeterol (ADVAIR) 250-50 MCG/DOSE AEPB Inhale 1 puff into the lungs daily. 03/06/16  Yes Gareth Morgan, MD  meloxicam (MOBIC) 15 MG tablet Take 15 mg by mouth daily. 07/08/16  Yes Historical Provider, MD  metoprolol tartrate (LOPRESSOR) 25 MG tablet Take 1 tablet (25 mg total) by mouth 2 (two) times daily. 07/10/16  Yes Erma Heritage, PA  aspirin EC 81 MG tablet Take 81 mg by mouth once.    Historical Provider, MD     Social History   Social History  . Marital status: Married    Spouse name: N/A  . Number of children: 1  . Years of  education: college   Occupational History  . disability    Social History Main Topics  . Smoking status: Never Smoker  . Smokeless tobacco: Never Used  . Alcohol use 0.0 oz/week     Comment: occ  . Drug use: No  . Sexual activity: Not on file   Other Topics Concern  . Not on file   Social History Narrative   He is deaf, requiring an interpreter.   He started a new job back in 2013, which was much less stressful for him.  He has subsequently gone on to disability.   He is a married father of one. He previously wrote his bike 2 times a week.   Does not smoke or drink.          Family Status  Relation Status  . Mother Deceased at age 22   "Old age"  . Father Deceased at age 64   heart attack  . Brother Alive  . Neg Hx    Family History  Problem Relation Age of Onset  . Ovarian cancer Mother   . Coronary artery disease Father   . Colon cancer Neg Hx   . Stomach cancer Neg Hx   . Esophageal cancer Neg Hx   . Rectal cancer Neg Hx        ROS:  Full 14 point review of systems complete and found to be negative unless listed above.  Physical Exam: Blood pressure 115/81,  pulse 65, temperature 97.8 F (36.6 C), temperature source Oral, resp. rate 16, height 5\' 8"  (1.727 m), weight 165 lb (74.8 kg), SpO2 97 %.  General: Well developed, well nourished, male in no acute distress Head: EOMI, No xanthomas. Normocephalic and atraumatic Lungs: Resp regular and unlabored, CTA. Heart: RRR no s3, s4, or murmurs..   Neck: No carotid bruits. No lymphadenopathy. No JVD. Abdomen: Bowel sounds present, abdomen soft and non-tender without masses or hernias noted. Msk: No weakness, no joint deformities or effusions. Extremities: No clubbing, cyanosis or edema. DP/PT/Radials 2+ and equal bilaterally. Neuro: Alert and oriented X 3. Hearing impaired. Psych:  Good affect, responds appropriately Skin: No rashes or lesions noted.  Labs:   Lab Results  Component Value Date   WBC 5.6 11/13/2016   HGB 13.6 11/13/2016   HCT 40.3 11/13/2016   MCV 90.4 11/13/2016   PLT 186 11/13/2016   No results for input(s): INR in the last 72 hours.   Recent Labs Lab 11/13/16 1501  NA 139  K 3.9  CL 106  CO2 23  BUN 14  CREATININE 0.89  CALCIUM 9.2  PROT 6.6  BILITOT 0.5  ALKPHOS 39  ALT 14*  AST 22  GLUCOSE 88  ALBUMIN 3.9   Magnesium  Date Value Ref Range Status  03/31/2016 2.1 1.7 - 2.4 mg/dL Final   No results for input(s): CKTOTAL, CKMB, TROPONINI in the last 72 hours.  Recent Labs  11/13/16 1507  TROPIPOC 0.00   No results found for: PROBNP Lab Results  Component Value Date   CHOL 166 07/10/2016   HDL 26 (L) 07/10/2016   LDLCALC 104 (H) 07/10/2016   TRIG 180 (H) 07/10/2016   Lab Results  Component Value Date   DDIMER 0.36 11/13/2015   No results found for: LIPASE, AMYLASE TSH  Date/Time Value Ref Range Status  06/24/2011 03:20 AM 1.993 0.350 - 4.500 uIU/mL Final   Vitamin B-12  Date/Time Value Ref Range Status  11/02/2013 01:32 PM 486 211 - 946 pg/mL Final  Echo:  TTE 06/14/2014: - Left ventricle: The cavity size was normal. Systolic  function was normal. The estimated ejection fraction was in the range of 55% to 60%. Wall motion was normal; there were no regional wall motion abnormalities. Doppler parameters are consistent with abnormal left ventricular relaxation (grade 1 diastolic dysfunction). There was no evidence of elevated ventricular filling pressure by Doppler parameters. - Aortic valve: Trileaflet; normal thickness leaflets. There was no regurgitation. - Aortic root: The aortic root was normal in size. - Mitral valve: Structurally normal valve. There was mild regurgitation. - Left atrium: The atrium was mildly dilated. - Right ventricle: Systolic function was normal. - Right atrium: The atrium was normal in size. - Tricuspid valve: There was no regurgitation. - Pulmonic valve: There was trivial regurgitation. - Pulmonary arteries: Systolic pressure was within the normal range. - Inferior vena cava: The vessel was normal in size. The respirophasic diameter changes were in the normal range (= 50%), consistent with normal central venous pressure.  Impressions:  - Normal biventricular size and function. No regional wall motion abnormalities. Impaired relaxation. Normal filling pressures. No significant valvular abnormality.  ECG:  Sinus rhythm, early R-wave transition  Radiology:  Dg Chest 2 View  Result Date: 11/13/2016 CLINICAL DATA:  Chest pain, onset today. EXAM: CHEST  2 VIEW COMPARISON:  Radiographs 07/09/2016 FINDINGS: The cardiomediastinal contours are normal. The lungs are clear. Pulmonary vasculature is normal. No consolidation, pleural effusion, or pneumothorax. No acute osseous abnormalities are seen. Unchanged anterior wedging of mid thoracic vertebra. Remote left rib fractures again seen. IMPRESSION: No acute abnormality. Electronically Signed   By: Jeb Levering M.D.   On: 11/13/2016 15:02    ASSESSMENT AND PLAN:     Arrhythmia: Patient's symptoms and  history consistent with arrhythmia. He has demonstrated PAT/PSVT on event monitor in the past. Two prior cardiac catheterizations and lexiscan stress test in July 2017 did not show any evidence CAD, obstructive disease, or reversible ischemia/infarct. Doubt his symptoms are from acute coronary syndrome. He takes Metoprolol 25 mg BID and Flecainide 50 mg as needed when symptoms occur. Flecainide was not helpful this morning, however he is taking a low dose for pharmacological conversion. We discussed three options with the patient including: 1. Daily flecainide for prevention 2. Higher dose "Pocket pill" flecainide as needed 3. EP consultation for evaluation of ablation  Since his QTc is normal and he has no CAD per prior evaluations, we have recommended option #2, and patient agrees to try "Pocket-Pill" flecainide dosing as needed when his symptoms occur. He should take Flecainide 200 mg (four of his 50 mg pills) when his symptoms occur. If symptoms persist after 30-60 minutes, he should return to the ED at which time we may need to pursue EP evaluation. The above was discussed with the patient with the assistance of a sign-language interpretor.  -Flecainide 200 mg as needed for symptomatic palpitations -Ok to discharge to home from a cardiac standpoint with advise to rest at home  Signed: Zada Finders, MD  [facility] PGY-2 11/13/2016, 5:18 PM

## 2016-11-17 ENCOUNTER — Emergency Department (HOSPITAL_BASED_OUTPATIENT_CLINIC_OR_DEPARTMENT_OTHER)
Admission: EM | Admit: 2016-11-17 | Discharge: 2016-11-17 | Disposition: A | Discharge status: Home / Self Care | Payer: Medicare Other | Attending: Emergency Medicine | Admitting: Emergency Medicine

## 2016-11-17 ENCOUNTER — Encounter (HOSPITAL_BASED_OUTPATIENT_CLINIC_OR_DEPARTMENT_OTHER): Payer: Self-pay | Admitting: *Deleted

## 2016-11-17 DIAGNOSIS — J45909 Unspecified asthma, uncomplicated: Secondary | ICD-10-CM | POA: Insufficient documentation

## 2016-11-17 DIAGNOSIS — R0789 Other chest pain: Secondary | ICD-10-CM | POA: Insufficient documentation

## 2016-11-17 DIAGNOSIS — R05 Cough: Secondary | ICD-10-CM | POA: Diagnosis not present

## 2016-11-17 DIAGNOSIS — R002 Palpitations: Secondary | ICD-10-CM | POA: Diagnosis not present

## 2016-11-17 DIAGNOSIS — R079 Chest pain, unspecified: Secondary | ICD-10-CM | POA: Diagnosis present

## 2016-11-17 DIAGNOSIS — R0602 Shortness of breath: Secondary | ICD-10-CM | POA: Insufficient documentation

## 2016-11-17 DIAGNOSIS — Z79899 Other long term (current) drug therapy: Secondary | ICD-10-CM | POA: Diagnosis not present

## 2016-11-17 LAB — CBC
HCT: 40.1 % (ref 39.0–52.0)
Hemoglobin: 13.5 g/dL (ref 13.0–17.0)
MCH: 31 pg (ref 26.0–34.0)
MCHC: 33.7 g/dL (ref 30.0–36.0)
MCV: 92 fL (ref 78.0–100.0)
PLATELETS: 185 10*3/uL (ref 150–400)
RBC: 4.36 MIL/uL (ref 4.22–5.81)
RDW: 12.6 % (ref 11.5–15.5)
WBC: 7.8 10*3/uL (ref 4.0–10.5)

## 2016-11-17 LAB — COMPREHENSIVE METABOLIC PANEL
ALBUMIN: 4 g/dL (ref 3.5–5.0)
ALK PHOS: 38 U/L (ref 38–126)
ALT: 13 U/L — ABNORMAL LOW (ref 17–63)
ANION GAP: 7 (ref 5–15)
AST: 20 U/L (ref 15–41)
BILIRUBIN TOTAL: 0.4 mg/dL (ref 0.3–1.2)
BUN: 16 mg/dL (ref 6–20)
CALCIUM: 8.9 mg/dL (ref 8.9–10.3)
CO2: 24 mmol/L (ref 22–32)
Chloride: 108 mmol/L (ref 101–111)
Creatinine, Ser: 1.32 mg/dL — ABNORMAL HIGH (ref 0.61–1.24)
GFR, EST NON AFRICAN AMERICAN: 59 mL/min — AB (ref >60)
GLUCOSE: 99 mg/dL (ref 65–99)
POTASSIUM: 3.7 mmol/L (ref 3.5–5.1)
Sodium: 139 mmol/L (ref 135–145)
TOTAL PROTEIN: 7.1 g/dL (ref 6.5–8.1)

## 2016-11-17 LAB — D-DIMER, QUANTITATIVE (NOT AT ARMC): D DIMER QUANT: 0.4 ug{FEU}/mL (ref 0.00–0.50)

## 2016-11-17 LAB — TROPONIN I

## 2016-11-17 MED ORDER — IPRATROPIUM-ALBUTEROL 0.5-2.5 (3) MG/3ML IN SOLN
3.0000 mL | Freq: Once | RESPIRATORY_TRACT | Status: DC
  Filled 2016-11-17: qty 3

## 2016-11-17 NOTE — ED Notes (Signed)
Patient is resting comfortably. 

## 2016-11-17 NOTE — ED Notes (Signed)
Pt given warm blanket.

## 2016-11-17 NOTE — ED Triage Notes (Signed)
Patient c/o chest since Wednesday that is intermittent. He was seen by a cardiologist. No chest pain at this time but some fatigue. Some SOB at times. Chest pain that he has is the same each time. He had a fall ion Wednesday and pain since that time.

## 2016-11-17 NOTE — ED Provider Notes (Signed)
[name] DEPT [facility] Provider Note   CSN: [id number] Arrival date & time: 11/17/16  1252  History   Chief Complaint Chief Complaint  Patient presents with  . Chest Pain    HPI Nathaniel [name] is a 55 y.o. male. Visit conducted with aid of Sign Language Interpretor.  HPI History of chest pain secondary to PSVT, currently prescribed Lopressor and Flecainide as needed. Recently went to ED on 11/15 with chest pain since 8am with radiation to right arm. Was evaluated by Cardiology who did not believe pain was cardiac. Flecainide increased.  Reports pain restarted on Wednesday and has been present since that time with occasional worsening. Pain located over central chest with radiation to right arm. Pain is always present, but worse with exertion.. Each episode lasts 4-6 hours. Took Aspirin 325mg  10minutes prior to arrival to ED. Feels that his heart is beating irregularly and skipping beats. Denies nausea or vomiting. Denies diaphoresis. Notes some shortness of breath. Mild cough since this morning. Reports he is going to Cardiology for a procedure on December 1 (unable to remember what procedure).  History of catheterization in 2013 and 2006 noted.  Past Medical History:  Diagnosis Date  . Allergy   . Arthritis    right knee  . Asthma    prn inhaler  . Barrett's esophagus 10-25-14 pt denies   states "minor"  . Chest pain    a. 06/2016 MV: EF 52%, no ischemia or infarct.  . 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 Deaf    Call [phone] for patient's sign language interpreter.   Marland Kitchen GERD (gastroesophageal reflux disease)   . Heartburn    occasional, related to certain foods  . High cholesterol   . Intermittent palpitations 10-25-14 patient denies   a. Event Monitor 04/2014: Mostly NSR - intermittent PACs, - 2 brief runs of PAT/PSVT  . PSVT (paroxysmal supraventricular tachycardia) ([place])    a. 04/2014 Event Monitor: brief PAT/PSVT-->uses prn flecainide.  . Seasonal allergies     . Stroke [facility]) 2013   TIA  . TIA (transient ischemic attack) 10-25-14 pt denies   no current deficits  . Vasovagal syncope     Patient Active Problem List   Diagnosis Date Noted  . High cholesterol   . Pain in the chest   . Syncope 05/11/2015  . Chest pain of uncertain etiology [id number]  . Sigmoid polyp 11/03/2014  . Barrett's esophagus 10/19/2014  . OSA (obstructive sleep apnea) 07/15/2014  . SVT (supraventricular tachycardia) ([place]) 06/18/2014  . Insomnia 06/18/2014  . Palpitations 05/19/2014  . Normal coronary arteries- 2006 and June 2012 05/19/2014  . PVC (premature ventricular contraction) 05/19/2014  . Chest pain 05/19/2014  . Dyspnea 05/19/2014  . Unspecified visual disturbance 11/02/2013  . Metatarsalgia of right foot 08/20/2013  . Pain in right foot 07/23/2013  . Hyperlipidemia 12/22/2012  . Hearing impaired 12/21/2012  . TIA (transient ischemic attack) 12/20/2012  . CVA (cerebral infarction) 12/20/2012  . History of asthma 12/20/2012  . HEARTBURN 10/17/2009    Past Surgical History:  Procedure Laterality Date  . Abdominal Aortic Ultrasound  2012   normal abdominal aorta  . BUNIONECTOMY Left 2012   right done also  . CARDIAC CATHETERIZATION  06/24/2011   wwidely patent normal coronary arteries with normal ejection fraction  . Carotid Artery Dopplers  2012   tortuous but no stenoses.. No subclavian disease  . COLONOSCOPY  2015  . COLONOSCOPY WITH PROPOFOL N/A 11/03/2014   Procedure: COLONOSCOPY WITH PROPOFOL;  Surgeon: Milus Banister, MD;  Location: [facility];  Service: Endoscopy;  Laterality: N/A;  . Foot Implant Removal Right 07/15/2013   @ [place]  . INGUINAL HERNIA REPAIR    . KNEE ARTHROSCOPY Left 06/02/2012  . KNEE ARTHROSCOPY Right fall 2013   x 2  . KNEE ARTHROSCOPY Left 03/03/2013   Procedure: ARTHROSCOPY KNEE WITH CHONDROPLASTY PATELLA  AND LATERAL TIBIAL PLATEAU;  Surgeon: Kerin Salen, MD;  Location: [facility];  Service:  Orthopedics;  Laterality: Left;  . NM MYOVIEW LTD  2012   small fixed basal inferior artifact. Normal EF.  Marland Kitchen TRANSTHORACIC ECHOCARDIOGRAM  June2015   nnormal EF: 55-60%. No wall motion abnormalities. Gr 1 DD.  Mild MR. Otherwise normal  . Upper and Lower Extremity Arterial Dopplers  2012   normal arterial flow bilateral upper extremities including subclavian is a carotid;;R. ABI 1.2, L. ABI 1.28. Normal flow velocities. Likely calcified vessels.  Marland Kitchen UPPER GASTROINTESTINAL ENDOSCOPY  2013       Home Medications    Prior to Admission medications   Medication Sig Start Date End Date Taking? Authorizing Provider  albuterol (PROVENTIL HFA;VENTOLIN HFA) 108 (90 BASE) MCG/ACT inhaler Inhale 1-2 puffs into the lungs every 6 (six) hours as needed for wheezing or shortness of breath. 04/16/15   Pamella Pert, MD  albuterol (PROVENTIL) (2.5 MG/3ML) 0.083% nebulizer solution Take 3 mLs (2.5 mg total) by nebulization every 4 (four) hours as needed for wheezing or shortness of breath. 03/06/16   Gareth Morgan, MD  aspirin 325 MG tablet Take 325 mg by mouth daily.    Historical Provider, MD  aspirin EC 81 MG tablet Take 81 mg by mouth once.    Historical Provider, MD  esomeprazole (NEXIUM) 40 MG capsule Take 1 capsule (40 mg total) by mouth daily at 12 noon. 08/20/16   Milus Banister, MD  fexofenadine (ALLEGRA) 180 MG tablet Take 180 mg by mouth daily.    Historical Provider, MD  flecainide (TAMBOCOR) 50 MG tablet Take 1 tablet (50 mg total) by mouth daily as needed. Patient taking differently: Take 50 mg by mouth 2 (two) times daily.  07/10/16   Erma Heritage, PA  fluticasone (FLONASE) 50 MCG/ACT nasal spray Place 1 spray into both nostrils daily. 02/04/14   Historical Provider, MD  Fluticasone-Salmeterol (ADVAIR) 250-50 MCG/DOSE AEPB Inhale 1 puff into the lungs daily. 03/06/16   Gareth Morgan, MD  meloxicam (MOBIC) 15 MG tablet Take 15 mg by mouth daily. 07/08/16   Historical Provider, MD    metoprolol tartrate (LOPRESSOR) 25 MG tablet Take 1 tablet (25 mg total) by mouth 2 (two) times daily. 07/10/16   Erma Heritage, PA    Family History Family History  Problem Relation Age of Onset  . Ovarian cancer Mother   . Coronary artery disease Father   . Colon cancer Neg Hx   . Stomach cancer Neg Hx   . Esophageal cancer Neg Hx   . Rectal cancer Neg Hx     Social History Social History  Substance Use Topics  . Smoking status: Never Smoker  . Smokeless tobacco: Never Used  . Alcohol use 0.0 oz/week     Comment: occ     Allergies   Shellfish-derived products; Shrimp [shellfish allergy]; Simvastatin; Tamsulosin; Fish allergy; and Fish-derived products   Review of Systems Review of Systems  Constitutional: Negative for diaphoresis and fever.  HENT: Negative for congestion.   Respiratory: Positive for cough and shortness of breath.  Negative for wheezing.   Cardiovascular: Positive for chest pain and palpitations.  Gastrointestinal: Negative for abdominal pain, nausea and vomiting.     Physical Exam Updated Vital Signs BP 113/73 (BP Location: Left Arm)   Pulse 76   Temp 98 F (36.7 C) (Oral)   Resp 20   SpO2 97%   Physical Exam  Constitutional: He appears well-developed and well-nourished. No distress.  HENT:  Head: Normocephalic and atraumatic.  Cardiovascular: Normal rate and regular rhythm.   No murmur heard. Pulmonary/Chest: Effort normal. No respiratory distress. He has no wheezes.  Abdominal: Soft. He exhibits no distension. There is no tenderness.  Musculoskeletal: He exhibits no edema.  Psychiatric: He has a normal mood and affect. His behavior is normal.     ED Treatments / Results  Labs (all labs ordered are listed, but only abnormal results are displayed) Labs Reviewed  COMPREHENSIVE METABOLIC PANEL - Abnormal; Notable for the following:       Result Value   Creatinine, Ser 1.32 (*)    ALT 13 (*)    GFR calc non Af Amer 59 (*)     All other components within normal limits  TROPONIN I  D-DIMER, QUANTITATIVE (NOT AT [facility])  CBC    EKG  EKG Interpretation  Date/Time:  Sunday November 17 2016 13:02:41 EST Ventricular Rate:  57 PR Interval:    QRS Duration: 89 QT Interval:  411 QTC Calculation: 401 R Axis:   9 Text Interpretation:  Sinus rhythm Abnormal R-wave progression, early transition LVH by voltage No significant change since last tracing Confirmed by Maryan Rued  MD, Loree Fee ([id number]) on 11/17/2016 1:19:27 PM       Radiology No results found.  Procedures Procedures (including critical care time)  Medications Ordered in ED Medications  ipratropium-albuterol (DUONEB) 0.5-2.5 (3) MG/3ML nebulizer solution 3 mL (3 mLs Nebulization Not Given 11/17/16 1437)     Initial Impression / Assessment and Plan / ED Course  I have reviewed the triage vital signs and the nursing notes.  Pertinent labs & imaging results that were available during my care of the patient were reviewed by me and considered in my medical decision making (see chart for details).  Clinical Course   - CMP with elevated Creatinine to 1.32 - Troponin negative - D Dimer negative - CBC normal - Offered Duoneb, but patient refused  Final Clinical Impressions(s) / ED Diagnoses   Final diagnoses:  Atypical chest pain  Workup normal and evaluated by Cardiology for same pain on 11/13/16. Vitals stable. Stable for discharge. Recommend contacting his Cardiologist in the morning to see if they have any further recommendations. Follow up with his PCP to recheck kidney function.  New Prescriptions New Prescriptions   No medications on file     Lorna Few, DO 11/17/16 [place], MD 11/24/16 2032

## 2016-11-17 NOTE — Discharge Instructions (Signed)
Please contact your Cardiologist tomorrow to see if they have further recommendations. Follow up with your primary doctor for repeat blood work in a few days.

## 2016-11-22 ENCOUNTER — Other Ambulatory Visit: Payer: Self-pay | Admitting: Cardiology

## 2016-11-25 NOTE — Telephone Encounter (Signed)
REFILL 

## 2016-11-29 ENCOUNTER — Encounter: Payer: Self-pay | Admitting: Cardiology

## 2016-11-29 ENCOUNTER — Ambulatory Visit (INDEPENDENT_AMBULATORY_CARE_PROVIDER_SITE_OTHER): Payer: Medicare Other | Admitting: Cardiology

## 2016-11-29 VITALS — BP 116/73 | HR 57 | Ht 71.0 in | Wt 172.4 lb

## 2016-11-29 DIAGNOSIS — I493 Ventricular premature depolarization: Secondary | ICD-10-CM | POA: Diagnosis not present

## 2016-11-29 DIAGNOSIS — R55 Syncope and collapse: Secondary | ICD-10-CM

## 2016-11-29 DIAGNOSIS — R0789 Other chest pain: Secondary | ICD-10-CM

## 2016-11-29 DIAGNOSIS — R079 Chest pain, unspecified: Secondary | ICD-10-CM

## 2016-11-29 DIAGNOSIS — I471 Supraventricular tachycardia: Secondary | ICD-10-CM | POA: Diagnosis not present

## 2016-11-29 NOTE — Progress Notes (Signed)
PCP: Helane Rima, MD  Clinic Note: Chief Complaint  Patient presents with  . Hospitalization Follow-up    episodes of CP, ? SVT   Sign language interpreter present HPI: Nathaniel [name] is a 55 y.o. male with a PMH below who presents today for follow-up from 2 recent ER visits for palpitations. Mr. Gegg has had extensive cardiac workup in the past. He has had nuclear stress tests, echocardiograms, vascular ultrasounds every single cardiovascular system. He even had cardiac catheterizations. The only thing that is shown up that was revealing of anything was too short runs of PAT/PSVT by event monitor back in May 2015. He has had multiple evaluations for atypical chest pain but is also had some intermittent "passout spells. When I saw him back in August 2016 I started him on flecainide which I think he's been taking more when necessary basis and has not ever used it until recently.  [name] was last seen in clinic back in January of this year, and had no major complaints. Apparently he took when necessary flecainide couple times a month leading up to that for brief episodes and had rapid relief. Usually his episodes are associated with missing her metoprolol dose.  Recent Hospitalizations/ER visits since last clinic visit  July 11-12, 2017: Admitted for atypical chest pain --> chest pain and pressure may feel lightheaded and dizzy. Pain subsided after aspirin. He then had a second episode of sharp and stabbing pain that made his legs feel weak. Pain reduced from 9/10-3/10 with nitroglycerin. Finally resolved with morphine. He ruled out for MI. Telemetry did not show any evidence of PSVT or PAT. Because of bradycardia is beta blocker dose was reduced down to 25 mg twice a day.  He was discharged after Myoview was read as negative for ischemia or infarction.  11/13/2016 ER: Again admitted for chest pain sharp/stabbing. Then described squeezing "like an elephant on his chest".   He began to feel pale and dizzy felt his heart was beating fast and he walked up to go to his car. He decided not to drive so when he walked from the other sinuses are "fell out" EMS was then called and is brought to the hospital.  He was evaluated by Dr. Debara Pickett who felt that the symptoms are probably more related to episodes of SVT. He then recommended increasing the when necessary dose of flecainide to 200 mg. -- Don't think the patient understood this.  11/17/2016 ER: Again noted chest pain radiating to the right arm with intermittent dyspnea.  Studies Reviewed:  Lexiscan Myoview 07/10/2016: LOW RISK. EF 50-55%. No evidence of ischemia or infarction.  Cardiac Event Monitor: Ranging from mostly sinus rhythm to occasional sinus bradycardia and sinus tachycardia. Heart rate from 53 bpm to 129 bpm. No documented PVCs or PACs noted. No arrhythmia noted. No PSVT or A. Fib.  "Heart racing" noted with sinus tachycardia.  Interval History: [name] presents today very exasperated because of his 2 recent episodes. He had not had an episode of concerning symptoms in quite some time. He says that ever since his episode on November 15 which he fixates on, he has just not been feeling right, he is just very concerned. He is concerned because his wife and son keep pushing to do things, and he feels like he should be able to. He is very concerned about the sharp pain that he had.  He brings to me his list of papers with his family history of cardiac disease. He  is very concerned that these had a heart attack.  When he does not acknowledges that he was explained in the hospital what to do in these situations and that this was an event probably related to SVT. He hasn't had anymore chest discomfort since November 19, but has just been very anxious about this whole thing. No sick be/. Near-syncope since then. No exertional chest pain dyspnea. No PND, orthopnea or edema.  ROS: A comprehensive was performed. Review of  Systems  Constitutional: Negative for malaise/fatigue (He's just been feeling like he doesn't want anything since the episode in November).  HENT: Positive for hearing loss (He is deaf). Negative for congestion.   Respiratory: Negative for cough, shortness of breath and wheezing.   Cardiovascular:       Per history of present illness  Gastrointestinal: Negative for blood in stool, constipation and melena.  Genitourinary: Negative for hematuria.  Musculoskeletal: Negative for falls and myalgias.  Neurological: Positive for dizziness (Only during his episode) and loss of consciousness (But none since November 15). Negative for focal weakness.  Endo/Heme/Allergies: Negative for environmental allergies.  Psychiatric/Behavioral: Negative for depression and memory loss. The patient is nervous/anxious. The patient does not have insomnia.   All other systems reviewed and are negative.   Past Medical History:  Diagnosis Date  . Allergy   . Arthritis    right knee  . Asthma    prn inhaler  . Barrett's esophagus 10-25-14 pt denies   states "minor"  . Chest pain    a. 06/2016 MV: EF 52%, no ischemia or infarct.  . 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 Deaf    Call [phone] for patient's sign language interpreter.   Marland Kitchen GERD (gastroesophageal reflux disease)   . Heartburn    occasional, related to certain foods  . High cholesterol   . Intermittent palpitations 10-25-14 patient denies   a. Event Monitor 04/2014: Mostly NSR - intermittent PACs, - 2 brief runs of PAT/PSVT  . PSVT (paroxysmal supraventricular tachycardia) ([place])    a. 04/2014 Event Monitor: brief PAT/PSVT-->uses prn flecainide.  . Seasonal allergies   . Stroke [facility]) 2013   TIA  . TIA (transient ischemic attack) 10-25-14 pt denies   no current deficits  . Vasovagal syncope     Past Surgical History:  Procedure Laterality Date  . Abdominal Aortic Ultrasound  2012   normal abdominal aorta  . BUNIONECTOMY Left 2012   right  done also  . CARDIAC CATHETERIZATION  06/24/2011   wwidely patent normal coronary arteries with normal ejection fraction  . Carotid Artery Dopplers  2012   tortuous but no stenoses.. No subclavian disease  . COLONOSCOPY  2015  . COLONOSCOPY WITH PROPOFOL N/A 11/03/2014   Procedure: COLONOSCOPY WITH PROPOFOL;  Surgeon: Milus Banister, MD;  Location: [facility];  Service: Endoscopy;  Laterality: N/A;  . Foot Implant Removal Right 07/15/2013   @ [facility]  . INGUINAL HERNIA REPAIR    . KNEE ARTHROSCOPY Left 06/02/2012  . KNEE ARTHROSCOPY Right fall 2013   x 2  . KNEE ARTHROSCOPY Left 03/03/2013   Procedure: ARTHROSCOPY KNEE WITH CHONDROPLASTY PATELLA  AND LATERAL TIBIAL PLATEAU;  Surgeon: Kerin Salen, MD;  Location: [facility];  Service: Orthopedics;  Laterality: Left;  . NM MYOVIEW LTD  2012   small fixed basal inferior artifact. Normal EF.  Marland Kitchen TRANSTHORACIC ECHOCARDIOGRAM  June2015   nnormal EF: 55-60%. No wall motion abnormalities. Gr 1 DD.  Mild MR. Otherwise normal  .  Upper and Lower Extremity Arterial Dopplers  2012   normal arterial flow bilateral upper extremities including subclavian is a carotid;;R. ABI 1.2, L. ABI 1.28. Normal flow velocities. Likely calcified vessels.  Marland Kitchen UPPER GASTROINTESTINAL ENDOSCOPY  2013    Prior to Admission medications   Medication Sig Start Date End Date Taking? Authorizing Provider  albuterol (PROVENTIL HFA;VENTOLIN HFA) 108 (90 BASE) MCG/ACT inhaler Inhale 1-2 puffs into the lungs every 6 (six) hours as needed for wheezing or shortness of breath. 04/16/15  Yes Pamella Pert, MD  albuterol (PROVENTIL) (2.5 MG/3ML) 0.083% nebulizer solution Take 3 mLs (2.5 mg total) by nebulization every 4 (four) hours as needed for wheezing or shortness of breath. 03/06/16  Yes Gareth Morgan, MD  aspirin EC 81 MG tablet Take 81 mg by mouth once.   Yes Historical Provider, MD  esomeprazole (NEXIUM) 40 MG capsule Take 1 capsule (40 mg total) by mouth daily at 12  noon. 08/20/16  Yes Milus Banister, MD  fexofenadine (ALLEGRA) 180 MG tablet Take 180 mg by mouth daily.   Yes Historical Provider, MD  flecainide (TAMBOCOR) 50 MG tablet Take 1 tablet (50 mg total) by mouth 2 (two) times daily. KEEP OV. 11/25/16  Yes Leonie Man, MD  fluticasone [facility]) 50 MCG/ACT nasal spray Place 1 spray into both nostrils daily. 02/04/14  Yes Historical Provider, MD  Fluticasone-Salmeterol (ADVAIR) 250-50 MCG/DOSE AEPB Inhale 1 puff into the lungs daily. 03/06/16  Yes Gareth Morgan, MD  metoprolol tartrate (LOPRESSOR) 25 MG tablet Take 1 tablet (25 mg total) by mouth 2 (two) times daily. 07/10/16  Yes Erma Heritage, PA   Allergies  Allergen Reactions  . Prednisone Palpitations  . Shellfish-Derived Products Itching and Rash  . Shrimp [Shellfish Allergy] Rash and Itching  . Simvastatin Other (See Comments)    DEPRESSION, VISUAL DISTURBANCE(FLASHING LIGHTS)  . Tamsulosin Other (See Comments)    Other reaction(s): Other (See Comments) Changes in behavior  Changes in behavior  Changes in behavior   . Fish Allergy Rash    SOFTSHELL FISH  . Fish-Derived Products Rash    SOFTSHELL FISH SOFTSHELL FISH     Social History   Social History  . Marital status: Married    Spouse name: N/A  . Number of children: 1  . Years of education: college   Occupational History  . disability    Social History Main Topics  . Smoking status: Never Smoker  . Smokeless tobacco: Never Used  . Alcohol use 0.0 oz/week     Comment: occ  . Drug use: No  . Sexual activity: Not Asked   Other Topics Concern  . None   Social History Narrative   He is deaf, requiring an interpreter.   He started a new job back in 2013, which was much less stressful for him.  He has subsequently gone on to disability.   He is a married father of one. He previously wrote his bike 2 times a week.   Does not smoke or drink.         Family History  Problem Relation Age of Onset  . Ovarian  cancer Mother   . Coronary artery disease Father   . Colon cancer Neg Hx   . Stomach cancer Neg Hx   . Esophageal cancer Neg Hx   . Rectal cancer Neg Hx      Wt Readings from Last 3 Encounters:  11/29/16 78.2 kg (172 lb 6.4 oz)  11/13/16 74.8 kg (165 lb)  08/15/16 78.4 kg (172 lb 12.8 oz)    PHYSICAL EXAM BP 116/73 (BP Location: Right Arm)   Pulse (!) 57   Ht 5\' 11"  (1.803 m)   Wt 78.2 kg (172 lb 6.4 oz)   BMI 24.04 kg/m  General: Pleasant, NAD. Speaks using sign language ; healthy appearing, but mildly disheveled. Psych: Normal affect. Easily excited Neuro: Alert and oriented X 3. Moves all extremities spontaneously. Grossly normal. HEENT: [place]/AT, EOMI, MMM Neck: Supple without bruits or JVD.  Lungs: CPAP, nonlabored, good air movement. No W./R./R. Except for mild extremity wheezing in the bases. Heart: RRR no s3, s4, or murmurs.  Abdomen: Soft, non-tender, non-distended, BS + x 4.  Extremities: No C/C/E. DP/PT/Radials 2+ and equal bilaterally.Cranial nerves: normal (II-XII grossly intact    Adult ECG Report n/a  Other studies Reviewed: Additional studies/ records that were reviewed today include:  Recent Labs:  Reviewed from ER     ASSESSMENT / PLAN: Problem List Items Addressed This Visit    SVT (supraventricular tachycardia) ([place]) - Primary (Chronic)    We have titrated up his beta blocker dose, however this was reduced because of nighttime bradycardia. Becomes very confusing when medications are changed. For now will continue 25 mg twice a day metoprolol with the ability to use an additional dose when necessary.  Provided patient information on SVT (draw out the pathways) -- spent 1 hr with the patient.  We will increase the when necessary dose of flecainide as described below: Instruction for taking FLECAINIDE. IF YOU HAVE A SPELL LASTING LESS THAN 5 MIN  TAKE FLECAINIDE 50 MG TABLET  TWICE A DAY  FOR 3 DAYS.  IF YOUR SPELL LAST MORE THAN 5 MIN. TRY   VAGAL MANUVERS--  STRAINING AS IF YOU ARE HAVING A BOWEL MOVEMENT DRINKING ICE COLD  WATER TAKE A DEEP COUGH THEN TRY TAKING 2 ( 50 MG) FLECAINIDE TABLETS THEN IF LAST LONER THAN ANOTHER 10 MINS -- TAKE AN ADDITIONAL 2 (50 MG ) TABLETS AND WAIT ,IF CONTINUE FOR AN HOUR GO TO  EMERGENCY ROOM.  REGARDLESS TAKE FLECAINIDE 50 MG  TWICE A DAY FOR 3 DAYS AFTER EACH EPISODES       Relevant Orders   EKG 12-Lead   Chest pain of uncertain etiology (Chronic)    I really think the pain is just is probably as much noncardiac and musculoskeletal as it is cardiac. There are sometimes when it may be related to an SVT. At this point were treated as such. He has had a negative Myoview is a cardiac catheterizations. I don't think there is any coronary ischemia. There could be some spasm component since there was some relief with nitroglycerin. He has not had these episodes very frequently, and is not likely to remember to take nitroglycerin with him. -> This would be an option in the future      PVC (premature ventricular contraction) (Chronic)   Relevant Orders   EKG 12-Lead   Syncope    It would appear that he did have an episode where he may have passed out in November. I explained to him that if he is feeling miserable rapid heart rates and sharp chest discomfort, he should lie down or sit down on [place] walking around. If he is potentially driving a car he needs a bloatedness on the road. These episodes of always passed spontaneously.  Keep point is to remain hydrated, and to remember to use when necessary flecainide as described for SVT  Partially due to his requiring sign language for interpretation, and his perseverations on symptoms and inability to conceptualize what I was trying to explain for 2 and 3 times, I spent well over an hour with the patient. Greater than 50% of the time was spent in direct consultation and trying to explain things to determine a plan.   Current medicines are  reviewed at length with the patient today. (+/- concerns) not sure how to take his meds. The following changes have been made:   Patient Instructions   Instruction for taking FLECAINIDE. IF YOU HAVE A SPELL LASTING LESS THAN 5 MIN  TAKE FLECAINIDE 50 MG TABLET  TWICE A DAY  FOR 3 DAYS.  IF YOUR SPELL LAST MORE THAN 5 MIN. TRY  VAGAL MANUVERS--  STRAINING AS IF YOU ARE HAVING A BOWEL MOVEMENT DRINKING ICE COLD  WATER TAKE A DEEP COUGH THEN TRY TAKING 2 ( 50 MG) FLECAINIDE TABLETS THEN IF LAST LONER THAN ANOTHER 10 MINS -- TAKE AN ADDITIONAL 2 (50 MG ) TABLETS AND WAIT ,IF CONTINUE FOR AN HOUR GO TO  EMERGENCY ROOM.  REGARDLESS TAKE FLECAINIDE 50 MG  TWICE A DAY FOR 3 DAYS AFTER EACH EPISODES     Your physician wants you to follow-up in: [place].-30 MIN  You will receive a reminder letter in the mail two months in advance. If you don't receive a letter, please call our office to schedule the follow-up appointment.  If you need a refill on your cardiac medications before your next appointment, please call your pharmacy.       Supraventricular Tachycardia, Adult Supraventricular tachycardia (SVT) is a kind of abnormal heartbeat. It makes your heart beat very fast and then beat at a normal speed. A normal heart beats 60-100 times a minute. This condition can make your heart beat more than 150 times a minute. Times of having a fast heartbeat (episodes) can be scary, but they are usually not dangerous. They can lead to problems if:  They happen often.  They last a long time. Symptoms of this condition include:  A pounding heart.  A feeling that your heart is skipping beats (palpitations).  Weakness.  Trouble getting enough air (shortness of breath).  Pain or tightness in your chest.  Feeling like you are going to pass out (light-headedness).  Feeling worried or nervous (anxiety).  Dizziness.  Sweating.  Feeling sick to your stomach  (nausea).  Passing out (fainting).  Tiredness. Sometimes, there are no symptoms. Follow these instructions at home: Stress  Avoid things that make you feel stressed.  Find out what helps you feel less stressed. Try:  Doing a relaxing activity, like yoga, meditation, or being out in nature.  Listening to relaxing music.  Doing relaxation techniques, like deep breathing.  Taking steps to be healthy. These include getting lots of sleep, exercising, and eating a balanced diet.  Talking with a mental health doctor. Sleep  Try to get at least 7 hours of sleep each night. Tobacco and nicotine  Do not use anything that has nicotine or tobacco, such as cigarettes and e-cigarettes. If you need help quitting, ask your doctor. Alcohol  If alcohol gives you a fast heartbeat, do not drink alcohol.  If alcohol does not seem to give you a fast heartbeat, limit your alcohol. For nonpregnant women, this means no more than 1 drink a day. For men, this means no more than 2 drinks a day. "One drink" means one of these:  12 oz of beer.  5 oz of wine.  1 oz of hard liquor. Caffeine  If caffeine gives you a fast heartbeat, do not eat, drink, or use anything with caffeine in it.  If caffeine does not seem to give you a fast heartbeat, limit how much caffeine you eat, drink, or use. Stimulant drugs  Do not use stimulant drugs. These are drugs like cocaine or methamphetamine. If you need help quitting, ask your doctor. General instructions  Stay at a healthy weight.  Exercise regularly. Ask your doctor to suggest some good activities for you. Try one of these options:  150 minutes a week of gentle exercise, like walking or yoga.  75 minutes a week of exercise that is very active, like running or swimming.  A combination of gentle exercise and very active exercise.  Do home treatments to slow down your heartbeat as told by your doctor.  Take over-the-counter and prescription  medicines only as told by your doctor. Contact a doctor if:  You have a fast heartbeat more often.  Times of having a fast heartbeat last longer than before.  Your home treatments to slow down your heartbeat do not help.  You have new symptoms. Get help right away if:  You have chest pain.  Your symptoms get worse.  You have trouble breathing.  Your heart beats very fast for more than 20 minutes.  You pass out (faint). These symptoms may be an emergency. Do not wait to see if the symptoms will go away. Get medical help right away. Call your local emergency services (911 in the [place] Do not drive yourself to the hospital.  This information is not intended to replace advice given to you by your health care provider. Make sure you discuss any questions you have with your health care provider. Document Released: 12/16/2005 Document Revised: 08/22/2016 Document Reviewed: 08/22/2016 Elsevier Interactive Patient Education  2017 [employer].    Studies Ordered:   Orders Placed This Encounter  Procedures  . EKG 12-Lead      Glenetta Hew, M.D., M.S. Interventional Cardiologist   Pager # [phone] Phone # [phone] [address]. [place], [place] [address]

## 2016-11-29 NOTE — Patient Instructions (Addendum)
Instruction for taking FLECAINIDE. IF YOU HAVE A SPELL LASTING LESS THAN 5 MIN  TAKE FLECAINIDE 50 MG TABLET  TWICE A DAY  FOR 3 DAYS.  IF YOUR SPELL LAST MORE THAN 5 MIN. TRY  VAGAL MANUVERS--  STRAINING AS IF YOU ARE HAVING A BOWEL MOVEMENT DRINKING ICE COLD  WATER TAKE A DEEP COUGH THEN TRY TAKING 2 ( 50 MG) FLECAINIDE TABLETS THEN IF LAST LONER THAN ANOTHER 10 MINS -- TAKE AN ADDITIONAL 2 (50 MG ) TABLETS AND WAIT ,IF CONTINUE FOR AN HOUR GO TO  EMERGENCY ROOM.  REGARDLESS TAKE FLECAINIDE 50 MG  TWICE A DAY FOR 3 DAYS AFTER EACH EPISODES     Your physician wants you to follow-up in: [place].-30 MIN  You will receive a reminder letter in the mail two months in advance. If you don't receive a letter, please call our office to schedule the follow-up appointment.  If you need a refill on your cardiac medications before your next appointment, please call your pharmacy.       Supraventricular Tachycardia, Adult Supraventricular tachycardia (SVT) is a kind of abnormal heartbeat. It makes your heart beat very fast and then beat at a normal speed. A normal heart beats 60-100 times a minute. This condition can make your heart beat more than 150 times a minute. Times of having a fast heartbeat (episodes) can be scary, but they are usually not dangerous. They can lead to problems if:  They happen often.  They last a long time. Symptoms of this condition include:  A pounding heart.  A feeling that your heart is skipping beats (palpitations).  Weakness.  Trouble getting enough air (shortness of breath).  Pain or tightness in your chest.  Feeling like you are going to pass out (light-headedness).  Feeling worried or nervous (anxiety).  Dizziness.  Sweating.  Feeling sick to your stomach (nausea).  Passing out (fainting).  Tiredness. Sometimes, there are no symptoms. Follow these instructions at home: Stress  Avoid things that make you feel  stressed.  Find out what helps you feel less stressed. Try:  Doing a relaxing activity, like yoga, meditation, or being out in nature.  Listening to relaxing music.  Doing relaxation techniques, like deep breathing.  Taking steps to be healthy. These include getting lots of sleep, exercising, and eating a balanced diet.  Talking with a mental health doctor. Sleep  Try to get at least 7 hours of sleep each night. Tobacco and nicotine  Do not use anything that has nicotine or tobacco, such as cigarettes and e-cigarettes. If you need help quitting, ask your doctor. Alcohol  If alcohol gives you a fast heartbeat, do not drink alcohol.  If alcohol does not seem to give you a fast heartbeat, limit your alcohol. For nonpregnant women, this means no more than 1 drink a day. For men, this means no more than 2 drinks a day. "One drink" means one of these:  12 oz of beer.  5 oz of wine.  1 oz of hard liquor. Caffeine  If caffeine gives you a fast heartbeat, do not eat, drink, or use anything with caffeine in it.  If caffeine does not seem to give you a fast heartbeat, limit how much caffeine you eat, drink, or use. Stimulant drugs  Do not use stimulant drugs. These are drugs like cocaine or methamphetamine. If you need help quitting, ask your doctor. General instructions  Stay at a healthy weight.  Exercise regularly. Ask  your doctor to suggest some good activities for you. Try one of these options:  150 minutes a week of gentle exercise, like walking or yoga.  75 minutes a week of exercise that is very active, like running or swimming.  A combination of gentle exercise and very active exercise.  Do home treatments to slow down your heartbeat as told by your doctor.  Take over-the-counter and prescription medicines only as told by your doctor. Contact a doctor if:  You have a fast heartbeat more often.  Times of having a fast heartbeat last longer than before.  Your  home treatments to slow down your heartbeat do not help.  You have new symptoms. Get help right away if:  You have chest pain.  Your symptoms get worse.  You have trouble breathing.  Your heart beats very fast for more than 20 minutes.  You pass out (faint). These symptoms may be an emergency. Do not wait to see if the symptoms will go away. Get medical help right away. Call your local emergency services (911 in the [place] Do not drive yourself to the hospital.  This information is not intended to replace advice given to you by your health care provider. Make sure you discuss any questions you have with your health care provider. Document Released: 12/16/2005 Document Revised: 08/22/2016 Document Reviewed: 08/22/2016 Elsevier Interactive Patient Education  2017 [employer].

## 2016-12-01 ENCOUNTER — Encounter: Payer: Self-pay | Admitting: Cardiology

## 2016-12-01 NOTE — Assessment & Plan Note (Signed)
I really think the pain is just is probably as much noncardiac and musculoskeletal as it is cardiac. There are sometimes when it may be related to an SVT. At this point were treated as such. He has had a negative Myoview is a cardiac catheterizations. I don't think there is any coronary ischemia. There could be some spasm component since there was some relief with nitroglycerin. He has not had these episodes very frequently, and is not likely to remember to take nitroglycerin with him. -> This would be an option in the future

## 2016-12-01 NOTE — Assessment & Plan Note (Addendum)
We have titrated up his beta blocker dose, however this was reduced because of nighttime bradycardia. Becomes very confusing when medications are changed. For now will continue 25 mg twice a day metoprolol with the ability to use an additional dose when necessary.  Provided patient information on SVT (draw out the pathways) -- spent 1 hr with the patient.  We will increase the when necessary dose of flecainide as described below: Instruction for taking FLECAINIDE. IF YOU HAVE A SPELL LASTING LESS THAN 5 MIN  TAKE FLECAINIDE 50 MG TABLET  TWICE A DAY  FOR 3 DAYS.  IF YOUR SPELL LAST MORE THAN 5 MIN. TRY  VAGAL MANUVERS--  STRAINING AS IF YOU ARE HAVING A BOWEL MOVEMENT DRINKING ICE COLD  WATER TAKE A DEEP COUGH THEN TRY TAKING 2 ( 50 MG) FLECAINIDE TABLETS THEN IF LAST LONER THAN ANOTHER 10 MINS -- TAKE AN ADDITIONAL 2 (50 MG ) TABLETS AND WAIT ,IF CONTINUE FOR AN HOUR GO TO  EMERGENCY ROOM.  REGARDLESS TAKE FLECAINIDE 50 MG  TWICE A DAY FOR 3 DAYS AFTER EACH EPISODES

## 2016-12-01 NOTE — Assessment & Plan Note (Deleted)
I really think the pain is just is probably as much noncardiac and musculoskeletal as it is cardiac. There are sometimes when it may be related to an SVT. At this point were treated as such. He has had a negative Myoview is a cardiac catheterizations. I don't think there is any coronary ischemia. There could be some spasm component since there was some relief with nitroglycerin. He has not had these episodes very frequently, and is not likely to remember to take nitroglycerin with him. -> This would be an option in the future

## 2016-12-01 NOTE — Assessment & Plan Note (Signed)
It would appear that he did have an episode where he may have passed out in November. I explained to him that if he is feeling miserable rapid heart rates and sharp chest discomfort, he should lie down or sit down on [place] walking around. If he is potentially driving a car he needs a bloatedness on the road. These episodes of always passed spontaneously.  Keep point is to remain hydrated, and to remember to use when necessary flecainide as described for SVT

## 2017-01-06 ENCOUNTER — Other Ambulatory Visit: Payer: Self-pay | Admitting: Nurse Practitioner

## 2017-01-06 ENCOUNTER — Ambulatory Visit
Admission: RE | Admit: 2017-01-06 | Discharge: 2017-01-06 | Disposition: A | Discharge status: Home / Self Care | Payer: Medicare Other | Source: Ambulatory Visit | Attending: Nurse Practitioner | Admitting: Nurse Practitioner

## 2017-01-06 DIAGNOSIS — R05 Cough: Secondary | ICD-10-CM

## 2017-01-06 DIAGNOSIS — R053 Chronic cough: Secondary | ICD-10-CM

## 2017-05-03 ENCOUNTER — Encounter (HOSPITAL_COMMUNITY): Payer: Self-pay

## 2017-05-03 ENCOUNTER — Observation Stay (HOSPITAL_COMMUNITY)
Admission: EM | Admit: 2017-05-03 | Discharge: 2017-05-04 | Disposition: A | Discharge status: Home / Self Care | Payer: Medicare Other | Attending: Internal Medicine | Admitting: Internal Medicine

## 2017-05-03 ENCOUNTER — Emergency Department (HOSPITAL_COMMUNITY): Payer: Medicare Other

## 2017-05-03 DIAGNOSIS — K219 Gastro-esophageal reflux disease without esophagitis: Secondary | ICD-10-CM | POA: Diagnosis not present

## 2017-05-03 DIAGNOSIS — Z7951 Long term (current) use of inhaled steroids: Secondary | ICD-10-CM | POA: Diagnosis not present

## 2017-05-03 DIAGNOSIS — G459 Transient cerebral ischemic attack, unspecified: Secondary | ICD-10-CM | POA: Diagnosis present

## 2017-05-03 DIAGNOSIS — G4700 Insomnia, unspecified: Secondary | ICD-10-CM | POA: Insufficient documentation

## 2017-05-03 DIAGNOSIS — G4733 Obstructive sleep apnea (adult) (pediatric): Secondary | ICD-10-CM | POA: Insufficient documentation

## 2017-05-03 DIAGNOSIS — E7800 Pure hypercholesterolemia, unspecified: Secondary | ICD-10-CM | POA: Insufficient documentation

## 2017-05-03 DIAGNOSIS — I208 Other forms of angina pectoris: Secondary | ICD-10-CM | POA: Diagnosis present

## 2017-05-03 DIAGNOSIS — Z91013 Allergy to seafood: Secondary | ICD-10-CM | POA: Diagnosis not present

## 2017-05-03 DIAGNOSIS — R0789 Other chest pain: Principal | ICD-10-CM | POA: Insufficient documentation

## 2017-05-03 DIAGNOSIS — K2270 Barrett's esophagus without dysplasia: Secondary | ICD-10-CM | POA: Diagnosis not present

## 2017-05-03 DIAGNOSIS — M7989 Other specified soft tissue disorders: Secondary | ICD-10-CM | POA: Diagnosis not present

## 2017-05-03 DIAGNOSIS — R079 Chest pain, unspecified: Secondary | ICD-10-CM | POA: Diagnosis present

## 2017-05-03 DIAGNOSIS — Z8249 Family history of ischemic heart disease and other diseases of the circulatory system: Secondary | ICD-10-CM | POA: Diagnosis not present

## 2017-05-03 DIAGNOSIS — J45909 Unspecified asthma, uncomplicated: Secondary | ICD-10-CM | POA: Diagnosis not present

## 2017-05-03 DIAGNOSIS — M1711 Unilateral primary osteoarthritis, right knee: Secondary | ICD-10-CM | POA: Insufficient documentation

## 2017-05-03 DIAGNOSIS — H9190 Unspecified hearing loss, unspecified ear: Secondary | ICD-10-CM | POA: Insufficient documentation

## 2017-05-03 DIAGNOSIS — R002 Palpitations: Secondary | ICD-10-CM | POA: Diagnosis present

## 2017-05-03 DIAGNOSIS — I471 Supraventricular tachycardia: Secondary | ICD-10-CM | POA: Diagnosis present

## 2017-05-03 LAB — BASIC METABOLIC PANEL
Anion gap: 7 (ref 5–15)
BUN: 14 mg/dL (ref 6–20)
CHLORIDE: 107 mmol/L (ref 101–111)
CO2: 22 mmol/L (ref 22–32)
CREATININE: 0.83 mg/dL (ref 0.61–1.24)
Calcium: 8.8 mg/dL — ABNORMAL LOW (ref 8.9–10.3)
GFR calc Af Amer: >60 mL/min (ref >60)
GFR calc non Af Amer: >60 mL/min (ref >60)
Glucose, Bld: 95 mg/dL (ref 65–99)
POTASSIUM: 4.6 mmol/L (ref 3.5–5.1)
SODIUM: 136 mmol/L (ref 135–145)

## 2017-05-03 LAB — CBC
HEMATOCRIT: 39.9 % (ref 39.0–52.0)
Hemoglobin: 13.4 g/dL (ref 13.0–17.0)
MCH: 30.7 pg (ref 26.0–34.0)
MCHC: 33.6 g/dL (ref 30.0–36.0)
MCV: 91.5 fL (ref 78.0–100.0)
PLATELETS: 210 10*3/uL (ref 150–400)
RBC: 4.36 MIL/uL (ref 4.22–5.81)
RDW: 12.9 % (ref 11.5–15.5)
WBC: 5.2 10*3/uL (ref 4.0–10.5)

## 2017-05-03 LAB — TROPONIN I: Troponin I: <0.03 ng/mL (ref <0.03)

## 2017-05-03 LAB — I-STAT TROPONIN, ED: Troponin i, poc: 0 ng/mL (ref 0.00–0.08)

## 2017-05-03 LAB — MAGNESIUM: Magnesium: 2.2 mg/dL (ref 1.7–2.4)

## 2017-05-03 MED ORDER — NITROGLYCERIN 0.4 MG SL SUBL: 0.4000 mg | SUBLINGUAL_TABLET | SUBLINGUAL | Status: DC | PRN

## 2017-05-03 MED ORDER — ENOXAPARIN SODIUM 40 MG/0.4ML ~~LOC~~ SOLN
40.0000 mg | SUBCUTANEOUS | Status: DC
  Administered 2017-05-03: 40 mg via SUBCUTANEOUS
  Filled 2017-05-03: qty 0.4

## 2017-05-03 MED ORDER — OXYCODONE HCL 5 MG PO TABS: 5.0000 mg | ORAL_TABLET | ORAL | Status: DC | PRN

## 2017-05-03 MED ORDER — ACETAMINOPHEN 650 MG RE SUPP: 650.0000 mg | Freq: Four times a day (QID) | RECTAL | Status: DC | PRN

## 2017-05-03 MED ORDER — ACETAMINOPHEN 325 MG PO TABS
650.0000 mg | ORAL_TABLET | Freq: Four times a day (QID) | ORAL | Status: DC | PRN
  Administered 2017-05-04: 650 mg via ORAL
  Filled 2017-05-03: qty 2

## 2017-05-03 MED ORDER — METOPROLOL TARTRATE 25 MG PO TABS
25.0000 mg | ORAL_TABLET | Freq: Two times a day (BID) | ORAL | Status: DC
  Administered 2017-05-03 – 2017-05-04 (×2): 25 mg via ORAL
  Filled 2017-05-03 (×2): qty 1

## 2017-05-03 MED ORDER — ASPIRIN 81 MG PO CHEW
324.0000 mg | CHEWABLE_TABLET | Freq: Once | ORAL | Status: AC
  Administered 2017-05-03: 324 mg via ORAL
  Filled 2017-05-03: qty 4

## 2017-05-03 MED ORDER — ASPIRIN EC 81 MG PO TBEC
81.0000 mg | DELAYED_RELEASE_TABLET | Freq: Every day | ORAL | Status: DC
  Administered 2017-05-04: 81 mg via ORAL
  Filled 2017-05-03: qty 1

## 2017-05-03 NOTE — ED Notes (Signed)
Sam will be here around 1130AM

## 2017-05-03 NOTE — H&P (Signed)
Date: 05/03/2017               Patient Name:  Nathaniel [name] MRN: [id number]  DOB: 1961/11/02 Age / Sex: 56 y.o., male   PCP: Helane Rima, MD         Medical Service: Internal Medicine Teaching Service         Attending Physician: Dr. Bartholomew Crews, MD    First Contact: Dr. Jari Favre Pager: [phone]  Second Contact: Dr. Benjamine Mola Pager: [phone]       After Hours (After 5p/  First Contact Pager: [phone]  weekends / holidays): Second Contact Pager: [phone]   Chief Complaint: chest pain, calf pain  History of Present Illness:  Nathaniel [name] is a 56yo male with PMH of SVT, HLD, OSA, Barrett's esophagus presenting to [facility] with complaints of chest pain since last night that woke him up. Patient states that the pain has been consistent since then. Pain is worse with exertion and he endorses shortness of breath with these exacerbations. He endorses radiation of pain into his left neck and shoulder and endorses that his left arm feels different. He endorses palpitations similar to his prior SVT episodes; he states compliance with his metoprolol and has taken his Flecainide last night without relief of his chest discomfort.   Patient also endorses a 2 day history of left calf discomfort that began while walking and did not relieve with rest. He endorses some swelling at the calf as well. He denies trauma, recent surgeries or immobilization, known cancer, history of blood clots. He denies cough, sputum production, fevers, chills.   Patient is deaf, a sign language interpreter was used to help facilitate interview and exam.  Meds:  Current Meds  Medication Sig  . flecainide (TAMBOCOR) 50 MG tablet Take 1 tablet (50 mg total) by mouth 2 (two) times daily. KEEP OV.  Marland Kitchen Fluticasone-Salmeterol (ADVAIR) 250-50 MCG/DOSE AEPB Inhale 1 puff into the lungs daily.  . metoprolol tartrate (LOPRESSOR) 25 MG tablet Take 1 tablet (25 mg total) by mouth 2 (two) times daily.  . [DISCONTINUED]  fluticasone (FLONASE) 50 MCG/ACT nasal spray Place 1 spray into both nostrils daily.   Allergies: Allergies as of 05/03/2017 - Review Complete 05/03/2017  Allergen Reaction Noted  . Prednisone Palpitations 10/21/2016  . Shellfish-derived products Itching and Rash 11/14/2015  . Shrimp [shellfish allergy] Rash and Itching 10/19/2012  . Simvastatin Other (See Comments) 03/01/2013  . Tamsulosin Other (See Comments) 06/20/2015  . Fish allergy Rash and Other (See Comments) 11/02/2012  . Fish-derived products Rash and Other (See Comments) 11/14/2015   Past Medical History:  Diagnosis Date  . Allergy   . Arthritis    right knee  . Asthma    prn inhaler  . Barrett's esophagus 10-25-14 pt denies   states "minor"  . Chest pain    a. 06/2016 MV: EF 52%, no ischemia or infarct.  . 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 Deaf    Call [phone] for patient's sign language interpreter.   Marland Kitchen GERD (gastroesophageal reflux disease)   . Heartburn    occasional, related to certain foods  . High cholesterol   . Intermittent palpitations 10-25-14 patient denies   a. Event Monitor 04/2014: Mostly NSR - intermittent PACs, - 2 brief runs of PAT/PSVT  . PSVT (paroxysmal supraventricular tachycardia) ([place])    a. 04/2014 Event Monitor: brief PAT/PSVT-->uses prn flecainide.  . Seasonal allergies   . Stroke [facility]) 2013   TIA  .  TIA (transient ischemic attack) 10-25-14 pt denies   no current deficits  . Vasovagal syncope     Family History: Brother had MI at age 25; uncle died of MI at 51; father with MI in 23's.   Social History: Patient denies tobacco, alcohol or illicit drug use. He works as a Development worker, international aid.   Review of Systems: A complete ROS was negative except as per HPI.   Physical Exam: Blood pressure 116/75, pulse (!) 51, temperature 98.4 F (36.9 C), temperature source Oral, resp. rate (!) 29, SpO2 99 %. General: alert, well-developed, and cooperative to examination.  Head: normocephalic and  atraumatic.  Eyes: vision grossly intact, pupils equal, pupils round, pupils reactive to light, no injection and anicteric.  Mouth: moist mucous membranes.  Neck: supple, full ROM, no JVD.  Lungs: normal respiratory effort, no accessory muscle use, normal breath sounds, no crackles, and no wheezes. Heart: normal rate, regular rhythm, no murmur, no gallop, and no rub.  Abdomen: soft, non-tender, normal bowel sounds, no distention.  Msk: no joint swelling, no joint warmth, and no redness over joints. Calves are equal in size, without erythema; L calf with some tenderness to palpation.  Pulses: 2+ DP/PT pulses bilaterally Extremities: No cyanosis, clubbing, edema Neurologic: alert & oriented X3, cranial nerves II-XII intact, strength normal in all extremities, sensation intact to light touch.  Skin: turgor normal and no rashes.  Psych: Oriented X3, memory intact for recent and remote, normally interactive, good eye contact, not anxious appearing, and not depressed appearing  LABS: Na 136, K 4.6, Ch 107, CO2 22, BUN 14, Cr 0.83, Glu 95 WBC 5.2, Hgb 13.4, Hct 39.9, Plts 210 iStat trop negative  EKG: Personally reviewed - sinus rhythm, nonspecific ST segment elevations that are consistent with prior EKGs  CXR: Personally reviewed - no active cardiopulmonary disease  Myoview 7/17: EF 52%, No reversible ischemia, no wall motion abnormality  Cardiac catheterization in 2012: no coronary artery disease  Assessment & Plan by Problem: Active Problems:   Chest pain  Atypical chest pain:  Patient with sudden onset chest pain last night that has not resolved; worsened with exertion and associated with shortness of breath on exertion. Pain with left neck/shoulder radiation. Pain is not reproducible on exam; EKG is unchanged from priors, he is in sinus rhythm, and initial troponin is negative. CXR is negative for pneumonia, vital signs are stable. Patient also complains of calf pain and swelling that  was unimpressive on exam so low suspicion for DVT and PE contributing to his current symptoms. Prior ischemic workups have been negative but patient does have strong family history and some risk factors. --telemetry --trend trops --EKG prn --s/p ASA 324mg   --Continue metorpolol 25mg  BID, asa 81mg  daily --nitro PRN --consider cards consult in AM for stress test  Calf pain: Patient with 2 days of calf pain that began with walking and is unrelieved by rest. Exam is not concerning for DVT. Wells score is 1. --continue monitoring  Diet: regular VTE ppx: lovenox  Code: FULL  Dispo: Admit patient to Observation with expected length of stay less than 2 midnights.  Signed: Alphonzo Grieve, MD 05/03/2017, 2:53 PM  Pager [phone]

## 2017-05-03 NOTE — ED Triage Notes (Signed)
Pt from home with CP and left sided weakness. Pt last know well 1030 pm 05-02-17. Pt states it started with left leg numb and went all the way to heart.

## 2017-05-03 NOTE — ED Notes (Signed)
Admitting MD at bedside.

## 2017-05-03 NOTE — ED Provider Notes (Signed)
[facility] DEPT Provider Note   CSN: [id number] Arrival date & time: 05/03/17  1104     History   Chief Complaint Chief Complaint  Patient presents with  . Chest Pain  . Weakness    HPI Nathaniel [name] is a 56 y.o. male.  HPI  The patient is a 56 year old male, he is deaf, he uses a silent which interpreter, he also has a history of paroxysmal SVT, atrial fibrillation, he has been placed on flecainide as well as metoprolol, he takes no other daily medications. He reports that he sees Dr. Ellyn Hack with cardiology, he has never been told that he had any heart disease however over the last 2 days he has had increased pain with exertion, he usually walks 1 mile but lately is having discomfort with this activity and this morning it got worse propping him to call 911. At this time the patient is still having symptoms, he also reports some pain in his left leg. The pain started last night.  Past Medical History:  Diagnosis Date  . Allergy   . Arthritis    right knee  . Asthma    prn inhaler  . Barrett's esophagus 10-25-14 pt denies   states "minor"  . Chest pain    a. 06/2016 MV: EF 52%, no ischemia or infarct.  . 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 Deaf    Call [phone] for patient's sign language interpreter.   Marland Kitchen GERD (gastroesophageal reflux disease)   . Heartburn    occasional, related to certain foods  . High cholesterol   . Intermittent palpitations 10-25-14 patient denies   a. Event Monitor 04/2014: Mostly NSR - intermittent PACs, - 2 brief runs of PAT/PSVT  . PSVT (paroxysmal supraventricular tachycardia) ([place])    a. 04/2014 Event Monitor: brief PAT/PSVT-->uses prn flecainide.  . Seasonal allergies   . Stroke [facility]) 2013   TIA  . TIA (transient ischemic attack) 10-25-14 pt denies   no current deficits  . Vasovagal syncope     Patient Active Problem List   Diagnosis Date Noted  . High cholesterol   . Pain in the chest   . Syncope 05/11/2015  . Chest  pain of uncertain etiology 44/02/4741  . Sigmoid polyp 11/03/2014  . Barrett's esophagus 10/19/2014  . OSA (obstructive sleep apnea) 07/15/2014  . SVT (supraventricular tachycardia) ([place]) 06/18/2014  . Insomnia 06/18/2014  . Palpitations 05/19/2014  . Normal coronary arteries- 2006 and June 2012 05/19/2014  . PVC (premature ventricular contraction) 05/19/2014  . Chest pain 05/19/2014  . Dyspnea 05/19/2014  . Unspecified visual disturbance 11/02/2013  . Metatarsalgia of right foot 08/20/2013  . Hyperlipidemia 12/22/2012  . Hearing impaired 12/21/2012  . TIA (transient ischemic attack) 12/20/2012  . CVA (cerebral infarction) 12/20/2012  . History of asthma 12/20/2012  . HEARTBURN 10/17/2009    Past Surgical History:  Procedure Laterality Date  . Abdominal Aortic Ultrasound  2012   normal abdominal aorta  . BUNIONECTOMY Left 2012   right done also  . CARDIAC CATHETERIZATION  06/24/2011   wwidely patent normal coronary arteries with normal ejection fraction  . Carotid Artery Dopplers  2012   tortuous but no stenoses.. No subclavian disease  . COLONOSCOPY  2015  . COLONOSCOPY WITH PROPOFOL N/A 11/03/2014   Procedure: COLONOSCOPY WITH PROPOFOL;  Surgeon: Milus Banister, MD;  Location: [facility];  Service: Endoscopy;  Laterality: N/A;  . Foot Implant Removal Right 07/15/2013   @ [place]  . INGUINAL HERNIA REPAIR    .  KNEE ARTHROSCOPY Left 06/02/2012  . KNEE ARTHROSCOPY Right fall 2013   x 2  . KNEE ARTHROSCOPY Left 03/03/2013   Procedure: ARTHROSCOPY KNEE WITH CHONDROPLASTY PATELLA  AND LATERAL TIBIAL PLATEAU;  Surgeon: Kerin Salen, MD;  Location: [place] [facility];  Service: Orthopedics;  Laterality: Left;  . NM MYOVIEW LTD  2012   small fixed basal inferior artifact. Normal EF.  Marland Kitchen TRANSTHORACIC ECHOCARDIOGRAM  June2015   nnormal EF: 55-60%. No wall motion abnormalities. Gr 1 DD.  Mild MR. Otherwise normal  . Upper and Lower Extremity Arterial Dopplers  2012   normal  arterial flow bilateral upper extremities including subclavian is a carotid;;R. ABI 1.2, L. ABI 1.28. Normal flow velocities. Likely calcified vessels.  Marland Kitchen UPPER GASTROINTESTINAL ENDOSCOPY  2013       Home Medications    Prior to Admission medications   Medication Sig Start Date End Date Taking? Authorizing Provider  flecainide (TAMBOCOR) 50 MG tablet Take 1 tablet (50 mg total) by mouth 2 (two) times daily. KEEP OV. 11/25/16  Yes Leonie Man, MD  Fluticasone-Salmeterol (ADVAIR) 250-50 MCG/DOSE AEPB Inhale 1 puff into the lungs daily. 03/06/16  Yes Gareth Morgan, MD  metoprolol tartrate (LOPRESSOR) 25 MG tablet Take 1 tablet (25 mg total) by mouth 2 (two) times daily. 07/10/16  Yes Strader, [place] M, PA-C  albuterol (PROVENTIL HFA;VENTOLIN HFA) 108 (90 BASE) MCG/ACT inhaler Inhale 1-2 puffs into the lungs every 6 (six) hours as needed for wheezing or shortness of breath. 04/16/15   Pamella Pert, MD  albuterol (PROVENTIL) (2.5 MG/3ML) 0.083% nebulizer solution Take 3 mLs (2.5 mg total) by nebulization every 4 (four) hours as needed for wheezing or shortness of breath. 03/06/16   Gareth Morgan, MD  esomeprazole (NEXIUM) 40 MG capsule Take 1 capsule (40 mg total) by mouth daily at 12 noon. Patient not taking: Reported on 05/03/2017 08/20/16   Milus Banister, MD    Family History Family History  Problem Relation Age of Onset  . Ovarian cancer Mother   . Coronary artery disease Father   . Heart attack Father 6  . Heart attack Brother 3  . Heart attack Paternal Grandfather 45  . Colon cancer Neg Hx   . Stomach cancer Neg Hx   . Esophageal cancer Neg Hx   . Rectal cancer Neg Hx     Social History Social History  Substance Use Topics  . Smoking status: Never Smoker  . Smokeless tobacco: Never Used  . Alcohol use 0.0 oz/week     Comment: occ     Allergies   Prednisone; Shellfish-derived products; Shrimp [shellfish allergy]; Simvastatin; Tamsulosin; Fish allergy; and  Fish-derived products   Review of Systems Review of Systems  All other systems reviewed and are negative.    Physical Exam Updated Vital Signs BP 110/63   Pulse 68   Temp 97.9 F (36.6 C) (Oral)   Resp 20   Ht 5\' 11"  (1.803 m)   SpO2 97%   Physical Exam  Constitutional: He appears well-developed and well-nourished. No distress.  HENT:  Head: Normocephalic and atraumatic.  Mouth/Throat: Oropharynx is clear and moist. No oropharyngeal exudate.  Eyes: Conjunctivae and EOM are normal. Pupils are equal, round, and reactive to light. Right eye exhibits no discharge. Left eye exhibits no discharge. No scleral icterus.  Neck: Normal range of motion. Neck supple. No JVD present. No thyromegaly present.  Cardiovascular: Normal rate, regular rhythm, normal heart sounds and intact distal pulses.  Exam reveals  no gallop and no friction rub.   No murmur heard. Pulmonary/Chest: Effort normal and breath sounds normal. No respiratory distress. He has no wheezes. He has no rales. He exhibits no tenderness.  Abdominal: Soft. Bowel sounds are normal. He exhibits no distension and no mass. There is no tenderness.  Musculoskeletal: Normal range of motion. He exhibits no edema or tenderness.  Lymphadenopathy:    He has no cervical adenopathy.  Neurological: He is alert. Coordination normal.  The patient's speech is inhibited secondary to his deafness however he is able to use both of his arms to do sign language as per his norm, no facial droop, follows commands without difficulty  Skin: Skin is warm and dry. No rash noted. No erythema.  Psychiatric: He has a normal mood and affect. His behavior is normal.  Nursing note and vitals reviewed.   ED Treatments / Results  Labs (all labs ordered are listed, but only abnormal results are displayed) Labs Reviewed  BASIC METABOLIC PANEL - Abnormal; Notable for the following:       Result Value   Calcium 8.8 (*)    All other components within normal  limits  BASIC METABOLIC PANEL - Abnormal; Notable for the following:    Glucose, Bld 107 (*)    Calcium 8.7 (*)    All other components within normal limits  CBC  HIV ANTIBODY (ROUTINE TESTING)  MAGNESIUM  TROPONIN I  TROPONIN I  CBC  I-STAT TROPOININ, ED    EKG  EKG Interpretation  Date/Time:  Saturday May 03 2017 11:26:48 EDT Ventricular Rate:  55 PR Interval:    QRS Duration: 91 QT Interval:  428 QTC Calculation: 410 R Axis:   21 Text Interpretation:  Sinus rhythm Minimal ST elevation, anterior leads Since last tracing ST changes are new Confirmed by Karlin Binion  MD, Mattew Chriswell ([id number]) on 05/03/2017 11:43:15 AM       Radiology Dg Chest 2 View  Result Date: 05/03/2017 CLINICAL DATA:  Chest pain. EXAM: CHEST  2 VIEW COMPARISON:  Radiographs of January 06, 2017. FINDINGS: The heart size and mediastinal contours are within normal limits. Both lungs are clear. No pneumothorax or pleural effusion is noted. The visualized skeletal structures are unremarkable. IMPRESSION: No active cardiopulmonary disease. Electronically Signed   By: Marijo Conception, M.D.   On: 05/03/2017 12:22    Procedures Procedures (including critical care time)  Medications Ordered in ED Medications  metoprolol tartrate (LOPRESSOR) tablet 25 mg (25 mg Oral Given 05/04/17 0919)  enoxaparin (LOVENOX) injection 40 mg (40 mg Subcutaneous Given 05/03/17 1831)  acetaminophen (TYLENOL) tablet 650 mg (650 mg Oral Given 05/04/17 0920)    Or  acetaminophen (TYLENOL) suppository 650 mg ( Rectal See Alternative 05/04/17 0920)  oxyCODONE (Oxy IR/ROXICODONE) immediate release tablet 5 mg (not administered)  nitroGLYCERIN (NITROSTAT) SL tablet 0.4 mg (not administered)  aspirin EC tablet 81 mg (81 mg Oral Given 05/04/17 0919)  aspirin chewable tablet 324 mg (324 mg Oral Given 05/03/17 1218)  diphenhydrAMINE (BENADRYL) capsule 25 mg (25 mg Oral Given 05/04/17 0919)     Initial Impression / Assessment and Plan / ED Course  I have reviewed  the triage vital signs and the nursing notes.  Pertinent labs & imaging results that were available during my care of the patient were reviewed by me and considered in my medical decision making (see chart for details).     The patient has a slightly abnormal EKG with mild ST elevations however I do not  see any signs of reciprocal changes, I do not see any PR depression, he does report that the pain is slightly worse when he leans forward, he is also worse with exertion raising the concern for obstructive disease. The patient will need to be admitted to the hospital for further evaluation.  ASA given  Trop and labs neg, Pt needs admit due to angina type syndrome which is new and at rest D/w inpatient medical team who will admit  Final Clinical Impressions(s) / ED Diagnoses   Final diagnoses:  Angina at rest [facility])    New Prescriptions Current Discharge Medication List       Noemi Chapel, MD 05/04/17 1044

## 2017-05-04 ENCOUNTER — Encounter (HOSPITAL_COMMUNITY): Payer: Self-pay | Admitting: Internal Medicine

## 2017-05-04 DIAGNOSIS — G459 Transient cerebral ischemic attack, unspecified: Secondary | ICD-10-CM

## 2017-05-04 DIAGNOSIS — Z888 Allergy status to other drugs, medicaments and biological substances status: Secondary | ICD-10-CM | POA: Diagnosis not present

## 2017-05-04 DIAGNOSIS — K2270 Barrett's esophagus without dysplasia: Secondary | ICD-10-CM

## 2017-05-04 DIAGNOSIS — Z91013 Allergy to seafood: Secondary | ICD-10-CM

## 2017-05-04 DIAGNOSIS — K219 Gastro-esophageal reflux disease without esophagitis: Secondary | ICD-10-CM

## 2017-05-04 DIAGNOSIS — R002 Palpitations: Secondary | ICD-10-CM

## 2017-05-04 DIAGNOSIS — I471 Supraventricular tachycardia: Secondary | ICD-10-CM | POA: Diagnosis not present

## 2017-05-04 DIAGNOSIS — R0789 Other chest pain: Secondary | ICD-10-CM

## 2017-05-04 LAB — BASIC METABOLIC PANEL
ANION GAP: 7 (ref 5–15)
BUN: 16 mg/dL (ref 6–20)
CO2: 25 mmol/L (ref 22–32)
Calcium: 8.7 mg/dL — ABNORMAL LOW (ref 8.9–10.3)
Chloride: 107 mmol/L (ref 101–111)
Creatinine, Ser: 0.97 mg/dL (ref 0.61–1.24)
GFR calc Af Amer: >60 mL/min (ref >60)
GLUCOSE: 107 mg/dL — AB (ref 65–99)
Potassium: 3.6 mmol/L (ref 3.5–5.1)
Sodium: 139 mmol/L (ref 135–145)

## 2017-05-04 LAB — CBC
HCT: 39.8 % (ref 39.0–52.0)
HEMOGLOBIN: 13.1 g/dL (ref 13.0–17.0)
MCH: 30.5 pg (ref 26.0–34.0)
MCHC: 32.9 g/dL (ref 30.0–36.0)
MCV: 92.6 fL (ref 78.0–100.0)
Platelets: 182 10*3/uL (ref 150–400)
RBC: 4.3 MIL/uL (ref 4.22–5.81)
RDW: 12.9 % (ref 11.5–15.5)
WBC: 5.9 10*3/uL (ref 4.0–10.5)

## 2017-05-04 LAB — TROPONIN I

## 2017-05-04 LAB — HIV ANTIBODY (ROUTINE TESTING W REFLEX): HIV Screen 4th Generation wRfx: NONREACTIVE

## 2017-05-04 MED ORDER — DIPHENHYDRAMINE HCL 25 MG PO CAPS
25.0000 mg | ORAL_CAPSULE | Freq: Once | ORAL | Status: AC
  Administered 2017-05-04: 25 mg via ORAL
  Filled 2017-05-04: qty 1

## 2017-05-04 MED ORDER — FLECAINIDE ACETATE 50 MG PO TABS
50.0000 mg | ORAL_TABLET | Freq: Two times a day (BID) | ORAL | 1 refills | Status: DC
Start: 2017-05-04 — End: 2017-09-24

## 2017-05-04 MED ORDER — OMEPRAZOLE 40 MG PO CPDR: 40.0000 mg | DELAYED_RELEASE_CAPSULE | Freq: Every day | ORAL | 2 refills | Status: DC

## 2017-05-04 MED ORDER — ASPIRIN 81 MG PO TBEC: 81.0000 mg | DELAYED_RELEASE_TABLET | Freq: Every day | ORAL | 2 refills | Status: DC

## 2017-05-04 NOTE — Progress Notes (Signed)
  Date: 05/04/2017  Patient name: Nathaniel [name]  Medical record number: [id number]  Date of birth: 1961/07/11   I have seen and evaluated Nathaniel [name] and discussed their care with the Residency Team. Nathaniel [name] is a 56 year old gentleman with a strong family history of CAD. He had a chief complaint of chest pain. He ruled out for acute MI with EKGs and troponins. Additionally, he had a stress test in July 2017 which was low risk. He had a cardiac catheterization in 2012 which showed normal coronary arteries without obstructive disease by Dr. Claiborne Billings. He has history of atypical chest pain and follows with a cardiologist for his SVT.  He has a history of Barrett's esophagus with a most recent EGD and biopsies in January 2017 which confirmed Barrett's without dysplasia. All of his medication list by his physicians indicate he is taking a PPI but today he told us that when he was changed from brand name Nexium to generic, the generic did not work and he started using baking soda and water to treat his acid reflux.  PMHx, Fam Hx, and/or Soc Hx : Past medical history includes OSA and hyperlipidemia. His brother had an MI at age 69, his father MI in his 14s, and he had an uncle who passed away of an acute MI at the age of 37. The patient does not smoke, he is married, and he works as a Development worker, international aid.  Vitals:   05/04/17 0919 05/04/17 1419  BP: 110/63 120/85  Pulse: 68 72  Resp:    Temp:  98 F (36.7 C)  RR 20 T max 98 General no acute distress sitting on the side of the bed  I independently reviewed his EKGs which showed sinus bradycardia with a heart rate of 52, normal axis, and significant ST changes  I independently reviewed his chest x-ray which is a PA and lateral and showed no abnormalities  Assessment and Plan: I have seen and evaluated the patient as outlined above. I agree with the formulated Assessment and Plan as detailed in the residents' note, with the following changes:   1.  Atypical chest pain - we have ruled out either by history, blood work, or imaging the following diagnosis - acute MI, pericarditis, pneumothorax, aortic dissection, PE, esophageal rupture. There is a possibility that his symptoms are due to untreated GERD as she was not getting relief from his generic PPI. We are going to put him on a PPI and encouraged him to follow up with his outpatient cardiologist, PCP, and gastroenterologist.  2. Barrett's esophagus - PPI. F/U GI.  D/C home today  Bartholomew Crews, MD 5/6/20182:44 PM

## 2017-05-04 NOTE — Discharge Summary (Signed)
Name: Nathaniel [name] MRN: [id number] DOB: 1961/02/12 56 y.o. PCP: Helane Rima, MD  Date of Admission: 05/03/2017 11:04 AM Date of Discharge: 05/04/2017 Attending Physician: Bartholomew Crews, MD  Discharge Diagnosis: Non-cardiac chest pain Principal Problem:   Chest pain Active Problems:   TIA (transient ischemic attack)   Palpitations   SVT (supraventricular tachycardia) ([place])   Discharge Medications: Allergies as of 05/04/2017      Reactions   Prednisone Palpitations   Shellfish-derived Products Itching, Rash   Shrimp [shellfish Allergy] Rash, Itching   Simvastatin Other (See Comments)   DEPRESSION, VISUAL DISTURBANCE(FLASHING LIGHTS)   Tamsulosin Other (See Comments)   Other reaction(s): Other (See Comments) Changes in behavior  Changes in behavior  Changes in behavior    Fish Allergy Rash, Other (See Comments)   SOFTSHELL FISH   Fish-derived Products Rash, Other (See Comments)   SOFTSHELL FISH SOFTSHELL FISH      Medication List    STOP taking these medications   esomeprazole 40 MG capsule Commonly known as:  [place] these medications   albuterol 108 (90 Base) MCG/ACT inhaler Commonly known as:  PROVENTIL HFA;VENTOLIN HFA Inhale 1-2 puffs into the lungs every 6 (six) hours as needed for wheezing or shortness of breath.   albuterol (2.5 MG/3ML) 0.083% nebulizer solution Commonly known as:  PROVENTIL Take 3 mLs (2.5 mg total) by nebulization every 4 (four) hours as needed for wheezing or shortness of breath.   aspirin 81 MG EC tablet Take 1 tablet (81 mg total) by mouth daily. Start taking on:  05/05/2017   flecainide 50 MG tablet Commonly known as:  TAMBOCOR Take 1 tablet (50 mg total) by mouth 2 (two) times daily. KEEP OV.   Fluticasone-Salmeterol 250-50 MCG/DOSE Aepb Commonly known as:  ADVAIR Inhale 1 puff into the lungs daily.   metoprolol tartrate 25 MG tablet Commonly known as:  LOPRESSOR Take 1 tablet (25 mg total) by mouth 2  (two) times daily.   omeprazole 40 MG capsule Commonly known as:  PRILOSEC Take 1 capsule (40 mg total) by mouth daily. Take 30 minutes before first meal of the day.       Disposition and follow-up:   Mr.Nathaniel [name] was discharged from [facility] in Stable condition.  At the hospital follow up visit please address:  1.   Chest pain: --Has patient had recurrence of chest pain? --has he followed up with his cardiologist?  Barrett's esophagus, GERD:  --started on omeprazole 40mg  daily - has he been taking it?  --change in symptoms?   2.  Labs / imaging needed at time of follow-up: none  3.  Pending labs/ test needing follow-up: none  Follow-up Appointments:   Hospital Course by problem list: Principal Problem:   Chest pain Active Problems:   TIA (transient ischemic attack)   Palpitations   SVT (supraventricular tachycardia) (HCC)   Non-cardiac chest pain: Patient with past medical history of OSA, SVT presented with sudden onset of chest pain for admission that is worse with exertion and associated with some shortness of breath; pain radiates to his left neck and shoulder. Patient has significant family cardiac history but no personal history of CAD. On admissions patient vital signs were unremarkable, EKG showed some diffuse mild ST elevation that was unchanged from priors - he was in sinus rhythm; chest x-ray was negative for acute cardiopulmonary process. Patient had complained of some calf pain that was initially described as sudden onset  2 days ago but had in actuality been going on for some time and presented after possibly a nerve block for a foot procedure -exam of this was unremarkable and there was low probability of DVT and in turn PE as to the cause of his symptoms. Patient was maintained on telemetry and troponins were trended; these were negative. Patient had a stress test in July 2017 which was low risk; has had a cardiac cath in 2012 that  showed widely patent coronary arteries. Patient had relief of symptoms after Tylenol; patient has a history of Barrett's esophagus and GERD is currently not being treated with PPIs. It is likely that this may be contributing to his chest pain. Patient was started on PPI and discharged home with instruction to follow up with his cardiologist for further management of his SVT.   As discussed above is not felt that his calf pain was due to DVT; he was advised to follow-up with his podiatrist and PCP.   Discharge Vitals:   BP 120/85 (BP Location: Right Arm)   Pulse 72   Temp 98 F (36.7 C) (Oral)   Resp 20   Ht 5\' 11"  (1.803 m)   SpO2 97%   Pertinent Labs, Studies, and Procedures:  CBC Latest Ref Rng & Units 05/04/2017 05/03/2017 11/17/2016  WBC 4.0 - 10.5 K/uL 5.9 5.2 7.8  Hemoglobin 13.0 - 17.0 g/dL 13.1 13.4 13.5  Hematocrit 39.0 - 52.0 % 39.8 39.9 40.1  Platelets 150 - 400 K/uL 182 210 185   BMP Latest Ref Rng & Units 05/04/2017 05/03/2017 11/17/2016  Glucose 65 - 99 mg/dL 107(H) 95 99  BUN 6 - 20 mg/dL 16 14 16   Creatinine 0.61 - 1.24 mg/dL 0.97 0.83 1.32(H)  Sodium 135 - 145 mmol/L 139 136 139  Potassium 3.5 - 5.1 mmol/L 3.6 4.6 3.7  Chloride 101 - 111 mmol/L 107 107 108  CO2 22 - 32 mmol/L 25 22 24   Calcium 8.9 - 10.3 mg/dL 8.7(L) 8.8(L) 8.9   Troponin: neg x3 HIV: nonreactive CXR: no active cardiopulmonary disease  Discharge Instructions: Discharge Instructions    Call MD for:  difficulty breathing, headache or visual disturbances    Complete by:  As directed    Call MD for:  extreme fatigue    Complete by:  As directed    Call MD for:  hives    Complete by:  As directed    Call MD for:  persistant dizziness or light-headedness    Complete by:  As directed    Call MD for:  persistant nausea and vomiting    Complete by:  As directed    Call MD for:  redness, tenderness, or signs of infection (pain, swelling, redness, odor or green/yellow discharge around incision site)     Complete by:  As directed    Call MD for:  severe uncontrolled pain    Complete by:  As directed    Call MD for:  temperature >100.4    Complete by:  As directed    Diet - low sodium heart healthy    Complete by:  As directed    Discharge instructions    Complete by:  As directed    You were admitted to the hospital for chest pain. Our workup did not show that it was caused by your heart. Your pain might be coming from your stomach acid or from blood vessel spasms.   Please start taking omeprazole 40mg  (one pill) once a day, 30  minutes before your first meal of the day.   Please follow up with your heart doctor.   Increase activity slowly    Complete by:  As directed       Signed: Alphonzo Grieve, MD 05/04/2017, 2:45 PM   Pager [phone]

## 2017-05-04 NOTE — Progress Notes (Signed)
   Subjective:   Sign language interpreter was used to help facilitate interview and answer patient's questions.  Patient states that his pain resolved last night after a pill was given to him and he has not had recurrence of the pain. He endorses feeling well today.   In regards to his Barrett's esophagus, patient states that he was on a PPI which was helping his symptoms, however had to be switched to generic which did not help. Since then he has been using sodium bicarb.   Objective:  Vital signs in last 24 hours: Vitals:   05/03/17 1950 05/03/17 2216 05/04/17 0446 05/04/17 0919  BP: 114/61 121/74 107/65 110/63  Pulse: 70 80 73 68  Resp: 18  20   Temp: 98.1 F (36.7 C)  97.9 F (36.6 C)   TempSrc: Oral  Oral   SpO2: 97%  97%   Height:   5\' 11"  (1.803 m)    Constitutional: NAD, pleasant Resp: no increased work of breathing, CTAB CV: RRR, no murmur, rubs or gallops, pulses intact Msk: no edema or calf tenderness  Assessment/Plan:  Principal Problem:   Chest pain Active Problems:   TIA (transient ischemic attack)   Palpitations   SVT (supraventricular tachycardia) (HCC)  Non cardiac chest pain:  Patient with no ischemic EKG changes, negative troponins, no arrythmia even while actively having chest pain, negative stress test 7/17, and clean cardiac cath in 2012. Patient with resolution of pain last night after tylenol/baby aspirin administration (had already received 324mg  ASA). Other etiologies could be vasospasm or GERD in setting of non-optimally treated Barrett's esophagus. Patient is safe for discharge with follow up with his cardiologist. We will restart a PPI; no change to his home metoprolol or flecainide.  --f/u with cards outpatient --start pantoprazole --continue metoprolol 25mg  BID; flecainide 50mg  BID x3 days prn  Calf pain: Patient with 2 days of calf pain that began with walking but on re-addressing he has had this pain before and was getting injections by  podiatry. Exam is not concerning for DVT. Wells score low probability. Pa  Dispo: Anticipated discharge today.   Alphonzo Grieve, MD 05/04/2017, 1:32 PM Pager [phone]

## 2017-06-12 ENCOUNTER — Ambulatory Visit: Payer: Medicare Other | Admitting: Cardiology

## 2017-06-13 ENCOUNTER — Encounter: Payer: Self-pay | Admitting: *Deleted

## 2017-07-01 ENCOUNTER — Other Ambulatory Visit: Payer: Self-pay | Admitting: Cardiology

## 2017-08-11 ENCOUNTER — Other Ambulatory Visit: Payer: Self-pay | Admitting: Internal Medicine

## 2017-08-19 ENCOUNTER — Ambulatory Visit: Payer: Medicare Other | Admitting: Cardiology

## 2017-08-19 ENCOUNTER — Telehealth: Payer: Self-pay | Admitting: Cardiology

## 2017-08-19 MED ORDER — METOPROLOL TARTRATE 25 MG PO TABS: 25.0000 mg | ORAL_TABLET | Freq: Two times a day (BID) | ORAL | 0 refills | Status: DC

## 2017-08-19 NOTE — Telephone Encounter (Signed)
New message       *STAT* If patient is at the pharmacy, call can be transferred to refill team.   1. Which medications need to be refilled? (please list name of each medication and dose if known)  Metoprolol 25mg   2. Which pharmacy/location (including street and city if local pharmacy) is medication to be sent to? [facility]/ [address] 3. Do they need a 30 day or 90 day supply?  [place]

## 2017-08-19 NOTE — Telephone Encounter (Signed)
Refill submitted to patient's preferred pharmacy.  

## 2017-08-27 ENCOUNTER — Ambulatory Visit: Payer: Medicare Other | Admitting: Physician Assistant

## 2017-08-27 NOTE — Progress Notes (Deleted)
Cardiology Office Note    Date:  08/27/2017   ID:  Nathaniel [name], DOB [birth year], MRN [id number]  PCP:  Helane Rima, MD  Cardiologist:  Dr. Ellyn Hack   No chief complaint on file.   History of Present Illness:  Nathaniel [name] is a 56 y.o. male with PMH of HLD, TIA, vasovagal syncope, and PSVT. Patient has had extensive workup previously. He had cardiac catheterization on 06/24/2011 which showed essentially normal coronary arteries without obstructive disease, normal LV function. Echocardiogram on 06/14/2014 showed EF 55-60%, grade 1 DD, no significant valve a shoe. Myoview obtained on 07/10/2016 showed normal EF 52%, no significant reversible ischemia or infarction. So far the only possible workup of symptoms to be heart monitor obtained 08/2015 showing sinus tachycardia. He was last admitted in May 2018 with chest pain. He was discharged on the following day after negative serial troponin.  No EKG    Past Medical History:  Diagnosis Date  . Allergy   . Arthritis    right knee  . Asthma    prn inhaler  . Barrett's esophagus 10-25-14 pt denies   states "minor"  . Chest pain    a. 06/2016 MV: EF 52%, no ischemia or infarct.  . 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 Deaf    Call [phone] for patient's sign language interpreter.   Marland Kitchen GERD (gastroesophageal reflux disease)   . Heartburn    occasional, related to certain foods  . High cholesterol   . Intermittent palpitations 10-25-14 patient denies   a. Event Monitor 04/2014: Mostly NSR - intermittent PACs, - 2 brief runs of PAT/PSVT  . PSVT (paroxysmal supraventricular tachycardia) ([place])    a. 04/2014 Event Monitor: brief PAT/PSVT-->uses prn flecainide.  . Seasonal allergies   . Stroke [facility]) 2013   TIA  . TIA (transient ischemic attack) 10-25-14 pt denies   no current deficits  . Vasovagal syncope     Past Surgical History:  Procedure Laterality Date  . Abdominal Aortic Ultrasound  2012   normal abdominal aorta    . BUNIONECTOMY Left 2012   right done also  . CARDIAC CATHETERIZATION  06/24/2011   wwidely patent normal coronary arteries with normal ejection fraction  . Carotid Artery Dopplers  2012   tortuous but no stenoses.. No subclavian disease  . COLONOSCOPY  2015  . COLONOSCOPY WITH PROPOFOL N/A 11/03/2014   Procedure: COLONOSCOPY WITH PROPOFOL;  Surgeon: Milus Banister, MD;  Location: [facility] ENDOSCOPY;  Service: Endoscopy;  Laterality: N/A;  . Foot Implant Removal Right 07/15/2013   @ [place]  . INGUINAL HERNIA REPAIR    . KNEE ARTHROSCOPY Left 06/02/2012  . KNEE ARTHROSCOPY Right fall 2013   x 2  . KNEE ARTHROSCOPY Left 03/03/2013   Procedure: ARTHROSCOPY KNEE WITH CHONDROPLASTY PATELLA  AND LATERAL TIBIAL PLATEAU;  Surgeon: Kerin Salen, MD;  Location: [place];  Service: Orthopedics;  Laterality: Left;  . NM MYOVIEW LTD  2012   small fixed basal inferior artifact. Normal EF.  Marland Kitchen TRANSTHORACIC ECHOCARDIOGRAM  June2015   nnormal EF: 55-60%. No wall motion abnormalities. Gr 1 DD.  Mild MR. Otherwise normal  . Upper and Lower Extremity Arterial Dopplers  2012   normal arterial flow bilateral upper extremities including subclavian is a carotid;;R. ABI 1.2, L. ABI 1.28. Normal flow velocities. Likely calcified vessels.  Marland Kitchen UPPER GASTROINTESTINAL ENDOSCOPY  2013    Current Medications: Outpatient Medications Prior to Visit  Medication Sig Dispense Refill  .  albuterol (PROVENTIL HFA;VENTOLIN HFA) 108 (90 BASE) MCG/ACT inhaler Inhale 1-2 puffs into the lungs every 6 (six) hours as needed for wheezing or shortness of breath. 1 Inhaler 0  . albuterol (PROVENTIL) (2.5 MG/3ML) 0.083% nebulizer solution Take 3 mLs (2.5 mg total) by nebulization every 4 (four) hours as needed for wheezing or shortness of breath. 30 vial 0  . aspirin EC 81 MG EC tablet Take 1 tablet (81 mg total) by mouth daily. 30 tablet 2  . flecainide (TAMBOCOR) 50 MG tablet Take 1 tablet (50 mg total) by mouth 2 (two) times  daily. KEEP OV. 60 tablet 1  . Fluticasone-Salmeterol (ADVAIR) 250-50 MCG/DOSE AEPB Inhale 1 puff into the lungs daily. 60 each 0  . metoprolol tartrate (LOPRESSOR) 25 MG tablet Take 1 tablet (25 mg total) by mouth 2 (two) times daily. Need appointment for refills. 60 tablet 0  . omeprazole (PRILOSEC) 40 MG capsule Take 1 capsule (40 mg total) by mouth daily. Take 30 minutes before first meal of the day. 30 capsule 2   No facility-administered medications prior to visit.      Allergies:   Prednisone; Shellfish-derived products; Shrimp [shellfish allergy]; Simvastatin; Tamsulosin; Fish allergy; and Fish-derived products   Social History   Social History  . Marital status: Married    Spouse name: N/A  . Number of children: 1  . Years of education: college   Occupational History  . disability    Social History Main Topics  . Smoking status: Never Smoker  . Smokeless tobacco: Never Used  . Alcohol use 0.0 oz/week     Comment: occ  . Drug use: No  . Sexual activity: Not on file   Other Topics Concern  . Not on file   Social History Narrative   He is deaf, requiring an interpreter.   He started a new job back in 2013, which was much less stressful for him.  He has subsequently gone on to disability.   He is a married father of one. He previously wrote his bike 2 times a week.   Does not smoke or drink.           Family History:  The patient's ***family history includes Coronary artery disease in his father; Heart attack (age of onset: 104) in his brother; Heart attack (age of onset: 68) in his paternal grandfather; Heart attack (age of onset: 81) in his father; Ovarian cancer in his mother.   ROS:   Please see the history of present illness.    ROS All other systems reviewed and are negative.   PHYSICAL EXAM:   VS:  There were no vitals taken for this visit.   GEN: Well nourished, well developed, in no acute distress  HEENT: normal  Neck: no JVD, carotid bruits, or  masses Cardiac: ***RRR; no murmurs, rubs, or gallops,no edema  Respiratory:  clear to auscultation bilaterally, normal work of breathing GI: soft, nontender, nondistended, + BS MS: no deformity or atrophy  Skin: warm and dry, no rash Neuro:  Alert and Oriented x 3, Strength and sensation are intact Psych: euthymic mood, full affect  Wt Readings from Last 3 Encounters:  11/29/16 172 lb 6.4 oz (78.2 kg)  11/13/16 165 lb (74.8 kg)  08/15/16 172 lb 12.8 oz (78.4 kg)      Studies/Labs Reviewed:   EKG:  EKG is*** ordered today.  The ekg ordered today demonstrates ***  Recent Labs: 11/17/2016: ALT 13 05/03/2017: Magnesium 2.2 05/04/2017: BUN 16; Creatinine, Ser  0.97; Hemoglobin 13.1; Platelets 182; Potassium 3.6; Sodium 139   Lipid Panel    Component Value Date/Time   CHOL 166 07/10/2016 0158   TRIG 180 (H) 07/10/2016 0158   HDL 26 (L) 07/10/2016 0158   CHOLHDL 6.4 07/10/2016 0158   VLDL 36 07/10/2016 0158   LDLCALC 104 (H) 07/10/2016 0158    Additional studies/ records that were reviewed today include:   Cath 06/25/2011 ANGIOGRAPHIC DATA:  The left main coronary artery was angiographically normal, bifurcated into an LAD and left circumflex system.  The LAD gave rise to a large proximal diagonal vessel which was almost a twin-like LAD system.  Both the diagonal and the LAD almost reached the apex.  The distal aspect of these vessels was small.  There was no significant obstructive disease.  The left circumflex coronary artery was angiographically normal, gave rise to 2 proximal small marginal vessels and a larger third marginal vessel.  The right coronary artery was a large-caliber dominant vessel that supplied a large PDA system and small inferior LV and posterolateral branch.  RAO ventriculography revealed normal LV contractility without focal segmental wall motion abnormalities.  Distal aortography did not demonstrate any significant aortoiliac disease.  There  is no evidence for renal artery stenosis.  IMPRESSION: 1. Normal left ventricular function. 2. Essentially normal coronary arteries without obstructive disease.    Echo 06/14/2014 LV EF: 55% -  60%   Study Conclusions  - Left ventricle: The cavity size was normal. Systolic function was normal. The estimated ejection fraction was in the range of 55% to 60%. Wall motion was normal; there were no regional wall motion abnormalities. Doppler parameters are consistent with abnormal left ventricular relaxation (grade 1 diastolic dysfunction). There was no evidence of elevated ventricular filling pressure by Doppler parameters. - Aortic valve: Trileaflet; normal thickness leaflets. There was no regurgitation. - Aortic root: The aortic root was normal in size. - Mitral valve: Structurally normal valve. There was mild regurgitation. - Left atrium: The atrium was mildly dilated. - Right ventricle: Systolic function was normal. - Right atrium: The atrium was normal in size. - Tricuspid valve: There was no regurgitation. - Pulmonic valve: There was trivial regurgitation. - Pulmonary arteries: Systolic pressure was within the normal range. - Inferior vena cava: The vessel was normal in size. The respirophasic diameter changes were in the normal range (= 50%), consistent with normal central venous pressure.  Impressions:  - Normal biventricular size and function. No regional wall motion abnormalities. Impaired relaxation. Normal filling pressures. No significant valvular abnormality.    Event Monitor  08/2015  Notes Recorded by Leonie Man, MD on 11/01/2015 at 2:06 PM [facility] Event Monitor Results.    Sinus rhythm both sinus bradycardia to sinus tachycardia. Minimum heart rate 53 bpm-maximal heart rate 129 bpm.   No documented PVCs or PACs despite significant symptoms   No arrhythmia noted. No PSVT, or A. fib   Only heart racing associated  with sinus tachycardia at 113 bpm correlate symptoms with abnormality.  Overall negative cardiac event monitor that did not show any significant arrhythmias or even PVCs/PACs in a setting of significant patient triggered symptoms. This would suggest that these episodes are not cardiac related.  It would also suggest that his PACs/PVCs and PSVT are well-controlled on current management.   Myoview 07/10/2016 IMPRESSION: 1. No reversible ischemia or infarction.  2. Normal left ventricular wall motion.  3. Left ventricular ejection fraction 52%  4. Low-risk stress test findings*.  ASSESSMENT:    No diagnosis found.   PLAN:  In order of problems listed above:  1. ***    Medication Adjustments/Labs and Tests Ordered: Current medicines are reviewed at length with the patient today.  Concerns regarding medicines are outlined above.  Medication changes, Labs and Tests ordered today are listed in the Patient Instructions below. There are no Patient Instructions on file for this visit.   Hilbert Corrigan, [place]  08/27/2017 7:49 AM    [address], [place], [place]  [address] Phone: [phone]; Fax: [phone]

## 2017-09-24 ENCOUNTER — Encounter: Payer: Self-pay | Admitting: Physician Assistant

## 2017-09-24 ENCOUNTER — Ambulatory Visit (INDEPENDENT_AMBULATORY_CARE_PROVIDER_SITE_OTHER): Payer: Medicare Other | Admitting: Physician Assistant

## 2017-09-24 VITALS — BP 115/78 | HR 66 | Ht 71.0 in | Wt 175.2 lb

## 2017-09-24 DIAGNOSIS — R079 Chest pain, unspecified: Secondary | ICD-10-CM

## 2017-09-24 DIAGNOSIS — R002 Palpitations: Secondary | ICD-10-CM

## 2017-09-24 DIAGNOSIS — R55 Syncope and collapse: Secondary | ICD-10-CM | POA: Diagnosis not present

## 2017-09-24 NOTE — Progress Notes (Signed)
Cardiology Office Note    Date:  09/26/2017   ID:  Nathaniel [name], DOB 21-Nov-1961, MRN [id number]  PCP:  Patient, No Pcp Per  Cardiologist:  Dr. Ellyn Hack  Chief Complaint  Patient presents with  . Follow-up    seen for Dr. Ellyn Hack    History of Present Illness:  Nathaniel [name] is a 56 y.o. 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 He has had multiple admissions for atypical chest pain the past. Cardiac catheterization performed on 06/24/2011 showed essentially normal coronaries. Carotid ultrasound in December 2013 was negative for significant extracranial carotid artery disease. Myoview obtained in July 2017 showed EF 52%, no ischemia or infarction. He was last seen in December 2017, at which time he was doing well. He was seen again in the hospital in May 2018 with chest pain. He was ruled out of ACS by enzyme and discharged to follow-up with cardiology as outpatient. He was also started on Protonix.  Since his last office visit, he was seen in the emergency room on 08/25/2017 with palpitation. He was diagnosed with paroxysmal SVT. There was also some mention of bradycardia as well. According to the patient, he did wear a 24-hour monitor over there, I am unable to see the result. However he says his symptoms still persisted occurring 3-4 times a week. While he was almost 24-hour monitor, he did not have any symptom. He wanted a longer heart monitor which I do agree in this case. Echocardiogram obtained last month showed normal EF with no significant valvular issues. He is concerned his valve. There was no evidence of valvular issue on recent echo, I have reassured him on this. He says he did have another passing out spell just yesterday. He spontaneously woke up later without any symptom. His flecainide was also increased during the most recent ED visit to 100 mg twice a day, this has not helped with the degree or the frequency of the palpitation. At  this point, without knowing what's causing his palpitation, it will be very difficult to pursue further treatment. I recommended a 2 week event monitor to further assess his symptom. We will bring the patient back in 5-6 weeks to review the heart monitor result and potentially consider changing his medication. I did not increase on the beta blocker today as there was some documentation of bradycardia at the outside ED. As far as his chest discomfort, it only occurs during the palpitation episode. He says he feels drained during that episode. Given the clean coronary on previous cardiac catheterization in 2012 and a negative Myoview in 2017, I will hold off on ischemic workup as this time.  Patient was interviewed with help of translator: Nathaniel [name] [id number]   Past Medical History:  Diagnosis Date  . Allergy   . Arthritis    right knee  . Asthma    prn inhaler  . Barrett's esophagus 10-25-14 pt denies   states "minor"  . Chest pain    a. 06/2016 MV: EF 52%, no ischemia or infarct.  . 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 Deaf    Call [phone] for patient's sign language interpreter.   Marland Kitchen GERD (gastroesophageal reflux disease)   . Heartburn    occasional, related to certain foods  . High cholesterol   . Intermittent palpitations 10-25-14 patient denies   a. Event Monitor 04/2014: Mostly NSR - intermittent PACs, - 2 brief runs of PAT/PSVT  . PSVT (  paroxysmal supraventricular tachycardia) ([place])    a. 04/2014 Event Monitor: brief PAT/PSVT-->uses prn flecainide.  . Seasonal allergies   . Stroke [facility]) 2013   TIA  . TIA (transient ischemic attack) 10-25-14 pt denies   no current deficits  . Vasovagal syncope     Past Surgical History:  Procedure Laterality Date  . Abdominal Aortic Ultrasound  2012   normal abdominal aorta  . BUNIONECTOMY Left 2012   right done also  . CARDIAC CATHETERIZATION  06/24/2011   wwidely patent normal coronary arteries with normal ejection fraction  .  Carotid Artery Dopplers  2012   tortuous but no stenoses.. No subclavian disease  . COLONOSCOPY  2015  . COLONOSCOPY WITH PROPOFOL N/A 11/03/2014   Procedure: COLONOSCOPY WITH PROPOFOL;  Surgeon: Milus Banister, MD;  Location: [facility];  Service: Endoscopy;  Laterality: N/A;  . Foot Implant Removal Right 07/15/2013   @ [facility]  . INGUINAL HERNIA REPAIR    . KNEE ARTHROSCOPY Left 06/02/2012  . KNEE ARTHROSCOPY Right fall 2013   x 2  . KNEE ARTHROSCOPY Left 03/03/2013   Procedure: ARTHROSCOPY KNEE WITH CHONDROPLASTY PATELLA  AND LATERAL TIBIAL PLATEAU;  Surgeon: Kerin Salen, MD;  Location: [facility];  Service: Orthopedics;  Laterality: Left;  . NM MYOVIEW LTD  2012   small fixed basal inferior artifact. Normal EF.  Marland Kitchen TRANSTHORACIC ECHOCARDIOGRAM  June2015   nnormal EF: 55-60%. No wall motion abnormalities. Gr 1 DD.  Mild MR. Otherwise normal  . Upper and Lower Extremity Arterial Dopplers  2012   normal arterial flow bilateral upper extremities including subclavian is a carotid;;R. ABI 1.2, L. ABI 1.28. Normal flow velocities. Likely calcified vessels.  Marland Kitchen UPPER GASTROINTESTINAL ENDOSCOPY  2013    Current Medications: Outpatient Medications Prior to Visit  Medication Sig Dispense Refill  . albuterol (PROVENTIL HFA;VENTOLIN HFA) 108 (90 BASE) MCG/ACT inhaler Inhale 1-2 puffs into the lungs every 6 (six) hours as needed for wheezing or shortness of breath. 1 Inhaler 0  . albuterol (PROVENTIL) (2.5 MG/3ML) 0.083% nebulizer solution Take 3 mLs (2.5 mg total) by nebulization every 4 (four) hours as needed for wheezing or shortness of breath. 30 vial 0  . aspirin EC 81 MG EC tablet Take 1 tablet (81 mg total) by mouth daily. 30 tablet 2  . Fluticasone-Salmeterol (ADVAIR) 250-50 MCG/DOSE AEPB Inhale 1 puff into the lungs daily. 60 each 0  . metoprolol tartrate (LOPRESSOR) 25 MG tablet Take 1 tablet (25 mg total) by mouth 2 (two) times daily. Need appointment for refills. 60 tablet 0    . omeprazole (PRILOSEC) 40 MG capsule Take 1 capsule (40 mg total) by mouth daily. Take 30 minutes before first meal of the day. 30 capsule 2  . flecainide (TAMBOCOR) 50 MG tablet Take 1 tablet (50 mg total) by mouth 2 (two) times daily. KEEP OV. 60 tablet 1   No facility-administered medications prior to visit.      Allergies:   Prednisone; Shellfish-derived products; Shrimp [shellfish allergy]; Simvastatin; Tamsulosin; Simvastatin - high dose  [simvastatin-high dose]; Fish allergy; and Fish-derived products   Social History   Social History  . Marital status: Married    Spouse name: N/A  . Number of children: 1  . Years of education: college   Occupational History  . disability    Social History Main Topics  . Smoking status: Never Smoker  . Smokeless tobacco: Never Used  . Alcohol use 0.0 oz/week     Comment:  occ  . Drug use: No  . Sexual activity: Not Asked   Other Topics Concern  . None   Social History Narrative   He is deaf, requiring an interpreter.   He started a new job back in 2013, which was much less stressful for him.  He has subsequently gone on to disability.   He is a married father of one. He previously wrote his bike 2 times a week.   Does not smoke or drink.           Family History:  The patient's family history includes Coronary artery disease in his father; Heart attack (age of onset: 51) in his brother; Heart attack (age of onset: 70) in his paternal grandfather; Heart attack (age of onset: 5) in his father; Ovarian cancer in his mother.   ROS:   Please see the history of present illness.    ROS All other systems reviewed and are negative.   PHYSICAL EXAM:   VS:  BP 115/78   Pulse 66   Ht 5\' 11"  (1.803 m)   Wt 175 lb 3.2 oz (79.5 kg)   BMI 24.44 kg/m    GEN: Well nourished, well developed, in no acute distress  HEENT: normal  Neck: no JVD, carotid bruits, or masses Cardiac: RRR; no murmurs, rubs, or gallops,no edema  Respiratory:   clear to auscultation bilaterally, normal work of breathing GI: soft, nontender, nondistended, + BS MS: no deformity or atrophy  Skin: warm and dry, no rash Neuro:  Alert and Oriented x 3, Strength and sensation are intact Psych: euthymic mood, full affect  Wt Readings from Last 3 Encounters:  09/24/17 175 lb 3.2 oz (79.5 kg)  11/29/16 172 lb 6.4 oz (78.2 kg)  11/13/16 165 lb (74.8 kg)      Studies/Labs Reviewed:   EKG:  EKG is not ordered today.    Recent Labs: 11/17/2016: ALT 13 05/03/2017: Magnesium 2.2 05/04/2017: BUN 16; Creatinine, Ser 0.97; Hemoglobin 13.1; Platelets 182; Potassium 3.6; Sodium 139   Lipid Panel    Component Value Date/Time   CHOL 166 07/10/2016 0158   TRIG 180 (H) 07/10/2016 0158   HDL 26 (L) 07/10/2016 0158   CHOLHDL 6.4 07/10/2016 0158   VLDL 36 07/10/2016 0158   LDLCALC 104 (H) 07/10/2016 0158    Additional studies/ records that were reviewed today include:   Cath 06/24/2011 ANGIOGRAPHIC DATA:  The left main coronary artery was angiographically normal, bifurcated into an LAD and left circumflex system.  The LAD gave rise to a large proximal diagonal vessel which was almost a twin-like LAD system.  Both the diagonal and the LAD almost reached the apex.  The distal aspect of these vessels was small.  There was no significant obstructive disease.  The left circumflex coronary artery was angiographically normal, gave rise to 2 proximal small marginal vessels and a larger third marginal vessel.  The right coronary artery was a large-caliber dominant vessel that supplied a large PDA system and small inferior LV and posterolateral branch.  RAO ventriculography revealed normal LV contractility without focal segmental wall motion abnormalities.  Distal aortography did not demonstrate any significant aortoiliac disease.  There is no evidence for renal artery stenosis.  IMPRESSION: 1. Normal left ventricular function. 2. Essentially normal  coronary arteries without obstructive disease.   Carotid US 12/21/2012 Summary: No significant extracranial carotid artery stenosis demonstrated. Vertebrals are patent with antegrade flow.  Myoview 07/10/2016 IMPRESSION: 1. No reversible ischemia or infarction.  2. Normal  left ventricular wall motion.  3. Left ventricular ejection fraction 52%  4. Low-risk stress test findings*.   ASSESSMENT:    1. Palpitations   2. Syncope, unspecified syncope type   3. Chest pain, unspecified type      PLAN:  In order of problems listed above:  1. Palpitation: Continue to have palpitation associated with chest discomfort, he did have a recent 24-hour Holter monitor which reportedly was negative. I wish to obtain a longer monitor. We will arrange for 2 week event monitor. He was hesitant to do it for 30 days due to skin reaction to the patches.  2. Syncope: History of vasovagal syncope, unclear if there is any arrhythmia to explain his symptoms. We will assess using the event monitor. Given reports of bradycardia at outside ED, I am hesitant to increase his beta blocker.  3. Chest pain: Occurs during palpitation, history of clean coronary on cath in 2012 and a negative stress test in 2017, we'll hold off on ischemic workup.    Medication Adjustments/Labs and Tests Ordered: Current medicines are reviewed at length with the patient today.  Concerns regarding medicines are outlined above.  Medication changes, Labs and Tests ordered today are listed in the Patient Instructions below. Patient Instructions  Medication Instructions: Your physician recommends that you continue on your current medications as directed. Please refer to the Current Medication list given to you today.   Testing/Procedures: Your physician has recommended that you wear a 14 day event monitor. Event monitors are medical devices that record the heart's electrical activity. Doctors most often [place] these monitors to  diagnose arrhythmias. Arrhythmias are problems with the speed or rhythm of the heartbeat. The monitor is a small, portable device. You can wear one while you do your normal daily activities. This is usually used to diagnose what is causing palpitations/syncope (passing out). This will be placed at [address]  Follow-Up: Your physician recommends that you schedule a follow-up appointment in: 5-6 weeks with either Almyra Deforest, PA (when Dr. Ellyn Hack in office) or with Dr. Ellyn Hack.  If you need a refill on your cardiac medications before your next appointment, please call your pharmacy.     Hilbert Corrigan, [place]  09/26/2017 8:42 AM    [address], [place], [place]  [address] Phone: [phone]; Fax: [phone]

## 2017-09-24 NOTE — Patient Instructions (Signed)
Medication Instructions: Your physician recommends that you continue on your current medications as directed. Please refer to the Current Medication list given to you today.   Testing/Procedures: Your physician has recommended that you wear a 14 day event monitor. Event monitors are medical devices that record the heart's electrical activity. Doctors most often [place] these monitors to diagnose arrhythmias. Arrhythmias are problems with the speed or rhythm of the heartbeat. The monitor is a small, portable device. You can wear one while you do your normal daily activities. This is usually used to diagnose what is causing palpitations/syncope (passing out). This will be placed at [address]  Follow-Up: Your physician recommends that you schedule a follow-up appointment in: 5-6 weeks with either Almyra Deforest, PA (when Dr. Ellyn Hack in office) or with Dr. Ellyn Hack.  If you need a refill on your cardiac medications before your next appointment, please call your pharmacy.

## 2017-09-26 ENCOUNTER — Encounter: Payer: Self-pay | Admitting: Physician Assistant

## 2017-10-01 ENCOUNTER — Other Ambulatory Visit: Payer: Self-pay | Admitting: Internal Medicine

## 2017-10-08 ENCOUNTER — Ambulatory Visit (INDEPENDENT_AMBULATORY_CARE_PROVIDER_SITE_OTHER): Payer: Medicare Other

## 2017-10-08 ENCOUNTER — Other Ambulatory Visit: Payer: Self-pay | Admitting: Cardiology

## 2017-10-08 DIAGNOSIS — R002 Palpitations: Secondary | ICD-10-CM

## 2017-10-08 DIAGNOSIS — R55 Syncope and collapse: Secondary | ICD-10-CM

## 2017-10-31 ENCOUNTER — Encounter: Payer: Self-pay | Admitting: Cardiology

## 2017-10-31 ENCOUNTER — Ambulatory Visit (INDEPENDENT_AMBULATORY_CARE_PROVIDER_SITE_OTHER): Payer: Medicare Other | Admitting: Cardiology

## 2017-10-31 VITALS — BP 134/83 | HR 94 | Ht 71.0 in | Wt 177.4 lb

## 2017-10-31 DIAGNOSIS — I493 Ventricular premature depolarization: Secondary | ICD-10-CM

## 2017-10-31 DIAGNOSIS — R55 Syncope and collapse: Secondary | ICD-10-CM | POA: Diagnosis not present

## 2017-10-31 DIAGNOSIS — I471 Supraventricular tachycardia: Secondary | ICD-10-CM | POA: Diagnosis not present

## 2017-10-31 MED ORDER — FLECAINIDE ACETATE 100 MG PO TABS: 100.0000 mg | ORAL_TABLET | Freq: Two times a day (BID) | ORAL | 3 refills | Status: DC

## 2017-10-31 MED ORDER — METOPROLOL TARTRATE 50 MG PO TABS: 50.0000 mg | ORAL_TABLET | Freq: Two times a day (BID) | ORAL | 3 refills | Status: DC

## 2017-10-31 NOTE — Progress Notes (Signed)
PCP: Patient, No Pcp Per  Clinic Note: Chief Complaint  Patient presents with  . Follow-up    palpitations  . Tachycardia    h/o of brief episodes of SVT on moniotor 2016    HPI: Nathaniel [name] is a 56 y.o. male with a PMH below who presents today for annual follow-up (actually 1 month follow-up) with a history of SVT and chronic intermittent chest pain evaluated with multiple different modalities.  He has had intermittent episodes of passing out.  He was started on flecainide in 2017.  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  Nathaniel [name] was last seen on November 29, 2016 -he was very concerned about having to recurrent episodes of tachycardia.  He is very upset and concerned.  He felt very stressed and pressured by his family to do things.  He thought he was having a heart attack.  He was seen by Almyra Deforest, PA back in September - after hospital  --unable to wear monitor due to reaction to pads  Recent Hospitalizations:   Admitted for chest pain May 2018 [facility]) -ruled out for MI.  Thought to be potentially related to GERD, started on Protonix  Pneumonia in August 2018 -> noted to have SVT and flecainide was increased to 100 mg twice daily.  August 29, 2017 ER visit ([facility]) -> rapid heartbeat palpitations and took extra flecainide with no benefit.  Went to the emergency room for a second event in 2 days.  Cardiology recommended increasing the flecainide to 100 mg twice daily and continue current dose of beta-blocker.  Was discharged in the ER.  Studies Personally Reviewed - (if available, images/films reviewed: From Epic Chart or Care Everywhere)  He was supposed to have had a monitor in September throughout October -- did not wear b/c Rxn to adhesive gel.  2D Echo August 2018 ([place]): Normal LV size and function.  EF 60-65%.  No significant valvular  abnormalities.  Chest CT Jan 2018 [facility]) - No pulmonary infiltrate, mass, or pleural effusion.  No endobronchial findings.  Interval History: Nathaniel [name] presents today for what should be his follow-up visit, but is here to discuss his recent hospitalization and fears of his rapid heartbeat spells.  He says he is still having episodes since his hospital stay.  He has not had any, but does feel lightheaded and dizzy.  It is more that he gets frightened from the chest pain and his family gets him all riled up.  Unfortunately, he never actually is having an episode when he gets to the emergency room.  I actually cannot tell if he is taking his flecainide now or not.  It is not currently listed, however my understanding from him is that he is taking it.  He actually says that at least one time he thinks he lost consciousness, however this was not noted in the review of his outpatient chart.  At present, he is denying any chest tightness or pressure at rest or exertion.  He only notes the chest discomfort when he is having palpitations. No PND, orthopnea or edema. No TIA/amaurosis fugax symptoms. No melena, hematochezia, hematuria, or epstaxis. No claudication.  ROS: A comprehensive was performed. Review of Systems  HENT: Positive for hearing loss (Chronically deaf). Negative for congestion and nosebleeds.   Respiratory: Negative.   Gastrointestinal: Negative for blood in stool, constipation and melena.  Genitourinary:  Negative for dysuria, hematuria and urgency.  Musculoskeletal: Negative.   Skin:       Significant reaction to the monitor lead pads  Neurological: Positive for dizziness (With rapid heartbeat spells).  Psychiatric/Behavioral: The patient is nervous/anxious.   All other systems reviewed and are negative.   I have reviewed and (if needed) personally updated the patient's problem list, medications, allergies, past medical and surgical history, social and family history.    Past Medical History:  Diagnosis Date  . Allergy   . Arthritis    right knee  . Asthma    prn inhaler  . Barrett's esophagus 10-25-14 pt denies   states "minor"  . Chest pain    a. 06/2016 MV: EF 52%, no ischemia or infarct.  . 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 Deaf    Call [phone] for patient's sign language interpreter.   Marland Kitchen GERD (gastroesophageal reflux disease)   . Heartburn    occasional, related to certain foods  . High cholesterol   . Intermittent palpitations 10-25-14 patient denies   a. Event Monitor 04/2014: Mostly NSR - intermittent PACs, - 2 brief runs of PAT/PSVT  . PSVT (paroxysmal supraventricular tachycardia) ([place])    a. 04/2014 Event Monitor: brief PAT/PSVT-->uses prn flecainide.  . Seasonal allergies   . Stroke [facility]) 2013   TIA  . TIA (transient ischemic attack) 10-25-14 pt denies   no current deficits  . Vasovagal syncope     Past Surgical History:  Procedure Laterality Date  . [place]  08/2015   Ranging from mostly sinus rhythm to occasional sinus bradycardia and sinus tachycardia. Heart rate from 53 bpm to 129 bpm. No documented PVCs or PACs noted. No arrhythmia noted. No PSVT or A. Fib. "Heart racing" noted with sinus tachycardia.  . Abdominal Aortic Ultrasound  2012   normal abdominal aorta  . BUNIONECTOMY Left 2012   right done also  . CARDIAC CATHETERIZATION  06/24/2011   wwidely patent normal coronary arteries with normal ejection fraction  . Carotid Artery Dopplers  2012   tortuous but no stenoses.. No subclavian disease  . COLONOSCOPY  2015  . Foot Implant Removal Right 07/15/2013   @ [place]  . INGUINAL HERNIA REPAIR    . KNEE ARTHROSCOPY Left 06/02/2012  . KNEE ARTHROSCOPY Right fall 2013   x 2  . NM MYOVIEW LTD  06/2016   No evidence of ischemia or infarction.   Nathaniel [name] ECHOCARDIOGRAM  June 2015, August 2018   A) normal EF: 55-60%. No wall motion abnormalities. Gr 1 DD.  Mild MR. Otherwise normal;; b)  ([facility]) - Normal LV size and function.  EF 60-65%.  No significant valvular abnormalities.  Marland Kitchen Upper and Lower Extremity Arterial Dopplers  2012   normal arterial flow bilateral upper extremities including subclavian is a carotid;;R. ABI 1.2, L. ABI 1.28. Normal flow velocities. Likely calcified vessels.  Marland Kitchen UPPER GASTROINTESTINAL ENDOSCOPY  2013    Current Meds  Medication Sig  . albuterol (PROVENTIL HFA;VENTOLIN HFA) 108 (90 BASE) MCG/ACT inhaler Inhale 1-2 puffs into the lungs every 6 (six) hours as needed for wheezing or shortness of breath.  Marland Kitchen albuterol (PROVENTIL) (2.5 MG/3ML) 0.083% nebulizer solution Take 3 mLs (2.5 mg total) by nebulization every 4 (four) hours as needed for wheezing or shortness of breath.  Marland Kitchen aspirin EC 81 MG EC tablet Take 1 tablet (81 mg total) by mouth daily.  . Fluticasone-Salmeterol (ADVAIR) 250-50 MCG/DOSE AEPB Inhale 1 puff into the  lungs daily.  . metoprolol tartrate (LOPRESSOR) 50 MG tablet Take 1 tablet (50 mg total) by mouth 2 (two) times daily. Need appointment for refills.  Marland Kitchen omeprazole (PRILOSEC) 40 MG capsule Take 1 capsule (40 mg total) by mouth daily. Take 30 minutes before first meal of the day.  . [DISCONTINUED] metoprolol tartrate (LOPRESSOR) 25 MG tablet Take 1 tablet (25 mg total) by mouth 2 (two) times daily. Need appointment for refills.    Allergies  Allergen Reactions  . Prednisone Palpitations  . Shellfish-Derived Products Itching and Rash  . Shrimp [Shellfish Allergy] Rash and Itching  . Simvastatin Other (See Comments)    DEPRESSION, VISUAL DISTURBANCE(FLASHING LIGHTS)  . Tamsulosin Other (See Comments)    Other reaction(s): Confusion Other reaction(s): Other (See Comments) Changes in behavior  Changes in behavior  Changes in behavior   . Adhesive [Tape]   . Simvastatin - High Dose  [Simvastatin-High Dose]     Depression, behavioral issues  . Fish Allergy Rash and Other (See Comments)    SOFTSHELL FISH  . Fish-Derived Products  Rash and Other (See Comments)    SOFTSHELL FISH SOFTSHELL [place]    Social History   Socioeconomic History  . Marital status: Married    Spouse name: None  . Number of children: 1  . Years of education: college  . Highest education level: None  Social Needs  . Financial resource strain: None  . Food insecurity - worry: None  . Food insecurity - inability: None  . Transportation needs - medical: None  . Transportation needs - non-medical: None  Occupational History  . Occupation: disability  Tobacco Use  . Smoking status: Never Smoker  . Smokeless tobacco: Never Used  Substance and Sexual Activity  . Alcohol use: Yes    Alcohol/week: 0.0 oz    Comment: occ  . Drug use: No  . Sexual activity: None  Other Topics Concern  . None  Social History Narrative   He is deaf, requiring an interpreter.   He started a new job back in 2013, which was much less stressful for him.  He has subsequently gone on to disability.   He is a married father of one. He previously wrote his bike 2 times a week.   Does not smoke or drink.          family history includes Coronary artery disease in his father; Heart attack (age of onset: 24) in his brother; Heart attack (age of onset: 59) in his paternal grandfather; Heart attack (age of onset: 46) in his father; Ovarian cancer in his mother.  Wt Readings from Last 3 Encounters:  10/31/17 177 lb 6.4 oz (80.5 kg)  09/24/17 175 lb 3.2 oz (79.5 kg)  11/29/16 172 lb 6.4 oz (78.2 kg)    PHYSICAL EXAM BP 134/83   Pulse 94   Ht 5\' 11"  (1.803 m)   Wt 177 lb 6.4 oz (80.5 kg)   SpO2 96%   BMI 24.74 kg/m  Physical Exam  Constitutional: He is oriented to person, place, and time. He appears well-developed and well-nourished. No distress.  HENT:  Head: Normocephalic and atraumatic.  Mouth/Throat: No oropharyngeal exudate.  He is deaf.  Communication is via sign language interpreter  Neck: No hepatojugular reflux and no JVD present. Carotid bruit is  not present.  Cardiovascular: Normal rate, regular rhythm, normal heart sounds and intact distal pulses.  No extrasystoles are present. PMI is not displaced. Exam reveals no gallop and no friction rub.  No murmur heard. Pulmonary/Chest: Effort normal and breath sounds normal. No respiratory distress. He has no wheezes. He has no rales.  Abdominal: Soft. Bowel sounds are normal. He exhibits no distension. There is no tenderness. There is no rebound.  Musculoskeletal: Normal range of motion. He exhibits no edema.  Neurological: He is alert and oriented to person, place, and time.  Skin: Skin is warm and dry. No rash noted. No erythema. No pallor.  Psychiatric: He has a normal mood and affect. His behavior is normal. Judgment and thought content normal.  As usual, easily excitable.  Perseverates on the same symptoms over and over again  Nursing note and vitals reviewed.   Adult ECG Report None  Other studies Reviewed: Additional studies/ records that were reviewed today include:  Recent Labs:  n/a  Lab Results  Component Value Date   CREATININE 0.97 05/04/2017   BUN 16 05/04/2017   NA 139 05/04/2017   K 3.6 05/04/2017   CL 107 05/04/2017   CO2 25 05/04/2017    ASSESSMENT / PLAN:  Nathaniel [name] is a very difficult patient to evaluate and treat, partly because of the barrier using sign language and delay in exhalation.  He is just profoundly symptomatic with what sounds like a possible arrhythmia.  Beyond beta-blockers and flecainide, I would prefer to have electrophysiology evaluation. Mostly because of the communication barrier and the need to explain things.  The, his visits are always prolonged.  I spent close to 55-60 minutes with the patient today.  Greater than 50% of the time was spent simply trying to ask questions and then explain recommendations/plan.  Problem List Items Addressed This Visit    PVC (premature ventricular contraction) (Chronic)    He has had episodes of PACs and  PVCs on monitor and these are usually controlled with beta-blocker.  He may be feeling episodes of bigeminy or trigeminy as opposed to SVT.  Given the extent of symptoms, I am referring to electrophysiology      Relevant Medications   flecainide (TAMBOCOR) 100 MG tablet   metoprolol tartrate (LOPRESSOR) 50 MG tablet   SVT (supraventricular tachycardia) (HCC) (Chronic)    Very difficult to manage.  He has episodes lasting not very long that caused him to be very fearful and concerned.  He goes to the emergency room quite frequently.  He has frequent episodes every 3-4 months or so.  Has not had any true syncope related to these episodes however. Plan: Increase metoprolol to 50 twice daily for now. Would use flecainide as as needed only 100 mg twice daily. We discussed vagal maneuvers.  Because he is having such recurrent symptomatic episodes, I am referring to electrophysiology for consideration of loop recorder and possible SVT ablation if indicated.  Would also ask for additional recognitions to be on flecainide and beta-blocker.      Relevant Medications   flecainide (TAMBOCOR) 100 MG tablet   metoprolol tartrate (LOPRESSOR) 50 MG tablet   Syncope - Primary    He has had episodes of vasovagal ectopy, but I think it may be that he is currently noticing is his palpitations.  He may have had syncope if he had a prolonged SVT.  Plan: EP referral.      Relevant Medications   flecainide (TAMBOCOR) 100 MG tablet   metoprolol tartrate (LOPRESSOR) 50 MG tablet      Current medicines are reviewed at length with the patient today. (+/- concerns) ? What does he do with Flecainide The following changes  have been made: continue with using 100 mg BID PRN. Increase Beta Blocker - EP consult  Patient Instructions  Medication Instructions:  INCREASE Metoprolol 50 mg take 1 tablet twice a day    Labwork: None   Testing/Procedures: None   Follow-Up: Your physician recommends that you  schedule a follow-up appointment in: EP with Dr Lovena Le or Dr Curt Bears first available  And then Dr Ellyn Hack after EP visit   Any Other Special Instructions Will Be Listed Below (If Applicable).  If you need a refill on your cardiac medications before your next appointment, please call your pharmacy.    Studies Ordered:   No orders of the defined types were placed in this encounter.     Glenetta Hew, M.D., M.S. Interventional Cardiologist   Pager # [phone] Phone # [phone] [address] [place], [place] [address]

## 2017-10-31 NOTE — Patient Instructions (Signed)
Medication Instructions:  INCREASE Metoprolol 50 mg take 1 tablet twice a day    Labwork: None   Testing/Procedures: None   Follow-Up: Your physician recommends that you schedule a follow-up appointment in: EP with Dr Lovena Le or Dr Curt Bears first available  And then Dr Ellyn Hack after EP visit   Any Other Special Instructions Will Be Listed Below (If Applicable).  If you need a refill on your cardiac medications before your next appointment, please call your pharmacy.

## 2017-11-02 ENCOUNTER — Encounter: Payer: Self-pay | Admitting: Cardiology

## 2017-11-02 NOTE — Assessment & Plan Note (Signed)
He has had episodes of vasovagal ectopy, but I think it may be that he is currently noticing is his palpitations.  He may have had syncope if he had a prolonged SVT.  Plan: EP referral.

## 2017-11-02 NOTE — Assessment & Plan Note (Signed)
He has had episodes of PACs and PVCs on monitor and these are usually controlled with beta-blocker.  He may be feeling episodes of bigeminy or trigeminy as opposed to SVT.  Given the extent of symptoms, I am referring to electrophysiology

## 2017-11-02 NOTE — Assessment & Plan Note (Signed)
Very difficult to manage.  He has episodes lasting not very long that caused him to be very fearful and concerned.  He goes to the emergency room quite frequently.  He has frequent episodes every 3-4 months or so.  Has not had any true syncope related to these episodes however. Plan: Increase metoprolol to 50 twice daily for now. Would use flecainide as as needed only 100 mg twice daily. We discussed vagal maneuvers.  Because he is having such recurrent symptomatic episodes, I am referring to electrophysiology for consideration of loop recorder and possible SVT ablation if indicated.  Would also ask for additional recognitions to be on flecainide and beta-blocker.

## 2017-11-14 ENCOUNTER — Telehealth: Payer: Self-pay | Admitting: Cardiology

## 2017-11-14 ENCOUNTER — Telehealth (INDEPENDENT_AMBULATORY_CARE_PROVIDER_SITE_OTHER): Payer: Medicare Other | Admitting: *Deleted

## 2017-11-14 DIAGNOSIS — R55 Syncope and collapse: Secondary | ICD-10-CM

## 2017-11-14 DIAGNOSIS — R002 Palpitations: Secondary | ICD-10-CM

## 2017-11-14 DIAGNOSIS — I493 Ventricular premature depolarization: Secondary | ICD-10-CM

## 2017-11-14 DIAGNOSIS — I471 Supraventricular tachycardia: Secondary | ICD-10-CM

## 2017-11-14 MED ORDER — FLECAINIDE ACETATE 100 MG PO TABS: 100.0000 mg | ORAL_TABLET | Freq: Two times a day (BID) | ORAL | 3 refills | Status: DC

## 2017-11-14 NOTE — Telephone Encounter (Signed)
Refill sent to requested pharmacy.

## 2017-11-14 NOTE — Telephone Encounter (Signed)
The patient came in as a walk in. He was inquiring about a refill on Flecainide. He stated that he had been out of it for four days. The refill has been sent in and a call was placed to the pharmacy to confirm.  While here the patient complained of being dizzy. He denied chest pain and shortness of breath. He was wheeled to a private room and blood pressure  and EKG was done. Blood pressure was 135/89 and heart rate was 108. The EKG was read by Dr. Ellyn Hack. Per Dr. Ellyn Hack the EKG was normal. If he still had complaints of being dizzy he should go to the ED to be evaluated.   The patient stated that he felt better and was not dizzy anymore. Heart rate was in the 80's. He refused to go the the ED but stated that if he felt worse then he would call 911 and go.

## 2017-11-24 ENCOUNTER — Encounter: Payer: Self-pay | Admitting: Cardiology

## 2017-11-24 ENCOUNTER — Ambulatory Visit (INDEPENDENT_AMBULATORY_CARE_PROVIDER_SITE_OTHER): Payer: Medicare Other | Admitting: Cardiology

## 2017-11-24 VITALS — BP 112/74 | HR 68 | Ht 71.0 in | Wt 180.0 lb

## 2017-11-24 DIAGNOSIS — I471 Supraventricular tachycardia: Secondary | ICD-10-CM | POA: Diagnosis not present

## 2017-11-24 DIAGNOSIS — I493 Ventricular premature depolarization: Secondary | ICD-10-CM | POA: Diagnosis not present

## 2017-11-24 MED ORDER — DILTIAZEM HCL ER COATED BEADS 240 MG PO CP24: 240.0000 mg | ORAL_CAPSULE | Freq: Every day | ORAL | 3 refills | Status: DC

## 2017-11-24 NOTE — Patient Instructions (Signed)
Medication Instructions:  Your physician has recommended you make the following change in your medication:  1) STOP Metoprolol tartrate 2) START Diltiazem 240 mg once daily  * If you need a refill on your cardiac medications before your next appointment, please call your pharmacy. *  Labwork: None ordered  Testing/Procedures: None ordered  Follow-Up: Your physician recommends that you schedule a follow-up appointment in: 3 months with Dr. Curt Bears.  Thank you for choosing [facility]!!   Trinidad Curet, RN [phone]  Any Other Special Instructions Will Be Listed Below (If Applicable).  Diltiazem extended-release capsules or tablets What is this medicine? DILTIAZEM (dil TYE a zem) is a calcium-channel blocker. It affects the amount of calcium found in your heart and muscle cells. This relaxes your blood vessels, which can reduce the amount of work the heart has to do. This medicine is used to treat high blood pressure and chest pain caused by angina. This medicine may be used for other purposes; ask your health care provider or pharmacist if you have questions. COMMON BRAND NAME(S): Cardizem CD, Cardizem LA, Cardizem SR, Cartia XT, Dilacor XR, Dilt-CD, Diltia XT, Diltzac, Matzim LA, Rema Fendt, Tiamate, Tiazac What should I tell my health care provider before I take this medicine? They need to know if you have any of these conditions: -heart problems, low blood pressure, irregular heartbeat -liver disease -previous heart attack -an unusual or allergic reaction to diltiazem, other medicines, foods, dyes, or preservatives -pregnant or trying to get pregnant -breast-feeding How should I use this medicine? Take this medicine by mouth with a glass of water. Follow the directions on the prescription label. Swallow whole, do not crush or chew. Ask your doctor or pharmacist if your should take this medicine with food. Take your doses at regular intervals. Do not take your medicine  more often then directed. Do not stop taking except on the advice of your doctor or health care professional. Ask your doctor or health care professional how to gradually reduce the dose. Talk to your pediatrician regarding the use of this medicine in children. Special care may be needed. Overdosage: If you think you have taken too much of this medicine contact a poison control center or emergency room at once. NOTE: This medicine is only for you. Do not share this medicine with others. What if I miss a dose? If you miss a dose, take it as soon as you can. If it is almost time for your next dose, take only that dose. Do not take double or extra doses. What may interact with this medicine? Do not take this medicine with any of the following medications: -cisapride -hawthorn -pimozide -ranolazine -red yeast rice This medicine may also interact with the following medications: -buspirone -carbamazepine -cimetidine -cyclosporine -digoxin -local anesthetics or general anesthetics -lovastatin -medicines for anxiety or difficulty sleeping like midazolam and triazolam -medicines for high blood pressure or heart problems -quinidine -rifampin, rifabutin, or rifapentine This list may not describe all possible interactions. Give your health care provider a list of all the medicines, herbs, non-prescription drugs, or dietary supplements you use. Also tell them if you smoke, drink alcohol, or use illegal drugs. Some items may interact with your medicine. What should I watch for while using this medicine? Check your blood pressure and pulse rate regularly. Ask your doctor or health care professional what your blood pressure and pulse rate should be and when you should contact him or her. You may feel dizzy or lightheaded. Do not drive,  use machinery, or do anything that needs mental alertness until you know how this medicine affects you. To reduce the risk of dizzy or fainting spells, do not sit or stand  up quickly, especially if you are an older patient. Alcohol can make you more dizzy or increase flushing and rapid heartbeats. Avoid alcoholic drinks. What side effects may I notice from receiving this medicine? Side effects that you should report to your doctor or health care professional as soon as possible: -allergic reactions like skin rash, itching or hives, swelling of the face, lips, or tongue -confusion, mental depression -feeling faint or lightheaded, falls -redness, blistering, peeling or loosening of the skin, including inside the mouth -slow, irregular heartbeat -swelling of the feet and ankles -unusual bleeding or bruising, pinpoint red spots on the skin Side effects that usually do not require medical attention (report to your doctor or health care professional if they continue or are bothersome): -constipation or diarrhea -difficulty sleeping -facial flushing -headache -nausea, vomiting -sexual dysfunction -weak or tired This list may not describe all possible side effects. Call your doctor for medical advice about side effects. You may report side effects to FDA at 1-800-FDA-1088. Where should I keep my medicine? Keep out of the reach of children. Store at room temperature between 15 and 30 degrees C (59 and 86 degrees F). Protect from humidity. Throw away any unused medicine after the expiration date. NOTE: This sheet is a summary. It may not cover all possible information. If you have questions about this medicine, talk to your doctor, pharmacist, or health care provider.  2018 Elsevier/Gold Standard (2008-04-07 14:35:47)

## 2017-11-24 NOTE — Progress Notes (Signed)
Electrophysiology Office Note   Date:  11/24/2017   ID:  Nathaniel [name], DOB February 11, 1961, MRN [id number]  PCP:  Patient, No Pcp Per  Cardiologist:  Ellyn Hack Primary Electrophysiologist:  Makaylee Spielberg Meredith Leeds, MD    Chief Complaint  Patient presents with  . Advice Only    SVT/PVC's     History of Present Illness: Nathaniel [name] is a 56 y.o. male who is being seen today for the evaluation of syncope at the request of No ref. provider found. Presenting today for electrophysiology evaluation.  He has a history of hyperlipidemia, PSVT on as needed flecainide, CVA.  He is also deaf and uses a sign language interpreter.  He was started on flecainide in 2017.  He was admitted to the hospital in August 2018 and was noted to have SVT.  Flecainide was increased to 100 mg twice a day at that time.  He presented to the emergency room August 31 due to palpitations.  He took an extra flecainide with no benefit.  He went to the emergency room for a second event in 2 days.    Today, he denies symptoms of palpitations, chest pain, shortness of breath, orthopnea, PND, lower extremity edema, claudication, dizziness, presyncope, syncope, bleeding, or neurologic sequela. The patient is tolerating medications without difficulties.  His main complaint is of fatigue and weakness.  He also has occasional palpitations.  He did have an episode of syncope a few months ago.  He tried to wear cardiac monitor that he did not tolerated due to a rash.   Past Medical History:  Diagnosis Date  . Allergy   . Arthritis    right knee  . Asthma    prn inhaler  . Barrett's esophagus 10-25-14 pt denies   states "minor"  . Chest pain    a. 06/2016 MV: EF 52%, no ischemia or infarct.  . 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 Deaf    Call [phone] for patient's sign language interpreter.   Marland Kitchen GERD (gastroesophageal reflux disease)   . Heartburn    occasional, related to certain foods  . High cholesterol   .  Intermittent palpitations 10-25-14 patient denies   a. Event Monitor 04/2014: Mostly NSR - intermittent PACs, - 2 brief runs of PAT/PSVT  . PSVT (paroxysmal supraventricular tachycardia) ([place])    a. 04/2014 Event Monitor: brief PAT/PSVT-->uses prn flecainide.  . Seasonal allergies   . Stroke [facility]) 2013   TIA  . TIA (transient ischemic attack) 10-25-14 pt denies   no current deficits  . Vasovagal syncope    Past Surgical History:  Procedure Laterality Date  . [place]  08/2015   Ranging from mostly sinus rhythm to occasional sinus bradycardia and sinus tachycardia. Heart rate from 53 bpm to 129 bpm. No documented PVCs or PACs noted. No arrhythmia noted. No PSVT or A. Fib. "Heart racing" noted with sinus tachycardia.  . Abdominal Aortic Ultrasound  2012   normal abdominal aorta  . BUNIONECTOMY Left 2012   right done also  . CARDIAC CATHETERIZATION  06/24/2011   wwidely patent normal coronary arteries with normal ejection fraction  . Carotid Artery Dopplers  2012   tortuous but no stenoses.. No subclavian disease  . COLONOSCOPY  2015  . COLONOSCOPY WITH PROPOFOL N/A 11/03/2014   Procedure: COLONOSCOPY WITH PROPOFOL;  Surgeon: Milus Banister, MD;  Location: [facility];  Service: Endoscopy;  Laterality: N/A;  . Foot Implant Removal Right 07/15/2013   @  [facility]  . INGUINAL HERNIA REPAIR    . KNEE ARTHROSCOPY Left 06/02/2012  . KNEE ARTHROSCOPY Right fall 2013   x 2  . KNEE ARTHROSCOPY Left 03/03/2013   Procedure: ARTHROSCOPY KNEE WITH CHONDROPLASTY PATELLA  AND LATERAL TIBIAL PLATEAU;  Surgeon: Kerin Salen, MD;  Location: [place];  Service: Orthopedics;  Laterality: Left;  . NM MYOVIEW LTD  06/2016   No evidence of ischemia or infarction.   Domingo Dimes ECHOCARDIOGRAM  June 2015, August 2018   A) normal EF: 55-60%. No wall motion abnormalities. Gr 1 DD.  Mild MR. Otherwise normal;; b) ([facility]) - Normal LV size and function.  EF 60-65%.  No significant  valvular abnormalities.  Marland Kitchen Upper and Lower Extremity Arterial Dopplers  2012   normal arterial flow bilateral upper extremities including subclavian is a carotid;;R. ABI 1.2, L. ABI 1.28. Normal flow velocities. Likely calcified vessels.  Marland Kitchen UPPER GASTROINTESTINAL ENDOSCOPY  2013     Current Outpatient Medications  Medication Sig Dispense Refill  . albuterol (PROVENTIL HFA;VENTOLIN HFA) 108 (90 BASE) MCG/ACT inhaler Inhale 1-2 puffs into the lungs every 6 (six) hours as needed for wheezing or shortness of breath. 1 Inhaler 0  . albuterol (PROVENTIL) (2.5 MG/3ML) 0.083% nebulizer solution Take 3 mLs (2.5 mg total) by nebulization every 4 (four) hours as needed for wheezing or shortness of breath. 30 vial 0  . aspirin EC 81 MG EC tablet Take 1 tablet (81 mg total) by mouth daily. 30 tablet 2  . flecainide (TAMBOCOR) 100 MG tablet Take 1 tablet (100 mg total) 2 (two) times daily by mouth. 180 tablet 3  . Fluticasone-Salmeterol (ADVAIR) 250-50 MCG/DOSE AEPB Inhale 1 puff into the lungs daily. 60 each 0  . omeprazole (PRILOSEC) 40 MG capsule Take 1 capsule (40 mg total) by mouth daily. Take 30 minutes before first meal of the day. 30 capsule 2   No current facility-administered medications for this visit.     Allergies:   Prednisone; Shellfish-derived products; Shrimp [shellfish allergy]; Simvastatin; Tamsulosin; Adhesive [tape]; Simvastatin - high dose  [simvastatin-high dose]; Fish allergy; and Fish-derived products   Social History:  The patient  reports that  has never smoked. he has never used smokeless tobacco. He reports that he drinks alcohol. He reports that he does not use drugs.   Family History:  The patient's family history includes Coronary artery disease in his father; Heart attack (age of onset: 31) in his brother; Heart attack (age of onset: 57) in his paternal grandfather; Heart attack (age of onset: 66) in his father; Ovarian cancer in his mother.    ROS:  Please see the  history of present illness.   Otherwise, review of systems is positive for hearing loss.   All other systems are reviewed and negative.    PHYSICAL EXAM: VS:  BP 112/74   Pulse 68   Ht 5\' 11"  (1.803 m)   Wt 180 lb (81.6 kg)   BMI 25.10 kg/m  , BMI Body mass index is 25.1 kg/m. GEN: Well nourished, well developed, in no acute distress  HEENT: normal  Neck: no JVD, carotid bruits, or masses Cardiac: RRR; no murmurs, rubs, or gallops,no edema  Respiratory:  clear to auscultation bilaterally, normal work of breathing GI: soft, nontender, nondistended, + BS MS: no deformity or atrophy  Skin: warm and dry Neuro:  Strength and sensation are intact Psych: euthymic mood, full affect  EKG:  EKG is not ordered today. Personal review of the  ekg ordered 11/14/17 shows sinus rhythm, rate 102  Recent Labs: 05/03/2017: Magnesium 2.2 05/04/2017: BUN 16; Creatinine, Ser 0.97; Hemoglobin 13.1; Platelets 182; Potassium 3.6; Sodium 139    Lipid Panel     Component Value Date/Time   CHOL 166 07/10/2016 0158   TRIG 180 (H) 07/10/2016 0158   HDL 26 (L) 07/10/2016 0158   CHOLHDL 6.4 07/10/2016 0158   VLDL 36 07/10/2016 0158   LDLCALC 104 (H) 07/10/2016 0158     Wt Readings from Last 3 Encounters:  11/24/17 180 lb (81.6 kg)  10/31/17 177 lb 6.4 oz (80.5 kg)  09/24/17 175 lb 3.2 oz (79.5 kg)      Other studies Reviewed: Additional studies/ records that were reviewed today include: TTE 2015  Review of the above records today demonstrates:  - Left ventricle: The cavity size was normal. Systolic function was normal. The estimated ejection fraction was in the range of 55% to 60%. Wall motion was normal; there were no regional wall motion abnormalities. Doppler parameters are consistent with abnormal left ventricular relaxation (grade 1 diastolic dysfunction). There was no evidence of elevated ventricular filling pressure by Doppler parameters. - Aortic valve: Trileaflet; normal  thickness leaflets. There was no regurgitation. - Aortic root: The aortic root was normal in size. - Mitral valve: Structurally normal valve. There was mild regurgitation. - Left atrium: The atrium was mildly dilated. - Right ventricle: Systolic function was normal. - Right atrium: The atrium was normal in size. - Tricuspid valve: There was no regurgitation. - Pulmonic valve: There was trivial regurgitation. - Pulmonary arteries: Systolic pressure was within the normal range. - Inferior vena cava: The vessel was normal in size. The respirophasic diameter changes were in the normal range (= 50%), consistent with normal central venous pressure.  30 day monitor 10/08/17 - personally reviewed  Mostly normal sinus rhythm with occasional sinus tachycardia and sinus arrhythmia.  Symptoms (chest pain and tightness) were noted with sinus rhythm, sinus tachycardia, sinus arrhythmia.  Very rare PVCs noted.  1 short run of 8-10 beats wide-complex tachycardia noted a symptomatic, but not long enough to cause any significant fin symptoms dings.  ASSESSMENT AND PLAN:  1.  PVCs: Currently on metoprolol and flecainide.  Has been controlled on beta-blockers in the past.  No changes.  2.  SVT: Only on metoprolol and flecainide.  Currently does have quite a bit of fatigue.  This may be due to his metoprolol, but his fatigue has been occurring for the last 10 years.  I am not clear if he has been having episodes of SVT recently.  He did not tolerate the monitor that he wore due to a rash.  We Dajanee Voorheis stop his metoprolol and start him on diltiazem.  3.  Syncope: Wore a 30-day monitor that showed no evidence of major tachycardia or bradycardia that would cause syncope.    Current medicines are reviewed at length with the patient today.   The patient does not have concerns regarding his medicines.  The following changes were made today: Stop metoprolol, start diltiazem  Labs/ tests ordered  today include:  No orders of the defined types were placed in this encounter.    Disposition:   FU with Tylor Gambrill 3 months  Signed, Raeven Pint Meredith Leeds, MD  11/24/2017 9:36 AM     [facility] [address] [place] [address] [phone] (office) [phone] (fax)

## 2017-11-25 NOTE — Progress Notes (Signed)
Maybe just do Linq -- tired of his continuous ER visits & complaints. Had only 1 short episode of ? SVT on an event monitor a few years ago.  He came here & was seen by Triage RN - basically was flopping around & "falling out" with STachy~110 bpm & normal BP.   Linq would confirm/deny if SVT present -- or even anything cardiac at all.    Glenetta Hew c

## 2017-12-01 ENCOUNTER — Encounter: Payer: Self-pay | Admitting: Cardiology

## 2017-12-01 ENCOUNTER — Ambulatory Visit (INDEPENDENT_AMBULATORY_CARE_PROVIDER_SITE_OTHER): Payer: Medicare Other | Admitting: Cardiology

## 2017-12-01 VITALS — BP 122/80 | HR 92 | Ht 71.0 in | Wt 180.4 lb

## 2017-12-01 DIAGNOSIS — I493 Ventricular premature depolarization: Secondary | ICD-10-CM | POA: Diagnosis not present

## 2017-12-01 DIAGNOSIS — R072 Precordial pain: Secondary | ICD-10-CM | POA: Diagnosis not present

## 2017-12-01 DIAGNOSIS — I471 Supraventricular tachycardia: Secondary | ICD-10-CM

## 2017-12-01 DIAGNOSIS — R55 Syncope and collapse: Secondary | ICD-10-CM

## 2017-12-01 MED ORDER — METOPROLOL TARTRATE 25 MG PO TABS: 25.0000 mg | ORAL_TABLET | Freq: Two times a day (BID) | ORAL | 11 refills | Status: DC

## 2017-12-01 NOTE — Progress Notes (Signed)
PCP: Patient, No Pcp Per  Clinic Note: Chief Complaint  Patient presents with  . Follow-up    PATIENT IS HAVING CHEST PAIN  . Palpitations    Near syncope    HPI: Nathaniel [name] is a 56 y.o. male with a PMH below who presents today for follow-up discussions of his palpitations, near syncopal episodes and evidence of at least one PAT/PSVT episode on a monitor.  He has recently been seen by Dr. Curt Bears from electrophysiology.  Nathaniel [name] was last seen on November 2 -I referred him to Dr. Curt Bears who we saw on November 26 -converted from metoprolol to diltiazem.  Continued on flecainide.  Discussed the possibility of Linq loop recorder, but according to the patient, Dr. Curt Bears was not enthusiastic about this at the time.  Recent Hospitalizations: None  Studies Personally Reviewed - (if available, images/films reviewed: From Epic Chart or Care Everywhere)  No new studies -his event recorder was reviewed elsewhere.  No arrhythmias noted.  Unfortunately was unable to wear it for long time.  History provided with the assistance of a sign language interpreter.  Interval History: Nathaniel [name] returns today, pretty much the same as he has been.  He took diltiazem for 4 days and then stopped it because he felt horrible.  He felt his heart racing he felt tremoring and shaking in his hands.  He felt itchy so he decided to stop taking the medication.  He has still been taking the flecainide however. He still notes that his heart still races and he still very worried about his syncopal episodes he is worried about these arrhythmias.  He is very upset that they did not put loop recorder in.  I tried to explain to him that he had symptoms while wearing the monitor and it did not show anything and therefore I am not sure what a loop recorder which show.  He still was quite upset. He still complains of strange chest discomfort episodes with or without his palpitations.  Otherwise no dyspnea with rest  or exertion.  He just seems to be very focused on these episodes always stating that his wife that he gets upset.  However he also had an episode when he came in to our office shortly after I last saw him with what amounted to be a panic attack.  No PND, orthopnea or edema.  No further episodes of syncope or near syncope -but he has had some palpitation episodes.  Not as much and not a significant. No TIA/amaurosis fugax symptoms. No melena, hematochezia, hematuria, or epstaxis. No claudication.  ROS: Pertinent symptoms noted above. 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   Musculoskeletal: Positive for joint pain (From a recent fall).  Psychiatric/Behavioral: The patient is nervous/anxious.    I have reviewed and (if needed) personally updated the patient's problem list, medications, allergies, past medical and surgical history, social and family history.   Past Medical History:  Diagnosis Date  . Allergy   . Arthritis    right knee  . Asthma    prn inhaler  . Barrett's esophagus 10-25-14 pt denies   states "minor"  . Chest pain    a. 06/2016 MV: EF 52%, no ischemia or infarct.  . 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 Deaf    Call [phone] for patient's sign language interpreter.   Marland Kitchen GERD (gastroesophageal reflux disease)   . Heartburn    occasional, related to certain foods  .  High cholesterol   . Intermittent palpitations 10-25-14 patient denies   a. Event Monitor 04/2014: Mostly NSR - intermittent PACs, - 2 brief runs of PAT/PSVT  . PSVT (paroxysmal supraventricular tachycardia) ([place])    a. 04/2014 Event Monitor: brief PAT/PSVT-->uses prn flecainide.  . Seasonal allergies   . Stroke [facility]) 2013   TIA  . TIA (transient ischemic attack) 10-25-14 pt denies   no current deficits  . Vasovagal syncope     Past Surgical History:  Procedure Laterality Date  . [place]  08/2015   Ranging from mostly sinus rhythm to  occasional sinus bradycardia and sinus tachycardia. Heart rate from 53 bpm to 129 bpm. No documented PVCs or PACs noted. No arrhythmia noted. No PSVT or A. Fib. "Heart racing" noted with sinus tachycardia.  . Abdominal Aortic Ultrasound  2012   normal abdominal aorta  . BUNIONECTOMY Left 2012   right done also  . CARDIAC CATHETERIZATION  06/24/2011   wwidely patent normal coronary arteries with normal ejection fraction  . Carotid Artery Dopplers  2012   tortuous but no stenoses.. No subclavian disease  . COLONOSCOPY  2015  . COLONOSCOPY WITH PROPOFOL N/A 11/03/2014   Procedure: COLONOSCOPY WITH PROPOFOL;  Surgeon: Milus Banister, MD;  Location: [facility];  Service: Endoscopy;  Laterality: N/A;  . Foot Implant Removal Right 07/15/2013   @ [place]  . INGUINAL HERNIA REPAIR    . KNEE ARTHROSCOPY Left 06/02/2012  . KNEE ARTHROSCOPY Right fall 2013   x 2  . KNEE ARTHROSCOPY Left 03/03/2013   Procedure: ARTHROSCOPY KNEE WITH CHONDROPLASTY PATELLA  AND LATERAL TIBIAL PLATEAU;  Surgeon: Kerin Salen, MD;  Location: [facility];  Service: Orthopedics;  Laterality: Left;  . NM MYOVIEW LTD  06/2016   No evidence of ischemia or infarction.   [facility] ECHOCARDIOGRAM  June 2015, August 2018   A) normal EF: 55-60%. No wall motion abnormalities. Gr 1 DD.  Mild MR. Otherwise normal;; b) ([facility]) - Normal LV size and function.  EF 60-65%.  No significant valvular abnormalities.  Marland Kitchen Upper and Lower Extremity Arterial Dopplers  2012   normal arterial flow bilateral upper extremities including subclavian is a carotid;;R. ABI 1.2, L. ABI 1.28. Normal flow velocities. Likely calcified vessels.  Marland Kitchen UPPER GASTROINTESTINAL ENDOSCOPY  2013    Current Meds  Medication Sig  . albuterol (PROVENTIL HFA;VENTOLIN HFA) 108 (90 BASE) MCG/ACT inhaler Inhale 1-2 puffs into the lungs every 6 (six) hours as needed for wheezing or shortness of breath.  Marland Kitchen albuterol (PROVENTIL) (2.5 MG/3ML) 0.083% nebulizer  solution Take 3 mLs (2.5 mg total) by nebulization every 4 (four) hours as needed for wheezing or shortness of breath.  Marland Kitchen aspirin EC 81 MG EC tablet Take 1 tablet (81 mg total) by mouth daily.  . flecainide (TAMBOCOR) 100 MG tablet Take 1 tablet (100 mg total) 2 (two) times daily by mouth.  . Fluticasone-Salmeterol (ADVAIR) 250-50 MCG/DOSE AEPB Inhale 1 puff into the lungs daily.  Marland Kitchen omeprazole (PRILOSEC) 40 MG capsule Take 1 capsule (40 mg total) by mouth daily. Take 30 minutes before first meal of the day.    Allergies  Allergen Reactions  . Prednisone Palpitations  . Shellfish-Derived Products Itching and Rash  . Shrimp [Shellfish Allergy] Rash and Itching  . Simvastatin Other (See Comments)    DEPRESSION, VISUAL DISTURBANCE(FLASHING LIGHTS)  . Tamsulosin Other (See Comments)    Other reaction(s): Confusion Other reaction(s): Other (See Comments) Changes in  behavior  Changes in behavior  Changes in behavior   . Adhesive [Tape]   . Simvastatin - High Dose  [Simvastatin-High Dose]     Depression, behavioral issues  . Fish Allergy Rash and Other (See Comments)    SOFTSHELL FISH  . Fish-Derived Products Rash and Other (See Comments)    SOFTSHELL FISH SOFTSHELL [place]    Social History   Socioeconomic History  . Marital status: Married    Spouse name: None  . Number of children: 1  . Years of education: college  . Highest education level: None  Social Needs  . Financial resource strain: None  . Food insecurity - worry: None  . Food insecurity - inability: None  . Transportation needs - medical: None  . Transportation needs - non-medical: None  Occupational History  . Occupation: disability  Tobacco Use  . Smoking status: Never Smoker  . Smokeless tobacco: Never Used  Substance and Sexual Activity  . Alcohol use: Yes    Alcohol/week: 0.0 oz    Comment: occ  . Drug use: No  . Sexual activity: None  Other Topics Concern  . None  Social History Narrative   He is deaf,  requiring an interpreter.   He started a new job back in 2013, which was much less stressful for him.  He has subsequently gone on to disability.   He is a married father of one. He previously wrote his bike 2 times a week.   Does not smoke or drink.          family history includes Coronary artery disease in his father; Heart attack (age of onset: 14) in his brother; Heart attack (age of onset: 35) in his paternal grandfather; Heart attack (age of onset: 30) in his father; Ovarian cancer in his mother.  Wt Readings from Last 3 Encounters:  12/01/17 180 lb 6.4 oz (81.8 kg)  11/24/17 180 lb (81.6 kg)  10/31/17 177 lb 6.4 oz (80.5 kg)    PHYSICAL EXAM BP 122/80   Pulse 92   Ht 5\' 11"  (1.803 m)   Wt 180 lb 6.4 oz (81.8 kg)   SpO2 97%   BMI 25.16 kg/m  Physical Exam  Constitutional: He is oriented to person, place, and time. He appears well-developed and well-nourished. No distress.  Healthy-appearing.  HENT:  Head: Normocephalic and atraumatic.  Deaf.  He communicates using sign language with some lip movement.  He reads lips.  Eyes: EOM are normal.  Neck: No hepatojugular reflux and no JVD present. Carotid bruit is not present.  Cardiovascular: Normal rate, regular rhythm, normal heart sounds and normal pulses.  No extrasystoles are present. PMI is not displaced. Exam reveals no gallop.  No murmur heard. Pulmonary/Chest: Effort normal and breath sounds normal. No respiratory distress. He has no wheezes. He has no rales.  Abdominal: Soft. Bowel sounds are normal. He exhibits no distension. There is no tenderness.  Musculoskeletal: Normal range of motion. He exhibits no edema.  Neurological: He is alert and oriented to person, place, and time.  Psychiatric: Thought content normal.  Again continues to perseverate on his episodes.  He seems to be very upset that loop recorder was not placed.  Despite my best efforts in trying to calm his fears.  Nursing note and vitals  reviewed.    Adult ECG Report  Rate: 86;  Rhythm: normal sinus rhythm and Normal axis, intervals and durations.  No abnormalities.;   Narrative Interpretation: Stable  Other studies Reviewed: Additional  studies/ records that were reviewed today include:  Recent Labs: No recent labs  ASSESSMENT / PLAN:  Nathaniel [name] is a very difficult to see in a routine clinic visit because of the use of the interpreter.  He seems to want to answer the questions however he decides to answer and not exactly answer the question that is asked.  He will go off on a tangent reiterating appointment already made and not answer pointed question.  This makes the visit very difficult and frustrating for both of [place] because of the lack of communication and requirement of sign language interpreter.  As a result, any visit with him lasts at least 45 minutes 1 hour.  This current visit was 40-45 minutes.  Greater than 50% of the time (in fact almost all the time) was spent in direct counseling and communication.  Problem List Items Addressed This Visit    Pain in the chest    He has had a full ischemic workup in the past, I am not going to workup intermittent episodes of chest discomfort that are not anginal in nature.      PVC (premature ventricular contraction) - Primary (Chronic)   Relevant Medications   metoprolol tartrate (LOPRESSOR) 25 MG tablet   SVT (supraventricular tachycardia) (HCC) (Chronic)    This is a very difficult situation.  He is now essentially recalcitrant about trying new medications.  He refuses to take diltiazem.  He is convinced that metoprolol is making him fatigued.  He feels better not being on either 1 of medications.  He is acceptable using as needed metoprolol as described in the patient instruction section below. As a result, we will simply continue flecainide.  We discussed vagal maneuvers. 1 next consideration would be to can use digoxin.  As such we will check a basic metabolic panel.  He  remains very adamant that he was upset about not having a loop recorder placed.  At this point, I think it may not be that bad idea to place a loop recorder in order to switch his fears about dangerous arrhythmias and to fully understand when he is having these passout spells that are relatively infrequent although he perseverates on the 1 or 2 occasions.  Knowing truthfully what is occurring would be beneficial because the only thing that we have saying that there was an SVT was short spell of either SVT or PAT on monitor 2 years ago.  I will discussed with Dr. Curt Bears prior to his follow-up to see him in a few months,   But will defer to his judgment.      Relevant Medications   metoprolol tartrate (LOPRESSOR) 25 MG tablet   Other Relevant Orders   EKG 12-Lead (Completed)   Syncope    I am not really sure if his syncopal episode was related to SVT or not.  Could have been vasovagal mediated.  At this point we really have no way of knowing what these episodes are.  For that reason I thought a loop recorder may not be a bad idea.  Will defer to PCP, but the patient himself is convinced that he wants one and it may not be a bad idea just for the sake of switching his fears about dangerous cardiac rhythms.  I tried to reassure him as I have done every time I see him.      Relevant Medications   metoprolol tartrate (LOPRESSOR) 25 MG tablet      Current medicines are reviewed at length  with the patient today. (+/- concerns) he stopped taking diltiazem.  Does not want to take metoprolol.  Does not want to try new medications. The following changes have been made:Continue flecainide.  Discussed the possibility of digoxin.  Discussed vagal maneuvers. Discussed as needed dose of metoprolol.  Patient Instructions  MEDICATION INSTRUCTIONS  KEEP TAKING FLECAINIDE 100 MG TWICE DAILY   IF YOU HAVE AN EPISODE WHEN YOUR HEART IS BEATING FAST - SITTING DOWN AND PUTTING YOUR FEET UP Recommendations for  vagal maneuvers:  "Bearing down"  Coughing  Gagging  Icy, cold towel on face or drink ice cold water   TAKE A DOSE METOPROLOL 25 MG   AND THEN TAKE IT  TWICE A DAY FOR A WEEK THEN DECREASE TO 25 MG  A DAY FOR A WEEK THEN STOP UNTIL NEEDED ON AN AS NEEDED BASIS.   Your physician wants you to follow-up in April 2019 WITH DR [facility]. You will receive a reminder letter in the mail two months in advance. If you don't receive a letter, please call our office to schedule the follow-up appointment.   If you need a refill on your cardiac medications before your next appointment, please call your pharmacy   Studies Ordered:   Orders Placed This Encounter  Procedures  . EKG 12-Lead      Glenetta Hew, M.D., M.S. Interventional Cardiologist   Pager # [phone] Phone # [phone] [address] [place], [place] [address]

## 2017-12-01 NOTE — Patient Instructions (Addendum)
MEDICATION INSTRUCTIONS  KEEP TAKING FLECAINIDE 100 MG TWICE DAILY   IF YOU HAVE AN EPISODE WHEN YOUR HEART IS BEATING FAST - SITTING DOWN AND PUTTING YOUR FEET UP Recommendations for vagal maneuvers:  "Bearing down"  Coughing  Gagging  Icy, cold towel on face or drink ice cold water   TAKE A DOSE METOPROLOL 25 MG   AND THEN TAKE IT  TWICE A DAY FOR A WEEK THEN DECREASE TO 25 MG  A DAY FOR A WEEK THEN STOP UNTIL NEEDED ON AN AS NEEDED BASIS.   Your physician wants you to follow-up in April 2019 WITH DR [facility]. You will receive a reminder letter in the mail two months in advance. If you don't receive a letter, please call our office to schedule the follow-up appointment.   If you need a refill on your cardiac medications before your next appointment, please call your pharmacy

## 2017-12-02 ENCOUNTER — Encounter: Payer: Self-pay | Admitting: Cardiology

## 2017-12-02 NOTE — Assessment & Plan Note (Signed)
I am not really sure if his syncopal episode was related to SVT or not.  Could have been vasovagal mediated.  At this point we really have no way of knowing what these episodes are.  For that reason I thought a loop recorder may not be a bad idea.  Will defer to PCP, but the patient himself is convinced that he wants one and it may not be a bad idea just for the sake of switching his fears about dangerous cardiac rhythms.  I tried to reassure him as I have done every time I see him.

## 2017-12-02 NOTE — Assessment & Plan Note (Signed)
This is a very difficult situation.  He is now essentially recalcitrant about trying new medications.  He refuses to take diltiazem.  He is convinced that metoprolol is making him fatigued.  He feels better not being on either 1 of medications.  He is acceptable using as needed metoprolol as described in the patient instruction section below. As a result, we will simply continue flecainide.  We discussed vagal maneuvers. 1 next consideration would be to can use digoxin.  As such we will check a basic metabolic panel.  He remains very adamant that he was upset about not having a loop recorder placed.  At this point, I think it may not be that bad idea to place a loop recorder in order to switch his fears about dangerous arrhythmias and to fully understand when he is having these passout spells that are relatively infrequent although he perseverates on the 1 or 2 occasions.  Knowing truthfully what is occurring would be beneficial because the only thing that we have saying that there was an SVT was short spell of either SVT or PAT on monitor 2 years ago.  I will discussed with Dr. Curt Bears prior to his follow-up to see him in a few months,   But will defer to his judgment.

## 2017-12-02 NOTE — Assessment & Plan Note (Signed)
He has had a full ischemic workup in the past, I am not going to workup intermittent episodes of chest discomfort that are not anginal in nature.

## 2017-12-03 ENCOUNTER — Telehealth: Payer: Self-pay | Admitting: Cardiology

## 2017-12-03 NOTE — Telephone Encounter (Signed)
Left message for pharmacy, aware diltiazem was stopped by the patient and not restarted due to side effects.

## 2017-12-03 NOTE — Telephone Encounter (Signed)
Follow Up:    The pharmacist wants to know if the doctor cx pt's Diltiazem prescription?

## 2017-12-15 ENCOUNTER — Telehealth: Payer: Self-pay | Admitting: Gastroenterology

## 2017-12-15 NOTE — Telephone Encounter (Signed)
Relay service called back, patient continues to have rectal bleeding that does not stop. Patient very concerned and is heading to the ED for evaluation.

## 2017-12-15 NOTE — Telephone Encounter (Signed)
Sign language. Phone number given says "your call cannot be taken at this time." Will try again.

## 2018-01-18 ENCOUNTER — Emergency Department (HOSPITAL_BASED_OUTPATIENT_CLINIC_OR_DEPARTMENT_OTHER): Payer: Medicare Other

## 2018-01-18 ENCOUNTER — Emergency Department (HOSPITAL_BASED_OUTPATIENT_CLINIC_OR_DEPARTMENT_OTHER)
Admission: EM | Admit: 2018-01-18 | Discharge: 2018-01-18 | Disposition: A | Discharge status: Home / Self Care | Payer: Medicare Other | Attending: Emergency Medicine | Admitting: Emergency Medicine

## 2018-01-18 ENCOUNTER — Encounter (HOSPITAL_BASED_OUTPATIENT_CLINIC_OR_DEPARTMENT_OTHER): Payer: Self-pay | Admitting: Emergency Medicine

## 2018-01-18 ENCOUNTER — Other Ambulatory Visit: Payer: Self-pay

## 2018-01-18 DIAGNOSIS — Z8673 Personal history of transient ischemic attack (TIA), and cerebral infarction without residual deficits: Secondary | ICD-10-CM | POA: Diagnosis not present

## 2018-01-18 DIAGNOSIS — Z79899 Other long term (current) drug therapy: Secondary | ICD-10-CM | POA: Diagnosis not present

## 2018-01-18 DIAGNOSIS — Z7982 Long term (current) use of aspirin: Secondary | ICD-10-CM | POA: Insufficient documentation

## 2018-01-18 DIAGNOSIS — R0789 Other chest pain: Secondary | ICD-10-CM | POA: Insufficient documentation

## 2018-01-18 DIAGNOSIS — J45909 Unspecified asthma, uncomplicated: Secondary | ICD-10-CM | POA: Insufficient documentation

## 2018-01-18 DIAGNOSIS — I471 Supraventricular tachycardia: Secondary | ICD-10-CM | POA: Insufficient documentation

## 2018-01-18 DIAGNOSIS — R079 Chest pain, unspecified: Secondary | ICD-10-CM | POA: Diagnosis present

## 2018-01-18 LAB — CBC
HEMATOCRIT: 41.8 % (ref 39.0–52.0)
HEMOGLOBIN: 15 g/dL (ref 13.0–17.0)
MCH: 32.7 pg (ref 26.0–34.0)
MCHC: 35.9 g/dL (ref 30.0–36.0)
MCV: 91.1 fL (ref 78.0–100.0)
Platelets: 203 10*3/uL (ref 150–400)
RBC: 4.59 MIL/uL (ref 4.22–5.81)
RDW: 12.8 % (ref 11.5–15.5)
WBC: 5.6 10*3/uL (ref 4.0–10.5)

## 2018-01-18 LAB — D-DIMER, QUANTITATIVE (NOT AT ARMC)

## 2018-01-18 LAB — BASIC METABOLIC PANEL
ANION GAP: 11 (ref 5–15)
BUN: 20 mg/dL (ref 6–20)
CHLORIDE: 108 mmol/L (ref 101–111)
CO2: 18 mmol/L — ABNORMAL LOW (ref 22–32)
Calcium: 8.7 mg/dL — ABNORMAL LOW (ref 8.9–10.3)
Creatinine, Ser: 0.92 mg/dL (ref 0.61–1.24)
GFR calc Af Amer: >60 mL/min (ref >60)
Glucose, Bld: 153 mg/dL — ABNORMAL HIGH (ref 65–99)
POTASSIUM: 4.2 mmol/L (ref 3.5–5.1)
SODIUM: 137 mmol/L (ref 135–145)

## 2018-01-18 LAB — TROPONIN I: Troponin I: <0.03 ng/mL (ref <0.03)

## 2018-01-18 MED ORDER — ACETAMINOPHEN 325 MG PO TABS
650.0000 mg | ORAL_TABLET | Freq: Once | ORAL | Status: AC
  Administered 2018-01-18: 650 mg via ORAL
  Filled 2018-01-18: qty 2

## 2018-01-18 MED ORDER — ASPIRIN 81 MG PO CHEW
81.0000 mg | CHEWABLE_TABLET | Freq: Once | ORAL | Status: AC
Start: 2018-01-18 — End: 2018-01-18
  Administered 2018-01-18: 81 mg via ORAL
  Filled 2018-01-18: qty 1

## 2018-01-18 NOTE — Discharge Instructions (Signed)
Please read attached information regarding your condition. Call your cardiologist tomorrow to schedule an appointment as soon as possible for further evaluation. Take Tylenol or aspirin as needed for your pain. Continue your home medications as previously prescribed. Return to ED for worsening chest pain, trouble breathing, trouble swallowing, wheezing, injuries or falls, lightheadedness or loss of consciousness.

## 2018-01-18 NOTE — ED Notes (Signed)
Pt on cardiac monitor and auto VS 

## 2018-01-18 NOTE — ED Provider Notes (Signed)
[facility] EMERGENCY DEPARTMENT Provider Note   CSN: [id number] Arrival date & time: 01/18/18  1145     History   Chief Complaint Chief Complaint  Patient presents with  . Chest Pain    HPI Nathaniel [name] is a 57 y.o. male with a past medical history of deafness, PSVT, hyperlipidemia, GERD, who presents to ED for evaluation of 5-day history of intermittent chest pressure, feelings of palpitations described as "my heartbeat going up and down."  Because the symptoms were intermittent, he decided to stay home and wait till they improved.  He spoke to his cardiologist a few weeks ago who wanted to increase his dose of flecainide to help with his heart rate.  Describes that the pain got a lot worse while at church earlier today.  He then came to the emergency room.  Since arrival here, he describes the dullness has resided significantly but is still located in the center of his chest and worse with movement and breathing.  He also reports intermittent shortness of breath.  He states that the feelings of palpitations have improved.  States that this feels similar to a few months ago when he was told that he had a heart attack.  He reports one episode of vomiting 4 days ago. Upon further chart review, patient presented to the ED approximately 16 months ago with overnight stay to rule out ACS.  At that time stress testing was performed that showed no evidence of ischemia or infarction. This appears to have been done twice, at [facility] and [facility].  The history is provided by the patient. A language interpreter was used.    Past Medical History:  Diagnosis Date  . Allergy   . Arthritis    right knee  . Asthma    prn inhaler  . Barrett's esophagus 10-25-14 pt denies   states "minor"  . Chest pain    a. 06/2016 MV: EF 52%, no ischemia or infarct.  . 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 Deaf    Call [phone] for patient's sign language interpreter.   Marland Kitchen GERD (gastroesophageal  reflux disease)   . Heartburn    occasional, related to certain foods  . High cholesterol   . Intermittent palpitations 10-25-14 patient denies   a. Event Monitor 04/2014: Mostly NSR - intermittent PACs, - 2 brief runs of PAT/PSVT  . PSVT (paroxysmal supraventricular tachycardia) ([place])    a. 04/2014 Event Monitor: brief PAT/PSVT-->uses prn flecainide.  . Seasonal allergies   . Stroke [facility]) 2013   TIA  . TIA (transient ischemic attack) 10-25-14 pt denies   no current deficits  . Vasovagal syncope     Patient Active Problem List   Diagnosis Date Noted  . High cholesterol   . Pain in the chest   . Syncope 05/11/2015  . Chest pain of uncertain etiology 64/40/3474  . Sigmoid polyp 11/03/2014  . Barrett's esophagus 10/19/2014  . OSA (obstructive sleep apnea) 07/15/2014  . SVT (supraventricular tachycardia) ([place]) 06/18/2014  . Insomnia 06/18/2014  . Palpitations 05/19/2014  . Normal coronary arteries- 2006 and June 2012 05/19/2014  . PVC (premature ventricular contraction) 05/19/2014  . Chest pain 05/19/2014  . Dyspnea 05/19/2014  . Unspecified visual disturbance 11/02/2013  . Metatarsalgia of right foot 08/20/2013  . Hyperlipidemia 12/22/2012  . Hearing impaired 12/21/2012  . TIA (transient ischemic attack) 12/20/2012  . CVA (cerebral infarction) 12/20/2012  . History of asthma 12/20/2012  . HEARTBURN 10/17/2009    Past Surgical  History:  Procedure Laterality Date  . [place]  08/2015   Ranging from mostly sinus rhythm to occasional sinus bradycardia and sinus tachycardia. Heart rate from 53 bpm to 129 bpm. No documented PVCs or PACs noted. No arrhythmia noted. No PSVT or A. Fib. "Heart racing" noted with sinus tachycardia.  . Abdominal Aortic Ultrasound  2012   normal abdominal aorta  . BUNIONECTOMY Left 2012   right done also  . CARDIAC CATHETERIZATION  06/24/2011   wwidely patent normal coronary arteries with normal ejection fraction  . Carotid Artery  Dopplers  2012   tortuous but no stenoses.. No subclavian disease  . COLONOSCOPY  2015  . COLONOSCOPY WITH PROPOFOL N/A 11/03/2014   Procedure: COLONOSCOPY WITH PROPOFOL;  Surgeon: Milus Banister, MD;  Location: [facility] ENDOSCOPY;  Service: Endoscopy;  Laterality: N/A;  . Foot Implant Removal Right 07/15/2013   @ [place]  . INGUINAL HERNIA REPAIR    . KNEE ARTHROSCOPY Left 06/02/2012  . KNEE ARTHROSCOPY Right fall 2013   x 2  . KNEE ARTHROSCOPY Left 03/03/2013   Procedure: ARTHROSCOPY KNEE WITH CHONDROPLASTY PATELLA  AND LATERAL TIBIAL PLATEAU;  Surgeon: Kerin Salen, MD;  Location: [facility];  Service: Orthopedics;  Laterality: Left;  . NM MYOVIEW LTD  06/2016   No evidence of ischemia or infarction.   Domingo Dimes ECHOCARDIOGRAM  June 2015, August 2018   A) normal EF: 55-60%. No wall motion abnormalities. Gr 1 DD.  Mild MR. Otherwise normal;; b) ([facility]) - Normal LV size and function.  EF 60-65%.  No significant valvular abnormalities.  Marland Kitchen Upper and Lower Extremity Arterial Dopplers  2012   normal arterial flow bilateral upper extremities including subclavian is a carotid;;R. ABI 1.2, L. ABI 1.28. Normal flow velocities. Likely calcified vessels.  Marland Kitchen UPPER GASTROINTESTINAL ENDOSCOPY  2013       Home Medications    Prior to Admission medications   Medication Sig Start Date End Date Taking? Authorizing Provider  albuterol (PROVENTIL HFA;VENTOLIN HFA) 108 (90 BASE) MCG/ACT inhaler Inhale 1-2 puffs into the lungs every 6 (six) hours as needed for wheezing or shortness of breath. 04/16/15   Pamella Pert, MD  albuterol (PROVENTIL) (2.5 MG/3ML) 0.083% nebulizer solution Take 3 mLs (2.5 mg total) by nebulization every 4 (four) hours as needed for wheezing or shortness of breath. 03/06/16   Gareth Morgan, MD  aspirin EC 81 MG EC tablet Take 1 tablet (81 mg total) by mouth daily. 05/05/17   Alphonzo Grieve, MD  flecainide (TAMBOCOR) 100 MG tablet Take 1 tablet (100 mg total) 2 (two)  times daily by mouth. 11/14/17   Leonie Man, MD  Fluticasone-Salmeterol (ADVAIR) 250-50 MCG/DOSE AEPB Inhale 1 puff into the lungs daily. 03/06/16   Gareth Morgan, MD  metoprolol tartrate (LOPRESSOR) 25 MG tablet Take 1 tablet (25 mg total) by mouth 2 (two) times daily. as needed 12/01/17 03/01/18  Leonie Man, MD  omeprazole (PRILOSEC) 40 MG capsule Take 1 capsule (40 mg total) by mouth daily. Take 30 minutes before first meal of the day. 05/04/17   Alphonzo Grieve, MD    Family History Family History  Problem Relation Age of Onset  . Ovarian cancer Mother   . Coronary artery disease Father   . Heart attack Father 70  . Heart attack Brother 55  . Heart attack Paternal Grandfather 61  . Colon cancer Neg Hx   . Stomach cancer Neg Hx   . Esophageal cancer  Neg Hx   . Rectal cancer Neg Hx     Social History Social History   Tobacco Use  . Smoking status: Never Smoker  . Smokeless tobacco: Never Used  Substance Use Topics  . Alcohol use: Yes    Alcohol/week: 0.0 oz    Comment: occ  . Drug use: No     Allergies   Prednisone; Shellfish-derived products; Shrimp [shellfish allergy]; Simvastatin; Tamsulosin; Adhesive [tape]; Simvastatin - high dose  [simvastatin-high dose]; Fish allergy; and Fish-derived products   Review of Systems Review of Systems  Constitutional: Positive for activity change. Negative for appetite change and chills.  HENT: Negative for ear pain, rhinorrhea, sneezing and sore throat.   Eyes: Negative for photophobia and visual disturbance.  Respiratory: Negative for chest tightness and wheezing.   Gastrointestinal: Negative for blood in stool, constipation and diarrhea.  Genitourinary: Negative for dysuria, hematuria and urgency.  Musculoskeletal: Negative for myalgias.  Skin: Negative for rash.  Neurological: Negative for light-headedness.     Physical Exam Updated Vital Signs BP 130/87   Pulse 81   Temp 98 F (36.7 C) (Oral)   Resp 18   Ht  5\' 11"  (1.803 m)   Wt 82.1 kg (181 lb)   SpO2 94%   BMI 25.24 kg/m   Physical Exam  Constitutional: He appears well-developed and well-nourished. No distress.  Nontoxic appearing and in no acute distress.  Speaking complete sentences with no signs of airway compromise or respiratory distress.  HENT:  Head: Normocephalic and atraumatic.  Nose: Nose normal.  Eyes: Conjunctivae and EOM are normal. Right eye exhibits no discharge. Left eye exhibits no discharge. No scleral icterus.  Neck: Normal range of motion. Neck supple.  Cardiovascular: Normal rate, regular rhythm, normal heart sounds and intact distal pulses. Exam reveals no gallop and no friction rub.  No murmur heard. Pulmonary/Chest: Effort normal and breath sounds normal. No respiratory distress.  No chest tenderness to palpation.  Not tachycardic or tachypneic.  Abdominal: Soft. Bowel sounds are normal. He exhibits no distension. There is no tenderness. There is no guarding.  Musculoskeletal: Normal range of motion. He exhibits no edema.  No lower extremity edema, erythema or calf tenderness bilaterally.  Neurological: He is alert. He exhibits normal muscle tone. Coordination normal.  Skin: Skin is warm and dry. No rash noted.  Psychiatric: He has a normal mood and affect.  Nursing note and vitals reviewed.    ED Treatments / Results  Labs (all labs ordered are listed, but only abnormal results are displayed) Labs Reviewed  BASIC METABOLIC PANEL - Abnormal; Notable for the following components:      Result Value   CO2 18 (*)    Glucose, Bld 153 (*)    Calcium 8.7 (*)    All other components within normal limits  CBC  TROPONIN I  D-DIMER, QUANTITATIVE (NOT AT [facility])  TROPONIN I    EKG  EKG Interpretation  Date/Time:  Sunday January 18 2018 11:51:52 EST Ventricular Rate:  101 PR Interval:  184 QRS Duration: 94 QT Interval:  370 QTC Calculation: 479 R Axis:   14 Text Interpretation:  Sinus tachycardia Septal  infarct , age undetermined Abnormal ECG rate faster, otherwise ST elevations similar to May 2018 Confirmed by Sherwood Gambler [phone]) on 01/18/2018 12:03:53 PM Also confirmed by Sherwood Gambler [phone]), editor Laurena Spies [phone])  on 01/18/2018 12:10:47 PM       Radiology Dg Chest 2 View  Result Date: 01/18/2018 CLINICAL DATA:  Mid  chest pain. EXAM: CHEST  2 VIEW COMPARISON:  08/25/2017 FINDINGS: Both lungs are clear. Heart and mediastinum are within normal limits. Chronic vertebral body compression deformity in the mid thoracic spine. No large pleural effusions. IMPRESSION: No active cardiopulmonary disease. Electronically Signed   By: Markus Daft M.D.   On: 01/18/2018 12:16    Procedures Procedures (including critical care time)  Medications Ordered in ED Medications  acetaminophen (TYLENOL) tablet 650 mg (650 mg Oral Given 01/18/18 1415)  aspirin chewable tablet 81 mg (81 mg Oral Given 01/18/18 1414)     Initial Impression / Assessment and Plan / ED Course  I have reviewed the triage vital signs and the nursing notes.  Pertinent labs & imaging results that were available during my care of the patient were reviewed by me and considered in my medical decision making (see chart for details).     Patient presents to ED for evaluation of 5-day history of intermittent chest pressure, feelings of palpitations described his heartbeat increasing and decreasing.  History of similar symptoms in the past.  He does have a history of deafness, PSVT on flecainide, hyperlipidemia and GERD.  Since arrival in the ED he describes a dullness in his chest has resided significantly but still located in the center of his chest.  He also reports intermittent shortness of breath.  On physical exam he is overall well-appearing.  He is not tachycardic or tachypneic and no increased work of breathing is noted.  States that this feels similar to his heart attack a few months ago.  He states that he was seen at  [facility] for this.  However, upon further chart review it appears that patient was admitted for ACS rule out with negative workup at that time.  He had a cardiac cath done 5 years ago which was unremarkable as well as a stress test at that time.  He was also admitted for ACS rule out here at [facility] approximately 16 months ago.  He is followed by his cardiologist.  Due to patient's age, slight tachycardia at presentation, will obtain labs to rule out MI, PE.  D-dimer was unremarkable.  EKG with sinus tachycardia but no signs of SVT or other ischemic changes.  Chest x-ray returned as negative for any acute abnormality.  Troponin and delta troponin were both negative.  CBC and BMP were both unremarkable.  He reports complete resolution of symptoms with Tylenol and aspirin given here in the ED.  Based on his negative workup, history and physical exam findings, I suspect that his symptoms are not cardiac or pulmonary in nature.  I did tell him that he should follow-up with his cardiologist as soon as possible for further evaluation and any medication adjustments.  Patient agrees to this plan.  I encouraged him to keep his current dose of flecainide until evaluated by his cardiologist.  Advised to take Tylenol as needed for pain.  The patient appears stable for discharge at this time.  Strict return precautions given.  Patient discussed with Dr. Regenia Skeeter.  Final Clinical Impressions(s) / ED Diagnoses   Final diagnoses:  Chest wall pain    ED Discharge Orders    None     Portions of this note were generated with Dragon dictation software. Dictation errors may occur despite best attempts at proofreading.    Delia Heady, PA-C 01/18/18 1659    Sherwood Gambler, MD 01/19/18 [phone]

## 2018-01-18 NOTE — ED Notes (Signed)
Pt reports on Tues/Wed he began to feel like his HR was low, wife stated he was pale and he felt tired, but his symptoms resolved. Pt reports he was in church today when he felt his "heartbeat was off." He states it felt low and then high. He then developed a stabbing left chest pain that radiated to left arm and left shoulder. He reports associated dizziness, shortness of breath, and nausea. He reports hx of MI but denies stent placement, last cardiac cath was approximately 5 years ago, and no hx of CABG. He reports most of his cardiac history is within [facility]. He reports admission to [facility] for 2-3 days at the end of August for similar symptoms. He states that Dr. Ellyn Hack was supposed to order a Holter Monitor but never did follow through with the order. He reports he took Flecainide 100mg  that his Cardiologist prescribed (Dr. Ellyn Hack). He is upset and feels like his cardiologist is "giving him the run around."

## 2018-01-18 NOTE — ED Triage Notes (Signed)
Pt is deaf. Via sign language interpreter, pt c/o palpitations and chest pain x 45 min. Pt refusing to have EKG done until chest hair is shaved. Pt endorses SOB.

## 2018-02-20 ENCOUNTER — Other Ambulatory Visit: Payer: Self-pay | Admitting: Gastroenterology

## 2018-02-20 ENCOUNTER — Other Ambulatory Visit: Payer: Self-pay | Admitting: Internal Medicine

## 2018-02-22 ENCOUNTER — Other Ambulatory Visit: Payer: Self-pay

## 2018-02-22 ENCOUNTER — Encounter (HOSPITAL_BASED_OUTPATIENT_CLINIC_OR_DEPARTMENT_OTHER): Payer: Self-pay | Admitting: Emergency Medicine

## 2018-02-22 ENCOUNTER — Emergency Department (HOSPITAL_BASED_OUTPATIENT_CLINIC_OR_DEPARTMENT_OTHER)
Admission: EM | Admit: 2018-02-22 | Discharge: 2018-02-22 | Disposition: A | Discharge status: Home / Self Care | Payer: Medicare Other | Attending: Emergency Medicine | Admitting: Emergency Medicine

## 2018-02-22 ENCOUNTER — Emergency Department (HOSPITAL_BASED_OUTPATIENT_CLINIC_OR_DEPARTMENT_OTHER): Payer: Medicare Other

## 2018-02-22 DIAGNOSIS — J069 Acute upper respiratory infection, unspecified: Secondary | ICD-10-CM | POA: Insufficient documentation

## 2018-02-22 DIAGNOSIS — Z79899 Other long term (current) drug therapy: Secondary | ICD-10-CM | POA: Diagnosis not present

## 2018-02-22 DIAGNOSIS — Z7982 Long term (current) use of aspirin: Secondary | ICD-10-CM | POA: Diagnosis not present

## 2018-02-22 DIAGNOSIS — J45909 Unspecified asthma, uncomplicated: Secondary | ICD-10-CM | POA: Diagnosis not present

## 2018-02-22 DIAGNOSIS — B9789 Other viral agents as the cause of diseases classified elsewhere: Secondary | ICD-10-CM | POA: Diagnosis not present

## 2018-02-22 DIAGNOSIS — R079 Chest pain, unspecified: Secondary | ICD-10-CM | POA: Diagnosis present

## 2018-02-22 LAB — CBC
HCT: 40.3 % (ref 39.0–52.0)
Hemoglobin: 13.6 g/dL (ref 13.0–17.0)
MCH: 30.8 pg (ref 26.0–34.0)
MCHC: 33.7 g/dL (ref 30.0–36.0)
MCV: 91.2 fL (ref 78.0–100.0)
PLATELETS: 226 10*3/uL (ref 150–400)
RBC: 4.42 MIL/uL (ref 4.22–5.81)
RDW: 12.8 % (ref 11.5–15.5)
WBC: 6.6 10*3/uL (ref 4.0–10.5)

## 2018-02-22 LAB — BASIC METABOLIC PANEL
Anion gap: 11 (ref 5–15)
BUN: 12 mg/dL (ref 6–20)
CALCIUM: 8.7 mg/dL — AB (ref 8.9–10.3)
CO2: 21 mmol/L — ABNORMAL LOW (ref 22–32)
CREATININE: 0.94 mg/dL (ref 0.61–1.24)
Chloride: 106 mmol/L (ref 101–111)
GFR calc non Af Amer: >60 mL/min (ref >60)
GLUCOSE: 177 mg/dL — AB (ref 65–99)
Potassium: 3.7 mmol/L (ref 3.5–5.1)
Sodium: 138 mmol/L (ref 135–145)

## 2018-02-22 LAB — TROPONIN I: Troponin I: <0.03 ng/mL (ref <0.03)

## 2018-02-22 LAB — D-DIMER, QUANTITATIVE: D-Dimer, Quant: <0.27 ug/mL-FEU (ref 0.00–0.50)

## 2018-02-22 MED ORDER — METHYLPREDNISOLONE SODIUM SUCC 125 MG IJ SOLR
INTRAMUSCULAR | Status: AC
  Filled 2018-02-22: qty 2

## 2018-02-22 MED ORDER — DIPHENHYDRAMINE HCL 50 MG/ML IJ SOLN
INTRAMUSCULAR | Status: AC
  Administered 2018-02-22: 16:00:00
  Filled 2018-02-22: qty 1

## 2018-02-22 MED ORDER — IOPAMIDOL (ISOVUE-300) INJECTION 61%
100.0000 mL | Freq: Once | INTRAVENOUS | Status: AC | PRN
  Administered 2018-02-22: 80 mL via INTRAVENOUS

## 2018-02-22 MED ORDER — GUAIFENESIN-CODEINE 100-10 MG/5ML PO SYRP: 5.0000 mL | ORAL_SOLUTION | Freq: Three times a day (TID) | ORAL | 0 refills | Status: DC | PRN

## 2018-02-22 MED ORDER — ALBUTEROL SULFATE (2.5 MG/3ML) 0.083% IN NEBU: 2.5000 mg | INHALATION_SOLUTION | Freq: Four times a day (QID) | RESPIRATORY_TRACT | 12 refills | Status: DC | PRN

## 2018-02-22 MED ORDER — IPRATROPIUM-ALBUTEROL 0.5-2.5 (3) MG/3ML IN SOLN
3.0000 mL | Freq: Once | RESPIRATORY_TRACT | Status: AC
  Administered 2018-02-22: 3 mL via RESPIRATORY_TRACT
  Filled 2018-02-22: qty 3

## 2018-02-22 MED ORDER — BENZONATATE 100 MG PO CAPS: 200.0000 mg | ORAL_CAPSULE | Freq: Three times a day (TID) | ORAL | 0 refills | Status: DC

## 2018-02-22 NOTE — ED Provider Notes (Signed)
[facility] EMERGENCY DEPARTMENT Provider Note   CSN: [id number] Arrival date & time: 02/22/18  1230     History   Chief Complaint Chief Complaint  Patient presents with  . Chest Pain    HPI Nathaniel [name] is a 57 y.o. male with a past medical history of deafness, PSVT, hyperlipidemia, GERD, who presents to ED for evaluation of 1 month history of productive cough with greenish/yellow sputum, several episodes of hemoptysis, shortness of breath, intermittent chest pressure when laying down.  He states that he saw his PCP for the symptoms and was given an nebulizer machine.  He was told to use a nebulizer machine twice a day to help with his symptoms.  He is did have mild improvement in his symptoms but states that the nebulizer machine broke yesterday and he began having worsening of his shortness of breath since then.  He denies any wheezing.  He denies any prior MI, DVT or PE, recent surgeries, recent prolonged travel, hormone use.  Patient did have overnight stay for chest pain rule out approximately 16 months ago.  Stress test was performed at that time which showed no evidence of ischemia or infarction.  HPI  Past Medical History:  Diagnosis Date  . Allergy   . Arthritis    right knee  . Asthma    prn inhaler  . Barrett's esophagus 10-25-14 pt denies   states "minor"  . Chest pain    a. 06/2016 MV: EF 52%, no ischemia or infarct.  . 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 Deaf    Call [phone] for patient's sign language interpreter.   Marland Kitchen GERD (gastroesophageal reflux disease)   . Heartburn    occasional, related to certain foods  . High cholesterol   . Intermittent palpitations 10-25-14 patient denies   a. Event Monitor 04/2014: Mostly NSR - intermittent PACs, - 2 brief runs of PAT/PSVT  . PSVT (paroxysmal supraventricular tachycardia) ([place])    a. 04/2014 Event Monitor: brief PAT/PSVT-->uses prn flecainide.  . Seasonal allergies   . Stroke [facility]) 2013   TIA    . TIA (transient ischemic attack) 10-25-14 pt denies   no current deficits  . Vasovagal syncope     Patient Active Problem List   Diagnosis Date Noted  . High cholesterol   . Pain in the chest   . Syncope 05/11/2015  . Chest pain of uncertain etiology 14/43/1540  . Sigmoid polyp 11/03/2014  . Barrett's esophagus 10/19/2014  . OSA (obstructive sleep apnea) 07/15/2014  . SVT (supraventricular tachycardia) ([place]) 06/18/2014  . Insomnia 06/18/2014  . Palpitations 05/19/2014  . Normal coronary arteries- 2006 and June 2012 05/19/2014  . PVC (premature ventricular contraction) 05/19/2014  . Chest pain 05/19/2014  . Dyspnea 05/19/2014  . Unspecified visual disturbance 11/02/2013  . Metatarsalgia of right foot 08/20/2013  . Hyperlipidemia 12/22/2012  . Hearing impaired 12/21/2012  . TIA (transient ischemic attack) 12/20/2012  . CVA (cerebral infarction) 12/20/2012  . History of asthma 12/20/2012  . HEARTBURN 10/17/2009    Past Surgical History:  Procedure Laterality Date  . [place]  08/2015   Ranging from mostly sinus rhythm to occasional sinus bradycardia and sinus tachycardia. Heart rate from 53 bpm to 129 bpm. No documented PVCs or PACs noted. No arrhythmia noted. No PSVT or A. Fib. "Heart racing" noted with sinus tachycardia.  . Abdominal Aortic Ultrasound  2012   normal abdominal aorta  . BUNIONECTOMY Left 2012   right  done also  . CARDIAC CATHETERIZATION  06/24/2011   wwidely patent normal coronary arteries with normal ejection fraction  . Carotid Artery Dopplers  2012   tortuous but no stenoses.. No subclavian disease  . COLONOSCOPY  2015  . COLONOSCOPY WITH PROPOFOL N/A 11/03/2014   Procedure: COLONOSCOPY WITH PROPOFOL;  Surgeon: Milus Banister, MD;  Location: [facility];  Service: Endoscopy;  Laterality: N/A;  . Foot Implant Removal Right 07/15/2013   @ [place]  . INGUINAL HERNIA REPAIR    . KNEE ARTHROSCOPY Left 06/02/2012  . KNEE ARTHROSCOPY Right  fall 2013   x 2  . KNEE ARTHROSCOPY Left 03/03/2013   Procedure: ARTHROSCOPY KNEE WITH CHONDROPLASTY PATELLA  AND LATERAL TIBIAL PLATEAU;  Surgeon: Kerin Salen, MD;  Location: [facility];  Service: Orthopedics;  Laterality: Left;  . NM MYOVIEW LTD  06/2016   No evidence of ischemia or infarction.   Domingo Dimes ECHOCARDIOGRAM  June 2015, August 2018   A) normal EF: 55-60%. No wall motion abnormalities. Gr 1 DD.  Mild MR. Otherwise normal;; b) ([facility]) - Normal LV size and function.  EF 60-65%.  No significant valvular abnormalities.  Marland Kitchen Upper and Lower Extremity Arterial Dopplers  2012   normal arterial flow bilateral upper extremities including subclavian is a carotid;;R. ABI 1.2, L. ABI 1.28. Normal flow velocities. Likely calcified vessels.  Marland Kitchen UPPER GASTROINTESTINAL ENDOSCOPY  2013       Home Medications    Prior to Admission medications   Medication Sig Start Date End Date Taking? Authorizing Provider  albuterol (PROVENTIL) (2.5 MG/3ML) 0.083% nebulizer solution Take 3 mLs (2.5 mg total) by nebulization every 6 (six) hours as needed for wheezing or shortness of breath. 02/22/18   Delia Heady, PA-C  aspirin EC 81 MG EC tablet Take 1 tablet (81 mg total) by mouth daily. 05/05/17   Alphonzo Grieve, MD  benzonatate (TESSALON) 100 MG capsule Take 2 capsules (200 mg total) by mouth every 8 (eight) hours. 02/22/18   Zamani Crocker, PA-C  esomeprazole (NEXIUM) 40 MG capsule TAKE 1 CAPSULE (40 MG TOTAL) BY MOUTH DAILY AT 12 NOON. 02/20/18   Milus Banister, MD  flecainide [facility]) 100 MG tablet Take 1 tablet (100 mg total) 2 (two) times daily by mouth. 11/14/17   Leonie Man, MD  Fluticasone-Salmeterol (ADVAIR) 250-50 MCG/DOSE AEPB Inhale 1 puff into the lungs daily. 03/06/16   Gareth Morgan, MD  guaiFENesin-codeine (ROBITUSSIN AC) 100-10 MG/5ML syrup Take 5 mLs by mouth 3 (three) times daily as needed for cough. 02/22/18   Zayvien Canning, PA-C  metoprolol tartrate (LOPRESSOR)  25 MG tablet Take 1 tablet (25 mg total) by mouth 2 (two) times daily. as needed 12/01/17 03/01/18  Leonie Man, MD  omeprazole (PRILOSEC) 40 MG capsule Take 1 capsule (40 mg total) by mouth daily. Take 30 minutes before first meal of the day. 05/04/17   Alphonzo Grieve, MD    Family History Family History  Problem Relation Age of Onset  . Ovarian cancer Mother   . Coronary artery disease Father   . Heart attack Father 55  . Heart attack Brother 37  . Heart attack Paternal Grandfather 48  . Colon cancer Neg Hx   . Stomach cancer Neg Hx   . Esophageal cancer Neg Hx   . Rectal cancer Neg Hx     Social History Social History   Tobacco Use  . Smoking status: Never Smoker  . Smokeless tobacco: Never Used  Substance  Use Topics  . Alcohol use: Yes    Alcohol/week: 0.0 oz    Comment: occ  . Drug use: No     Allergies   Prednisone; Shellfish-derived products; Shrimp [shellfish allergy]; Simvastatin; Tamsulosin; Adhesive [tape]; Simvastatin - high dose  [simvastatin-high dose]; Fish allergy; Fish-derived products; and Isovue [iopamidol]   Review of Systems Review of Systems  Constitutional: Negative for appetite change, chills and fever.  HENT: Negative for ear pain, rhinorrhea, sneezing and sore throat.   Eyes: Negative for photophobia and visual disturbance.  Respiratory: Positive for cough and shortness of breath. Negative for chest tightness and wheezing.   Cardiovascular: Positive for chest pain. Negative for palpitations.  Gastrointestinal: Negative for abdominal pain, blood in stool, constipation, diarrhea, nausea and vomiting.  Genitourinary: Negative for dysuria, hematuria and urgency.  Musculoskeletal: Negative for myalgias.  Skin: Negative for rash.  Neurological: Negative for dizziness, weakness and light-headedness.     Physical Exam Updated Vital Signs BP 135/89   Pulse 88   Temp 97.8 F (36.6 C) (Oral)   Resp 18   SpO2 97%   Physical Exam    Constitutional: He appears well-developed and well-nourished. No distress.  Nontoxic appearing and in no acute distress. Pleasant. Appears older than stated age.  HENT:  Head: Normocephalic and atraumatic.  Nose: Nose normal.  Eyes: Conjunctivae and EOM are normal. Left eye exhibits no discharge. No scleral icterus.  Neck: Normal range of motion. Neck supple.  Cardiovascular: Regular rhythm, normal heart sounds and intact distal pulses. Tachycardia present. Exam reveals no gallop and no friction rub.  No murmur heard. Pulmonary/Chest: Effort normal. No accessory muscle usage. No tachypnea. No respiratory distress. He has rales in the right middle field.  Abdominal: Soft. Bowel sounds are normal. He exhibits no distension. There is no tenderness. There is no guarding.  Musculoskeletal: Normal range of motion. He exhibits no edema.  Neurological: He is alert. He exhibits normal muscle tone. Coordination normal.  Skin: Skin is warm and dry. No rash noted.  Psychiatric: He has a normal mood and affect.  Nursing note and vitals reviewed.    ED Treatments / Results  Labs (all labs ordered are listed, but only abnormal results are displayed) Labs Reviewed  BASIC METABOLIC PANEL - Abnormal; Notable for the following components:      Result Value   CO2 21 (*)    Glucose, Bld 177 (*)    Calcium 8.7 (*)    All other components within normal limits  CBC  TROPONIN I  D-DIMER, QUANTITATIVE (NOT AT [facility])  TROPONIN I    EKG  EKG Interpretation  Date/Time:  Sunday February 22 2018 12:38:00 EST Ventricular Rate:  105 PR Interval:  150 QRS Duration: 92 QT Interval:  354 QTC Calculation: 467 R Axis:   16 Text Interpretation:  Sinus tachycardia Minimal voltage criteria for LVH, may be normal variant Borderline ECG no ssig change from previous Confirmed by Charlesetta Shanks [phone]) on 02/22/2018 2:46:36 PM       Radiology Dg Chest 2 View  Result Date: 02/22/2018 CLINICAL DATA:  Chest  pain and short of breath for 2 days EXAM: CHEST  2 VIEW COMPARISON:  01/18/2018 FINDINGS: Normal heart size. Lungs under aerated and clear. No pneumothorax. No pleural effusion. Stable upper thoracic compression deformities. Osteopenia. IMPRESSION: No active cardiopulmonary disease. Electronically Signed   By: Marybelle Killings M.D.   On: 02/22/2018 13:40   Ct Chest W Contrast  Result Date: 02/22/2018 CLINICAL DATA:  Dyspnea  EXAM: CT CHEST WITH CONTRAST TECHNIQUE: Multidetector CT imaging of the chest was performed during intravenous contrast administration. CONTRAST:  60mL ISOVUE-300 IOPAMIDOL (ISOVUE-300) INJECTION 61% COMPARISON:  Chest radiograph 02/22/2018 FINDINGS: Cardiovascular: Normal heart size. Normal 3 vessel aortic arch branching pattern. No focal aortic abnormality. Mediastinum/Nodes: There is a small hiatal hernia. Visualized thyroid is normal. No mediastinal or hilar lymphadenopathy. Lungs/Pleura: Lungs are clear. No pleural effusion or pneumothorax. Airways are patent without abnormal bronchial wall thickening. No pulmonary nodules masses. Multiple small areas focal atelectasis, including anterior right upper lobe and the dependent segments of both lower lobes. Upper Abdomen: Normal appearance of the visualized upper abdominal organs Musculoskeletal: Chronic compression deformity of T6. IMPRESSION: No acute thoracic abnormality. No finding to explain the reported dyspnea. Mild multifocal nonspecific subsegmental atelectasis. Electronically Signed   By: Ulyses Jarred M.D.   On: 02/22/2018 16:32    Procedures Procedures (including critical care time)  Medications Ordered in ED Medications  methylPREDNISolone sodium succinate (SOLU-MEDROL) 125 mg/2 mL injection (not administered)  ipratropium-albuterol (DUONEB) 0.5-2.5 (3) MG/3ML nebulizer solution 3 mL (3 mLs Nebulization Given 02/22/18 1402)  iopamidol (ISOVUE-300) 61 % injection 100 mL (80 mLs Intravenous Contrast Given 02/22/18 1528)    diphenhydrAMINE (BENADRYL) 50 MG/ML injection (  Given 02/22/18 1535)     Initial Impression / Assessment and Plan / ED Course  I have reviewed the triage vital signs and the nursing notes.  Pertinent labs & imaging results that were available during my care of the patient were reviewed by me and considered in my medical decision making (see chart for details).     Patient presents to ED for evaluation of 1 month history of productive cough with yellow sputum, several episodes of hemoptysis, shortness of breath, intermittent chest pressure.  Saw his PCP for symptoms and was given nebulizer machine.  He reports some improvement in his symptoms with that but the machine broke yesterday.  He experience worsening of his symptoms after that.  Denies any wheezing, prior MI, DVT or PE, recent surgeries, recent prolonged travel.  On physical exam patient is overall well-appearing.  He was satting at 97-98% on room air.  He was mildly tachycardic to low 100s on initial evaluation.  He was afebrile with no history of fever no use of antipyretics.  He is not tachypneic and has no increased work of breathing noted.  EKG with no significant changes from previous tracings and no ischemic changes.  Chest x-ray was unremarkable.  Initial troponin, CBC, BMP were all unremarkable.  Patient was taken for a chest CT due to his chronicity of symptoms and discomfort.  While he was in the CT machine, after IV dye was administered he began complaining that his chest was getting tight.  At no time did he have any signs of angioedema or anaphylaxis.  He continued to sat at 99% on room air there is no visible signs of reaction noted.  However, patient states that he does not want any contrast studies in the future.  He was given Benadryl with improvement in his symptoms.  I believe part of this could be due to his anxiety as well.  CT of the chest with no acute abnormalities to explain the patient's symptoms.  Delta troponin  negative.  Will give patient antitussives to help with cough, will give another prescription for nebulizer machine.  Upon further chart review, it appears that patient is followed by his PCP for the symptoms.  He was recently on Augmentin for  acute bronchitis.  He does have an appointment with his cardiologist tomorrow and I encouraged him to follow-up with his cardiologist and primary care provider for further evaluation.  Patient is allergic to prednisone so cannot give steroids for what appears to be a viral URI.  Patient appears stable for discharge at this time.  Strict return precautions given.  Portions of this note were generated with [employer]. Dictation errors may occur despite best attempts at proofreading.   Final Clinical Impressions(s) / ED Diagnoses   Final diagnoses:  Viral URI with cough    ED Discharge Orders        Ordered    benzonatate (TESSALON) 100 MG capsule  Every 8 hours     02/22/18 1656    guaiFENesin-codeine (ROBITUSSIN AC) 100-10 MG/5ML syrup  3 times daily PRN     02/22/18 1656    DME Nebulizer machine     02/22/18 1656    albuterol (PROVENTIL) (2.5 MG/3ML) 0.083% nebulizer solution  Every 6 hours PRN     02/22/18 1656       Delia Heady, PA-C 02/22/18 1657    Charlesetta Shanks, MD 03/03/18 [phone]

## 2018-02-22 NOTE — ED Notes (Signed)
Xray delay, RN starting IV

## 2018-02-22 NOTE — ED Notes (Signed)
Using interpreter, pt given d/c instructions as per chart. Rx x 3. Verbalized understanding. No questions.

## 2018-02-22 NOTE — ED Notes (Signed)
Patient transported to X-ray 

## 2018-02-22 NOTE — ED Triage Notes (Signed)
Pt is deaf. Used sign language interpreter. C/o chest pain and SOB x 2 days. States his neb machine stopped working.

## 2018-02-22 NOTE — ED Notes (Signed)
Using sign interpreter , at bedside

## 2018-02-22 NOTE — Discharge Instructions (Addendum)
Please read attached information regarding your condition. Continue your home nebulizer treatments as directed. Take Tessalon perles to help with cough. You can take the Tussionex for cough as well, but it will make you drowsy. Follow up with your primary care provider and cardiologist for further evaluation. Return to ED for worsening symptoms, increasing chest pain, coughing up blood, leg swelling, trouble swallowing or trouble breathing. Hoping that you feel better soon!

## 2018-02-22 NOTE — ED Notes (Addendum)
Called to Radiology for possible adverse reaction to IV Contrast - Sign Language Interpreter in use - Patient reports "throat feels funny." Hina, PA at bedside. Emergent Verbal Order for Benadryl 25mg  given via IV. Vital signs stable. Airway patent. NO angioedema.

## 2018-02-23 ENCOUNTER — Ambulatory Visit (INDEPENDENT_AMBULATORY_CARE_PROVIDER_SITE_OTHER): Payer: Medicare Other | Admitting: Cardiology

## 2018-02-23 ENCOUNTER — Other Ambulatory Visit: Payer: Self-pay | Admitting: Cardiology

## 2018-02-23 ENCOUNTER — Encounter: Payer: Self-pay | Admitting: Cardiology

## 2018-02-23 VITALS — BP 130/94 | HR 96 | Ht 71.0 in | Wt 185.0 lb

## 2018-02-23 DIAGNOSIS — I493 Ventricular premature depolarization: Secondary | ICD-10-CM

## 2018-02-23 DIAGNOSIS — R55 Syncope and collapse: Secondary | ICD-10-CM | POA: Diagnosis not present

## 2018-02-23 DIAGNOSIS — I471 Supraventricular tachycardia: Secondary | ICD-10-CM

## 2018-02-23 NOTE — Progress Notes (Signed)
Electrophysiology Office Note   Date:  02/23/2018   ID:  Nathaniel [name], DOB Jul 18, 1961, MRN [id number]  PCP:  Patient, No Pcp Per  Cardiologist:  Ellyn Hack Primary Electrophysiologist:  Tavita Eastham Meredith Leeds, MD    Chief Complaint  Patient presents with  . Follow-up    SVT/PVC's     History of Present Illness: Nathaniel [name] is a 57 y.o. male who is being seen today for the evaluation of syncope at the request of No ref. provider found. Presenting today for electrophysiology evaluation.  He has a history of hyperlipidemia, PSVT on as needed flecainide, CVA.  He is also deaf and uses a sign language interpreter.  He was started on flecainide in 2017.  He was admitted to the hospital in August 2018 and was noted to have SVT.  Flecainide was increased to 100 mg twice a day at that time.  He presented to the emergency room August 31 due to palpitations.  He took an extra flecainide with no benefit.  He went to the emergency room for a second event in 2 days.  Today, denies symptoms of palpitations, chest pain, shortness of breath, orthopnea, PND, lower extremity edema, claudication, dizziness, presyncope, syncope, bleeding, or neurologic sequela. The patient is tolerating medications without difficulties.  He is overall been feeling well.  He did present to the emergency room yesterday with a cough and was found to have a viral URI.  A few weeks ago, he had 2 episodes where according to his wife, he stopped breathing.  He was watching TV at the time and felt very tired.  His wife apparently did chest compressions and he is improved significantly.  This is not happened since that time.     Past Medical History:  Diagnosis Date  . Allergy   . Arthritis    right knee  . Asthma    prn inhaler  . Barrett's esophagus 10-25-14 pt denies   states "minor"  . Chest pain    a. 06/2016 MV: EF 52%, no ischemia or infarct.  . 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 Deaf    Call [phone] for  patient's sign language interpreter.   Marland Kitchen GERD (gastroesophageal reflux disease)   . Heartburn    occasional, related to certain foods  . High cholesterol   . Intermittent palpitations 10-25-14 patient denies   a. Event Monitor 04/2014: Mostly NSR - intermittent PACs, - 2 brief runs of PAT/PSVT  . PSVT (paroxysmal supraventricular tachycardia) ([place])    a. 04/2014 Event Monitor: brief PAT/PSVT-->uses prn flecainide.  . Seasonal allergies   . Stroke [facility]) 2013   TIA  . TIA (transient ischemic attack) 10-25-14 pt denies   no current deficits  . Vasovagal syncope    Past Surgical History:  Procedure Laterality Date  . [place]  08/2015   Ranging from mostly sinus rhythm to occasional sinus bradycardia and sinus tachycardia. Heart rate from 53 bpm to 129 bpm. No documented PVCs or PACs noted. No arrhythmia noted. No PSVT or A. Fib. "Heart racing" noted with sinus tachycardia.  . Abdominal Aortic Ultrasound  2012   normal abdominal aorta  . BUNIONECTOMY Left 2012   right done also  . CARDIAC CATHETERIZATION  06/24/2011   wwidely patent normal coronary arteries with normal ejection fraction  . Carotid Artery Dopplers  2012   tortuous but no stenoses.. No subclavian disease  . COLONOSCOPY  2015  . COLONOSCOPY WITH PROPOFOL N/A 11/03/2014  Procedure: COLONOSCOPY WITH PROPOFOL;  Surgeon: Milus Banister, MD;  Location: [facility];  Service: Endoscopy;  Laterality: N/A;  . Foot Implant Removal Right 07/15/2013   @ [place]  . INGUINAL HERNIA REPAIR    . KNEE ARTHROSCOPY Left 06/02/2012  . KNEE ARTHROSCOPY Right fall 2013   x 2  . KNEE ARTHROSCOPY Left 03/03/2013   Procedure: ARTHROSCOPY KNEE WITH CHONDROPLASTY PATELLA  AND LATERAL TIBIAL PLATEAU;  Surgeon: Kerin Salen, MD;  Location: [place];  Service: Orthopedics;  Laterality: Left;  . NM MYOVIEW LTD  06/2016   No evidence of ischemia or infarction.   Dom[place] Dimes ECHOCARDIOGRAM  June 2015, August 2018    A) normal EF: 55-60%. No wall motion abnormalities. Gr 1 DD.  Mild MR. Otherwise normal;; b) ([facility]) - Normal LV size and function.  EF 60-65%.  No significant valvular abnormalities.  Marland Kitchen Upper and Lower Extremity Arterial Dopplers  2012   normal arterial flow bilateral upper extremities including subclavian is a carotid;;R. ABI 1.2, L. ABI 1.28. Normal flow velocities. Likely calcified vessels.  Marland Kitchen UPPER GASTROINTESTINAL ENDOSCOPY  2013     Current Outpatient Medications  Medication Sig Dispense Refill  . albuterol (PROVENTIL) (2.5 MG/3ML) 0.083% nebulizer solution Take 3 mLs (2.5 mg total) by nebulization every 6 (six) hours as needed for wheezing or shortness of breath. 75 mL 12  . aspirin EC 81 MG EC tablet Take 1 tablet (81 mg total) by mouth daily. 30 tablet 2  . benzonatate (TESSALON) 100 MG capsule Take 2 capsules (200 mg total) by mouth every 8 (eight) hours. 30 capsule 0  . esomeprazole (NEXIUM) 40 MG capsule TAKE 1 CAPSULE (40 MG TOTAL) BY MOUTH DAILY AT 12 NOON. 30 capsule 9  . flecainide (TAMBOCOR) 100 MG tablet Take 1 tablet (100 mg total) 2 (two) times daily by mouth. 180 tablet 3  . Fluticasone-Salmeterol (ADVAIR) 250-50 MCG/DOSE AEPB Inhale 1 puff into the lungs daily. 60 each 0  . guaiFENesin-codeine (ROBITUSSIN AC) 100-10 MG/5ML syrup Take 5 mLs by mouth 3 (three) times daily as needed for cough. 40 mL 0  . metoprolol tartrate (LOPRESSOR) 25 MG tablet Take 1 tablet (25 mg total) by mouth 2 (two) times daily. as needed 60 tablet 11  . omeprazole (PRILOSEC) 40 MG capsule Take 1 capsule (40 mg total) by mouth daily. Take 30 minutes before first meal of the day. 30 capsule 2   No current facility-administered medications for this visit.     Allergies:   Prednisone; Shellfish-derived products; Shrimp [shellfish allergy]; Simvastatin; Tamsulosin; Adhesive [tape]; Simvastatin - high dose  [simvastatin-high dose]; Fish allergy; Fish-derived products; and Isovue [iopamidol]   Social  History:  The patient  reports that  has never smoked. he has never used smokeless tobacco. He reports that he drinks alcohol. He reports that he does not use drugs.   Family History:  The patient's family history includes Coronary artery disease in his father; Heart attack (age of onset: 27) in his brother; Heart attack (age of onset: 62) in his paternal grandfather; Heart attack (age of onset: 32) in his father; Ovarian cancer in his mother.    ROS:  Please see the history of present illness.   Otherwise, review of systems is positive for none.   All other systems are reviewed and negative.   PHYSICAL EXAM: VS:  BP (!) 130/94   Pulse 96   Ht 5\' 11"  (1.803 m)   Wt 185 lb (83.9 kg)  BMI 25.80 kg/m  , BMI Body mass index is 25.8 kg/m. GEN: Well nourished, well developed, in no acute distress  HEENT: normal  Neck: no JVD, carotid bruits, or masses Cardiac: RRR; no murmurs, rubs, or gallops,no edema  Respiratory:  clear to auscultation bilaterally, normal work of breathing GI: soft, nontender, nondistended, + BS MS: no deformity or atrophy  Skin: warm and dry Neuro:  Strength and sensation are intact Psych: euthymic mood, full affect  EKG:  EKG is not ordered today. Personal review of the ekg ordered 02/22/17 shows sinus tachycardia, rate 105, voltage criteria for LVH   Recent Labs: 05/03/2017: Magnesium 2.2 02/22/2018: BUN 12; Creatinine, Ser 0.94; Hemoglobin 13.6; Platelets 226; Potassium 3.7; Sodium 138    Lipid Panel     Component Value Date/Time   CHOL 166 07/10/2016 0158   TRIG 180 (H) 07/10/2016 0158   HDL 26 (L) 07/10/2016 0158   CHOLHDL 6.4 07/10/2016 0158   VLDL 36 07/10/2016 0158   LDLCALC 104 (H) 07/10/2016 0158     Wt Readings from Last 3 Encounters:  02/23/18 185 lb (83.9 kg)  01/18/18 181 lb (82.1 kg)  12/01/17 180 lb 6.4 oz (81.8 kg)      Other studies Reviewed: Additional studies/ records that were reviewed today include: TTE 2015  Review of the above  records today demonstrates:  - Left ventricle: The cavity size was normal. Systolic function was normal. The estimated ejection fraction was in the range of 55% to 60%. Wall motion was normal; there were no regional wall motion abnormalities. Doppler parameters are consistent with abnormal left ventricular relaxation (grade 1 diastolic dysfunction). There was no evidence of elevated ventricular filling pressure by Doppler parameters. - Aortic valve: Trileaflet; normal thickness leaflets. There was no regurgitation. - Aortic root: The aortic root was normal in size. - Mitral valve: Structurally normal valve. There was mild regurgitation. - Left atrium: The atrium was mildly dilated. - Right ventricle: Systolic function was normal. - Right atrium: The atrium was normal in size. - Tricuspid valve: There was no regurgitation. - Pulmonic valve: There was trivial regurgitation. - Pulmonary arteries: Systolic pressure was within the normal range. - Inferior vena cava: The vessel was normal in size. The respirophasic diameter changes were in the normal range (= 50%), consistent with normal central venous pressure.  30 day monitor 10/08/17 - personally reviewed  Mostly normal sinus rhythm with occasional sinus tachycardia and sinus arrhythmia.  Symptoms (chest pain and tightness) were noted with sinus rhythm, sinus tachycardia, sinus arrhythmia.  Very rare PVCs noted.  1 short run of 8-10 beats wide-complex tachycardia noted a symptomatic, but not long enough to cause any significant fin symptoms dings.  ASSESSMENT AND PLAN:  1.  PVCs: Controlled on metoprolol and flecainide.  No changes.  2.  SVT: He is on metoprolol and flecainide.  Had a lot of fatigue, and try to switch him to diltiazem which he did not tolerate.  We Catrice Zuleta plan to continue these medications.  3.  Syncope: Wore a 30-day monitor which showed sinus tachycardia but no further issues.  He continues  to have episodes of tachycardia but does not wish to wear further monitoring.  We Saryn Cherry plan for link monitor at this time to monitor for syncope.  With a subsequent episode of syncope, link monitor implantation would be reasonable.   Current medicines are reviewed at length with the patient today.   The patient does not have concerns regarding his medicines.  The following  changes were made today: none  Labs/ tests ordered today include:  No orders of the defined types were placed in this encounter.    Disposition:   FU with Elmon Shader 12 months  Signed, Ariz Terrones Meredith Leeds, MD  02/23/2018 11:30 AM     [facility] [address] [place] [address] [phone] (office) [phone] (fax)

## 2018-02-23 NOTE — Patient Instructions (Addendum)
Medication Instructions:  Your physician recommends that you continue on your current medications as directed. Please refer to the Current Medication list given to you today.  * If you need a refill on your cardiac medications before your next appointment, please call your pharmacy. *  Labwork: None ordered  Testing/Procedures: Your physician has recommended that you have a loop recorder (LINQ) implanted.  Please report to the [facility] Entr[facility] of [facility] on 02/26/2018 @ 1:00 p.m.   You may have a light breakfast the morning of the procedure  You may take all of your medications the morning of the procedure  Wash chest & neck area with the antibacterial soap/surgical scrub the night   before and the morning of the procedure   Follow-Up: Your physician recommends that you schedule a wound check appointment in: 7 -10 days, after your procedure on 02/26/2018, with device clinic.  Your physician wants you to follow-up in: 1 year with Dr. Curt Bears.  You will receive a reminder letter in the mail two months in advance. If you don't receive a letter, please call our office to schedule the follow-up appointment.  *Please note that any paperwork needing to be filled out by the provider will need to be addressed at the front desk prior to seeing the provider.  Please note that any FMLA, disability or other documents regarding health condition is subject to a $25.00 charge that must be received prior to completion of paperwork in the form of a money order or check.*  Thank you for choosing [facility]!!   Trinidad Curet, RN [phone]    Any Other Special Instructions Will Be Listed Below (If Applicable).   Implantable Loop Recorder Placement An implantable loop recorder is a small electronic device that is placed under the skin of your chest. It is about the size of an AA ("double A") battery. The device records the electrical activity of your heart over a long  period of time. Your health care provider can download these recordings to monitor your heart. You may need an implantable loop recorder if you have periods of abnormal heart activity (arrhythmias) or unexplained fainting (syncope) caused by a heart problem. Tell a health care provider about:  Any allergies you have.  All medicines you are taking, including vitamins, herbs, eye drops, creams, and over-the-counter medicines.  Any problems you or family members have had with anesthetic medicines.  Any blood disorders you have.  Any surgeries you have had.  Any medical conditions you have.  Whether you are pregnant or may be pregnant. What are the risks? Generally, this is a safe procedure. However, as with any procedure, problems may occur, including:  Infection.  Bleeding.  Allergic reactions to anesthetic medicines.  Damage to nerves or blood vessels.  Failure of the device to work. This could require another surgery to replace it.  What happens before the procedure?   You may have a physical exam, blood tests, and imaging tests of your heart, such as a chest X-ray.  Follow instructions from your health care provider about eating or drinking restrictions.  Ask your health care provider about: ? Changing or stopping your regular medicines. This is especially important if you are taking diabetes medicines or blood thinners. ? Taking medicines such as aspirin and ibuprofen. These medicines can thin your blood. Do not take these medicines before your procedure if your surgeon instructs you not to.  Ask your health care provider how your  surgical site will be marked or identified.  You may be given antibiotic medicine to help prevent infection.  Plan to have someone take you home after the procedure.  If you will be going home right after the procedure, plan to have someone with you for 24 hours.  Do not use any tobacco products, such as cigarettes, chewing tobacco, and  e-cigarettes as told by your surgeon. If you need help quitting, ask your health care provider. What happens during the procedure?  To reduce your risk of infection: ? Your health care team will wash or sanitize their hands. ? Your skin will be washed with soap.  An IV tube will be inserted into one of your veins.  You may be given an antibiotic medicine through the IV tube.  You may be given one or more of the following: ? A medicine to help you relax (sedative). ? A medicine to numb the area (local anesthetic).  A small cut (incision) will be made on the left side of your upper chest.  A pocket will be created under your skin.  The device will be placed in the pocket.  The incision will be closed with stitches (sutures) or adhesive strips.  A bandage (dressing) will be placed over the incision. The procedure may vary among health care providers and hospitals. What happens after the procedure?  Your blood pressure, heart rate, breathing rate, and blood oxygen level will be monitored often until the medicines you were given have worn off.  You may be able to go home on the day of your surgery. Before going home: ? Your health care provider will program your recorder. ? You will learn how to trigger your device with a handheld activator. ? You will learn how to send recordings to your health care provider. ? You will get an ID card for your device, and you will be told when to use it.  Do not drive for 24 hours if you received a sedative. This information is not intended to replace advice given to you by your health care provider. Make sure you discuss any questions you have with your health care provider. Document Released: 11/27/2015 Document Revised: 05/23/2016 Document Reviewed: 09/20/2015 Elsevier Interactive Patient Education  2018 [place] Placement, Care After Refer to this sheet in the next few weeks. These instructions provide you  with information about caring for yourself after your procedure. Your health care provider may also give you more specific instructions. Your treatment has been planned according to current medical practices, but problems sometimes occur. Call your health care provider if you have any problems or questions after your procedure. What can I expect after the procedure? After the procedure, it is common to have:  Soreness or pain near the cut from surgery (incision).  Some swelling or bruising near the incision.  Follow these instructions at home: Medicines  Take over-the-counter and prescription medicines only as told by your health care provider.  If you were prescribed an antibiotic medicine, take it as told by your health care provider. Do not stop taking the antibiotic even if you start to feel better. Bathing  Do not take baths, swim, or use a hot tub until your health care provider approves. Ask your health care provider if you can take showers. You may only be allowed to take sponge baths for bathing. Incision care  Follow instructions from your health care provider about how to take care of your incision.  Make sure you: ? Wash your hands with soap and water before you change your bandage (dressing). If soap and water are not available, use hand sanitizer. ? Change your dressing as told by your health care provider. ? Keep your dressing dry. ? Leave stitches (sutures), skin glue, or adhesive strips in place. These skin closures may need to stay in place for 2 weeks or longer. If adhesive strip edges start to loosen and curl up, you may trim the loose edges. Do not remove adhesive strips completely unless your health care provider tells you to do that.  Check your incision area every day for signs of infection. Check for: ? More redness, swelling, or pain. ? Fluid or blood. ? Warmth. ? Pus or a bad smell. Driving  If you received a sedative, do not drive for 24 hours after the  procedure.  If you did not receive a sedative, ask your health care provider when it is safe to drive. Activity  Return to your normal activities as told by your health care provider. Ask your health care provider what activities are safe for you.  Until your health care provider says it is safe: ? Do not lift anything that is heavier than 10 lb (4.5 kg). ? Do not do activities that involve lifting your arms over your head. General instructions   Follow instructions from your health care provider about how and when to use your implantable loop recorder.  Do not go through a metal detection gate, and do not let someone hold a metal detector over your chest. Show your ID card.  Do not have an MRI unless you check with your health care provider first.  Do not use any tobacco products, such as cigarettes, chewing tobacco, and e-cigarettes. Tobacco can delay healing. If you need help quitting, ask your health care provider.  Keep all follow-up visits as told by your health care provider. This is important. Contact a health care provider if:  You have more redness, swelling, or pain around your incision.  You have more fluid or blood coming from your incision.  Your incision feels warm to the touch.  You have pus or a bad smell coming from your incision.  You have a fever.  You have pain that is not relieved by your pain medicine.  You have triggered your device because of fainting (syncope) or because of a heartbeat that feels like it is racing, slow, fluttering, or skipping (palpitations). Get help right away if:  You have chest pain.  You have difficulty breathing. This information is not intended to replace advice given to you by your health care provider. Make sure you discuss any questions you have with your health care provider. Document Released: 11/27/2015 Document Revised: 05/23/2016 Document Reviewed: 09/20/2015 Elsevier Interactive Patient Education  [employer].

## 2018-02-26 ENCOUNTER — Encounter (HOSPITAL_COMMUNITY): Payer: Self-pay | Admitting: Cardiology

## 2018-02-26 ENCOUNTER — Ambulatory Visit (HOSPITAL_COMMUNITY)
Admission: RE | Admit: 2018-02-26 | Discharge: 2018-02-26 | Disposition: A | Discharge status: Home / Self Care | Payer: Medicare Other | Source: Ambulatory Visit | Attending: Cardiology | Admitting: Cardiology

## 2018-02-26 ENCOUNTER — Encounter (HOSPITAL_COMMUNITY)
Admission: RE | Disposition: A | Discharge status: Home / Self Care | Payer: Self-pay | Source: Ambulatory Visit | Attending: Cardiology

## 2018-02-26 DIAGNOSIS — Z7982 Long term (current) use of aspirin: Secondary | ICD-10-CM | POA: Insufficient documentation

## 2018-02-26 DIAGNOSIS — Z8249 Family history of ischemic heart disease and other diseases of the circulatory system: Secondary | ICD-10-CM | POA: Diagnosis not present

## 2018-02-26 DIAGNOSIS — R55 Syncope and collapse: Secondary | ICD-10-CM | POA: Insufficient documentation

## 2018-02-26 DIAGNOSIS — Z79899 Other long term (current) drug therapy: Secondary | ICD-10-CM | POA: Insufficient documentation

## 2018-02-26 DIAGNOSIS — Z91013 Allergy to seafood: Secondary | ICD-10-CM | POA: Insufficient documentation

## 2018-02-26 DIAGNOSIS — Z8673 Personal history of transient ischemic attack (TIA), and cerebral infarction without residual deficits: Secondary | ICD-10-CM | POA: Insufficient documentation

## 2018-02-26 HISTORY — PX: LOOP RECORDER INSERTION: EP1214

## 2018-02-26 SURGERY — LOOP RECORDER INSERTION
Anesthesia: LOCAL

## 2018-02-26 MED ORDER — LIDOCAINE-EPINEPHRINE 1 %-1:100000 IJ SOLN
INTRAMUSCULAR | Status: DC | PRN
  Administered 2018-02-26: 30 mL

## 2018-02-26 MED ORDER — LIDOCAINE-EPINEPHRINE 1 %-1:100000 IJ SOLN
INTRAMUSCULAR | Status: AC
  Filled 2018-02-26: qty 1

## 2018-02-26 SURGICAL SUPPLY — 2 items
LOOP REVEAL LINQSYS (Prosthesis & Implant Heart) ×1 IMPLANT
PACK LOOP INSERTION (CUSTOM PROCEDURE TRAY) ×2 IMPLANT

## 2018-02-26 NOTE — Discharge Instructions (Signed)
LOOP RECORDER INSERTION INSTRUCTION SHEET GIVEN.

## 2018-02-26 NOTE — H&P (Signed)
Nathaniel [name] has presented today for surgery, with the diagnosis of syncope.  The various methods of treatment have been discussed with the patient and family. After consideration of risks, benefits and other options for treatment, the patient has consented to  Procedure(s): LINQ implant as a surgical intervention .  Risks include but not limited to bleeding, tamponade, infection, pneumothorax, among others. The patient's history has been reviewed, patient examined, no change in status, stable for surgery.  I have reviewed the patient's chart and labs.  Questions were answered to the patient's satisfaction.    Ronel Rodeheaver Curt Bears, MD 02/26/2018 1:09 PM

## 2018-03-05 ENCOUNTER — Telehealth: Payer: Self-pay | Admitting: *Deleted

## 2018-03-05 NOTE — Telephone Encounter (Signed)
LVMOM regarding sending manual transmission. [facility] phone number to call back.

## 2018-03-09 NOTE — Telephone Encounter (Signed)
Spoke with patients brother per DPR who stated that he had not been in touch with patient recently and was unaware of the ILR implant. He states that he will attempt to contact patient and have him send a remote transmission. Device clinic number given.

## 2018-03-09 NOTE — Telephone Encounter (Signed)
LVM requesting remote transmission

## 2018-03-12 ENCOUNTER — Ambulatory Visit (INDEPENDENT_AMBULATORY_CARE_PROVIDER_SITE_OTHER): Payer: Self-pay | Admitting: *Deleted

## 2018-03-12 DIAGNOSIS — R55 Syncope and collapse: Secondary | ICD-10-CM

## 2018-03-12 NOTE — Telephone Encounter (Signed)
Manual transmission received 03/12/18.  All "symptom" episode ECGs show SR/ST.  Wound check is scheduled for today at 11:30am.

## 2018-03-13 LAB — CUP PACEART INCLINIC DEVICE CHECK
Implantable Pulse Generator Implant Date: 20190228
MDC IDC SESS DTM: 20190315102055

## 2018-03-13 NOTE — Progress Notes (Signed)
Wound Loop check in clinic. Pt comes in with sign language interpreter, pt is deaf.  Steri-Strips removed prior to OV, incision well healed, edges approximated, no redness or edema. Battery status: Good. R-waves 0.30 mV. 10 symptom episodes~ ECGs all appear SR. Pt stated that on March the 8th he had low stamina felt his hear skip a beat and also passed out ECG from symptom episode SR.  Other Symptom episodes are a result of error or pt just felt tired with low stamina.  0 tachy episodes, 0 pause episodes, 0 brady episodes. 0 AF episodes. Pt counseled extensively through interpreter about when is appropriate to hit the symptom activator such as when pt feels dizzy or passes out. Also informed pt that if he hits the symptom activator more than once a day he needs to send a manual transmission because the device will erase episodes because it only has so much storage space, instructed pt on how to send a manual transmission. Pt voiced understanding of this. Also informed pt that the LINQ is just a diagnostic tool to help rule out causes of syncope and that syncope could be caused by a number of different things, for example a drop in BP. Also help pt set up my chart as pt does not a VP home phone to communicate with staff, informed pt that he needed to send a message through [place] with his symptoms and what he was doing at the time he pressed the symptom activator, as this seems to be the only to communicate with pt. at current time. Pt voiced understanding of all this . Remote 03/31/2018 ROV w/ WC PRN

## 2018-03-31 ENCOUNTER — Ambulatory Visit (INDEPENDENT_AMBULATORY_CARE_PROVIDER_SITE_OTHER): Payer: Medicare Other | Admitting: *Deleted

## 2018-03-31 DIAGNOSIS — R55 Syncope and collapse: Secondary | ICD-10-CM | POA: Diagnosis not present

## 2018-04-01 NOTE — Progress Notes (Signed)
[facility] Summary Report / Loop Recorder 

## 2018-04-07 ENCOUNTER — Other Ambulatory Visit: Payer: Self-pay | Admitting: Cardiology

## 2018-04-13 ENCOUNTER — Other Ambulatory Visit: Payer: Self-pay | Admitting: Orthopedic Surgery

## 2018-04-13 DIAGNOSIS — M25512 Pain in left shoulder: Secondary | ICD-10-CM

## 2018-04-22 ENCOUNTER — Ambulatory Visit
Admission: RE | Admit: 2018-04-22 | Discharge: 2018-04-22 | Disposition: A | Discharge status: Home / Self Care | Payer: Medicare Other | Source: Ambulatory Visit | Attending: Orthopedic Surgery | Admitting: Orthopedic Surgery

## 2018-04-22 DIAGNOSIS — M25512 Pain in left shoulder: Secondary | ICD-10-CM

## 2018-05-04 ENCOUNTER — Ambulatory Visit (INDEPENDENT_AMBULATORY_CARE_PROVIDER_SITE_OTHER): Payer: Medicare Other | Admitting: *Deleted

## 2018-05-04 DIAGNOSIS — R55 Syncope and collapse: Secondary | ICD-10-CM | POA: Diagnosis not present

## 2018-05-05 NOTE — Progress Notes (Signed)
[facility] Summary Report / Loop Recorder 

## 2018-05-06 LAB — CUP PACEART REMOTE DEVICE CHECK
Date Time Interrogation Session: 20190402180629
MDC IDC PG IMPLANT DT: 20190228

## 2018-05-26 LAB — CUP PACEART REMOTE DEVICE CHECK
Implantable Pulse Generator Implant Date: 20190228
MDC IDC SESS DTM: 20190505201035

## 2018-05-28 ENCOUNTER — Ambulatory Visit (INDEPENDENT_AMBULATORY_CARE_PROVIDER_SITE_OTHER): Payer: Medicare Other | Admitting: Cardiology

## 2018-05-28 ENCOUNTER — Encounter: Payer: Self-pay | Admitting: Cardiology

## 2018-05-28 VITALS — BP 120/86 | HR 94 | Ht 71.0 in | Wt 181.0 lb

## 2018-05-28 DIAGNOSIS — I493 Ventricular premature depolarization: Secondary | ICD-10-CM | POA: Diagnosis not present

## 2018-05-28 DIAGNOSIS — R55 Syncope and collapse: Secondary | ICD-10-CM

## 2018-05-28 DIAGNOSIS — I471 Supraventricular tachycardia: Secondary | ICD-10-CM

## 2018-05-28 LAB — CUP PACEART INCLINIC DEVICE CHECK
Date Time Interrogation Session: 20190530140701
Implantable Pulse Generator Implant Date: 20190228

## 2018-05-28 NOTE — Progress Notes (Signed)
Electrophysiology Office Note   Date:  05/28/2018   ID:  Nathaniel [name], DOB 1961/10/17, MRN [id number]  PCP:  Patient, No Pcp Per  Cardiologist:  Ellyn Hack Primary Electrophysiologist:  Halbert Jesson Meredith Leeds, MD    Chief Complaint  Patient presents with  . Pacemaker Check    Loop recorder/Syncope/SVT     History of Present Illness: Nathaniel [name] is a 57 y.o. male who is being seen today for the evaluation of syncope at the request of No ref. provider found. Presenting today for electrophysiology evaluation.  He has a history of hyperlipidemia, PSVT on as needed flecainide, CVA.  He is also deaf and uses a sign language interpreter.  He was started on flecainide in 2017.  He was admitted to the hospital in August 2018 and was noted to have SVT.  Flecainide was increased to 100 mg twice a day at that time.  He presented to the emergency room August 31 due to palpitations.  He took an extra flecainide with no benefit.  He went to the emergency room for a second event in 2 days.  Linq monitor implanted 02/26/2018.  Today, denies symptoms of palpitations, chest pain, shortness of breath, orthopnea, PND, lower extremity edema, claudication, dizziness, presyncope, syncope, bleeding, or neurologic sequela. The patient is tolerating medications without difficulties.  Overall he is doing well.  He has noted a few further episodes of palpitations, but these are related to PVCs and short episodes of SVT.   Past Medical History:  Diagnosis Date  . Allergy   . Arthritis    right knee  . Asthma    prn inhaler  . Barrett's esophagus 10-25-14 pt denies   states "minor"  . Chest pain    a. 06/2016 MV: EF 52%, no ischemia or infarct.  . 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 Deaf    Call [phone] for patient's sign language interpreter.   Marland Kitchen GERD (gastroesophageal reflux disease)   . Heartburn    occasional, related to certain foods  . High cholesterol   . Intermittent palpitations  10-25-14 patient denies   a. Event Monitor 04/2014: Mostly NSR - intermittent PACs, - 2 brief runs of PAT/PSVT  . PSVT (paroxysmal supraventricular tachycardia) ([place])    a. 04/2014 Event Monitor: brief PAT/PSVT-->uses prn flecainide.  . Seasonal allergies   . Stroke [facility]) 2013   TIA  . TIA (transient ischemic attack) 10-25-14 pt denies   no current deficits  . Vasovagal syncope    Past Surgical History:  Procedure Laterality Date  . [place]  08/2015   Ranging from mostly sinus rhythm to occasional sinus bradycardia and sinus tachycardia. Heart rate from 53 bpm to 129 bpm. No documented PVCs or PACs noted. No arrhythmia noted. No PSVT or A. Fib. "Heart racing" noted with sinus tachycardia.  . Abdominal Aortic Ultrasound  2012   normal abdominal aorta  . BUNIONECTOMY Left 2012   right done also  . CARDIAC CATHETERIZATION  06/24/2011   wwidely patent normal coronary arteries with normal ejection fraction  . Carotid Artery Dopplers  2012   tortuous but no stenoses.. No subclavian disease  . COLONOSCOPY  2015  . COLONOSCOPY WITH PROPOFOL N/A 11/03/2014   Procedure: COLONOSCOPY WITH PROPOFOL;  Surgeon: Milus Banister, MD;  Location: [facility];  Service: Endoscopy;  Laterality: N/A;  . Foot Implant Removal Right 07/15/2013   @ [place]  . INGUINAL HERNIA REPAIR    . KNEE ARTHROSCOPY Left  06/02/2012  . KNEE ARTHROSCOPY Right fall 2013   x 2  . KNEE ARTHROSCOPY Left 03/03/2013   Procedure: ARTHROSCOPY KNEE WITH CHONDROPLASTY PATELLA  AND LATERAL TIBIAL PLATEAU;  Surgeon: Kerin Salen, MD;  Location: [place];  Service: Orthopedics;  Laterality: Left;  . LOOP RECORDER INSERTION N/A 02/26/2018   Procedure: LOOP RECORDER INSERTION;  Surgeon: Constance Haw, MD;  Location: [facility];  Service: Cardiovascular;  Laterality: N/A;  . NM MYOVIEW LTD  06/2016   No evidence of ischemia or infarction.   Domingo Dimes ECHOCARDIOGRAM  June 2015, August 2018    A) normal EF: 55-60%. No wall motion abnormalities. Gr 1 DD.  Mild MR. Otherwise normal;; b) ([facility]) - Normal LV size and function.  EF 60-65%.  No significant valvular abnormalities.  Marland Kitchen Upper and Lower Extremity Arterial Dopplers  2012   normal arterial flow bilateral upper extremities including subclavian is a carotid;;R. ABI 1.2, L. ABI 1.28. Normal flow velocities. Likely calcified vessels.  Marland Kitchen UPPER GASTROINTESTINAL ENDOSCOPY  2013     Current Outpatient Medications  Medication Sig Dispense Refill  . albuterol (PROVENTIL) (2.5 MG/3ML) 0.083% nebulizer solution Take 3 mLs (2.5 mg total) by nebulization every 6 (six) hours as needed for wheezing or shortness of breath. 75 mL 12  . aspirin EC 81 MG EC tablet Take 1 tablet (81 mg total) by mouth daily. 30 tablet 2  . benzonatate (TESSALON) 100 MG capsule Take 2 capsules (200 mg total) by mouth every 8 (eight) hours. 30 capsule 0  . esomeprazole (NEXIUM) 40 MG capsule TAKE 1 CAPSULE (40 MG TOTAL) BY MOUTH DAILY AT 12 NOON. 30 capsule 9  . flecainide (TAMBOCOR) 100 MG tablet Take 1 tablet (100 mg total) 2 (two) times daily by mouth. 180 tablet 3  . Fluticasone-Salmeterol (ADVAIR) 250-50 MCG/DOSE AEPB Inhale 1 puff into the lungs daily. 60 each 0  . guaiFENesin-codeine (ROBITUSSIN AC) 100-10 MG/5ML syrup Take 5 mLs by mouth 3 (three) times daily as needed for cough. 40 mL 0  . omeprazole (PRILOSEC) 40 MG capsule Take 1 capsule (40 mg total) by mouth daily. Take 30 minutes before first meal of the day. 30 capsule 2  . metoprolol tartrate (LOPRESSOR) 25 MG tablet Take 1 tablet (25 mg total) by mouth 2 (two) times daily. as needed 60 tablet 11   No current facility-administered medications for this visit.     Allergies:   Prednisone; Shellfish-derived products; Shrimp [shellfish allergy]; Simvastatin; Tamsulosin; Adhesive [tape]; Simvastatin - high dose  [simvastatin-high dose]; Fish allergy; Fish-derived products; and Isovue [iopamidol]    Social History:  The patient  reports that he has never smoked. He has never used smokeless tobacco. He reports that he drinks alcohol. He reports that he does not use drugs.   Family History:  The patient's family history includes Coronary artery disease in his father; Heart attack (age of onset: 75) in his brother; Heart attack (age of onset: 72) in his paternal grandfather; Heart attack (age of onset: 57) in his father; Ovarian cancer in his mother.    ROS:  Please see the history of present illness.   Otherwise, review of systems is positive for palpitations.   All other systems are reviewed and negative.   PHYSICAL EXAM: VS:  BP 120/86   Pulse 94   Ht 5\' 11"  (1.803 m)   Wt 181 lb (82.1 kg)   BMI 25.24 kg/m  , BMI Body mass index is 25.24 kg/m.  GEN: Well nourished, well developed, in no acute distress  HEENT: normal  Neck: no JVD, carotid bruits, or masses Cardiac: RRR; no murmurs, rubs, or gallops,no edema  Respiratory:  clear to auscultation bilaterally, normal work of breathing GI: soft, nontender, nondistended, + BS MS: no deformity or atrophy  Skin: warm and dry, device site well healed Neuro:  Strength and sensation are intact Psych: euthymic mood, full affect  EKG:  EKG is ordered today. Personal review of the ekg ordered shows sinus rhythm, voltage criteria for LVH, possible inferior MI, rate 94  Personal review of the device interrogation today. Results in [place]: 02/22/2018: BUN 12; Creatinine, Ser 0.94; Hemoglobin 13.6; Platelets 226; Potassium 3.7; Sodium 138    Lipid Panel     Component Value Date/Time   CHOL 166 07/10/2016 0158   TRIG 180 (H) 07/10/2016 0158   HDL 26 (L) 07/10/2016 0158   CHOLHDL 6.4 07/10/2016 0158   VLDL 36 07/10/2016 0158   LDLCALC 104 (H) 07/10/2016 0158     Wt Readings from Last 3 Encounters:  05/28/18 181 lb (82.1 kg)  02/26/18 186 lb (84.4 kg)  02/23/18 185 lb (83.9 kg)      Other studies  Reviewed: Additional studies/ records that were reviewed today include: TTE 2015  Review of the above records today demonstrates:  - Left ventricle: The cavity size was normal. Systolic function was normal. The estimated ejection fraction was in the range of 55% to 60%. Wall motion was normal; there were no regional wall motion abnormalities. Doppler parameters are consistent with abnormal left ventricular relaxation (grade 1 diastolic dysfunction). There was no evidence of elevated ventricular filling pressure by Doppler parameters. - Aortic valve: Trileaflet; normal thickness leaflets. There was no regurgitation. - Aortic root: The aortic root was normal in size. - Mitral valve: Structurally normal valve. There was mild regurgitation. - Left atrium: The atrium was mildly dilated. - Right ventricle: Systolic function was normal. - Right atrium: The atrium was normal in size. - Tricuspid valve: There was no regurgitation. - Pulmonic valve: There was trivial regurgitation. - Pulmonary arteries: Systolic pressure was within the normal range. - Inferior vena cava: The vessel was normal in size. The respirophasic diameter changes were in the normal range (= 50%), consistent with normal central venous pressure.  30 day monitor 10/08/17 - personally reviewed  Mostly normal sinus rhythm with occasional sinus tachycardia and sinus arrhythmia.  Symptoms (chest pain and tightness) were noted with sinus rhythm, sinus tachycardia, sinus arrhythmia.  Very rare PVCs noted.  1 short run of 8-10 beats wide-complex tachycardia noted a symptomatic, but not long enough to cause any significant fin symptoms dings.  ASSESSMENT AND PLAN:  1.  PVCs: He on metoprolol and flecainide and well-controlled.  No changes.  2.  SVT: Metoprolol and flecainide.  Low burden of SVT is seen on his device interrogation.  No changes at this time.  3.  Syncope: Currently has a Linq monitor in  place.  No further episodes of syncope.   Current medicines are reviewed at length with the patient today.   The patient does not have concerns regarding his medicines.  The following changes were made today: None  Labs/ tests ordered today include:  Orders Placed This Encounter  Procedures  . EKG 12-Lead     Disposition:   FU with Fadel Clason 6 months  Signed, Kassidee Narciso Meredith Leeds, MD  05/28/2018 12:37 PM     [facility]  [address] [place] [address] [phone] (office) [phone] (fax)

## 2018-05-28 NOTE — Patient Instructions (Addendum)
Medication Instructions:  Your physician recommends that you continue on your current medications as directed. Please refer to the Current Medication list given to you today.  Labwork: None ordered     *We will only notify you of abnormal results, otherwise continue current treatment plan.  Testing/Procedures: None ordered  Follow-Up: Your physician wants you to follow-up in: 6 months with Dr. Curt Bears.  You will receive a reminder letter in the mail two months in advance. If you don't receive a letter, please call our office to schedule the follow-up appointment.   * If you need a refill on your cardiac medications before your next appointment, please call your pharmacy.   *Please note that any paperwork needing to be filled out by the provider will need to be addressed at the front desk prior to seeing the provider. Please note that any FMLA, disability or other documents regarding health condition is subject to a $25.00 charge that must be received prior to completion of paperwork in the form of a money order or check.  Thank you for choosing [facility]!!   Trinidad Curet, RN [phone]  Any Other Special Instructions Will Be Listed Below (If Applicable).

## 2018-06-05 ENCOUNTER — Ambulatory Visit (INDEPENDENT_AMBULATORY_CARE_PROVIDER_SITE_OTHER): Payer: Medicare Other | Admitting: *Deleted

## 2018-06-05 DIAGNOSIS — R55 Syncope and collapse: Secondary | ICD-10-CM

## 2018-06-05 NOTE — Progress Notes (Signed)
[facility] Summary Report / Loop Recorder 

## 2018-06-28 ENCOUNTER — Emergency Department (HOSPITAL_BASED_OUTPATIENT_CLINIC_OR_DEPARTMENT_OTHER): Payer: Medicare Other

## 2018-06-28 ENCOUNTER — Other Ambulatory Visit: Payer: Self-pay

## 2018-06-28 ENCOUNTER — Emergency Department (HOSPITAL_BASED_OUTPATIENT_CLINIC_OR_DEPARTMENT_OTHER)
Admission: EM | Admit: 2018-06-28 | Discharge: 2018-06-28 | Disposition: A | Discharge status: Home / Self Care | Payer: Medicare Other | Attending: Emergency Medicine | Admitting: Emergency Medicine

## 2018-06-28 ENCOUNTER — Encounter (HOSPITAL_BASED_OUTPATIENT_CLINIC_OR_DEPARTMENT_OTHER): Payer: Self-pay | Admitting: Emergency Medicine

## 2018-06-28 DIAGNOSIS — R55 Syncope and collapse: Secondary | ICD-10-CM | POA: Diagnosis not present

## 2018-06-28 DIAGNOSIS — Z7982 Long term (current) use of aspirin: Secondary | ICD-10-CM | POA: Diagnosis not present

## 2018-06-28 DIAGNOSIS — Z8673 Personal history of transient ischemic attack (TIA), and cerebral infarction without residual deficits: Secondary | ICD-10-CM | POA: Diagnosis not present

## 2018-06-28 DIAGNOSIS — R0602 Shortness of breath: Secondary | ICD-10-CM | POA: Insufficient documentation

## 2018-06-28 DIAGNOSIS — R112 Nausea with vomiting, unspecified: Secondary | ICD-10-CM | POA: Diagnosis not present

## 2018-06-28 DIAGNOSIS — J45909 Unspecified asthma, uncomplicated: Secondary | ICD-10-CM | POA: Diagnosis not present

## 2018-06-28 DIAGNOSIS — R002 Palpitations: Secondary | ICD-10-CM | POA: Diagnosis not present

## 2018-06-28 DIAGNOSIS — R42 Dizziness and giddiness: Secondary | ICD-10-CM | POA: Insufficient documentation

## 2018-06-28 DIAGNOSIS — R072 Precordial pain: Secondary | ICD-10-CM | POA: Diagnosis present

## 2018-06-28 LAB — HEPATIC FUNCTION PANEL
ALT: 16 U/L (ref 0–44)
AST: 23 U/L (ref 15–41)
Albumin: 3.9 g/dL (ref 3.5–5.0)
Alkaline Phosphatase: 50 U/L (ref 38–126)
BILIRUBIN TOTAL: 0.2 mg/dL — AB (ref 0.3–1.2)
Total Protein: 7.4 g/dL (ref 6.5–8.1)

## 2018-06-28 LAB — MAGNESIUM: Magnesium: 2 mg/dL (ref 1.7–2.4)

## 2018-06-28 LAB — CBC
HEMATOCRIT: 41 % (ref 39.0–52.0)
Hemoglobin: 14 g/dL (ref 13.0–17.0)
MCH: 30.7 pg (ref 26.0–34.0)
MCHC: 34.1 g/dL (ref 30.0–36.0)
MCV: 89.9 fL (ref 78.0–100.0)
Platelets: 197 10*3/uL (ref 150–400)
RBC: 4.56 MIL/uL (ref 4.22–5.81)
RDW: 13 % (ref 11.5–15.5)
WBC: 5.7 10*3/uL (ref 4.0–10.5)

## 2018-06-28 LAB — BASIC METABOLIC PANEL
Anion gap: 9 (ref 5–15)
BUN: 16 mg/dL (ref 6–20)
CHLORIDE: 106 mmol/L (ref 98–111)
CO2: 24 mmol/L (ref 22–32)
CREATININE: 0.8 mg/dL (ref 0.61–1.24)
Calcium: 8.8 mg/dL — ABNORMAL LOW (ref 8.9–10.3)
GFR calc Af Amer: >60 mL/min (ref >60)
GFR calc non Af Amer: >60 mL/min (ref >60)
Glucose, Bld: 93 mg/dL (ref 70–99)
Potassium: 3.6 mmol/L (ref 3.5–5.1)
SODIUM: 139 mmol/L (ref 135–145)

## 2018-06-28 LAB — LIPASE, BLOOD: LIPASE: 42 U/L (ref 11–51)

## 2018-06-28 LAB — TROPONIN I

## 2018-06-28 NOTE — ED Provider Notes (Signed)
[facility] EMERGENCY DEPARTMENT Provider Note   CSN: [id number] Arrival date & time: 06/28/18  1130     History   Chief Complaint Chief Complaint  Patient presents with  . Chest Pain    HPI JAHRED TATAR is a 57 y.o. male.  The history is provided by the patient and medical records. The history is limited by a language barrier (sign language). A language interpreter was used.  Chest Pain   This is a recurrent problem. The current episode started 1 to 2 hours ago. The problem occurs constantly. The problem has not changed since onset.The pain is present in the substernal region. The pain is at a severity of 7/10. The pain is moderate. The quality of the pain is described as pressure-like and dull. The pain does not radiate. Associated symptoms include irregular heartbeat, nausea, palpitations, shortness of breath and vomiting. Pertinent negatives include no abdominal pain, no back pain, no claudication, no cough, no diaphoresis, no dizziness, no exertional chest pressure, no fever, no headaches, no malaise/fatigue, no near-syncope and no numbness. He has tried nothing for the symptoms. The treatment provided no relief.    Past Medical History:  Diagnosis Date  . Allergy   . Arthritis    right knee  . Asthma    prn inhaler  . Barrett's esophagus 10-25-14 pt denies   states "minor"  . Chest pain    a. 06/2016 MV: EF 52%, no ischemia or infarct.  . 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 Deaf    Call [phone] for patient's sign language interpreter.   Marland Kitchen GERD (gastroesophageal reflux disease)   . Heartburn    occasional, related to certain foods  . High cholesterol   . Intermittent palpitations 10-25-14 patient denies   a. Event Monitor 04/2014: Mostly NSR - intermittent PACs, - 2 brief runs of PAT/PSVT  . PSVT (paroxysmal supraventricular tachycardia) ([place])    a. 04/2014 Event Monitor: brief PAT/PSVT-->uses prn flecainide.  . Seasonal allergies   . Stroke  [facility]) 2013   TIA  . TIA (transient ischemic attack) 10-25-14 pt denies   no current deficits  . Vasovagal syncope     Patient Active Problem List   Diagnosis Date Noted  . High cholesterol   . Pain in the chest   . Syncope 05/11/2015  . Chest pain of uncertain etiology 28/36/6294  . Sigmoid polyp 11/03/2014  . Barrett's esophagus 10/19/2014  . OSA (obstructive sleep apnea) 07/15/2014  . SVT (supraventricular tachycardia) ([place]) 06/18/2014  . Insomnia 06/18/2014  . Palpitations 05/19/2014  . Normal coronary arteries- 2006 and June 2012 05/19/2014  . PVC (premature ventricular contraction) 05/19/2014  . Chest pain 05/19/2014  . Dyspnea 05/19/2014  . Unspecified visual disturbance 11/02/2013  . Metatarsalgia of right foot 08/20/2013  . Hyperlipidemia 12/22/2012  . Hearing impaired 12/21/2012  . TIA (transient ischemic attack) 12/20/2012  . CVA (cerebral infarction) 12/20/2012  . History of asthma 12/20/2012  . HEARTBURN 10/17/2009    Past Surgical History:  Procedure Laterality Date  . [place]  08/2015   Ranging from mostly sinus rhythm to occasional sinus bradycardia and sinus tachycardia. Heart rate from 53 bpm to 129 bpm. No documented PVCs or PACs noted. No arrhythmia noted. No PSVT or A. Fib. "Heart racing" noted with sinus tachycardia.  . Abdominal Aortic Ultrasound  2012   normal abdominal aorta  . BUNIONECTOMY Left 2012   right done also  . CARDIAC CATHETERIZATION  06/24/2011  wwidely patent normal coronary arteries with normal ejection fraction  . Carotid Artery Dopplers  2012   tortuous but no stenoses.. No subclavian disease  . COLONOSCOPY  2015  . COLONOSCOPY WITH PROPOFOL N/A 11/03/2014   Procedure: COLONOSCOPY WITH PROPOFOL;  Surgeon: Milus Banister, MD;  Location: [facility];  Service: Endoscopy;  Laterality: N/A;  . Foot Implant Removal Right 07/15/2013   @ [facility]  . INGUINAL HERNIA REPAIR    . KNEE ARTHROSCOPY Left 06/02/2012  . KNEE  ARTHROSCOPY Right fall 2013   x 2  . KNEE ARTHROSCOPY Left 03/03/2013   Procedure: ARTHROSCOPY KNEE WITH CHONDROPLASTY PATELLA  AND LATERAL TIBIAL PLATEAU;  Surgeon: Kerin Salen, MD;  Location: [facility];  Service: Orthopedics;  Laterality: Left;  . LOOP RECORDER INSERTION N/A 02/26/2018   Procedure: LOOP RECORDER INSERTION;  Surgeon: Constance Haw, MD;  Location: [facility];  Service: Cardiovascular;  Laterality: N/A;  . NM MYOVIEW LTD  06/2016   No evidence of ischemia or infarction.   Domingo Dimes ECHOCARDIOGRAM  June 2015, August 2018   A) normal EF: 55-60%. No wall motion abnormalities. Gr 1 DD.  Mild MR. Otherwise normal;; b) ([facility]) - Normal LV size and function.  EF 60-65%.  No significant valvular abnormalities.  Marland Kitchen Upper and Lower Extremity Arterial Dopplers  2012   normal arterial flow bilateral upper extremities including subclavian is a carotid;;R. ABI 1.2, L. ABI 1.28. Normal flow velocities. Likely calcified vessels.  Marland Kitchen UPPER GASTROINTESTINAL ENDOSCOPY  2013        Home Medications    Prior to Admission medications   Medication Sig Start Date End Date Taking? Authorizing Provider  albuterol (PROVENTIL) (2.5 MG/3ML) 0.083% nebulizer solution Take 3 mLs (2.5 mg total) by nebulization every 6 (six) hours as needed for wheezing or shortness of breath. 02/22/18   Delia Heady, PA-C  aspirin EC 81 MG EC tablet Take 1 tablet (81 mg total) by mouth daily. 05/05/17   Alphonzo Grieve, MD  benzonatate (TESSALON) 100 MG capsule Take 2 capsules (200 mg total) by mouth every 8 (eight) hours. 02/22/18   Khatri, Hina, PA-C  esomeprazole (NEXIUM) 40 MG capsule TAKE 1 CAPSULE (40 MG TOTAL) BY MOUTH DAILY AT 12 NOON. 02/20/18   Milus Banister, MD  flecainide [facility]) 100 MG tablet Take 1 tablet (100 mg total) 2 (two) times daily by mouth. 11/14/17   Leonie Man, MD  Fluticasone-Salmeterol (ADVAIR) 250-50 MCG/DOSE AEPB Inhale 1 puff into the lungs daily.  03/06/16   Gareth Morgan, MD  guaiFENesin-codeine (ROBITUSSIN AC) 100-10 MG/5ML syrup Take 5 mLs by mouth 3 (three) times daily as needed for cough. 02/22/18   Khatri, Hina, PA-C  metoprolol tartrate (LOPRESSOR) 25 MG tablet Take 1 tablet (25 mg total) by mouth 2 (two) times daily. as needed 12/01/17 03/01/18  Leonie Man, MD  omeprazole (PRILOSEC) 40 MG capsule Take 1 capsule (40 mg total) by mouth daily. Take 30 minutes before first meal of the day. 05/04/17   Alphonzo Grieve, MD    Family History Family History  Problem Relation Age of Onset  . Ovarian cancer Mother   . Coronary artery disease Father   . Heart attack Father 31  . Heart attack Brother 76  . Heart attack Paternal Grandfather 46  . Colon cancer Neg Hx   . Stomach cancer Neg Hx   . Esophageal cancer Neg Hx   . Rectal cancer Neg Hx  Social History Social History   Tobacco Use  . Smoking status: Never Smoker  . Smokeless tobacco: Never Used  Substance Use Topics  . Alcohol use: Yes    Alcohol/week: 0.0 oz    Comment: occ  . Drug use: No     Allergies   Prednisone; Shellfish-derived products; Shrimp [shellfish allergy]; Simvastatin; Tamsulosin; Adhesive [tape]; Simvastatin - high dose  [simvastatin-high dose]; Fish allergy; Fish-derived products; and Isovue [iopamidol]   Review of Systems Review of Systems  Constitutional: Negative for chills, diaphoresis, fatigue, fever and malaise/fatigue.  HENT: Negative for congestion.   Respiratory: Positive for shortness of breath. Negative for cough, chest tightness, wheezing and stridor.   Cardiovascular: Positive for chest pain and palpitations. Negative for claudication, leg swelling and near-syncope.  Gastrointestinal: Positive for nausea and vomiting. Negative for abdominal pain, constipation and diarrhea.  Genitourinary: Negative for dysuria, flank pain and frequency.  Musculoskeletal: Negative for back pain, neck pain and neck stiffness.  Neurological:  Positive for syncope (2 days ago) and light-headedness. Negative for dizziness, numbness and headaches.  Psychiatric/Behavioral: Negative for agitation.  All other systems reviewed and are negative.    Physical Exam Updated Vital Signs BP (!) 125/94 (BP Location: Right Arm)   Pulse 88   Temp 98.4 F (36.9 C) (Oral)   Resp 18   Ht 5\' 11"  (1.803 m)   Wt 83.5 kg (184 lb)   SpO2 98%   BMI 25.66 kg/m   Physical Exam  Constitutional: He appears well-developed and well-nourished.  Non-toxic appearance. He does not appear ill. No distress.  HENT:  Head: Normocephalic and atraumatic.  Mouth/Throat: Oropharynx is clear and moist. No oropharyngeal exudate.  Eyes: Pupils are equal, round, and reactive to light. Conjunctivae and EOM are normal.  Neck: Normal range of motion. Neck supple.  Cardiovascular: Normal rate, regular rhythm and intact distal pulses.  No murmur heard. Pulmonary/Chest: Effort normal and breath sounds normal. No respiratory distress. He has no wheezes. He has no rales. He exhibits no tenderness.  Abdominal: Soft. There is no tenderness.  Musculoskeletal: He exhibits no edema or tenderness.  Neurological: He is alert. No sensory deficit. He exhibits normal muscle tone.  Skin: Skin is warm and dry. He is not diaphoretic. No erythema. No pallor.  Psychiatric: He has a normal mood and affect.  Nursing note and vitals reviewed.    ED Treatments / Results  Labs (all labs ordered are listed, but only abnormal results are displayed) Labs Reviewed  BASIC METABOLIC PANEL - Abnormal; Notable for the following components:      Result Value   Calcium 8.8 (*)    All other components within normal limits  HEPATIC FUNCTION PANEL - Abnormal; Notable for the following components:   Total Bilirubin 0.2 (*)    All other components within normal limits  CBC  TROPONIN I  MAGNESIUM  LIPASE, BLOOD  TROPONIN I    EKG EKG Interpretation  Date/Time:  Sunday June 28 2018  11:39:05 EDT Ventricular Rate:  90 PR Interval:  154 QRS Duration: 88 QT Interval:  348 QTC Calculation: 425 R Axis:   6 Text Interpretation:  Normal sinus rhythm Left ventricular hypertrophy Abnormal ECG When compared to prior, no significant changes seen.  No STEMI Confirmed by Antony Blackbird [phone]) on 06/28/2018 11:48:21 AM   Radiology Dg Chest 2 View  Result Date: 06/28/2018 CLINICAL DATA:  Chest pain. EXAM: CHEST - 2 VIEW COMPARISON:  February 22, 2018 FINDINGS: The heart, hila, and mediastinum are  normal. No nodules or masses. No focal infiltrates. No acute abnormalities. IMPRESSION: No active cardiopulmonary disease. Electronically Signed   By: Dorise Bullion III M.D   On: 06/28/2018 12:13    Procedures Procedures (including critical care time)  Medications Ordered in ED Medications - No data to display   Initial Impression / Assessment and Plan / ED Course  I have reviewed the triage vital signs and the nursing notes.  Pertinent labs & imaging results that were available during my care of the patient were reviewed by me and considered in my medical decision making (see chart for details).     GOBLE FUDALA is a 57 y.o. male with a past medical history significant for deafness using sign interpretation, PVCs, SVT, prior stroke, Barrett's esophagus, hyperlipidemia, asthma who presents for an episode of syncope 2 days ago and an episode of lightheadedness, chest pain, palpitations and shortness of breath today.  Patient reports that 2 days ago he was watching TV when he passed out suddenly.  It was witnessed and he reports it lasted around 5 minutes.  Patient then woke up with no postictal.  And no other symptoms.  He has had this happen in the past and he has had evaluation by cardiology in the past and reports that he has had episodes of wide-complex tachycardias and SVTs.  Patient reports that he did well yesterday but today, while at church, patient began feeling his heart  racing, having chest pain that felt like pressure and squeezing, had several episodes of nausea and vomiting, pallor, clamminess, and lightheadedness.  He reports that that lasted until he arrived to the emergency department when his palpitations improved.  He reports he has been having gradually improving chest pain since onset and currently reports it is mild.  He reports no recent medication changes and reports that he continues to take the flecainide.  On exam, chest is nontender.  Abdomen is nontender.  Lungs are clear and patient had no murmurs.  Normal strength in all extremities and normal sensation in extremities.  Normal pulses in upper and lower extremity's.  Patient's EKG showed no STEMI or significant arrhythmia.  Patient work-up to look for acute abnormalities.  Troponin was negative, magnesium normal, and elect lites were all reassuring.  Mild hypocalcemia similar to prior.  CBC and hepatic function reassuring.  Chest x-ray shows no pneumonia or other abnormality.    Given patient's report of syncope and then a concerning chest pain episode today in the setting of history of wide-complex tachycardia, I am concerned about the patient's safety going home since he is already on flecainide.  Cardiology will be called to recommend actions however I suspect they may want the patient be admitted for telemetry observation.  3:32 PM Cardiology look to the patient's EKG and his laboratory testing today and his history.  They feel that troponin.  If his second troponin is negative and he has no further episodes, he will be safe for discharge home to follow-up with his cardiologist in several days and to have his event monitor interrogated.  He reports that we do not have the equipment to do so at this facility but given the patient's debility patient is safe to go home.  Delta troponin was ordered.  Anticipate reassessment discharge if it is negative.  4:26 PM Delta troponin was negative.  Long  discussion was held with patient with the sign language interpreter discussing plan of care.  Patient will follow-up with his cardiologist for interrogation  of his recorder.  Patient understood return precautions and had no other questions or concerns.  Patient was chest pain-free and was discharged in good condition.   Final Clinical Impressions(s) / ED Diagnoses   Final diagnoses:  Precordial pain  Syncope, unspecified syncope type    ED Discharge Orders    None      Clinical Impression: 1. Precordial pain   2. Syncope, unspecified syncope type     Disposition: Discharge  Condition: Good  I have discussed the results, Dx and Tx plan with the pt(& family if present). He/she/they expressed understanding and agree(s) with the plan. Discharge instructions discussed at great length. Strict return precautions discussed and pt &/or family have verbalized understanding of the instructions. No further questions at time of discharge.    New Prescriptions   No medications on file    Follow Up: Constance Haw, MD [address] [place] [address] [phone]        Kouper Spinella, Gwenyth Allegra, MD 06/28/18 [phone]

## 2018-06-28 NOTE — ED Triage Notes (Addendum)
Pt is deaf. Sign language interpreter used. Pt c/o intermittent chest pain today. States he feels like his heart rate slows. Also endorses mild SOB and states he vomited 45 min ago.

## 2018-06-28 NOTE — Progress Notes (Signed)
Called from [facility] about Nathaniel [name] who apparently had a syncopal episode 2 days ago. Then had another episode today while in church, which symptomatic palpitations. Admit EKG is normal. Laboratory work is normal and initial troponin is negative. He is on flecainide - QTC is normal. There is a LINQ recorder in place, which cannot be interrogated at [facility]. Patient is reported to feel fine at this point. I would recommend a delta-troponin and can likely be discharged. Will need early EP follow-up and device interrogation to see if there is an arrhythmic cause that correlates with his events. If not, then it suggests they may be vasovagal.  Pixie Casino, MD, [facility], [place] Director of the Advanced Lipid Disorders &  Cardiovascular Risk Reduction Clinic Diplomate of the American Board of Clinical Lipidology Attending Cardiologist  Direct Dial: [phone]  Fax: [phone]  Website:  [email or web address]

## 2018-06-28 NOTE — Discharge Instructions (Signed)
Your work-up today was overall reassuring.  After discussing with the cardiologist, they feel you are safe for discharge home to follow-up with your cardiologist in the next several days.  Please stay hydrated and follow-up as directed.  If any symptoms change or worsen, please return to the nearest emergency department.

## 2018-06-29 ENCOUNTER — Telehealth: Payer: Self-pay | Admitting: Cardiology

## 2018-06-29 NOTE — Telephone Encounter (Signed)
LMOVM for pt to return call 

## 2018-06-29 NOTE — Telephone Encounter (Signed)
-----   Message from Pixie Casino, MD sent at 06/28/2018  3:15 PM EDT ----- Regarding: Please schedule for LINQ interrogation Patient had a syncopal episode - has LINQ in place and will need evaluation this week. See Epic notes.  Thanks.  Dr. Lemmie Evens

## 2018-06-30 NOTE — Telephone Encounter (Signed)
2nd attempt  LMOVM for pt to return call.  

## 2018-07-03 ENCOUNTER — Encounter: Payer: Medicare Other | Admitting: *Deleted

## 2018-07-03 NOTE — Telephone Encounter (Signed)
3rd attempt   LMOVM for pt to return call.

## 2018-07-05 ENCOUNTER — Encounter: Payer: Self-pay | Admitting: Cardiology

## 2018-07-06 NOTE — Telephone Encounter (Signed)
4th attempt   My Chart E-Mail sent

## 2018-07-09 ENCOUNTER — Encounter (INDEPENDENT_AMBULATORY_CARE_PROVIDER_SITE_OTHER): Payer: Self-pay

## 2018-07-09 NOTE — Telephone Encounter (Signed)
Manual transmission received on 07-08-2018.

## 2018-07-13 LAB — CUP PACEART REMOTE DEVICE CHECK
Date Time Interrogation Session: 20190607203553
MDC IDC PG IMPLANT DT: 20190228

## 2018-07-24 ENCOUNTER — Telehealth: Payer: Self-pay | Admitting: Gastroenterology

## 2018-07-24 ENCOUNTER — Other Ambulatory Visit: Payer: Self-pay

## 2018-07-24 ENCOUNTER — Emergency Department (HOSPITAL_COMMUNITY): Payer: Medicare Other

## 2018-07-24 ENCOUNTER — Emergency Department (HOSPITAL_COMMUNITY)
Admission: EM | Admit: 2018-07-24 | Discharge: 2018-07-24 | Disposition: A | Discharge status: Home / Self Care | Payer: Medicare Other | Attending: Emergency Medicine | Admitting: Emergency Medicine

## 2018-07-24 ENCOUNTER — Encounter (HOSPITAL_COMMUNITY): Payer: Self-pay | Admitting: Emergency Medicine

## 2018-07-24 DIAGNOSIS — Z79899 Other long term (current) drug therapy: Secondary | ICD-10-CM | POA: Insufficient documentation

## 2018-07-24 DIAGNOSIS — K920 Hematemesis: Secondary | ICD-10-CM | POA: Diagnosis not present

## 2018-07-24 DIAGNOSIS — J45909 Unspecified asthma, uncomplicated: Secondary | ICD-10-CM | POA: Diagnosis not present

## 2018-07-24 DIAGNOSIS — K2900 Acute gastritis without bleeding: Secondary | ICD-10-CM

## 2018-07-24 DIAGNOSIS — Z91018 Allergy to other foods: Secondary | ICD-10-CM | POA: Insufficient documentation

## 2018-07-24 DIAGNOSIS — Z7982 Long term (current) use of aspirin: Secondary | ICD-10-CM | POA: Insufficient documentation

## 2018-07-24 DIAGNOSIS — Z8673 Personal history of transient ischemic attack (TIA), and cerebral infarction without residual deficits: Secondary | ICD-10-CM | POA: Insufficient documentation

## 2018-07-24 LAB — COMPREHENSIVE METABOLIC PANEL
ALT: 16 U/L (ref 0–44)
ANION GAP: 7 (ref 5–15)
AST: 19 U/L (ref 15–41)
Albumin: 3.9 g/dL (ref 3.5–5.0)
Alkaline Phosphatase: 52 U/L (ref 38–126)
BILIRUBIN TOTAL: 0.6 mg/dL (ref 0.3–1.2)
BUN: 21 mg/dL — AB (ref 6–20)
CHLORIDE: 105 mmol/L (ref 98–111)
CO2: 28 mmol/L (ref 22–32)
Calcium: 9.1 mg/dL (ref 8.9–10.3)
Creatinine, Ser: 0.94 mg/dL (ref 0.61–1.24)
GFR calc Af Amer: >60 mL/min (ref >60)
Glucose, Bld: 98 mg/dL (ref 70–99)
Potassium: 3.5 mmol/L (ref 3.5–5.1)
Sodium: 140 mmol/L (ref 135–145)
TOTAL PROTEIN: 7.2 g/dL (ref 6.5–8.1)

## 2018-07-24 LAB — I-STAT CHEM 8, ED
BUN: 19 mg/dL (ref 6–20)
CHLORIDE: 103 mmol/L (ref 98–111)
Calcium, Ion: 1.16 mmol/L (ref 1.15–1.40)
Creatinine, Ser: 0.9 mg/dL (ref 0.61–1.24)
Glucose, Bld: 96 mg/dL (ref 70–99)
HEMATOCRIT: 40 % (ref 39.0–52.0)
Hemoglobin: 13.6 g/dL (ref 13.0–17.0)
POTASSIUM: 3.5 mmol/L (ref 3.5–5.1)
SODIUM: 140 mmol/L (ref 135–145)
TCO2: 26 mmol/L (ref 22–32)

## 2018-07-24 LAB — CBC WITH DIFFERENTIAL/PLATELET
Basophils Absolute: 0 10*3/uL (ref 0.0–0.1)
Basophils Relative: 1 %
Eosinophils Absolute: 0.2 10*3/uL (ref 0.0–0.7)
Eosinophils Relative: 2 %
HCT: 41.6 % (ref 39.0–52.0)
HEMOGLOBIN: 13.9 g/dL (ref 13.0–17.0)
LYMPHS ABS: 1.6 10*3/uL (ref 0.7–4.0)
Lymphocytes Relative: 25 %
MCH: 30.3 pg (ref 26.0–34.0)
MCHC: 33.4 g/dL (ref 30.0–36.0)
MCV: 90.6 fL (ref 78.0–100.0)
MONOS PCT: 6 %
Monocytes Absolute: 0.4 10*3/uL (ref 0.1–1.0)
NEUTROS ABS: 4.2 10*3/uL (ref 1.7–7.7)
NEUTROS PCT: 66 %
PLATELETS: 213 10*3/uL (ref 150–400)
RBC: 4.59 MIL/uL (ref 4.22–5.81)
RDW: 13.1 % (ref 11.5–15.5)
WBC: 6.4 10*3/uL (ref 4.0–10.5)

## 2018-07-24 LAB — ABO/RH: ABO/RH(D): O NEG

## 2018-07-24 LAB — PROTIME-INR
INR: 0.97
PROTHROMBIN TIME: 12.8 s (ref 11.4–15.2)

## 2018-07-24 LAB — TYPE AND SCREEN
ABO/RH(D): O NEG
Antibody Screen: NEGATIVE

## 2018-07-24 MED ORDER — PANTOPRAZOLE SODIUM 40 MG IV SOLR
40.0000 mg | Freq: Once | INTRAVENOUS | Status: AC
  Administered 2018-07-24: 40 mg via INTRAVENOUS
  Filled 2018-07-24: qty 40

## 2018-07-24 MED ORDER — PANTOPRAZOLE SODIUM 40 MG IV SOLR
80.0000 mg | Freq: Once | INTRAVENOUS | Status: DC
  Filled 2018-07-24: qty 80

## 2018-07-24 MED ORDER — ONDANSETRON 4 MG PO TBDP: ORAL_TABLET | ORAL | 0 refills | Status: DC

## 2018-07-24 MED ORDER — SUCRALFATE 1 G PO TABS: 1.0000 g | ORAL_TABLET | Freq: Three times a day (TID) | ORAL | 1 refills | Status: DC

## 2018-07-24 MED ORDER — SODIUM CHLORIDE 0.9 % IV SOLN
8.0000 mg/h | INTRAVENOUS | Status: DC
  Filled 2018-07-24: qty 80

## 2018-07-24 NOTE — Discharge Instructions (Addendum)
Follow-up with your gastroenterologist next week.  Return to the emergency department if any problems

## 2018-07-24 NOTE — ED Provider Notes (Signed)
[facility] DEPT Provider Note   CSN: [id number] Arrival date & time: 07/24/18  1001     History   Chief Complaint Chief Complaint  Patient presents with  . Hematemesis    HPI Nathaniel [name] is a 57 y.o. male.  Patient has a history of GERD.  Patient takes a baby aspirin a day.  Patient complains of vomiting small amounts of blood.  No pain  The history is provided by the patient. No language interpreter was used.  Illness  This is a new problem. The current episode started 12 to 24 hours ago. The problem occurs rarely. The problem has not changed since onset.Associated symptoms include abdominal pain. Pertinent negatives include no chest pain and no headaches. Nothing aggravates the symptoms. Nothing relieves the symptoms. He has tried nothing for the symptoms. The treatment provided no relief.    Past Medical History:  Diagnosis Date  . Allergy   . Arthritis    right knee  . Asthma    prn inhaler  . Barrett's esophagus 10-25-14 pt denies   states "minor"  . Chest pain    a. 06/2016 MV: EF 52%, no ischemia or infarct.  . 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 Deaf    Call [phone] for patient's sign language interpreter.   Marland Kitchen GERD (gastroesophageal reflux disease)   . Heartburn    occasional, related to certain foods  . High cholesterol   . Intermittent palpitations 10-25-14 patient denies   a. Event Monitor 04/2014: Mostly NSR - intermittent PACs, - 2 brief runs of PAT/PSVT  . PSVT (paroxysmal supraventricular tachycardia) ([place])    a. 04/2014 Event Monitor: brief PAT/PSVT-->uses prn flecainide.  . Seasonal allergies   . Stroke [facility]) 2013   TIA  . TIA (transient ischemic attack) 10-25-14 pt denies   no current deficits  . Vasovagal syncope     Patient Active Problem List   Diagnosis Date Noted  . High cholesterol   . Pain in the chest   . Syncope 05/11/2015  . Chest pain of uncertain etiology 62/69/4854  . Sigmoid polyp  11/03/2014  . Barrett's esophagus 10/19/2014  . OSA (obstructive sleep apnea) 07/15/2014  . SVT (supraventricular tachycardia) ([place]) 06/18/2014  . Insomnia 06/18/2014  . Palpitations 05/19/2014  . Normal coronary arteries- 2006 and June 2012 05/19/2014  . PVC (premature ventricular contraction) 05/19/2014  . Chest pain 05/19/2014  . Dyspnea 05/19/2014  . Unspecified visual disturbance 11/02/2013  . Metatarsalgia of right foot 08/20/2013  . Hyperlipidemia 12/22/2012  . Hearing impaired 12/21/2012  . TIA (transient ischemic attack) 12/20/2012  . CVA (cerebral infarction) 12/20/2012  . History of asthma 12/20/2012  . HEARTBURN 10/17/2009    Past Surgical History:  Procedure Laterality Date  . [place]  08/2015   Ranging from mostly sinus rhythm to occasional sinus bradycardia and sinus tachycardia. Heart rate from 53 bpm to 129 bpm. No documented PVCs or PACs noted. No arrhythmia noted. No PSVT or A. Fib. "Heart racing" noted with sinus tachycardia.  . Abdominal Aortic Ultrasound  2012   normal abdominal aorta  . BUNIONECTOMY Left 2012   right done also  . CARDIAC CATHETERIZATION  06/24/2011   wwidely patent normal coronary arteries with normal ejection fraction  . Carotid Artery Dopplers  2012   tortuous but no stenoses.. No subclavian disease  . COLONOSCOPY  2015  . COLONOSCOPY WITH PROPOFOL N/A 11/03/2014   Procedure: COLONOSCOPY WITH PROPOFOL;  Surgeon: Quillian Quince  Merrily Brittle, MD;  Location: [facility];  Service: Endoscopy;  Laterality: N/A;  . Foot Implant Removal Right 07/15/2013   @ [place]  . INGUINAL HERNIA REPAIR    . KNEE ARTHROSCOPY Left 06/02/2012  . KNEE ARTHROSCOPY Right fall 2013   x 2  . KNEE ARTHROSCOPY Left 03/03/2013   Procedure: ARTHROSCOPY KNEE WITH CHONDROPLASTY PATELLA  AND LATERAL TIBIAL PLATEAU;  Surgeon: Kerin Salen, MD;  Location: [facility];  Service: Orthopedics;  Laterality: Left;  . LOOP RECORDER INSERTION N/A 02/26/2018    Procedure: LOOP RECORDER INSERTION;  Surgeon: Constance Haw, MD;  Location: [facility];  Service: Cardiovascular;  Laterality: N/A;  . NM MYOVIEW LTD  06/2016   No evidence of ischemia or infarction.   Domingo Dimes ECHOCARDIOGRAM  June 2015, August 2018   A) normal EF: 55-60%. No wall motion abnormalities. Gr 1 DD.  Mild MR. Otherwise normal;; b) ([facility]) - Normal LV size and function.  EF 60-65%.  No significant valvular abnormalities.  Marland Kitchen Upper and Lower Extremity Arterial Dopplers  2012   normal arterial flow bilateral upper extremities including subclavian is a carotid;;R. ABI 1.2, L. ABI 1.28. Normal flow velocities. Likely calcified vessels.  Marland Kitchen UPPER GASTROINTESTINAL ENDOSCOPY  2013        Home Medications    Prior to Admission medications   Medication Sig Start Date End Date Taking? Authorizing Provider  albuterol (PROVENTIL) (2.5 MG/3ML) 0.083% nebulizer solution Take 3 mLs (2.5 mg total) by nebulization every 6 (six) hours as needed for wheezing or shortness of breath. 02/22/18  Yes Khatri, Hina, PA-C  aspirin EC 81 MG EC tablet Take 1 tablet (81 mg total) by mouth daily. 05/05/17  Yes Alphonzo Grieve, MD  esomeprazole (NEXIUM) 40 MG capsule TAKE 1 CAPSULE (40 MG TOTAL) BY MOUTH DAILY AT 12 NOON. 02/20/18  Yes Milus Banister, MD  flecainide [facility]) 100 MG tablet Take 1 tablet (100 mg total) 2 (two) times daily by mouth. 11/14/17  Yes Leonie Man, MD  fluticasone [facility]) 50 MCG/ACT nasal spray Place 2 sprays into both nostrils daily. 06/30/18  Yes [provider]  Fluticasone-Salmeterol (ADVAIR) 250-50 MCG/DOSE AEPB Inhale 1 puff into the lungs daily. 03/06/16  Yes Gareth Morgan, MD  benzonatate (TESSALON) 100 MG capsule Take 2 capsules (200 mg total) by mouth every 8 (eight) hours. Patient not taking: Reported on 07/24/2018 02/22/18   Delia Heady, PA-C  guaiFENesin-codeine (ROBITUSSIN AC) 100-10 MG/5ML syrup Take 5 mLs by mouth 3 (three) times daily  as needed for cough. Patient not taking: Reported on 07/24/2018 02/22/18   Delia Heady, PA-C  metoprolol tartrate (LOPRESSOR) 25 MG tablet Take 1 tablet (25 mg total) by mouth 2 (two) times daily. as needed Patient not taking: Reported on 07/24/2018 12/01/17 03/01/18  Leonie Man, MD  omeprazole (PRILOSEC) 40 MG capsule Take 1 capsule (40 mg total) by mouth daily. Take 30 minutes before first meal of the day. Patient not taking: Reported on 07/24/2018 05/04/17   Alphonzo Grieve, MD  ondansetron (ZOFRAN ODT) 4 MG disintegrating tablet 4mg  ODT q4 hours prn nausea/vomit 07/24/18   Milton Ferguson, MD  sucralfate (CARAFATE) 1 g tablet Take 1 tablet (1 g total) by mouth 4 (four) times daily -  with meals and at bedtime. 07/24/18   Milton Ferguson, MD    Family History Family History  Problem Relation Age of Onset  . Ovarian cancer Mother   . Coronary artery disease Father   .  Heart attack Father 20  . Heart attack Brother 78  . Heart attack Paternal Grandfather 83  . Colon cancer Neg Hx   . Stomach cancer Neg Hx   . Esophageal cancer Neg Hx   . Rectal cancer Neg Hx     Social History Social History   Tobacco Use  . Smoking status: Never Smoker  . Smokeless tobacco: Never Used  Substance Use Topics  . Alcohol use: Yes    Alcohol/week: 0.0 oz    Comment: occ  . Drug use: No     Allergies   Prednisone; Shellfish-derived products; Shrimp [shellfish allergy]; Adhesive [tape]; Simvastatin; Tamsulosin; Simvastatin - high dose  [simvastatin-high dose]; Fish allergy; Fish-derived products; and Isovue [iopamidol]   Review of Systems Review of Systems  Constitutional: Negative for appetite change and fatigue.  HENT: Negative for congestion, ear discharge and sinus pressure.   Eyes: Negative for discharge.  Respiratory: Negative for cough.   Cardiovascular: Negative for chest pain.  Gastrointestinal: Positive for abdominal pain. Negative for diarrhea.  Genitourinary: Negative for frequency  and hematuria.  Musculoskeletal: Negative for back pain.  Skin: Negative for rash.  Neurological: Negative for seizures and headaches.  Psychiatric/Behavioral: Negative for hallucinations.     Physical Exam Updated Vital Signs BP (!) 125/94 (BP Location: Left Arm)   Pulse 78   Temp 98.6 F (37 C) (Oral)   Resp 19   Ht 5\' 11"  (1.803 m)   Wt 77.6 kg (171 lb)   SpO2 96%   BMI 23.85 kg/m   Physical Exam  Constitutional: He is oriented to person, place, and time. He appears well-developed.  HENT:  Head: Normocephalic.  Eyes: Conjunctivae and EOM are normal. No scleral icterus.  Neck: Neck supple. No thyromegaly present.  Cardiovascular: Normal rate and regular rhythm. Exam reveals no gallop and no friction rub.  No murmur heard. Pulmonary/Chest: No stridor. He has no wheezes. He has no rales. He exhibits no tenderness.  Abdominal: He exhibits no distension. There is no tenderness. There is no rebound.  Musculoskeletal: Normal range of motion. He exhibits no edema.  Lymphadenopathy:    He has no cervical adenopathy.  Neurological: He is oriented to person, place, and time. He exhibits normal muscle tone. Coordination normal.  Skin: No rash noted. No erythema.  Psychiatric: He has a normal mood and affect. His behavior is normal.     ED Treatments / Results  Labs (all labs ordered are listed, but only abnormal results are displayed) Labs Reviewed  COMPREHENSIVE METABOLIC PANEL - Abnormal; Notable for the following components:      Result Value   BUN 21 (*)    All other components within normal limits  CBC WITH DIFFERENTIAL/PLATELET  PROTIME-INR  I-STAT CHEM 8, ED  TYPE AND SCREEN  ABO/RH    EKG None  Radiology Dg Abd Acute W/chest  Result Date: 07/24/2018 CLINICAL DATA:  Weakness EXAM: DG ABDOMEN ACUTE W/ 1V CHEST COMPARISON:  06/28/2018 FINDINGS: Heart size and vascularity normal. Cardiac loop recorder unchanged from the prior study. Lungs are clear without  infiltrate effusion or mass. Normal bowel gas pattern. Negative for obstruction or ileus. No free air. No urinary tract calculi. Lumbar scoliosis. IMPRESSION: Negative abdominal radiographs.  No acute cardiopulmonary disease. Electronically Signed   By: Franchot Gallo M.D.   On: 07/24/2018 13:12    Procedures Procedures (including critical care time)  Medications Ordered in ED Medications  pantoprazole (PROTONIX) injection 40 mg (40 mg Intravenous Given 07/24/18 1224)  Initial Impression / Assessment and Plan / ED Course  I have reviewed the triage vital signs and the nursing notes.  Pertinent labs & imaging results that were available during my care of the patient were reviewed by me and considered in my medical decision making (see chart for details).     Labs unremarkable.  Diagnosis gastritis.  Patient will be placed on Carafate and Zofran.  He will follow-up with the GI doctor next week and return if problems  Final Clinical Impressions(s) / ED Diagnoses   Final diagnoses:  Hematemesis with nausea  Acute gastritis, presence of bleeding unspecified, unspecified gastritis type    ED Discharge Orders        Ordered    sucralfate (CARAFATE) 1 g tablet  3 times daily with meals & bedtime     07/24/18 1357    ondansetron (ZOFRAN ODT) 4 MG disintegrating tablet     07/24/18 1357       Milton Ferguson, MD 07/24/18 1402

## 2018-07-24 NOTE — ED Notes (Signed)
Coming from GI, states vomiting blood since last night

## 2018-07-24 NOTE — ED Triage Notes (Signed)
Pt reports vomiting blood last week and again last night. Pt has taken pictures to show the difference in amount of blood last week vs last night. Pt has hx of acid reflux pt feels it stuck in his throat and that causes him to gaga and vomit. Patient denies nausea. Pt reports epigastric and esophageal pain. Patient has a gastroenterologist for reflux.

## 2018-07-24 NOTE — ED Notes (Signed)
Pt is alert and oriented x 4 and is  Responsive via sign language.  Pt denies pain at this time. Discussed with Pt plan of care at this time. Interpreter is at bedside.

## 2018-07-24 NOTE — Telephone Encounter (Signed)
Spoke with pt via relay interpreter and he states he is afraid. Discussed with pt that he should proceed to the ER to be evaluated. He confirmed that he will go to the ER.

## 2018-07-31 ENCOUNTER — Encounter: Payer: Self-pay | Admitting: Physician Assistant

## 2018-07-31 ENCOUNTER — Ambulatory Visit (INDEPENDENT_AMBULATORY_CARE_PROVIDER_SITE_OTHER): Payer: Medicare Other | Admitting: Physician Assistant

## 2018-07-31 ENCOUNTER — Other Ambulatory Visit (INDEPENDENT_AMBULATORY_CARE_PROVIDER_SITE_OTHER): Payer: Medicare Other

## 2018-07-31 VITALS — BP 126/84 | HR 78 | Ht 71.0 in | Wt 182.0 lb

## 2018-07-31 DIAGNOSIS — K920 Hematemesis: Secondary | ICD-10-CM | POA: Diagnosis not present

## 2018-07-31 DIAGNOSIS — Z86010 Personal history of colonic polyps: Secondary | ICD-10-CM

## 2018-07-31 DIAGNOSIS — K2270 Barrett's esophagus without dysplasia: Secondary | ICD-10-CM | POA: Diagnosis not present

## 2018-07-31 LAB — CBC WITH DIFFERENTIAL/PLATELET
BASOS ABS: 0 10*3/uL (ref 0.0–0.1)
Basophils Relative: 0.7 % (ref 0.0–3.0)
EOS PCT: 3 % (ref 0.0–5.0)
Eosinophils Absolute: 0.2 10*3/uL (ref 0.0–0.7)
HCT: 38.6 % — ABNORMAL LOW (ref 39.0–52.0)
HEMOGLOBIN: 13 g/dL (ref 13.0–17.0)
LYMPHS ABS: 1.7 10*3/uL (ref 0.7–4.0)
Lymphocytes Relative: 29.6 % (ref 12.0–46.0)
MCHC: 33.5 g/dL (ref 30.0–36.0)
MCV: 91.4 fl (ref 78.0–100.0)
MONO ABS: 0.4 10*3/uL (ref 0.1–1.0)
MONOS PCT: 7.2 % (ref 3.0–12.0)
NEUTROS PCT: 59.5 % (ref 43.0–77.0)
Neutro Abs: 3.4 10*3/uL (ref 1.4–7.7)
Platelets: 223 10*3/uL (ref 150.0–400.0)
RBC: 4.23 Mil/uL (ref 4.22–5.81)
RDW: 13.7 % (ref 11.5–15.5)
WBC: 5.8 10*3/uL (ref 4.0–10.5)

## 2018-07-31 MED ORDER — OMEPRAZOLE 40 MG PO CPDR: 40.0000 mg | DELAYED_RELEASE_CAPSULE | Freq: Two times a day (BID) | ORAL | 3 refills | Status: DC

## 2018-07-31 NOTE — Patient Instructions (Signed)
You have been scheduled for an endoscopy. Please follow written instructions given to you at your visit today. If you use inhalers (even only as needed), please bring them with you on the day of your procedure. Your physician has requested that you go to www.startemmi.com and enter the access code given to you at your visit today. This web site gives a general overview about your procedure. However, you should still follow specific instructions given to you by our office regarding your preparation for the procedure.  Increase Omeprazole 40 mg twice a day

## 2018-07-31 NOTE — Progress Notes (Signed)
Chief Complaint: Hematemesis  Review of pertinent gastrointestinal problems: 1.  Chronic GERD. Pyrosis stabbing chest discomforts. Improved while on PPI. Also improved with dietary modifications (quit drinking 4 coffees daily). EGD November 2007 no esophagitis. Non-nodular 1.5 cm single tongue of Barrett's esophagus. No dysplasia seen on  biopsies. 2. Barrett's esophagus. November 2007 EGD with single tongue of salmon-colored mucosa short segment. Biopsies showed no dysplasia.  EGD 2008 same (IM without dysplasia). EGD 2010 same, EGD 2013 same (IM without dysplasia), recall 3 years.  EGD 2017 short segment Barrett's, 1 cm hiatal hernia past showed Barrett's esophagus with reflux changes negative for dysplasia-repeat recommended in 4 years 3. Adenomatous polyps in colon; Colonoscopy Ardis Hughs 10/2014 done for screening, recent diarrhea; 2 small TAs; recommended 5 year recall.  HPI:    Nathaniel [name] is a 57 year old Caucasian male, known to Dr. Ardis Hughs, who presents to clinic today for follow-up after being seen in the ER for hematemesis.     12/19/2014 office visit Dr. Ardis Hughs to discuss Nexium.  He was given samples that day and was recommended to stay on a PPI once daily indefinitely.     07/24/2018 ER visit for hematemesis.  Described history of reflux on a baby aspirin daily and complained of vomiting small amounts of blood.  CMP, CBC and INR all normal.  Acute chest x-ray with abdomen was negative.  At that time diagnosed with gastritis placed on Carafate and Zofran and told to follow with [place].    Today, explains that around 07/17/2018 he started with vomiting, every 15 minutes, this woke him from his sleep in the middle of the night and immediately he saw bright red blood every time that he would vomit.  He does show me pictures of this on his phone.  This continued into the next day and he has continued with intermittent vomiting with bright red blood over the past 2 weeks, typically a few times a day,  worse after eating.  Along with this has seen occasional bright red blood in his bowel movements.  Also accompanied by epigastric discomfort rated as a 8-9/10 which has made it hard for him to sleep at night.  The Carafate and Zofran made no difference to his symptoms.  Also describes a weight loss of 22 pounds in the past couple of weeks.  His diet is very limited and he has no appetite.  Patient is worried in regards to his history of Barrett's esophagus. Continue Omeprazole 40 mg qd.    Denies fever, chills, dizziness, shortness of breath or palpitations.  Past Medical History:  Diagnosis Date  . Allergy   . Arthritis    right knee  . Asthma    prn inhaler  . Barrett's esophagus 10-25-14 pt denies   states "minor"  . Chest pain    a. 06/2016 MV: EF 52%, no ischemia or infarct.  . 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 Deaf    Call [phone] for patient's sign language interpreter.   Marland Kitchen GERD (gastroesophageal reflux disease)   . Heartburn    occasional, related to certain foods  . High cholesterol   . Intermittent palpitations 10-25-14 patient denies   a. Event Monitor 04/2014: Mostly NSR - intermittent PACs, - 2 brief runs of PAT/PSVT  . PSVT (paroxysmal supraventricular tachycardia) ([place])    a. 04/2014 Event Monitor: brief PAT/PSVT-->uses prn flecainide.  . Seasonal allergies   . Stroke [facility]) 2013   TIA  . TIA (transient ischemic attack) 10-25-14 pt denies  no current deficits  . Vasovagal syncope     Past Surgical History:  Procedure Laterality Date  . [place]  08/2015   Ranging from mostly sinus rhythm to occasional sinus bradycardia and sinus tachycardia. Heart rate from 53 bpm to 129 bpm. No documented PVCs or PACs noted. No arrhythmia noted. No PSVT or A. Fib. "Heart racing" noted with sinus tachycardia.  . Abdominal Aortic Ultrasound  2012   normal abdominal aorta  . BUNIONECTOMY Left 2012   right done also  . CARDIAC CATHETERIZATION  06/24/2011     wwidely patent normal coronary arteries with normal ejection fraction  . Carotid Artery Dopplers  2012   tortuous but no stenoses.. No subclavian disease  . COLONOSCOPY  2015  . COLONOSCOPY WITH PROPOFOL N/A 11/03/2014   Procedure: COLONOSCOPY WITH PROPOFOL;  Surgeon: Milus Banister, MD;  Location: [facility];  Service: Endoscopy;  Laterality: N/A;  . Foot Implant Removal Right 07/15/2013   @ [facility]  . INGUINAL HERNIA REPAIR    . KNEE ARTHROSCOPY Left 06/02/2012  . KNEE ARTHROSCOPY Right fall 2013   x 2  . KNEE ARTHROSCOPY Left 03/03/2013   Procedure: ARTHROSCOPY KNEE WITH CHONDROPLASTY PATELLA  AND LATERAL TIBIAL PLATEAU;  Surgeon: Kerin Salen, MD;  Location: [facility];  Service: Orthopedics;  Laterality: Left;  . LOOP RECORDER INSERTION N/A 02/26/2018   Procedure: LOOP RECORDER INSERTION;  Surgeon: Constance Haw, MD;  Location: [facility];  Service: Cardiovascular;  Laterality: N/A;  . NM MYOVIEW LTD  06/2016   No evidence of ischemia or infarction.   Domingo Dimes ECHOCARDIOGRAM  June 2015, August 2018   A) normal EF: 55-60%. No wall motion abnormalities. Gr 1 DD.  Mild MR. Otherwise normal;; b) ([facility]) - Normal LV size and function.  EF 60-65%.  No significant valvular abnormalities.  Marland Kitchen Upper and Lower Extremity Arterial Dopplers  2012   normal arterial flow bilateral upper extremities including subclavian is a carotid;;R. ABI 1.2, L. ABI 1.28. Normal flow velocities. Likely calcified vessels.  Marland Kitchen UPPER GASTROINTESTINAL ENDOSCOPY  2013    Current Outpatient Medications  Medication Sig Dispense Refill  . albuterol (PROVENTIL) (2.5 MG/3ML) 0.083% nebulizer solution Take 3 mLs (2.5 mg total) by nebulization every 6 (six) hours as needed for wheezing or shortness of breath. 75 mL 12  . aspirin EC 81 MG EC tablet Take 1 tablet (81 mg total) by mouth daily. 30 tablet 2  . benzonatate (TESSALON) 100 MG capsule Take 2 capsules (200 mg total) by mouth every 8  (eight) hours. (Patient not taking: Reported on 07/24/2018) 30 capsule 0  . esomeprazole (NEXIUM) 40 MG capsule TAKE 1 CAPSULE (40 MG TOTAL) BY MOUTH DAILY AT 12 NOON. 30 capsule 9  . flecainide (TAMBOCOR) 100 MG tablet Take 1 tablet (100 mg total) 2 (two) times daily by mouth. 180 tablet 3  . fluticasone (FLONASE) 50 MCG/ACT nasal spray Place 2 sprays into both nostrils daily.    . Fluticasone-Salmeterol (ADVAIR) 250-50 MCG/DOSE AEPB Inhale 1 puff into the lungs daily. 60 each 0  . guaiFENesin-codeine (ROBITUSSIN AC) 100-10 MG/5ML syrup Take 5 mLs by mouth 3 (three) times daily as needed for cough. (Patient not taking: Reported on 07/24/2018) 40 mL 0  . metoprolol tartrate (LOPRESSOR) 25 MG tablet Take 1 tablet (25 mg total) by mouth 2 (two) times daily. as needed (Patient not taking: Reported on 07/24/2018) 60 tablet 11  . omeprazole (PRILOSEC) 40 MG  capsule Take 1 capsule (40 mg total) by mouth daily. Take 30 minutes before first meal of the day. (Patient not taking: Reported on 07/24/2018) 30 capsule 2  . ondansetron (ZOFRAN ODT) 4 MG disintegrating tablet 4mg  ODT q4 hours prn nausea/vomit 12 tablet 0  . sucralfate (CARAFATE) 1 g tablet Take 1 tablet (1 g total) by mouth 4 (four) times daily -  with meals and at bedtime. 120 tablet 1   No current facility-administered medications for this visit.     Allergies as of 07/31/2018 - Review Complete 07/24/2018  Allergen Reaction Noted  . Prednisone Palpitations 10/21/2016  . Shellfish-derived products Itching and Rash 11/14/2015  . Shrimp [shellfish allergy] Rash and Itching 10/19/2012  . Adhesive [tape] Itching 10/31/2017  . Simvastatin Other (See Comments) 03/01/2013  . Tamsulosin Other (See Comments) 06/20/2015  . Simvastatin - high dose  [simvastatin-high dose]  08/25/2017  . Fish allergy Rash and Other (See Comments) 11/02/2012  . Fish-derived products Rash and Other (See Comments) 11/14/2015  . Isovue [iopamidol] Anxiety 02/22/2018     Family History  Problem Relation Age of Onset  . Ovarian cancer Mother   . Coronary artery disease Father   . Heart attack Father 41  . Heart attack Brother 98  . Heart attack Paternal Grandfather 36  . Colon cancer Neg Hx   . Stomach cancer Neg Hx   . Esophageal cancer Neg Hx   . Rectal cancer Neg Hx     Social History   Socioeconomic History  . Marital status: Married    Spouse name: Not on file  . Number of children: 1  . Years of education: college  . Highest education level: Not on file  Occupational History  . Occupation: disability  Social Needs  . Financial resource strain: Not on file  . Food insecurity:    Worry: Not on file    Inability: Not on file  . Transportation needs:    Medical: Not on file    Non-medical: Not on file  Tobacco Use  . Smoking status: Never Smoker  . Smokeless tobacco: Never Used  Substance and Sexual Activity  . Alcohol use: Yes    Alcohol/week: 0.0 oz    Comment: occ  . Drug use: No  . Sexual activity: Not on file  Lifestyle  . Physical activity:    Days per week: Not on file    Minutes per session: Not on file  . Stress: Not on file  Relationships  . Social connections:    Talks on phone: Not on file    Gets together: Not on file    Attends religious service: Not on file    Active member of club or organization: Not on file    Attends meetings of clubs or organizations: Not on file    Relationship status: Not on file  . Intimate partner violence:    Fear of current or ex partner: Not on file    Emotionally abused: Not on file    Physically abused: Not on file    Forced sexual activity: Not on file  Other Topics Concern  . Not on file  Social History Narrative   He is deaf, requiring an interpreter.   He started a new job back in 2013, which was much less stressful for him.  He has subsequently gone on to disability.   He is a married father of one. He previously wrote his bike 2 times a week.   Does not smoke  or  drink.          Review of Systems:    Constitutional: No fever or chills Cardiovascular: No chest pain Respiratory: No SOB  Gastrointestinal: See HPI and otherwise negative   Physical Exam:  Vital signs: BP 126/84   Pulse 78   Ht 5\' 11"  (1.803 m)   Wt 182 lb (82.6 kg)   BMI 25.38 kg/m   Constitutional:   Pleasant Caucasian male appears to be in NAD, Well developed, Well nourished, alert and cooperative Respiratory: Respirations even and unlabored. Lungs clear to auscultation bilaterally.   No wheezes, crackles, or rhonchi.  Cardiovascular: Normal S1, S2. No MRG. Regular rate and rhythm. No peripheral edema, cyanosis or pallor.  Gastrointestinal:  Soft, nondistended,moderate epigastric ttp, No rebound or guarding. Normal bowel sounds. No appreciable masses or hepatomegaly. Psychiatric: Demonstrates good judgement and reason without abnormal affect or behaviors.  RELEVANT LABS AND IMAGING: CBC    Component Value Date/Time   WBC 6.4 07/24/2018 1136   RBC 4.59 07/24/2018 1136   HGB 13.6 07/24/2018 1144   HCT 40.0 07/24/2018 1144   PLT 213 07/24/2018 1136   MCV 90.6 07/24/2018 1136   MCH 30.3 07/24/2018 1136   MCHC 33.4 07/24/2018 1136   RDW 13.1 07/24/2018 1136   LYMPHSABS 1.6 07/24/2018 1136   MONOABS 0.4 07/24/2018 1136   EOSABS 0.2 07/24/2018 1136   BASOSABS 0.0 07/24/2018 1136    CMP     Component Value Date/Time   NA 140 07/24/2018 1144   K 3.5 07/24/2018 1144   CL 103 07/24/2018 1144   CO2 28 07/24/2018 1136   GLUCOSE 96 07/24/2018 1144   BUN 19 07/24/2018 1144   CREATININE 0.90 07/24/2018 1144   CALCIUM 9.1 07/24/2018 1136   PROT 7.2 07/24/2018 1136   ALBUMIN 3.9 07/24/2018 1136   AST 19 07/24/2018 1136   ALT 16 07/24/2018 1136   ALKPHOS 52 07/24/2018 1136   BILITOT 0.6 07/24/2018 1136   GFRNONAA >60 07/24/2018 1136   GFRAA >60 07/24/2018 1136    Assessment: 1.  Hematemesis: For the past 2 weeks when vomiting, pictures on his phone appeared to be  more of a streaking of bright red blood, this is not a great volume, patient denies any red flags or accompanying symptoms of shortness of breath or other today, he still looks well-appearing and apparently has still been  working over the past 2 weeks; consider gastritis versus esophagitis versus Mallory-Weiss tear versus other 2.  History of Barrett's esophagus: Last EGD 2017 with short segment Barrett's and no dysplasia repeat recommended in 4 years 3.  History of adenomatous polyps: Repeat colonoscopy due 10/2019  Plan: 1.  Patient is not extremely ill-appearing.  He has been dealing with the symptoms for 2 weeks now.  No accompanying symptoms, no symptomatic anemia.  Will recheck CBC today. 2.  Scheduled patient for an EGD with Dr. Ardis Hughs in 5 days.  Did discuss risk, benefits, limitations and alternatives and patient agrees to proceed. 3.  Patient was told that if his symptoms increase or worsen between now and then recommend that he proceed to the ER. 4.  Increased Omeprazole to 40 mg twice daily, 30-60 minutes before breakfast and dinner. 5.  Patient to follow in clinic per recommendations from Dr. Ardis Hughs after time of procedure.  Ellouise Newer, PA-C [facility] 07/31/2018, 3:03 PM  Cc: No ref. provider found

## 2018-08-03 NOTE — Progress Notes (Signed)
I agree with the above note, plan. His Hb was normal (13.0).

## 2018-08-05 ENCOUNTER — Encounter: Payer: Self-pay | Admitting: Gastroenterology

## 2018-08-05 ENCOUNTER — Ambulatory Visit (AMBULATORY_SURGERY_CENTER): Payer: Medicare Other | Admitting: Gastroenterology

## 2018-08-05 ENCOUNTER — Other Ambulatory Visit: Payer: Self-pay

## 2018-08-05 VITALS — BP 132/76 | HR 99 | Temp 100.2°F | Resp 17 | Ht 71.0 in | Wt 182.0 lb

## 2018-08-05 DIAGNOSIS — K449 Diaphragmatic hernia without obstruction or gangrene: Secondary | ICD-10-CM

## 2018-08-05 DIAGNOSIS — K920 Hematemesis: Secondary | ICD-10-CM | POA: Diagnosis present

## 2018-08-05 DIAGNOSIS — K2270 Barrett's esophagus without dysplasia: Secondary | ICD-10-CM

## 2018-08-05 DIAGNOSIS — K2951 Unspecified chronic gastritis with bleeding: Secondary | ICD-10-CM

## 2018-08-05 MED ORDER — SODIUM CHLORIDE 0.9 % IV SOLN: 500.0000 mL | Freq: Once | INTRAVENOUS | Status: DC

## 2018-08-05 MED ORDER — OMEPRAZOLE 40 MG PO CPDR: 40.0000 mg | DELAYED_RELEASE_CAPSULE | Freq: Every day | ORAL | 3 refills | Status: DC

## 2018-08-05 NOTE — Progress Notes (Signed)
Called to room to assist during endoscopic procedure.  Patient ID and intended procedure confirmed with present staff. Received instructions for my participation in the procedure from the performing physician.  

## 2018-08-05 NOTE — Progress Notes (Signed)
To PACU, VSS. Report to [email or web address] 

## 2018-08-05 NOTE — Patient Instructions (Signed)
Thank you for allowing [place] to care for you today.  Pathology results by mail 2-3 weeks.  Continue Omeprazole (Prilosec) 40 mg twice a day per mouth.       YOU HAD AN ENDOSCOPIC PROCEDURE TODAY AT [facility]:   Refer to the procedure report that was given to you for any specific questions about what was found during the examination.  If the procedure report does not answer your questions, please call your gastroenterologist to clarify.  If you requested that your care partner not be given the details of your procedure findings, then the procedure report has been included in a sealed envelope for you to review at your convenience later.  YOU SHOULD EXPECT: Some feelings of bloating in the abdomen. Passage of more gas than usual.  Walking can help get rid of the air that was put into your GI tract during the procedure and reduce the bloating. If you had a lower endoscopy (such as a colonoscopy or flexible sigmoidoscopy) you may notice spotting of blood in your stool or on the toilet paper. If you underwent a bowel prep for your procedure, you may not have a normal bowel movement for a few days.  Please Note:  You might notice some irritation and congestion in your nose or some drainage.  This is from the oxygen used during your procedure.  There is no need for concern and it should clear up in a day or so.  SYMPTOMS TO REPORT IMMEDIATELY:     Following upper endoscopy (EGD)  Vomiting of blood or coffee ground material  New chest pain or pain under the shoulder blades  Painful or persistently difficult swallowing  New shortness of breath  Fever of 100F or higher  Black, tarry-looking stools  For urgent or emergent issues, a gastroenterologist can be reached at any hour by calling [phone].   DIET:  We do recommend a small meal at first, but then you may proceed to your regular diet.  Drink plenty of fluids but you should avoid alcoholic beverages for 24  hours.  ACTIVITY:  You should plan to take it easy for the rest of today and you should NOT DRIVE or use heavy machinery until tomorrow (because of the sedation medicines used during the test).    FOLLOW UP: Our staff will call the number listed on your records the next business day following your procedure to check on you and address any questions or concerns that you may have regarding the information given to you following your procedure. If we do not reach you, we will leave a message.  However, if you are feeling well and you are not experiencing any problems, there is no need to return our call.  We will assume that you have returned to your regular daily activities without incident.  If any biopsies were taken you will be contacted by phone or by letter within the next 1-3 weeks.  Please call us at [phone] if you have not heard about the biopsies in 3 weeks.    SIGNATURES/CONFIDENTIALITY: You and/or your care partner have signed paperwork which will be entered into your electronic medical record.  These signatures attest to the fact that that the information above on your After Visit Summary has been reviewed and is understood.  Full responsibility of the confidentiality of this discharge information lies with you and/or your care-partner.

## 2018-08-05 NOTE — Op Note (Addendum)
[facility] Patient Name: Nathaniel [name] Procedure Date: 08/05/2018 10:16 AM MRN: [id number] Endoscopist: Milus Banister , MD Age: 57 Referring MD:  Date of Birth: 11-03-1961 Gender: Male Account #: [id number] Procedure:                Upper GI endoscopy Indications:              Hematemesis, normal Hb; Long standing, short                            segment Barrett's without dysplasia; November 2007                            EGD with single tongue of salmon-colored mucosa                            short segment. Biopsies showed no dysplasia. ?EGD                            2008 same (IM without dysplasia). EGD 2010 same,                            EGD 2013 same (IM without dysplasia), recall 3                            years. EGD 2017 short segment Barrett's, 1 cm                            hiatal hernia past showed Barrett's esophagus with                            reflux changes negative for dysplasia Medicines:                Monitored Anesthesia Care Procedure:                Pre-Anesthesia Assessment:                           - Prior to the procedure, a History and Physical                            was performed, and patient medications and                            allergies were reviewed. The patient's tolerance of                            previous anesthesia was also reviewed. The risks                            and benefits of the procedure and the sedation                            options and risks were discussed with the patient.  All questions were answered, and informed consent                            was obtained. Prior Anticoagulants: The patient has                            taken no previous anticoagulant or antiplatelet                            agents. ASA Grade Assessment: II - A patient with                            mild systemic disease. After reviewing the risks                            and benefits,  the patient was deemed in                            satisfactory condition to undergo the procedure.                           After obtaining informed consent, the endoscope was                            passed under direct vision. Throughout the                            procedure, the patient's blood pressure, pulse, and                            oxygen saturations were monitored continuously. The                            Model GIF-[id number]) scope was introduced                            through the mouth, and advanced to the duodenal                            bulb. The upper GI endoscopy was accomplished                            without difficulty. The patient tolerated the                            procedure well. Scope In: Scope Out: Findings:                 Short segment ([place] C0M2) non-nodular Barrett's                            appearing mucosa (unchanged from previous EGD                            appearnce). This was sampled  with forceps. (jar 2)                           Mild inflammation characterized by erythema and                            friability was found in the gastric antrum.                            Biopsies were taken with a cold forceps for                            histology. (jar 1)                           Small hiatal hernia.                           The exam was otherwise without abnormality. Complications:            No immediate complications. Estimated blood loss:                            None. Estimated Blood Loss:     Estimated blood loss: none. Impression:               - Unchanged non-nodular C0M2 Barrett's mucosa,                            biopsied.                           - Mild gastritis, biopsied to check for H. pylori.                           - Small hiatal hernia.                           - The examination was otherwise normal. Recommendation:           - Patient has a contact number available for                             emergencies. The signs and symptoms of potential                            delayed complications were discussed with the                            patient. Return to normal activities tomorrow.                            Written discharge instructions were provided to the                            patient.                           -  Resume previous diet.                           - Continue present medications. Continue twice                            daily omeprazole for now.                           - Await pathology results. He may have actually                            been COUGHING up blood and so pending the path                            results I may get chest films to start workup of                            possible hemoptysis. Milus Banister, MD 08/05/2018 10:37:34 AM This report has been signed electronically.

## 2018-08-06 ENCOUNTER — Encounter: Payer: Self-pay | Admitting: *Deleted

## 2018-08-06 ENCOUNTER — Telehealth: Payer: Self-pay

## 2018-08-06 ENCOUNTER — Telehealth: Payer: Self-pay | Admitting: *Deleted

## 2018-08-06 NOTE — Telephone Encounter (Signed)
LM via interpreter for patient advising of Dr. Macky Lower recommendations.  Also updated patient via [place].

## 2018-08-06 NOTE — Telephone Encounter (Signed)
Left message

## 2018-08-06 NOTE — Telephone Encounter (Signed)
See phone note from 08/06/18.

## 2018-08-06 NOTE — Telephone Encounter (Signed)
Discussed with Dr. Curt Bears via phone.  No changes at this time, continue flecainide, f/u as scheduled on Monday.

## 2018-08-06 NOTE — Telephone Encounter (Signed)
  Follow up Call-  Call back number 08/05/2018 01/09/2016  Post procedure Call Back phone  # [phone] Pt is deaf.  Leave a message and it will interpret for the pt.  He knows to call us back if any questions or problem.  Permission to leave phone message Yes Yes  Some recent data might be hidden     Patient questions:  Do you have a fever, pain , or abdominal swelling? No. Pain Score  0 *  Have you tolerated food without any problems? Yes.    Have you been able to return to your normal activities? Yes.    Do you have any questions about your discharge instructions: Diet   No. Medications  No. Follow up visit  No.  Do you have questions or concerns about your Care? Yes.  Patient wanted to know when he was supposed to come back in. Per report we are waiting on the pathology results to check for H.Pylori. Patient said he is still experiencing the same amount of pain as he did before he had his procedure. I advised patient to wait for results unless the pain got more severe in which that case he should call us back he stated that the hernia was bothering him and that he can tell and has felt it "come out" now for a while and that he thinks that is the reason for his stomach pain. Patient said he would wait for a follow up phone call on the pathology results.   Actions: * If pain score is 4 or above: No action needed, pain <4.

## 2018-08-06 NOTE — Telephone Encounter (Signed)
LM for patient requesting call back. [facility] phone number.  Tachy episode from 08/04/18 transmitted overnight.  ECG shows WCT, duration 7sec, occurred at 2321.  Will determine if patient symptomatic with episode.  Spoke with [employer] ([employer]  Wes requested that I text the patient my questions, but I advised this is not possible.  I explained that we do not have notes on file regarding how to call the patient through [employer] interpreting service.  Wes states that the patient does use one of these services, but he is unsure how to contact them.  He agrees to text patient to ask him to call our office through the interpreting service.  [place] number for call.  Will also send MyChart message requesting that patient contact us.

## 2018-08-06 NOTE — Telephone Encounter (Signed)
Spoke with patient through interpreter at [phone].  Patient reports that he was symptomatic with the episode, woke up from sleep, felt chest pain, like he was "hit in the chest", shortness of breath, cold sweat.  Symptoms lasted ~55min, then he went back to sleep.  He reports compliance with flecainide 100mg  BID.  Patient is aware of upcoming appointment with Dr. Curt Bears on Monday, 08/10/18 at 2:45pm.  Advised that I will have Dr. Curt Bears review episode and I will call him back with any recommendations.  Patient agreeable to plan.

## 2018-08-06 NOTE — Telephone Encounter (Addendum)
Patient returned call via interpreter.  He wanted clarification of Dr. Macky Lower recommendations.  Advised that Dr. Curt Bears recommended continuing current medications until f/u appointment on Monday, no changes at this time.  Advised that we will continue monitoring remotely via [facility] in the interim.  Encouraged patient to call or send a MyChart message for questions or concerns prior to appointment.  Patient agreeable to plan and denies additional questions or concerns at this time.

## 2018-08-10 ENCOUNTER — Ambulatory Visit (INDEPENDENT_AMBULATORY_CARE_PROVIDER_SITE_OTHER): Payer: Medicare Other | Admitting: *Deleted

## 2018-08-10 ENCOUNTER — Encounter: Payer: Self-pay | Admitting: Cardiology

## 2018-08-10 ENCOUNTER — Ambulatory Visit (INDEPENDENT_AMBULATORY_CARE_PROVIDER_SITE_OTHER): Payer: Medicare Other | Admitting: Cardiology

## 2018-08-10 VITALS — BP 128/80 | HR 101 | Ht 71.0 in | Wt 182.2 lb

## 2018-08-10 DIAGNOSIS — I493 Ventricular premature depolarization: Secondary | ICD-10-CM | POA: Diagnosis not present

## 2018-08-10 DIAGNOSIS — I471 Supraventricular tachycardia: Secondary | ICD-10-CM | POA: Diagnosis not present

## 2018-08-10 DIAGNOSIS — Z79899 Other long term (current) drug therapy: Secondary | ICD-10-CM | POA: Diagnosis not present

## 2018-08-10 DIAGNOSIS — R55 Syncope and collapse: Secondary | ICD-10-CM | POA: Diagnosis not present

## 2018-08-10 LAB — CUP PACEART INCLINIC DEVICE CHECK
Date Time Interrogation Session: 20190812160921
MDC IDC PG IMPLANT DT: 20190228

## 2018-08-10 MED ORDER — FLECAINIDE ACETATE 100 MG PO TABS: 100.0000 mg | ORAL_TABLET | Freq: Two times a day (BID) | ORAL | 3 refills | Status: DC

## 2018-08-10 MED ORDER — METOPROLOL TARTRATE 25 MG PO TABS: 25.0000 mg | ORAL_TABLET | Freq: Two times a day (BID) | ORAL | 2 refills | Status: DC

## 2018-08-10 MED ORDER — AMIODARONE HCL 200 MG PO TABS: ORAL_TABLET | ORAL | 0 refills | Status: DC

## 2018-08-10 MED ORDER — AMIODARONE HCL 200 MG PO TABS: 200.0000 mg | ORAL_TABLET | Freq: Every day | ORAL | 1 refills | Status: DC

## 2018-08-10 NOTE — Progress Notes (Addendum)
Electrophysiology Office Note   Date:  08/10/2018   ID:  Nathaniel [name], DOB [birth year], MRN [id number]  PCP:  Maryan Char, NP  Cardiologist:  Ellyn Hack Primary Electrophysiologist:  Sai Zinn Meredith Leeds, MD    No chief complaint on file.    History of Present Illness: Nathaniel [name] is a 57 y.o. male who is being seen today for the evaluation of syncope at the request of No ref. provider found. Presenting today for electrophysiology evaluation.  He has a history of hyperlipidemia, PSVT on as needed flecainide, CVA.  He is also deaf and uses a sign language interpreter.  He was started on flecainide in 2017.  He was admitted to the hospital in August 2018 and was noted to have SVT.  Flecainide was increased to 100 mg twice a day at that time.  He presented to the emergency room August 31 due to palpitations.  He took an extra flecainide with no benefit.  He went to the emergency room for a second event in 2 days.  Linq monitor implanted 02/26/2018.  Today, denies symptoms of palpitations, chest pain, shortness of breath, orthopnea, PND, lower extremity edema, claudication, dizziness, presyncope, syncope, bleeding, or neurologic sequela. The patient is tolerating medications without difficulties.  He continues to have episodes of palpitations.  His main complaint is weakness and fatigue as well.  On August 6, he had an episode of wide-complex tachycardia that lasted 7 seconds.  This woke him from sleep when he felt an impending sense of doom.   Past Medical History:  Diagnosis Date  . Allergy   . Arthritis    right knee  . Asthma    prn inhaler  . Barrett's esophagus 10-25-14 pt denies   states "minor"  . Chest pain    a. 06/2016 MV: EF 52%, no ischemia or infarct.  . 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 Deaf    Call [phone] for patient's sign language interpreter.   Marland Kitchen GERD (gastroesophageal reflux disease)   . Heartburn    occasional, related to certain foods  .  High cholesterol   . Intermittent palpitations 10-25-14 patient denies   a. Event Monitor 04/2014: Mostly NSR - intermittent PACs, - 2 brief runs of PAT/PSVT  . PSVT (paroxysmal supraventricular tachycardia) ([place])    a. 04/2014 Event Monitor: brief PAT/PSVT-->uses prn flecainide.  . Seasonal allergies   . Stroke [facility]) 2013   TIA  . TIA (transient ischemic attack) 10-25-14 pt denies   no current deficits  . Vasovagal syncope    Past Surgical History:  Procedure Laterality Date  . [place]  08/2015   Ranging from mostly sinus rhythm to occasional sinus bradycardia and sinus tachycardia. Heart rate from 53 bpm to 129 bpm. No documented PVCs or PACs noted. No arrhythmia noted. No PSVT or A. Fib. "Heart racing" noted with sinus tachycardia.  . Abdominal Aortic Ultrasound  2012   normal abdominal aorta  . BUNIONECTOMY Left 2012   right done also  . CARDIAC CATHETERIZATION  06/24/2011   wwidely patent normal coronary arteries with normal ejection fraction  . Carotid Artery Dopplers  2012   tortuous but no stenoses.. No subclavian disease  . COLONOSCOPY  2015  . COLONOSCOPY WITH PROPOFOL N/A 11/03/2014   Procedure: COLONOSCOPY WITH PROPOFOL;  Surgeon: Milus Banister, MD;  Location: [facility];  Service: Endoscopy;  Laterality: N/A;  . Foot Implant Removal Right 07/15/2013   @ [place] [facility]  . INGUINAL  HERNIA REPAIR    . KNEE ARTHROSCOPY Left 06/02/2012  . KNEE ARTHROSCOPY Right fall 2013   x 2  . KNEE ARTHROSCOPY Left 03/03/2013   Procedure: ARTHROSCOPY KNEE WITH CHONDROPLASTY PATELLA  AND LATERAL TIBIAL PLATEAU;  Surgeon: Kerin Salen, MD;  Location: [facility];  Service: Orthopedics;  Laterality: Left;  . LOOP RECORDER INSERTION N/A 02/26/2018   Procedure: LOOP RECORDER INSERTION;  Surgeon: Constance Haw, MD;  Location: [place] [facility];  Service: Cardiovascular;  Laterality: N/A;  . NM MYOVIEW LTD  06/2016   No evidence of ischemia or infarction.   Domingo Dimes ECHOCARDIOGRAM  June 2015, August 2018   A) normal EF: 55-60%. No wall motion abnormalities. Gr 1 DD.  Mild MR. Otherwise normal;; b) ([facility]) - Normal LV size and function.  EF 60-65%.  No significant valvular abnormalities.  Marland Kitchen Upper and Lower Extremity Arterial Dopplers  2012   normal arterial flow bilateral upper extremities including subclavian is a carotid;;R. ABI 1.2, L. ABI 1.28. Normal flow velocities. Likely calcified vessels.  Marland Kitchen UPPER GASTROINTESTINAL ENDOSCOPY  2013     Current Outpatient Medications  Medication Sig Dispense Refill  . albuterol (PROVENTIL) (2.5 MG/3ML) 0.083% nebulizer solution Take 3 mLs (2.5 mg total) by nebulization every 6 (six) hours as needed for wheezing or shortness of breath. 75 mL 12  . aspirin EC 81 MG EC tablet Take 1 tablet (81 mg total) by mouth daily. 30 tablet 2  . fluticasone (FLONASE) 50 MCG/ACT nasal spray Place 2 sprays into both nostrils daily.    . Fluticasone-Salmeterol (ADVAIR) 250-50 MCG/DOSE AEPB Inhale 1 puff into the lungs daily. 60 each 0  . omeprazole (PRILOSEC) 40 MG capsule Take 1 capsule (40 mg total) by mouth 2 (two) times daily. 90 capsule 3  . omeprazole (PRILOSEC) 40 MG capsule Take 1 capsule (40 mg total) by mouth daily. 90 capsule 3  . ondansetron (ZOFRAN ODT) 4 MG disintegrating tablet 4mg  ODT q4 hours prn nausea/vomit 12 tablet 0  . sucralfate (CARAFATE) 1 g tablet Take 1 tablet (1 g total) by mouth 4 (four) times daily -  with meals and at bedtime. 120 tablet 1   Current Facility-Administered Medications  Medication Dose Route Frequency Provider Last Rate Last Dose  . 0.9 %  sodium chloride infusion  500 mL Intravenous Once Armbruster, Carlota Raspberry, MD        Allergies:   Prednisone; Shellfish-derived products; Shrimp [shellfish allergy]; Adhesive [tape]; Simvastatin; Tamsulosin; Simvastatin - high dose  [simvastatin-high dose]; Fish allergy; Fish-derived products; and Isovue [iopamidol]   Social History:  The  patient  reports that he has never smoked. He has never used smokeless tobacco. He reports that he does not drink alcohol or use drugs.   Family History:  The patient's family history includes Coronary artery disease in his father; Heart attack (age of onset: 16) in his brother; Heart attack (age of onset: 64) in his paternal grandfather; Heart attack (age of onset: 35) in his father; Ovarian cancer in his mother.    ROS:  Please see the history of present illness.   Otherwise, review of systems is positive for chest pain, palpitations.   All other systems are reviewed and negative.   PHYSICAL EXAM: VS:  BP 128/80   Pulse (!) 101   Ht 5\' 11"  (1.803 m)   Wt 182 lb 3.2 oz (82.6 kg)   SpO2 95%   BMI 25.41 kg/m  , BMI Body mass index  is 25.41 kg/m. GEN: Well nourished, well developed, in no acute distress  HEENT: normal  Neck: no JVD, carotid bruits, or masses Cardiac: RRR; no murmurs, rubs, or gallops,no edema  Respiratory:  clear to auscultation bilaterally, normal work of breathing GI: soft, nontender, nondistended, + BS MS: no deformity or atrophy  Skin: warm and dry, device site well healed Neuro:  Strength and sensation are intact Psych: euthymic mood, full affect  EKG:  EKG is not ordered today. Personal review of the ekg ordered 06/28/18 shows SR, LVH by voltage, rate 90  Personal review of the device interrogation today. Results in [place]: 06/28/2018: Magnesium 2.0 07/24/2018: ALT 16; BUN 19; Creatinine, Ser 0.90; Potassium 3.5; Sodium 140 07/31/2018: Hemoglobin 13.0; Platelets 223.0    Lipid Panel     Component Value Date/Time   CHOL 166 07/10/2016 0158   TRIG 180 (H) 07/10/2016 0158   HDL 26 (L) 07/10/2016 0158   CHOLHDL 6.4 07/10/2016 0158   VLDL 36 07/10/2016 0158   LDLCALC 104 (H) 07/10/2016 0158     Wt Readings from Last 3 Encounters:  08/10/18 182 lb 3.2 oz (82.6 kg)  08/05/18 182 lb (82.6 kg)  07/31/18 182 lb (82.6 kg)      Other studies  Reviewed: Additional studies/ records that were reviewed today include: TTE 2015  Review of the above records today demonstrates:  - Left ventricle: The cavity size was normal. Systolic function was normal. The estimated ejection fraction was in the range of 55% to 60%. Wall motion was normal; there were no regional wall motion abnormalities. Doppler parameters are consistent with abnormal left ventricular relaxation (grade 1 diastolic dysfunction). There was no evidence of elevated ventricular filling pressure by Doppler parameters. - Aortic valve: Trileaflet; normal thickness leaflets. There was no regurgitation. - Aortic root: The aortic root was normal in size. - Mitral valve: Structurally normal valve. There was mild regurgitation. - Left atrium: The atrium was mildly dilated. - Right ventricle: Systolic function was normal. - Right atrium: The atrium was normal in size. - Tricuspid valve: There was no regurgitation. - Pulmonic valve: There was trivial regurgitation. - Pulmonary arteries: Systolic pressure was within the normal range. - Inferior vena cava: The vessel was normal in size. The respirophasic diameter changes were in the normal range (= 50%), consistent with normal central venous pressure.  30 day monitor 10/08/17 - personally reviewed  Mostly normal sinus rhythm with occasional sinus tachycardia and sinus arrhythmia.  Symptoms (chest pain and tightness) were noted with sinus rhythm, sinus tachycardia, sinus arrhythmia.  Very rare PVCs noted.  1 short run of 8-10 beats wide-complex tachycardia noted a symptomatic, but not long enough to cause any significant fin symptoms dings.  ASSESSMENT AND PLAN:  1.  PVCs: Metoprolol and flecainide.  He did have a 7-second run of ventricular tachycardia which was quite symptomatic for him.  He would like to change therapies due to this.  We Garyson Stelly stop his flecainide and start him on amiodarone.  We Tomasina Keasling  also repeat an echocardiogram.  2.  SVT: Currently on flecainide but no metoprolol.  It is possible that his rhythm abnormality was due to aberrancy, but based on his Linq monitor it is impossible to tell.  We Zaahir Pickney  starting him on amiodarone.  Significant fatigue on metoprolol.  3.  Syncope: Linq monitor in place.  No further episodes of syncope.   Current medicines are reviewed at length with the patient today.   The  patient does not have concerns regarding his medicines.  The following changes were made today: Stop flecainide, start amiodarone  Labs/ tests ordered today include:  No orders of the defined types were placed in this encounter.    Disposition:   FU with Hailea Eaglin 3 months  Signed, Tekeyah Santiago Meredith Leeds, MD  08/10/2018 3:46 PM     [facility] [address] [place] [address] [phone] (office) [phone] (fax)

## 2018-08-10 NOTE — Addendum Note (Signed)
Addended by: Stanton Kidney on: 08/10/2018 04:27 PM   Modules accepted: Orders

## 2018-08-10 NOTE — Patient Instructions (Addendum)
Medication Instructions:  Your physician has recommended you make the following change in your medication:  1. STOP Flecainide 2. START Amiodarone on Thursday, 8/15  -  Take 2 tablets (400 mg total) TWICE daily for 2 weeks, then  Take 1 tablet (200 mg total) TWICE daily for 2 weeks, then  Take 1 tablet (200 mg total) ONCE daily  * If you need a refill on your cardiac medications before your next appointment, please call your pharmacy.   Labwork: Today: TSH & LFTs *We will only notify you of abnormal results, otherwise continue current treatment plan.  Testing/Procedures: Your physician has requested that you have an echocardiogram. Echocardiography is a painless test that uses sound waves to create images of your heart. It provides your doctor with information about the size and shape of your heart and how well your heart's chambers and valves are working. This procedure takes approximately one hour. There are no restrictions for this procedure.  Follow-Up: Your physician recommends that you schedule a follow-up appointment in: 3 months with Dr. Curt Bears.  *Please note that any paperwork needing to be filled out by the provider will need to be addressed at the front desk prior to seeing the provider. Please note that any FMLA, disability or other documents regarding health condition is subject to a $25.00 charge that must be received prior to completion of paperwork in the form of a money order or check.  Thank you for choosing [facility]!!   Trinidad Curet, RN [phone]  Any Other Special Instructions Will Be Listed Below (If Applicable).

## 2018-08-10 NOTE — Addendum Note (Signed)
Addended by: Stanton Kidney on: 08/10/2018 03:55 PM   Modules accepted: Orders

## 2018-08-11 LAB — HEPATIC FUNCTION PANEL
ALK PHOS: 58 IU/L (ref 39–117)
ALT: 17 IU/L (ref 0–44)
AST: 19 IU/L (ref 0–40)
Albumin: 4.5 g/dL (ref 3.5–5.5)
Bilirubin Total: <0.2 mg/dL (ref 0.0–1.2)
Bilirubin, Direct: 0.07 mg/dL (ref 0.00–0.40)
TOTAL PROTEIN: 7.6 g/dL (ref 6.0–8.5)

## 2018-08-11 LAB — TSH: TSH: 2.21 u[IU]/mL (ref 0.450–4.500)

## 2018-08-12 NOTE — Progress Notes (Signed)
[facility] Summary Report / Loop Recorder 

## 2018-08-18 ENCOUNTER — Ambulatory Visit: Payer: Self-pay | Admitting: Physician Assistant

## 2018-08-18 ENCOUNTER — Encounter

## 2018-08-19 ENCOUNTER — Other Ambulatory Visit: Payer: Self-pay | Admitting: Cardiology

## 2018-08-19 ENCOUNTER — Other Ambulatory Visit (HOSPITAL_COMMUNITY): Payer: Self-pay

## 2018-08-20 ENCOUNTER — Telehealth: Payer: Self-pay

## 2018-08-20 NOTE — Telephone Encounter (Signed)
He should be fine for surgery -- Has had tons of cardiac evaluations.  Low Risk pt. For low risk surgery  Glenetta Hew c

## 2018-08-20 NOTE — Telephone Encounter (Signed)
   Primary Cardiologist: Glenetta Hew, MD  EP: Dr. Curt Bears   Chart reviewed as part of pre-operative protocol coverage. Anticipated surgery = left shoulder arthroscopy. Both medical and pharmacy clearance request (ASA hold).  History includes: -   Syncope- s/p implantable loop recorder -   SVT -   7 beat run of VT on 08/04/18  (Flecainide stopped, Amiodarone added) -   Low risk NST in 2017. No documented h/o CAD -   Scheduled for 2D echo 08/19/18 but no showed to appt  His next OV is not until November.   Given his above history, including recent VT, I will consult Dr. Curt Bears and Dr. Ellyn Hack for recommendations. ? Repeat ischemic w/u prior to clearance.   Lyda Jester, PA-C 08/20/2018, 2:53 PM

## 2018-08-20 NOTE — Telephone Encounter (Signed)
   [facility] Pre-operative Risk Assessment    Request for surgical clearance:  1. What type of surgery is being performed? Left shoulder Arthroscopy   2. When is this surgery scheduled? 08/25/18   3. What type of clearance is required (medical clearance vs. Pharmacy clearance to hold med vs. Both)? Both  4. Are there any medications that need to be held prior to surgery and how long?Aspirin   5. Practice name and name of physician performing surgery? [facility]/Dr Tamera Punt   6. What is your office phone [phone]   7.   What is your office fax [phone]  8.   Anesthesia type (None, local, MAC, general) ? choice   Nathaniel [name] 08/20/2018, 10:36 AM  _________________________________________________________________   (provider comments below)

## 2018-08-21 ENCOUNTER — Telehealth: Payer: Self-pay | Admitting: Cardiology

## 2018-08-21 ENCOUNTER — Other Ambulatory Visit: Payer: Self-pay

## 2018-08-21 DIAGNOSIS — R042 Hemoptysis: Secondary | ICD-10-CM

## 2018-08-21 NOTE — Telephone Encounter (Signed)
Pt called angry about holding off surgery for change in meds, -- he had been on flecaide and then had 7 sec of Ventricular tach that was symptomatic.  Just started amiodarone on the 15th of august.  I have asked Dr. Curt Bears to address.  Surgery is scheduled for 08/25/18

## 2018-08-21 NOTE — Telephone Encounter (Signed)
Please note - with starting amiodarone, would prefer to wait 4 weeks to have surgery so medication will be in his system.    Please let pt know.

## 2018-08-21 NOTE — Telephone Encounter (Signed)
   Primary Cardiologist: Glenetta Hew, MD  Chart reviewed as part of pre-operative protocol coverage. Given past medical history and time since last visit, based on ACC/AHA guidelines, Nathaniel [name] would be at acceptable risk for the planned procedure without further cardiovascular testing. Low risk cardiac event for low risk surgery.  May hold ASA for 5 days total.   I will route this recommendation to the requesting party via [facility] fax function and remove from pre-op pool.  Please call with questions.  Cecilie Kicks, NP 08/21/2018, 2:25 PM

## 2018-08-21 NOTE — Telephone Encounter (Signed)
Nathaniel [name] from [facility] called stating she saw that the pt has been cleared for his surgery. However nothing was addressed about the ASA. I advised I will f/u with provider as to the ASA and call her back.

## 2018-08-21 NOTE — Telephone Encounter (Signed)
   Primary Cardiologist: Glenetta Hew, MD  Chart reviewed as part of pre-operative protocol coverage. Given past medical history and time since last visit, based on ACC/AHA guidelines, Nathaniel [name] would be at acceptable risk for the planned procedure without further cardiovascular testing.     Please note the pt is very upset about our recommendation of waiting until amiodarone load is complete.  I waited until Dr. Curt Bears was out of procedures and explained how severe the pt's pain is and he feels one week of load should be fine for the surgery.  I have let pt know this.    I will route this recommendation to the requesting party via [facility] fax function and remove from pre-op pool.  Please call with questions.  Cecilie Kicks, NP 08/21/2018, 6:16 PM

## 2018-08-21 NOTE — Telephone Encounter (Signed)
I s/w Nathaniel [name] at [facility] who has been advised of recommendations: pt will need to post pone surgery x 4 weeks as to new start of Amiodarone. It has been advised pt needs to be on Amiodarone x 4 weeks before proceeding with surgery. Advised they will not need another clearance in 4 weeks. Advised aspirin will need to be held for 5 days before the surgery. I will fax this note with the recommendations to [facility] fax # [phone].

## 2018-09-02 ENCOUNTER — Other Ambulatory Visit (HOSPITAL_COMMUNITY): Payer: Self-pay

## 2018-09-03 ENCOUNTER — Other Ambulatory Visit: Payer: Self-pay

## 2018-09-03 ENCOUNTER — Ambulatory Visit (HOSPITAL_COMMUNITY): Payer: Medicare Other | Attending: Cardiology

## 2018-09-03 DIAGNOSIS — Z8673 Personal history of transient ischemic attack (TIA), and cerebral infarction without residual deficits: Secondary | ICD-10-CM | POA: Diagnosis not present

## 2018-09-03 DIAGNOSIS — E785 Hyperlipidemia, unspecified: Secondary | ICD-10-CM | POA: Insufficient documentation

## 2018-09-03 DIAGNOSIS — I071 Rheumatic tricuspid insufficiency: Secondary | ICD-10-CM | POA: Insufficient documentation

## 2018-09-03 DIAGNOSIS — R55 Syncope and collapse: Secondary | ICD-10-CM | POA: Insufficient documentation

## 2018-09-03 DIAGNOSIS — I493 Ventricular premature depolarization: Secondary | ICD-10-CM

## 2018-09-11 ENCOUNTER — Other Ambulatory Visit: Payer: Self-pay | Admitting: Cardiology

## 2018-09-14 ENCOUNTER — Ambulatory Visit (INDEPENDENT_AMBULATORY_CARE_PROVIDER_SITE_OTHER): Payer: Medicare Other | Admitting: *Deleted

## 2018-09-14 DIAGNOSIS — R55 Syncope and collapse: Secondary | ICD-10-CM | POA: Diagnosis not present

## 2018-09-14 NOTE — Progress Notes (Signed)
[facility] Summary Report / Loop Recorder 

## 2018-09-17 LAB — CUP PACEART REMOTE DEVICE CHECK
Date Time Interrogation Session: 20190812213934
MDC IDC PG IMPLANT DT: 20190228

## 2018-09-20 ENCOUNTER — Encounter: Payer: Self-pay | Admitting: Pulmonary Disease

## 2018-09-20 DIAGNOSIS — R042 Hemoptysis: Secondary | ICD-10-CM | POA: Insufficient documentation

## 2018-09-20 HISTORY — DX: Hemoptysis: R04.2

## 2018-09-20 NOTE — H&P (View-Only) (Signed)
Synopsis: Referred in September 2019 for hemoptysis by Maryan Char, NP  Subjective:   PATIENT ID: Nathaniel [name] GENDER: male DOB: 22-May-1961, MRN: [id number]  Chief Complaint  Patient presents with  . Consult    Hemoptysis for 1 year, has seen 5 different doctors, increased SOB and chest pain.     Patient was initially seen in July 2019 in the emergency room with complaints of coughing up blood.  This is presumed related to hematemesis and a prior history of Barrett's esophagus.  Patient was treated as acute gastritis with outpatient follow-up EGD.  EGD was completed August 7 with revealed small area of Barrett's.  At that time the decision was made for referral to pulmonary for evaluation hemoptysis.  Chest imaging completed at his evaluation in the ED in July 2019 revealed no acute cardiopulmonary process however visible implanted cardiac loop monitor.  He has been to see by 4 different doctors over the past 1.5 years. He has 3 endoscopies for additional evaluations, some being for follow up on his barratts. The prior has found barratts esophagus again and no identified source of hemoptysis. Sometimes if he coughs really hard he will have blood mucus.  Coughing up of blood usually occurs in the middle of the night when he wakes up to go to the restroom.  He feels as if he has congestion within the left chest and after several bouts of coughing he will bring up and expectorate mucus that contains bright red and sometimes dark flecks of blood.  He is a life long non-smoker. Please see the picture below that gives a visual to the amount of blood coughed up.   Patient denies family history of chronic lung disease.  He does a significant family history for coronary disease as his father has had a heart attack and he has lost his brothers at young age related to cardiovascular disease.  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       Past Medical History:  Diagnosis Date  . Allergy   . Arthritis    right knee  . Asthma    prn inhaler  . Barrett's esophagus 10-25-14 pt denies   states "minor"  . Chest pain    a. 06/2016 MV: EF 52%, no ischemia or infarct.  . 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 Deaf    Call [phone] for patient's sign language interpreter.   Marland Kitchen GERD (gastroesophageal reflux disease)   . Heartburn    occasional, related to certain foods  . Hemoptysis 09/20/2018  . High cholesterol   . Intermittent palpitations 10-25-14 patient denies   a. Event Monitor 04/2014: Mostly NSR - intermittent PACs, - 2 brief runs of PAT/PSVT  . PSVT (paroxysmal supraventricular tachycardia) ([place])    a. 04/2014 Event Monitor: brief PAT/PSVT-->uses prn flecainide.  . Seasonal allergies   . Stroke [facility]) 2013   TIA  . TIA (transient ischemic attack) 10-25-14 pt denies   no current deficits  . Vasovagal syncope      Family History  Problem Relation Age of Onset  . Ovarian cancer Mother   . Coronary artery disease Father   . Heart attack Father 15  . Heart attack Brother 39  . Heart attack Paternal Grandfather 29  . Colon cancer Neg Hx   . Stomach cancer Neg Hx   . Esophageal cancer Neg Hx   . Rectal cancer Neg Hx  Past Surgical History:  Procedure Laterality Date  . [place]  08/2015   Ranging from mostly sinus rhythm to occasional sinus bradycardia and sinus tachycardia. Heart rate from 53 bpm to 129 bpm. No documented PVCs or PACs noted. No arrhythmia noted. No PSVT or A. Fib. "Heart racing" noted with sinus tachycardia.  . Abdominal Aortic Ultrasound  2012   normal abdominal aorta  . BUNIONECTOMY Left 2012   right done also  . CARDIAC CATHETERIZATION  06/24/2011   wwidely patent normal coronary arteries with normal ejection fraction  . Carotid Artery Dopplers  2012   tortuous but no stenoses.. No subclavian disease  . COLONOSCOPY  2015  . COLONOSCOPY  WITH PROPOFOL N/A 11/03/2014   Procedure: COLONOSCOPY WITH PROPOFOL;  Surgeon: Milus Banister, MD;  Location: [facility];  Service: Endoscopy;  Laterality: N/A;  . Foot Implant Removal Right 07/15/2013   @ [place]  . INGUINAL HERNIA REPAIR    . KNEE ARTHROSCOPY Left 06/02/2012  . KNEE ARTHROSCOPY Right fall 2013   x 2  . KNEE ARTHROSCOPY Left 03/03/2013   Procedure: ARTHROSCOPY KNEE WITH CHONDROPLASTY PATELLA  AND LATERAL TIBIAL PLATEAU;  Surgeon: Kerin Salen, MD;  Location: [facility];  Service: Orthopedics;  Laterality: Left;  . LOOP RECORDER INSERTION N/A 02/26/2018   Procedure: LOOP RECORDER INSERTION;  Surgeon: Constance Haw, MD;  Location: [facility];  Service: Cardiovascular;  Laterality: N/A;  . NM MYOVIEW LTD  06/2016   No evidence of ischemia or infarction.   Domingo Dimes ECHOCARDIOGRAM  June 2015, August 2018   A) normal EF: 55-60%. No wall motion abnormalities. Gr 1 DD.  Mild MR. Otherwise normal;; b) ([facility]) - Normal LV size and function.  EF 60-65%.  No significant valvular abnormalities.  Marland Kitchen Upper and Lower Extremity Arterial Dopplers  2012   normal arterial flow bilateral upper extremities including subclavian is a carotid;;R. ABI 1.2, L. ABI 1.28. Normal flow velocities. Likely calcified vessels.  Marland Kitchen UPPER GASTROINTESTINAL ENDOSCOPY  2013    Social History   Socioeconomic History  . Marital status: Married    Spouse name: Not on file  . Number of children: 1  . Years of education: college  . Highest education level: Not on file  Occupational History  . Occupation: disability  Social Needs  . Financial resource strain: Not on file  . Food insecurity:    Worry: Not on file    Inability: Not on file  . Transportation needs:    Medical: Not on file    Non-medical: Not on file  Tobacco Use  . Smoking status: Never Smoker  . Smokeless tobacco: Never Used  Substance and Sexual Activity  . Alcohol use: Never    Alcohol/week: 0.0 standard  drinks    Frequency: Never    Comment: occ  . Drug use: No  . Sexual activity: Not on file  Lifestyle  . Physical activity:    Days per week: Not on file    Minutes per session: Not on file  . Stress: Not on file  Relationships  . Social connections:    Talks on phone: Not on file    Gets together: Not on file    Attends religious service: Not on file    Active member of club or organization: Not on file    Attends meetings of clubs or organizations: Not on file    Relationship status: Not on file  . Intimate partner  violence:    Fear of current or ex partner: Not on file    Emotionally abused: Not on file    Physically abused: Not on file    Forced sexual activity: Not on file  Other Topics Concern  . Not on file  Social History Narrative   He is deaf, requiring an interpreter.   He started a new job back in 2013, which was much less stressful for him.  He has subsequently gone on to disability.   He is a married father of one. He previously wrote his bike 2 times a week.   Does not smoke or drink.           Allergies  Allergen Reactions  . Prednisone Palpitations  . Shellfish-Derived Products Itching and Rash  . Shrimp [Shellfish Allergy] Rash and Itching  . Adhesive [Tape] Itching  . Simvastatin Other (See Comments)    DEPRESSION, VISUAL DISTURBANCE(FLASHING LIGHTS)  . Tamsulosin Other (See Comments)    Other reaction(s): Confusion Other reaction(s): Other (See Comments) Changes in behavior  Changes in behavior  Changes in behavior   . Simvastatin - High Dose  [Simvastatin-High Dose]     Depression, behavioral issues  . Fish Allergy Rash and Other (See Comments)    SOFTSHELL FISH  . Fish-Derived Products Rash and Other (See Comments)    SOFTSHELL FISH SOFTSHELL [place]  . Isovue [Iopamidol] Anxiety    Pt c/o chest tightness post injection, respiratory rate and o2 sats normal, evaluated by ED RN and respiratory and PA, given IV benadryl Pt states he does NOT  want contrast in the future     Outpatient Medications Prior to Visit  Medication Sig Dispense Refill  . albuterol (PROVENTIL) (2.5 MG/3ML) 0.083% nebulizer solution Take 3 mLs (2.5 mg total) by nebulization every 6 (six) hours as needed for wheezing or shortness of breath. 75 mL 12  . aspirin EC 81 MG EC tablet Take 1 tablet (81 mg total) by mouth daily. 30 tablet 2  . fluticasone (FLONASE) 50 MCG/ACT nasal spray Place 2 sprays into both nostrils daily.    . Fluticasone-Salmeterol (ADVAIR) 250-50 MCG/DOSE AEPB Inhale 1 puff into the lungs daily. 60 each 0  . omeprazole (PRILOSEC) 40 MG capsule Take 1 capsule (40 mg total) by mouth 2 (two) times daily. 90 capsule 3  . amiodarone (PACERONE) 200 MG tablet Take 2 tablets (400 mg total) twice a day for 2 weeks, then take 1 tablet (200 mg total) twice a day for 2 weeks, then take 1 tab once daily (Patient not taking: Reported on 09/21/2018) 84 tablet 0  . amiodarone (PACERONE) 200 MG tablet Take 1 tablet (200 mg total) by mouth daily. (Patient not taking: Reported on 09/21/2018) 90 tablet 1  . omeprazole (PRILOSEC) 40 MG capsule Take 1 capsule (40 mg total) by mouth daily. (Patient not taking: Reported on 09/21/2018) 90 capsule 3  . ondansetron (ZOFRAN ODT) 4 MG disintegrating tablet 73m ODT q4 hours prn nausea/vomit (Patient not taking: Reported on 09/21/2018) 12 tablet 0  . sucralfate (CARAFATE) 1 g tablet Take 1 tablet (1 g total) by mouth 4 (four) times daily -  with meals and at bedtime. (Patient not taking: Reported on 09/21/2018) 120 tablet 1   Facility-Administered Medications Prior to Visit  Medication Dose Route Frequency Provider Last Rate Last Dose  . 0.9 %  sodium chloride infusion  500 mL Intravenous Once Armbruster, SCarlota Raspberry MD        Review of Systems  Constitutional: Negative.  Negative for chills, fever, malaise/fatigue and weight loss.  HENT: Positive for hearing loss. Negative for ear discharge, ear pain, nosebleeds and tinnitus.    Eyes: Negative for blurred vision and double vision.  Respiratory: Positive for cough, hemoptysis and sputum production. Negative for shortness of breath and wheezing.   Cardiovascular: Positive for chest pain. Negative for palpitations and leg swelling.  Gastrointestinal: Positive for heartburn and vomiting. Negative for abdominal pain, diarrhea and nausea.  Genitourinary: Negative for dysuria, frequency and urgency.  Musculoskeletal: Negative for back pain, myalgias and neck pain.  Skin: Negative for itching.  Neurological: Negative for tingling, tremors and headaches.  Endo/Heme/Allergies: Negative.   Psychiatric/Behavioral: Negative for depression and substance abuse. The patient is not nervous/anxious.     Objective:  Physical Exam  Constitutional: He is oriented to person, place, and time. He appears well-developed and well-nourished. No distress.  HENT:  Head: Normocephalic and atraumatic.  Mouth/Throat: Oropharynx is clear and moist.  Wearing glasses  Eyes: Pupils are equal, round, and reactive to light. Conjunctivae are normal. No scleral icterus.  Neck: Neck supple. No JVD present. No tracheal deviation present.  Cardiovascular: Normal rate, regular rhythm, normal heart sounds and intact distal pulses.  No murmur heard. Pulmonary/Chest: Effort normal and breath sounds normal. No accessory muscle usage or stridor. No tachypnea. No respiratory distress. He has no wheezes. He has no rhonchi. He has no rales.  Abdominal: Soft. Bowel sounds are normal. He exhibits no distension. There is no tenderness.  Musculoskeletal: He exhibits no edema or tenderness.  Lymphadenopathy:    He has no cervical adenopathy.  Neurological: He is alert and oriented to person, place, and time.  Skin: Skin is warm and dry. Capillary refill takes less than 2 seconds. No rash noted.  Psychiatric: He has a normal mood and affect. His behavior is normal.  Vitals reviewed.    Vitals:   09/21/18 0907   BP: 126/82  Pulse: 72  SpO2: 95%  Weight: 181 lb 3.2 oz (82.2 kg)  Height: 5' 11" (1.803 m)   95% on RA BMI Readings from Last 3 Encounters:  09/21/18 25.27 kg/m  08/10/18 25.41 kg/m  08/05/18 25.38 kg/m   Wt Readings from Last 3 Encounters:  09/21/18 181 lb 3.2 oz (82.2 kg)  08/10/18 182 lb 3.2 oz (82.6 kg)  08/05/18 182 lb (82.6 kg)    CBC    Component Value Date/Time   WBC 5.8 07/31/2018 1556   RBC 4.23 07/31/2018 1556   HGB 13.0 07/31/2018 1556   HCT 38.6 (L) 07/31/2018 1556   PLT 223.0 07/31/2018 1556   MCV 91.4 07/31/2018 1556   MCH 30.3 07/24/2018 1136   MCHC 33.5 07/31/2018 1556   RDW 13.7 07/31/2018 1556   LYMPHSABS 1.7 07/31/2018 1556   MONOABS 0.4 07/31/2018 1556   EOSABS 0.2 07/31/2018 1556   BASOSABS 0.0 07/31/2018 1556    Chest Imaging: Chest x-ray from July 2019: No acute cardiopulmonary process, no infiltrate  CT chest February 2019: No visible abnormality within the trachea or bronchus, no parenchymal abnormalities except for small areas of groundglass consistent with possible atelectasis  Pulmonary Functions Testing Results: Pending   FeNO: None   Pathology: None   Echocardiogram: None   09/03/2018: - Left ventricle: The cavity size was normal. Systolic function was   normal. The estimated ejection fraction was in the range of 55%   to 60%. There is hypokinesis of the apicalanteroseptal   myocardium. Doppler parameters are consistent  with abnormal left   ventricular relaxation (grade 1 diastolic dysfunction). - Mitral valve: There was trivial regurgitation. - Tricuspid valve: There was mild regurgitation. - Pulmonic valve: There was trivial regurgitation.  Heart Catheterization: None     Assessment & Plan:   Hemoptysis - Plan: Pulmonary function test, C-reactive protein, Sed Rate (ESR), ANA w/Reflex, ANCA Screen Reflex Titer, Glomerular Membrane Antibodies, Basic Metabolic Panel (BMET), Urinalysis  Need for immunization against  influenza - Plan: Flu Vaccine QUAD 36+ mos IM  Discussion: This is a pleasant 57 year old male with a long-standing history going on 1.5 years of periodic hemoptysis.  He has no infectious symptoms.  Prior imaging shows no specific tracheobronchial defect or mass or abnormality within the lung parenchyma.  The differential diagnosis would remain vast.  He did have a urinalysis in 2015 with 3-6 red blood cells and no protein.  No prior labs with evidence of renal disease.  He is a lifelong non-smoker.  After risk versus benefits versus alternative discussion with the patient next best step would be to a direct visualization of the patient's airway to include visualization of the upper airway vocal cords as well as tracheobronchial tree.  We will complete a rheumatologic blood work panel as well as renal function and urinalysis.  Full PFTs upon return to look for any evidence of small airway disease or restrictive defect.  Return to clinic following bronchoscopy.    Current Outpatient Medications:  .  albuterol (PROVENTIL) (2.5 MG/3ML) 0.083% nebulizer solution, Take 3 mLs (2.5 mg total) by nebulization every 6 (six) hours as needed for wheezing or shortness of breath., Disp: 75 mL, Rfl: 12 .  aspirin EC 81 MG EC tablet, Take 1 tablet (81 mg total) by mouth daily., Disp: 30 tablet, Rfl: 2 .  fluticasone (FLONASE) 50 MCG/ACT nasal spray, Place 2 sprays into both nostrils daily., Disp: , Rfl:  .  Fluticasone-Salmeterol (ADVAIR) 250-50 MCG/DOSE AEPB, Inhale 1 puff into the lungs daily., Disp: 60 each, Rfl: 0 .  omeprazole (PRILOSEC) 40 MG capsule, Take 1 capsule (40 mg total) by mouth 2 (two) times daily., Disp: 90 capsule, Rfl: 3 .  amiodarone (PACERONE) 200 MG tablet, Take 2 tablets (400 mg total) twice a day for 2 weeks, then take 1 tablet (200 mg total) twice a day for 2 weeks, then take 1 tab once daily (Patient not taking: Reported on 09/21/2018), Disp: 84 tablet, Rfl: 0 .  amiodarone (PACERONE)  200 MG tablet, Take 1 tablet (200 mg total) by mouth daily. (Patient not taking: Reported on 09/21/2018), Disp: 90 tablet, Rfl: 1 .  omeprazole (PRILOSEC) 40 MG capsule, Take 1 capsule (40 mg total) by mouth daily. (Patient not taking: Reported on 09/21/2018), Disp: 90 capsule, Rfl: 3 .  ondansetron (ZOFRAN ODT) 4 MG disintegrating tablet, 41m ODT q4 hours prn nausea/vomit (Patient not taking: Reported on 09/21/2018), Disp: 12 tablet, Rfl: 0 .  sucralfate (CARAFATE) 1 g tablet, Take 1 tablet (1 g total) by mouth 4 (four) times daily -  with meals and at bedtime. (Patient not taking: Reported on 09/21/2018), Disp: 120 tablet, Rfl: 1  Current Facility-Administered Medications:  .  0.9 %  sodium chloride infusion, 500 mL, Intravenous, Once, Armbruster, SCarlota Raspberry MD   BGarner Nash DO [facility] 09/21/2018 1:03 PM

## 2018-09-20 NOTE — Progress Notes (Signed)
 Synopsis: Referred in September 2019 for hemoptysis by Thacker, Donna C, NP  Subjective:   PATIENT ID: Nathaniel [name] GENDER: male DOB: 12/04/1961, MRN: [id number]  Chief Complaint  Patient presents with  . Consult    Hemoptysis for 1 year, has seen 5 different doctors, increased SOB and chest pain.     Patient was initially seen in July 2019 in the emergency room with complaints of coughing up blood.  This is presumed related to hematemesis and a prior history of Barrett's esophagus.  Patient was treated as acute gastritis with outpatient follow-up EGD.  EGD was completed August 7 with revealed small area of Barrett's.  At that time the decision was made for referral to pulmonary for evaluation hemoptysis.  Chest imaging completed at his evaluation in the ED in July 2019 revealed no acute cardiopulmonary process however visible implanted cardiac loop monitor.  He has been to see by 4 different doctors over the past 1.5 years. He has 3 endoscopies for additional evaluations, some being for follow up on his barratts. The prior has found barratts esophagus again and no identified source of hemoptysis. Sometimes if he coughs really hard he will have blood mucus.  Coughing up of blood usually occurs in the middle of the night when he wakes up to go to the restroom.  He feels as if he has congestion within the left chest and after several bouts of coughing he will bring up and expectorate mucus that contains bright red and sometimes dark flecks of blood.  He is a life long non-smoker. Please see the picture below that gives a visual to the amount of blood coughed up.   Patient denies family history of chronic lung disease.  He does a significant family history for coronary disease as his father has had a heart attack and he has lost his brothers at young age related to cardiovascular disease.  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       Past Medical History:  Diagnosis Date  . Allergy   . Arthritis    right knee  . Asthma    prn inhaler  . Barrett's esophagus 10-25-14 pt denies   states "minor"  . Chest pain    a. 06/2016 MV: EF 52%, no ischemia or infarct.  . 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 Deaf    Call [phone] for patient's sign language interpreter.   . GERD (gastroesophageal reflux disease)   . Heartburn    occasional, related to certain foods  . Hemoptysis 09/20/2018  . High cholesterol   . Intermittent palpitations 10-25-14 patient denies   a. Event Monitor 04/2014: Mostly NSR - intermittent PACs, - 2 brief runs of PAT/PSVT  . PSVT (paroxysmal supraventricular tachycardia) (HCC)    a. 04/2014 Event Monitor: brief PAT/PSVT-->uses prn flecainide.  . Seasonal allergies   . Stroke (HCC) 2013   TIA  . TIA (transient ischemic attack) 10-25-14 pt denies   no current deficits  . Vasovagal syncope      Family History  Problem Relation Age of Onset  . Ovarian cancer Mother   . Coronary artery disease Father   . Heart attack Father 72  . Heart attack Brother 27  . Heart attack Paternal Grandfather 54  . Colon cancer Neg Hx   . Stomach cancer Neg Hx   . Esophageal cancer Neg Hx   . Rectal cancer Neg Hx        Past Surgical History:  Procedure Laterality Date  . 30 DAY CARDIAC EVENT MONITOR  08/2015   Ranging from mostly sinus rhythm to occasional sinus bradycardia and sinus tachycardia. Heart rate from 53 bpm to 129 bpm. No documented PVCs or PACs noted. No arrhythmia noted. No PSVT or A. Fib. "Heart racing" noted with sinus tachycardia.  . Abdominal Aortic Ultrasound  2012   normal abdominal aorta  . BUNIONECTOMY Left 2012   right done also  . CARDIAC CATHETERIZATION  06/24/2011   wwidely patent normal coronary arteries with normal ejection fraction  . Carotid Artery Dopplers  2012   tortuous but no stenoses.. No subclavian disease  . COLONOSCOPY  2015  . COLONOSCOPY  WITH PROPOFOL N/A 11/03/2014   Procedure: COLONOSCOPY WITH PROPOFOL;  Surgeon: Daniel P Jacobs, MD;  Location: [facility];  Service: Endoscopy;  Laterality: N/A;  . Foot Implant Removal Right 07/15/2013   @ [facility]  . INGUINAL HERNIA REPAIR    . KNEE ARTHROSCOPY Left 06/02/2012  . KNEE ARTHROSCOPY Right fall 2013   x 2  . KNEE ARTHROSCOPY Left 03/03/2013   Procedure: ARTHROSCOPY KNEE WITH CHONDROPLASTY PATELLA  AND LATERAL TIBIAL PLATEAU;  Surgeon: Frank J Rowan, MD;  Location:  SURGERY CENTER;  Service: Orthopedics;  Laterality: Left;  . LOOP RECORDER INSERTION N/A 02/26/2018   Procedure: LOOP RECORDER INSERTION;  Surgeon: Camnitz, Will Martin, MD;  Location: [facility];  Service: Cardiovascular;  Laterality: N/A;  . NM MYOVIEW LTD  06/2016   No evidence of ischemia or infarction.   . TRANSTHORACIC ECHOCARDIOGRAM  June 2015, August 2018   A) normal EF: 55-60%. No wall motion abnormalities. Gr 1 DD.  Mild MR. Otherwise normal;; b) ([facility]) - Normal LV size and function.  EF 60-65%.  No significant valvular abnormalities.  . Upper and Lower Extremity Arterial Dopplers  2012   normal arterial flow bilateral upper extremities including subclavian is a carotid;;R. ABI 1.2, L. ABI 1.28. Normal flow velocities. Likely calcified vessels.  . UPPER GASTROINTESTINAL ENDOSCOPY  2013    Social History   Socioeconomic History  . Marital status: Married    Spouse name: Not on file  . Number of children: 1  . Years of education: college  . Highest education level: Not on file  Occupational History  . Occupation: disability  Social Needs  . Financial resource strain: Not on file  . Food insecurity:    Worry: Not on file    Inability: Not on file  . Transportation needs:    Medical: Not on file    Non-medical: Not on file  Tobacco Use  . Smoking status: Never Smoker  . Smokeless tobacco: Never Used  Substance and Sexual Activity  . Alcohol use: Never    Alcohol/week: 0.0 standard  drinks    Frequency: Never    Comment: occ  . Drug use: No  . Sexual activity: Not on file  Lifestyle  . Physical activity:    Days per week: Not on file    Minutes per session: Not on file  . Stress: Not on file  Relationships  . Social connections:    Talks on phone: Not on file    Gets together: Not on file    Attends religious service: Not on file    Active member of club or organization: Not on file    Attends meetings of clubs or organizations: Not on file    Relationship status: Not on file  . Intimate partner   violence:    Fear of current or ex partner: Not on file    Emotionally abused: Not on file    Physically abused: Not on file    Forced sexual activity: Not on file  Other Topics Concern  . Not on file  Social History Narrative   He is deaf, requiring an interpreter.   He started a new job back in 2013, which was much less stressful for him.  He has subsequently gone on to disability.   He is a married father of one. He previously wrote his bike 2 times a week.   Does not smoke or drink.           Allergies  Allergen Reactions  . Prednisone Palpitations  . Shellfish-Derived Products Itching and Rash  . Shrimp [Shellfish Allergy] Rash and Itching  . Adhesive [Tape] Itching  . Simvastatin Other (See Comments)    DEPRESSION, VISUAL DISTURBANCE(FLASHING LIGHTS)  . Tamsulosin Other (See Comments)    Other reaction(s): Confusion Other reaction(s): Other (See Comments) Changes in behavior  Changes in behavior  Changes in behavior   . Simvastatin - High Dose  [Simvastatin-High Dose]     Depression, behavioral issues  . Fish Allergy Rash and Other (See Comments)    SOFTSHELL FISH  . Fish-Derived Products Rash and Other (See Comments)    SOFTSHELL FISH SOFTSHELL [place]  . Isovue [Iopamidol] Anxiety    Pt c/o chest tightness post injection, respiratory rate and o2 sats normal, evaluated by ED RN and respiratory and PA, given IV benadryl Pt states he does NOT  want contrast in the future     Outpatient Medications Prior to Visit  Medication Sig Dispense Refill  . albuterol (PROVENTIL) (2.5 MG/3ML) 0.083% nebulizer solution Take 3 mLs (2.5 mg total) by nebulization every 6 (six) hours as needed for wheezing or shortness of breath. 75 mL 12  . aspirin EC 81 MG EC tablet Take 1 tablet (81 mg total) by mouth daily. 30 tablet 2  . fluticasone (FLONASE) 50 MCG/ACT nasal spray Place 2 sprays into both nostrils daily.    . Fluticasone-Salmeterol (ADVAIR) 250-50 MCG/DOSE AEPB Inhale 1 puff into the lungs daily. 60 each 0  . omeprazole (PRILOSEC) 40 MG capsule Take 1 capsule (40 mg total) by mouth 2 (two) times daily. 90 capsule 3  . amiodarone (PACERONE) 200 MG tablet Take 2 tablets (400 mg total) twice a day for 2 weeks, then take 1 tablet (200 mg total) twice a day for 2 weeks, then take 1 tab once daily (Patient not taking: Reported on 09/21/2018) 84 tablet 0  . amiodarone (PACERONE) 200 MG tablet Take 1 tablet (200 mg total) by mouth daily. (Patient not taking: Reported on 09/21/2018) 90 tablet 1  . omeprazole (PRILOSEC) 40 MG capsule Take 1 capsule (40 mg total) by mouth daily. (Patient not taking: Reported on 09/21/2018) 90 capsule 3  . ondansetron (ZOFRAN ODT) 4 MG disintegrating tablet 36m ODT q4 hours prn nausea/vomit (Patient not taking: Reported on 09/21/2018) 12 tablet 0  . sucralfate (CARAFATE) 1 g tablet Take 1 tablet (1 g total) by mouth 4 (four) times daily -  with meals and at bedtime. (Patient not taking: Reported on 09/21/2018) 120 tablet 1   Facility-Administered Medications Prior to Visit  Medication Dose Route Frequency Provider Last Rate Last Dose  . 0.9 %  sodium chloride infusion  500 mL Intravenous Once Armbruster, SCarlota Raspberry MD        Review of Systems  Constitutional: Negative.  Negative for chills, fever, malaise/fatigue and weight loss.  HENT: Positive for hearing loss. Negative for ear discharge, ear pain, nosebleeds and tinnitus.    Eyes: Negative for blurred vision and double vision.  Respiratory: Positive for cough, hemoptysis and sputum production. Negative for shortness of breath and wheezing.   Cardiovascular: Positive for chest pain. Negative for palpitations and leg swelling.  Gastrointestinal: Positive for heartburn and vomiting. Negative for abdominal pain, diarrhea and nausea.  Genitourinary: Negative for dysuria, frequency and urgency.  Musculoskeletal: Negative for back pain, myalgias and neck pain.  Skin: Negative for itching.  Neurological: Negative for tingling, tremors and headaches.  Endo/Heme/Allergies: Negative.   Psychiatric/Behavioral: Negative for depression and substance abuse. The patient is not nervous/anxious.     Objective:  Physical Exam  Constitutional: He is oriented to person, place, and time. He appears well-developed and well-nourished. No distress.  HENT:  Head: Normocephalic and atraumatic.  Mouth/Throat: Oropharynx is clear and moist.  Wearing glasses  Eyes: Pupils are equal, round, and reactive to light. Conjunctivae are normal. No scleral icterus.  Neck: Neck supple. No JVD present. No tracheal deviation present.  Cardiovascular: Normal rate, regular rhythm, normal heart sounds and intact distal pulses.  No murmur heard. Pulmonary/Chest: Effort normal and breath sounds normal. No accessory muscle usage or stridor. No tachypnea. No respiratory distress. He has no wheezes. He has no rhonchi. He has no rales.  Abdominal: Soft. Bowel sounds are normal. He exhibits no distension. There is no tenderness.  Musculoskeletal: He exhibits no edema or tenderness.  Lymphadenopathy:    He has no cervical adenopathy.  Neurological: He is alert and oriented to person, place, and time.  Skin: Skin is warm and dry. Capillary refill takes less than 2 seconds. No rash noted.  Psychiatric: He has a normal mood and affect. His behavior is normal.  Vitals reviewed.    Vitals:   09/21/18 0907   BP: 126/82  Pulse: 72  SpO2: 95%  Weight: 181 lb 3.2 oz (82.2 kg)  Height: 5' 11" (1.803 m)   95% on RA BMI Readings from Last 3 Encounters:  09/21/18 25.27 kg/m  08/10/18 25.41 kg/m  08/05/18 25.38 kg/m   Wt Readings from Last 3 Encounters:  09/21/18 181 lb 3.2 oz (82.2 kg)  08/10/18 182 lb 3.2 oz (82.6 kg)  08/05/18 182 lb (82.6 kg)    CBC    Component Value Date/Time   WBC 5.8 07/31/2018 1556   RBC 4.23 07/31/2018 1556   HGB 13.0 07/31/2018 1556   HCT 38.6 (L) 07/31/2018 1556   PLT 223.0 07/31/2018 1556   MCV 91.4 07/31/2018 1556   MCH 30.3 07/24/2018 1136   MCHC 33.5 07/31/2018 1556   RDW 13.7 07/31/2018 1556   LYMPHSABS 1.7 07/31/2018 1556   MONOABS 0.4 07/31/2018 1556   EOSABS 0.2 07/31/2018 1556   BASOSABS 0.0 07/31/2018 1556    Chest Imaging: Chest x-ray from July 2019: No acute cardiopulmonary process, no infiltrate  CT chest February 2019: No visible abnormality within the trachea or bronchus, no parenchymal abnormalities except for small areas of groundglass consistent with possible atelectasis  Pulmonary Functions Testing Results: Pending   FeNO: None   Pathology: None   Echocardiogram: None   09/03/2018: - Left ventricle: The cavity size was normal. Systolic function was   normal. The estimated ejection fraction was in the range of 55%   to 60%. There is hypokinesis of the apicalanteroseptal   myocardium. Doppler parameters are consistent   with abnormal left   ventricular relaxation (grade 1 diastolic dysfunction). - Mitral valve: There was trivial regurgitation. - Tricuspid valve: There was mild regurgitation. - Pulmonic valve: There was trivial regurgitation.  Heart Catheterization: None     Assessment & Plan:   Hemoptysis - Plan: Pulmonary function test, C-reactive protein, Sed Rate (ESR), ANA w/Reflex, ANCA Screen Reflex Titer, Glomerular Membrane Antibodies, Basic Metabolic Panel (BMET), Urinalysis  Need for immunization against  influenza - Plan: Flu Vaccine QUAD 36+ mos IM  Discussion: This is a pleasant 57-year-old male with a long-standing history going on 1.5 years of periodic hemoptysis.  He has no infectious symptoms.  Prior imaging shows no specific tracheobronchial defect or mass or abnormality within the lung parenchyma.  The differential diagnosis would remain vast.  He did have a urinalysis in 2015 with 3-6 red blood cells and no protein.  No prior labs with evidence of renal disease.  He is a lifelong non-smoker.  After risk versus benefits versus alternative discussion with the patient next best step would be to a direct visualization of the patient's airway to include visualization of the upper airway vocal cords as well as tracheobronchial tree.  We will complete a rheumatologic blood work panel as well as renal function and urinalysis.  Full PFTs upon return to look for any evidence of small airway disease or restrictive defect.  Return to clinic following bronchoscopy.    Current Outpatient Medications:  .  albuterol (PROVENTIL) (2.5 MG/3ML) 0.083% nebulizer solution, Take 3 mLs (2.5 mg total) by nebulization every 6 (six) hours as needed for wheezing or shortness of breath., Disp: 75 mL, Rfl: 12 .  aspirin EC 81 MG EC tablet, Take 1 tablet (81 mg total) by mouth daily., Disp: 30 tablet, Rfl: 2 .  fluticasone (FLONASE) 50 MCG/ACT nasal spray, Place 2 sprays into both nostrils daily., Disp: , Rfl:  .  Fluticasone-Salmeterol (ADVAIR) 250-50 MCG/DOSE AEPB, Inhale 1 puff into the lungs daily., Disp: 60 each, Rfl: 0 .  omeprazole (PRILOSEC) 40 MG capsule, Take 1 capsule (40 mg total) by mouth 2 (two) times daily., Disp: 90 capsule, Rfl: 3 .  amiodarone (PACERONE) 200 MG tablet, Take 2 tablets (400 mg total) twice a day for 2 weeks, then take 1 tablet (200 mg total) twice a day for 2 weeks, then take 1 tab once daily (Patient not taking: Reported on 09/21/2018), Disp: 84 tablet, Rfl: 0 .  amiodarone (PACERONE)  200 MG tablet, Take 1 tablet (200 mg total) by mouth daily. (Patient not taking: Reported on 09/21/2018), Disp: 90 tablet, Rfl: 1 .  omeprazole (PRILOSEC) 40 MG capsule, Take 1 capsule (40 mg total) by mouth daily. (Patient not taking: Reported on 09/21/2018), Disp: 90 capsule, Rfl: 3 .  ondansetron (ZOFRAN ODT) 4 MG disintegrating tablet, 4mg ODT q4 hours prn nausea/vomit (Patient not taking: Reported on 09/21/2018), Disp: 12 tablet, Rfl: 0 .  sucralfate (CARAFATE) 1 g tablet, Take 1 tablet (1 g total) by mouth 4 (four) times daily -  with meals and at bedtime. (Patient not taking: Reported on 09/21/2018), Disp: 120 tablet, Rfl: 1  Current Facility-Administered Medications:  .  0.9 %  sodium chloride infusion, 500 mL, Intravenous, Once, Armbruster, Kalani P, MD   Bradley L Icard, DO [facility] 09/21/2018 1:03 PM   

## 2018-09-21 ENCOUNTER — Other Ambulatory Visit (INDEPENDENT_AMBULATORY_CARE_PROVIDER_SITE_OTHER): Payer: Medicare Other

## 2018-09-21 ENCOUNTER — Ambulatory Visit (INDEPENDENT_AMBULATORY_CARE_PROVIDER_SITE_OTHER): Payer: Medicare Other | Admitting: Pulmonary Disease

## 2018-09-21 ENCOUNTER — Encounter: Payer: Self-pay | Admitting: Pulmonary Disease

## 2018-09-21 DIAGNOSIS — R042 Hemoptysis: Secondary | ICD-10-CM | POA: Diagnosis not present

## 2018-09-21 DIAGNOSIS — Z23 Encounter for immunization: Secondary | ICD-10-CM | POA: Diagnosis not present

## 2018-09-21 LAB — URINALYSIS
Bilirubin Urine: NEGATIVE
KETONES UR: NEGATIVE
Leukocytes, UA: NEGATIVE
Nitrite: NEGATIVE
PH: 6.5 (ref 5.0–8.0)
Specific Gravity, Urine: <=1.005 — AB (ref 1.000–1.030)
Total Protein, Urine: NEGATIVE
URINE GLUCOSE: NEGATIVE
Urobilinogen, UA: 0.2 (ref 0.0–1.0)

## 2018-09-21 LAB — BASIC METABOLIC PANEL
BUN: 15 mg/dL (ref 6–23)
CO2: 27 mEq/L (ref 19–32)
Calcium: 8.9 mg/dL (ref 8.4–10.5)
Chloride: 105 mEq/L (ref 96–112)
Creatinine, Ser: 0.99 mg/dL (ref 0.40–1.50)
GFR: 82.47 mL/min (ref >60.00)
GLUCOSE: 106 mg/dL — AB (ref 70–99)
POTASSIUM: 4 meq/L (ref 3.5–5.1)
Sodium: 139 mEq/L (ref 135–145)

## 2018-09-21 LAB — C-REACTIVE PROTEIN: CRP: 0.6 mg/dL (ref 0.5–20.0)

## 2018-09-21 LAB — SEDIMENTATION RATE: Sed Rate: 20 mm/hr (ref 0–20)

## 2018-09-21 NOTE — Patient Instructions (Addendum)
We will plan to set you up for outpatient pulmonary function tests as well as a bronchoscopy for airway inspection.  We will also send you to the lab today for blood work.  Additionally given flu shot today in the office. Return to clinic following bronchoscopy.

## 2018-09-22 ENCOUNTER — Ambulatory Visit (INDEPENDENT_AMBULATORY_CARE_PROVIDER_SITE_OTHER): Payer: Medicare Other | Admitting: Pulmonary Disease

## 2018-09-22 ENCOUNTER — Ambulatory Visit (INDEPENDENT_AMBULATORY_CARE_PROVIDER_SITE_OTHER)
Admission: RE | Admit: 2018-09-22 | Discharge: 2018-09-22 | Disposition: A | Discharge status: Home / Self Care | Payer: Medicare Other | Source: Ambulatory Visit | Attending: Pulmonary Disease | Admitting: Pulmonary Disease

## 2018-09-22 VITALS — BP 126/74 | HR 66 | Resp 17

## 2018-09-22 DIAGNOSIS — R079 Chest pain, unspecified: Secondary | ICD-10-CM | POA: Diagnosis not present

## 2018-09-22 DIAGNOSIS — R042 Hemoptysis: Secondary | ICD-10-CM

## 2018-09-22 LAB — PULMONARY FUNCTION TEST
FEF 25-75 Pre: 4.27 L/sec
FEF2575-%PRED-PRE: 139 %
FEV1-%PRED-PRE: 90 %
FEV1-Pre: 3.32 L
FEV1FVC-%Pred-Pre: 112 %
FEV6-%Pred-Pre: 84 %
FEV6-PRE: 3.88 L
FEV6FVC-%Pred-Pre: 105 %
FVC-%Pred-Pre: 80 %
FVC-Pre: 3.88 L
PRE FEV1/FVC RATIO: 85 %
Pre FEV6/FVC Ratio: 100 %

## 2018-09-22 LAB — ANCA SCREEN W REFLEX TITER: ANCA SCREEN: NEGATIVE

## 2018-09-22 LAB — GLOMERULAR BASEMENT MEMBRANE ANTIBODIES

## 2018-09-22 LAB — ANA W/REFLEX: ANA: NEGATIVE

## 2018-09-22 NOTE — Progress Notes (Signed)
Synopsis: Referred in September 2019 for hemoptysis by Maryan Char, NP  Subjective:   PATIENT ID: Nathaniel [name] GENDER: male DOB: 11-30-1961, MRN: [id number]  Chief Complaint  Patient presents with  . Acute Visit    developed chest pain while doing PFT    Patient was initially seen in July 2019 in the emergency room with complaints of coughing up blood.  This is presumed related to hematemesis and a prior history of Barrett's esophagus.  Patient was treated as acute gastritis with outpatient follow-up EGD.  EGD was completed August 7 with revealed small area of Barrett's.  At that time the decision was made for referral to pulmonary for evaluation hemoptysis.  Chest imaging completed at his evaluation in the ED in July 2019 revealed no acute cardiopulmonary process however visible implanted cardiac loop monitor.  He has been to see by 4 different doctors over the past 1.5 years. He has 3 endoscopies for additional evaluations, some being for follow up on his barratts. The prior has found barratts esophagus again and no identified source of hemoptysis. Sometimes if he coughs really hard he will have blood mucus.  Coughing up of blood usually occurs in the middle of the night when he wakes up to go to the restroom.  He feels as if he has congestion within the left chest and after several bouts of coughing he will bring up and expectorate mucus that contains bright red and sometimes dark flecks of blood.  He is a life long non-smoker. Please see the picture below that gives a visual to the amount of blood coughed up.   Patient denies family history of chronic lung disease.  He does a significant family history for coronary disease as his father has had a heart attack and he has lost his brothers at young age related to cardiovascular disease.  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  OV 09/22/2018: Presents today in the clinic  following PFTs after developing left-sided chest pain.  Patient was brought into the exam room for evaluation after not being able to tolerate the PFTs.  His chest pain resolved after that.  He describes the pain is having congestion within the left chest and not really dullness or any radiating pain.  Patient did cough up blood again early this morning around 4 AM.      Past Medical History:  Diagnosis Date  . Allergy   . Arthritis    right knee  . Asthma    prn inhaler  . Barrett's esophagus 10-25-14 pt denies   states "minor"  . Chest pain    a. 06/2016 MV: EF 52%, no ischemia or infarct.  . 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 Deaf    Call [phone] for patient's sign language interpreter.   Marland Kitchen GERD (gastroesophageal reflux disease)   . Heartburn    occasional, related to certain foods  . Hemoptysis 09/20/2018  . High cholesterol   . Intermittent palpitations 10-25-14 patient denies   a. Event Monitor 04/2014: Mostly NSR - intermittent PACs, - 2 brief runs of PAT/PSVT  . PSVT (paroxysmal supraventricular tachycardia) ([place])    a. 04/2014 Event Monitor: brief PAT/PSVT-->uses prn flecainide.  . Seasonal allergies   . Stroke [facility]) 2013   TIA  . TIA (transient ischemic attack) 10-25-14 pt denies   no current deficits  . Vasovagal syncope      Family History  Problem  Relation Age of Onset  . Ovarian cancer Mother   . Coronary artery disease Father   . Heart attack Father 67  . Heart attack Brother 12  . Heart attack Paternal Grandfather 46  . Colon cancer Neg Hx   . Stomach cancer Neg Hx   . Esophageal cancer Neg Hx   . Rectal cancer Neg Hx      Past Surgical History:  Procedure Laterality Date  . [place]  08/2015   Ranging from mostly sinus rhythm to occasional sinus bradycardia and sinus tachycardia. Heart rate from 53 bpm to 129 bpm. No documented PVCs or PACs noted. No arrhythmia noted. No PSVT or A. Fib. "Heart racing" noted with sinus  tachycardia.  . Abdominal Aortic Ultrasound  2012   normal abdominal aorta  . BUNIONECTOMY Left 2012   right done also  . CARDIAC CATHETERIZATION  06/24/2011   wwidely patent normal coronary arteries with normal ejection fraction  . Carotid Artery Dopplers  2012   tortuous but no stenoses.. No subclavian disease  . COLONOSCOPY  2015  . COLONOSCOPY WITH PROPOFOL N/A 11/03/2014   Procedure: COLONOSCOPY WITH PROPOFOL;  Surgeon: Milus Banister, MD;  Location: [facility];  Service: Endoscopy;  Laterality: N/A;  . Foot Implant Removal Right 07/15/2013   @ [facility]  . INGUINAL HERNIA REPAIR    . KNEE ARTHROSCOPY Left 06/02/2012  . KNEE ARTHROSCOPY Right fall 2013   x 2  . KNEE ARTHROSCOPY Left 03/03/2013   Procedure: ARTHROSCOPY KNEE WITH CHONDROPLASTY PATELLA  AND LATERAL TIBIAL PLATEAU;  Surgeon: Kerin Salen, MD;  Location: [facility];  Service: Orthopedics;  Laterality: Left;  . LOOP RECORDER INSERTION N/A 02/26/2018   Procedure: LOOP RECORDER INSERTION;  Surgeon: Constance Haw, MD;  Location: [facility];  Service: Cardiovascular;  Laterality: N/A;  . NM MYOVIEW LTD  06/2016   No evidence of ischemia or infarction.   Domingo Dimes ECHOCARDIOGRAM  June 2015, August 2018   A) normal EF: 55-60%. No wall motion abnormalities. Gr 1 DD.  Mild MR. Otherwise normal;; b) ([facility]) - Normal LV size and function.  EF 60-65%.  No significant valvular abnormalities.  Marland Kitchen Upper and Lower Extremity Arterial Dopplers  2012   normal arterial flow bilateral upper extremities including subclavian is a carotid;;R. ABI 1.2, L. ABI 1.28. Normal flow velocities. Likely calcified vessels.  Marland Kitchen UPPER GASTROINTESTINAL ENDOSCOPY  2013    Social History   Socioeconomic History  . Marital status: Married    Spouse name: Not on file  . Number of children: 1  . Years of education: college  . Highest education level: Not on file  Occupational History  . Occupation: disability  Social Needs    . Financial resource strain: Not on file  . Food insecurity:    Worry: Not on file    Inability: Not on file  . Transportation needs:    Medical: Not on file    Non-medical: Not on file  Tobacco Use  . Smoking status: Never Smoker  . Smokeless tobacco: Never Used  Substance and Sexual Activity  . Alcohol use: Never    Alcohol/week: 0.0 standard drinks    Frequency: Never    Comment: occ  . Drug use: No  . Sexual activity: Not on file  Lifestyle  . Physical activity:    Days per week: Not on file    Minutes per session: Not on file  . Stress: Not on  file  Relationships  . Social connections:    Talks on phone: Not on file    Gets together: Not on file    Attends religious service: Not on file    Active member of club or organization: Not on file    Attends meetings of clubs or organizations: Not on file    Relationship status: Not on file  . Intimate partner violence:    Fear of current or ex partner: Not on file    Emotionally abused: Not on file    Physically abused: Not on file    Forced sexual activity: Not on file  Other Topics Concern  . Not on file  Social History Narrative   He is deaf, requiring an interpreter.   He started a new job back in 2013, which was much less stressful for him.  He has subsequently gone on to disability.   He is a married father of one. He previously wrote his bike 2 times a week.   Does not smoke or drink.           Allergies  Allergen Reactions  . Prednisone Palpitations  . Shellfish-Derived Products Itching and Rash  . Shrimp [Shellfish Allergy] Rash and Itching  . Adhesive [Tape] Itching  . Simvastatin Other (See Comments)    DEPRESSION, VISUAL DISTURBANCE(FLASHING LIGHTS)  . Tamsulosin Other (See Comments)    Other reaction(s): Confusion Other reaction(s): Other (See Comments) Changes in behavior  Changes in behavior  Changes in behavior   . Simvastatin - High Dose  [Simvastatin-High Dose]     Depression, behavioral  issues  . Fish Allergy Rash and Other (See Comments)    SOFTSHELL FISH  . Fish-Derived Products Rash and Other (See Comments)    SOFTSHELL FISH SOFTSHELL [place]  . Isovue [Iopamidol] Anxiety    Pt c/o chest tightness post injection, respiratory rate and o2 sats normal, evaluated by ED RN and respiratory and PA, given IV benadryl Pt states he does NOT want contrast in the future     Outpatient Medications Prior to Visit  Medication Sig Dispense Refill  . albuterol (PROVENTIL) (2.5 MG/3ML) 0.083% nebulizer solution Take 3 mLs (2.5 mg total) by nebulization every 6 (six) hours as needed for wheezing or shortness of breath. 75 mL 12  . amiodarone (PACERONE) 200 MG tablet Take 2 tablets (400 mg total) twice a day for 2 weeks, then take 1 tablet (200 mg total) twice a day for 2 weeks, then take 1 tab once daily (Patient not taking: Reported on 09/21/2018) 84 tablet 0  . amiodarone (PACERONE) 200 MG tablet Take 1 tablet (200 mg total) by mouth daily. (Patient not taking: Reported on 09/21/2018) 90 tablet 1  . aspirin EC 81 MG EC tablet Take 1 tablet (81 mg total) by mouth daily. 30 tablet 2  . fluticasone (FLONASE) 50 MCG/ACT nasal spray Place 2 sprays into both nostrils daily.    . Fluticasone-Salmeterol (ADVAIR) 250-50 MCG/DOSE AEPB Inhale 1 puff into the lungs daily. 60 each 0  . omeprazole (PRILOSEC) 40 MG capsule Take 1 capsule (40 mg total) by mouth 2 (two) times daily. 90 capsule 3  . omeprazole (PRILOSEC) 40 MG capsule Take 1 capsule (40 mg total) by mouth daily. (Patient not taking: Reported on 09/21/2018) 90 capsule 3  . ondansetron (ZOFRAN ODT) 4 MG disintegrating tablet 4mg  ODT q4 hours prn nausea/vomit (Patient not taking: Reported on 09/21/2018) 12 tablet 0  . sucralfate (CARAFATE) 1 g tablet Take 1 tablet (1  g total) by mouth 4 (four) times daily -  with meals and at bedtime. (Patient not taking: Reported on 09/21/2018) 120 tablet 1   Facility-Administered Medications Prior to Visit    Medication Dose Route Frequency Provider Last Rate Last Dose  . 0.9 %  sodium chloride infusion  500 mL Intravenous Once Armbruster, Carlota Raspberry, MD        Review of Systems  Constitutional: Negative.  Negative for chills, fever, malaise/fatigue and weight loss.  HENT: Positive for hearing loss. Negative for ear discharge, ear pain, nosebleeds and tinnitus.   Eyes: Negative for blurred vision and double vision.  Respiratory: Positive for cough, hemoptysis and sputum production. Negative for shortness of breath and wheezing.   Cardiovascular: Positive for chest pain. Negative for palpitations and leg swelling.  Gastrointestinal: Negative for abdominal pain, diarrhea, heartburn, nausea and vomiting.  Genitourinary: Negative for dysuria, frequency and urgency.  Musculoskeletal: Negative for back pain, myalgias and neck pain.  Skin: Negative for itching.  Neurological: Negative for tingling, tremors and headaches.  Endo/Heme/Allergies: Negative.   Psychiatric/Behavioral: Negative for depression and substance abuse. The patient is not nervous/anxious.     Objective:  Physical Exam  Constitutional: He is oriented to person, place, and time. He appears well-developed and well-nourished. No distress.  HENT:  Wearing glasses  Cardiovascular: Normal rate, regular rhythm, normal heart sounds and intact distal pulses.  No murmur heard. Pulmonary/Chest: No accessory muscle usage. No tachypnea. No respiratory distress. He has no wheezes. He has no rhonchi.  Anterior rhonchi on left   Neurological: He is alert and oriented to person, place, and time.  Skin: Skin is warm and dry. Capillary refill takes less than 2 seconds. No rash noted.  Psychiatric: He has a normal mood and affect. His behavior is normal.  Vitals reviewed.    Vitals:   09/22/18 0959  BP: 126/74  Pulse: 66  Resp: 17  SpO2: 97%   97% on RA BMI Readings from Last 3 Encounters:  09/21/18 25.27 kg/m  08/10/18 25.41 kg/m   08/05/18 25.38 kg/m   Wt Readings from Last 3 Encounters:  09/21/18 181 lb 3.2 oz (82.2 kg)  08/10/18 182 lb 3.2 oz (82.6 kg)  08/05/18 182 lb (82.6 kg)    CBC    Component Value Date/Time   WBC 5.8 07/31/2018 1556   RBC 4.23 07/31/2018 1556   HGB 13.0 07/31/2018 1556   HCT 38.6 (L) 07/31/2018 1556   PLT 223.0 07/31/2018 1556   MCV 91.4 07/31/2018 1556   MCH 30.3 07/24/2018 1136   MCHC 33.5 07/31/2018 1556   RDW 13.7 07/31/2018 1556   LYMPHSABS 1.7 07/31/2018 1556   MONOABS 0.4 07/31/2018 1556   EOSABS 0.2 07/31/2018 1556   BASOSABS 0.0 07/31/2018 1556    Chest Imaging: Chest x-ray from July 2019: No acute cardiopulmonary process, no infiltrate  CT chest February 2019: No visible abnormality within the trachea or bronchus, no parenchymal abnormalities except for small areas of groundglass consistent with possible atelectasis  Pulmonary Functions Testing Results: Pending   FeNO: None   Pathology: None   Echocardiogram: None   09/03/2018: - Left ventricle: The cavity size was normal. Systolic function was   normal. The estimated ejection fraction was in the range of 55%   to 60%. There is hypokinesis of the apicalanteroseptal   myocardium. Doppler parameters are consistent with abnormal left   ventricular relaxation (grade 1 diastolic dysfunction). - Mitral valve: There was trivial regurgitation. - Tricuspid valve: There  was mild regurgitation. - Pulmonic valve: There was trivial regurgitation.  Heart Catheterization: None     Assessment & Plan:   Chest pain, unspecified type - Plan: EKG 12-Lead, DG Chest 2 View  Left-sided chest pain and dullness with attempts at pulmonary function testing.  Patient describes a congestion feeling within the left chest causing him to cough.  No radiating pain, no dullness. Vitals stable   Hemoptysis, recurrent   Discussion:  Twelve-lead ECG completed in the office today.  Normal sinus rhythm, no ST segment changes.  Patient  no longer having left-sided chest discomfort.  Will obtain 2 view chest x-ray  Patient should go to the emergency room for evaluation if continued chest pain and or any change in symptoms.  Bronchoscopy planned for next Wednesday.  Labs collected yesterday, pending    Current Outpatient Medications:  .  albuterol (PROVENTIL) (2.5 MG/3ML) 0.083% nebulizer solution, Take 3 mLs (2.5 mg total) by nebulization every 6 (six) hours as needed for wheezing or shortness of breath., Disp: 75 mL, Rfl: 12 .  amiodarone (PACERONE) 200 MG tablet, Take 2 tablets (400 mg total) twice a day for 2 weeks, then take 1 tablet (200 mg total) twice a day for 2 weeks, then take 1 tab once daily (Patient not taking: Reported on 09/21/2018), Disp: 84 tablet, Rfl: 0 .  amiodarone (PACERONE) 200 MG tablet, Take 1 tablet (200 mg total) by mouth daily. (Patient not taking: Reported on 09/21/2018), Disp: 90 tablet, Rfl: 1 .  aspirin EC 81 MG EC tablet, Take 1 tablet (81 mg total) by mouth daily., Disp: 30 tablet, Rfl: 2 .  fluticasone (FLONASE) 50 MCG/ACT nasal spray, Place 2 sprays into both nostrils daily., Disp: , Rfl:  .  Fluticasone-Salmeterol (ADVAIR) 250-50 MCG/DOSE AEPB, Inhale 1 puff into the lungs daily., Disp: 60 each, Rfl: 0 .  omeprazole (PRILOSEC) 40 MG capsule, Take 1 capsule (40 mg total) by mouth 2 (two) times daily., Disp: 90 capsule, Rfl: 3 .  omeprazole (PRILOSEC) 40 MG capsule, Take 1 capsule (40 mg total) by mouth daily. (Patient not taking: Reported on 09/21/2018), Disp: 90 capsule, Rfl: 3 .  ondansetron (ZOFRAN ODT) 4 MG disintegrating tablet, 4mg  ODT q4 hours prn nausea/vomit (Patient not taking: Reported on 09/21/2018), Disp: 12 tablet, Rfl: 0 .  sucralfate (CARAFATE) 1 g tablet, Take 1 tablet (1 g total) by mouth 4 (four) times daily -  with meals and at bedtime. (Patient not taking: Reported on 09/21/2018), Disp: 120 tablet, Rfl: 1  Current Facility-Administered Medications:  .  0.9 %  sodium  chloride infusion, 500 mL, Intravenous, Once, Armbruster, Carlota Raspberry, MD   [facility] 09/22/2018 10:00 AM

## 2018-09-22 NOTE — Patient Instructions (Signed)
CXR today  Bronchoscopy next week on Wednesday.

## 2018-09-22 NOTE — Progress Notes (Signed)
Patient attempted PFT today, tried three times on pre spiro and could not continue due to left lung pain. Pt felt he had fluid in his lung and was having hard time breathing.  Spoke with Dr. Valeta Harms MD regarding this situation and we stopped the test, BI requested for pt to be added to his schedule. Pt was taken to [facility] room for EKG stat and CXR today.

## 2018-09-27 LAB — CUP PACEART REMOTE DEVICE CHECK
Implantable Pulse Generator Implant Date: 20190228
MDC IDC SESS DTM: 20190914220528

## 2018-09-28 ENCOUNTER — Encounter (HOSPITAL_COMMUNITY): Payer: Self-pay

## 2018-09-30 ENCOUNTER — Encounter (HOSPITAL_COMMUNITY)
Admission: RE | Disposition: A | Discharge status: Home / Self Care | Payer: Self-pay | Source: Ambulatory Visit | Attending: Pulmonary Disease

## 2018-09-30 ENCOUNTER — Ambulatory Visit (HOSPITAL_COMMUNITY)
Admission: RE | Admit: 2018-09-30 | Discharge: 2018-09-30 | Disposition: A | Discharge status: Home / Self Care | Payer: Medicare Other | Source: Ambulatory Visit | Attending: Pulmonary Disease | Admitting: Pulmonary Disease

## 2018-09-30 DIAGNOSIS — I471 Supraventricular tachycardia: Secondary | ICD-10-CM | POA: Insufficient documentation

## 2018-09-30 DIAGNOSIS — Z9889 Other specified postprocedural states: Secondary | ICD-10-CM

## 2018-09-30 DIAGNOSIS — J45909 Unspecified asthma, uncomplicated: Secondary | ICD-10-CM | POA: Insufficient documentation

## 2018-09-30 DIAGNOSIS — K219 Gastro-esophageal reflux disease without esophagitis: Secondary | ICD-10-CM | POA: Insufficient documentation

## 2018-09-30 DIAGNOSIS — Z7951 Long term (current) use of inhaled steroids: Secondary | ICD-10-CM | POA: Diagnosis not present

## 2018-09-30 DIAGNOSIS — J40 Bronchitis, not specified as acute or chronic: Secondary | ICD-10-CM | POA: Diagnosis not present

## 2018-09-30 DIAGNOSIS — Z23 Encounter for immunization: Secondary | ICD-10-CM | POA: Insufficient documentation

## 2018-09-30 DIAGNOSIS — E7800 Pure hypercholesterolemia, unspecified: Secondary | ICD-10-CM | POA: Diagnosis not present

## 2018-09-30 DIAGNOSIS — M1990 Unspecified osteoarthritis, unspecified site: Secondary | ICD-10-CM | POA: Diagnosis not present

## 2018-09-30 DIAGNOSIS — R042 Hemoptysis: Secondary | ICD-10-CM | POA: Insufficient documentation

## 2018-09-30 DIAGNOSIS — Z7982 Long term (current) use of aspirin: Secondary | ICD-10-CM | POA: Diagnosis not present

## 2018-09-30 DIAGNOSIS — Z8673 Personal history of transient ischemic attack (TIA), and cerebral infarction without residual deficits: Secondary | ICD-10-CM | POA: Diagnosis not present

## 2018-09-30 HISTORY — PX: VIDEO BRONCHOSCOPY: SHX5072

## 2018-09-30 LAB — BODY FLUID CELL COUNT WITH DIFFERENTIAL
EOS FL: 6 %
Lymphs, Fluid: 31 %
MONOCYTE-MACROPHAGE-SEROUS FLUID: 5 % — AB (ref 50–90)
NEUTROPHIL FLUID: 58 % — AB (ref 0–25)
WBC FLUID: 42 uL (ref 0–1000)

## 2018-09-30 SURGERY — BRONCHOSCOPY, WITH FLUOROSCOPY
Anesthesia: Moderate Sedation | Laterality: Bilateral

## 2018-09-30 MED ORDER — FENTANYL CITRATE (PF) 100 MCG/2ML IJ SOLN
INTRAMUSCULAR | Status: DC | PRN
  Administered 2018-09-30 (×2): 50 ug via INTRAVENOUS

## 2018-09-30 MED ORDER — FENTANYL CITRATE (PF) 100 MCG/2ML IJ SOLN
INTRAMUSCULAR | Status: AC
  Filled 2018-09-30: qty 4

## 2018-09-30 MED ORDER — MIDAZOLAM HCL 5 MG/ML IJ SOLN
INTRAMUSCULAR | Status: AC
  Filled 2018-09-30: qty 2

## 2018-09-30 MED ORDER — PHENYLEPHRINE HCL 0.25 % NA SOLN
NASAL | Status: DC | PRN
  Administered 2018-09-30: 2 via NASAL

## 2018-09-30 MED ORDER — LIDOCAINE HCL (PF) 1 % IJ SOLN
INTRAMUSCULAR | Status: DC | PRN
  Administered 2018-09-30: 6 mL

## 2018-09-30 MED ORDER — MIDAZOLAM HCL 10 MG/2ML IJ SOLN
INTRAMUSCULAR | Status: DC | PRN
  Administered 2018-09-30: 1 mg via INTRAVENOUS
  Administered 2018-09-30 (×2): 2 mg via INTRAVENOUS

## 2018-09-30 MED ORDER — PREDNISONE 10 MG PO TABS: ORAL_TABLET | ORAL | 0 refills | Status: DC

## 2018-09-30 MED ORDER — SODIUM CHLORIDE 0.9 % IV SOLN
INTRAVENOUS | Status: DC
  Administered 2018-09-30: 13:00:00 via INTRAVENOUS

## 2018-09-30 MED ORDER — LIDOCAINE HCL URETHRAL/MUCOSAL 2 % EX GEL
CUTANEOUS | Status: DC | PRN
  Administered 2018-09-30: 1

## 2018-09-30 MED ORDER — PHENYLEPHRINE HCL 0.25 % NA SOLN: 1.0000 | Freq: Four times a day (QID) | NASAL | Status: DC | PRN

## 2018-09-30 MED ORDER — LIDOCAINE HCL 2 % EX GEL
1.0000 "application " | Freq: Once | CUTANEOUS | Status: DC
  Filled 2018-09-30: qty 5

## 2018-09-30 NOTE — Progress Notes (Signed)
   Post-Bronchoscopy/Post-Anesthesia Evaluation:  Subjective: This is a 57 y.o., male s/p bronchoscopy on 09/30/2018 for flexible bronchoscopy.   Objective: BP 134/81   Resp 17   SpO2 95%    Physical Examination: Gen: NAD, resting comfortably  Cv: RRR, S1, S2 Resp: CTAB, no crackles, no wheeze  Abd: Soft, NT ND  Assessment: Stable post-moderate sedation anesthesia S/p Flexible bronchoscopy   Significant inflammatory bronchitis  Small nodules along the anterior left main stem   Plan: Stable for discharge once fully recovered Advance diet as tolerated Prednisone taper send to local [place], DO [facility] 09/30/2018 2:42 PM  Personal pager: [phone] If unanswered, please page CCM On-call: [phone]

## 2018-09-30 NOTE — Interval H&P Note (Signed)
History and Physical Interval Note:  09/30/2018 12:58 PM  Nathaniel [name]  has presented today for surgery, with the diagnosis of Hemoptysis  The various methods of treatment have been discussed with the patient and family. After consideration of risks, benefits and other options for treatment, the patient has consented to  Procedure(s): VIDEO BRONCHOSCOPY WITH FLUORO (Bilateral) as a surgical intervention .  The patient's history has been reviewed, patient examined, no change in status, stable for surgery.  I have reviewed the patient's chart and labs.  Questions were answered to the patient's satisfaction.    Discussed again with the patient, wife present at bedside, along with ASL interpreter his plans for bronchoscopy. The patient understands the risks of the bronchoscopy as well as conscious sedation needed for the procedure. There are no barriers to proceed.   We will plan for flexible bronchoscopy for airway inspection with BAL. Depending on the BAL specimen we will consider transbronchial biopsies unless we find a definite endobronchial lesion or cause of hemoptysis.   Garner Nash, DO [facility] 09/30/2018 1:00 PM  Personal pager: [phone] If unanswered, please page CCM On-call: [phone]

## 2018-09-30 NOTE — Progress Notes (Signed)
Video bronchoscopy performed Intervention bronchial washings Intervention bronchial brushings Intervention bronchial biopsy Patient tolerated well  Kathie Dike RRT

## 2018-09-30 NOTE — Op Note (Signed)
Video Bronchoscopy Procedure Note  Date of Operation: 09/30/2018  Pre-op Diagnosis: Hemoptysis   Post-op Diagnosis: Hemoptysis   Surgeon: Garner Nash, DO    Assistants: none  Anesthesia: conscious sedation, moderate sedation, Time 1310 - 1338, 28 minutes total  Meds Given: fentanyl 1108mg, versed 523min divided doses, 1% lidocaine 8 cc total  Operation: Flexible video fiberoptic bronchoscopy and biopsies.  Estimated Blood Loss: <1 cc  Complications: none noted  Indications and History: Nathaniel SCHNAPPs [age].o. with history of recurrent persistent hemoptysis.  Recommendation was to perform video fiberoptic bronchoscopy with airway inspection. The risks, benefits, complications, treatment options and expected outcomes were discussed with the patient.  The possibilities of pneumothorax, pneumonia, reaction to medication, pulmonary aspiration, perforation of a viscus, bleeding, failure to diagnose a condition and creating a complication requiring transfusion or operation were discussed with the patient who freely signed the consent.    Description of Procedure: The patient was seen in the Preoperative Area, was examined and was deemed appropriate to proceed.  [facility], identified as Nathaniel GORTERnd the procedure verified as Flexible Video Fiberoptic Bronchoscopy.  A Time Out was held and the above information confirmed.   Conscious sedation was initiated as indicated above. The video fiberoptic bronchoscope was attempted to be introduced via the R nare for inspection but the scope would not pass freely therefore the scope was passed via the L nare and inspection of the left appeared normal and the posterior pharynx appeared normal. A general inspection was performed which showed normal cords, normal trachea, normal main carina. The R sided airways were inspected and showed normal RUL, BI, RML and RLL. The L side was then inspected. The  LLL, Lingular, and LUL airways appeared normal. The Left main stem bronchus along the anterior lateral wall at approximately 10 to 11pm clock position there were two small nodules on the mucosa. Beyond this position there was distinctly friable mucosa which was hyperemic in appearance. Under NBI there was significant network of vasculature consistent with diffuse bronchitis and inflamed airways. There was no erosions. We used forceps to attempt to biopsy the nodular lesions along the anterior lateral wall this was no very successful due to the location and the forceps not grasping the tissue well. We then completed cytology brushings of this area. Following brushing we completed a BAL to the LUL with moderate return. The initial return was clear but due to the friable mucosa and scope trauma to the airway the second portion of the BAL was bloody. I believe this was from the airway and not from the alveolus. Finally endobronchial washings were performed in the LM to be sent for cytology. The patient tolerated the procedure well. The bronchoscope was removed. There were no obvious complications.   Samples: 1. Endobronchial forcep biopsies of the left main stem  2. Cytology brushings of the left main stem  3. BAL to the LUL, 120cc with moderate return 4. Bronchial washing of the LM   Plans:  We will review the cytology, pathology and microbiology results with the patient when they become available.  Outpatient followup will be with BrGarner NashDO.    BrGarner NashDO [facility] 09/30/2018 1:44 PM  Personal pager: [phone] unanswered, please page CCM On-call: #[phone]

## 2018-09-30 NOTE — Discharge Instructions (Signed)
Flexible Bronchoscopy, Care After These instructions give you information on caring for yourself after your procedure. Your doctor may also give you more specific instructions. Call your doctor if you have any problems or questions after your procedure. Follow these instructions at home:  Do not eat or drink anything for 2 hours after your procedure. If you try to eat or drink before the medicine wears off, food or drink could go into your lungs. You could also burn yourself.  After 2 hours have passed and when you can cough and gag normally, you may eat soft food and drink liquids slowly.  The day after the test, you may eat your normal diet.  You may do your normal activities.  Keep all doctor visits. Get help right away if:  You get more and more short of breath.  You get light-headed.  You feel like you are going to pass out (faint).  You have chest pain.  You have new problems that worry you.  You cough up more than a little blood.  You cough up more blood than before. This information is not intended to replace advice given to you by your health care provider. Make sure you discuss any questions you have with your health care provider. Document Released: 10/13/2009 Document Revised: 05/23/2016 Document Reviewed: 08/20/2013 Elsevier Interactive Patient Education  2017 [place] not eat or drink until after 3:30 today 09/30/18

## 2018-10-01 ENCOUNTER — Telehealth: Payer: Self-pay | Admitting: Pulmonary Disease

## 2018-10-01 ENCOUNTER — Encounter (HOSPITAL_COMMUNITY): Payer: Self-pay | Admitting: Pulmonary Disease

## 2018-10-01 NOTE — Telephone Encounter (Signed)
Dr. Valeta Harms I got an email from Mr. Nathaniel [name] stating   "[place],  Last night, coughs was very horrible with blood in all of night not stop. 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 My left lung still coughs with blood and awful pains now. Please send your email to me in asap. Nathaniel [name].  How would you like me to address this?

## 2018-10-01 NOTE — Telephone Encounter (Signed)
Called and spoke to Dr. Valeta Harms. Per BI patient needs to go to ER if he is coughing up lots of blood and still having continuous chest pain. If he feels like he is getting better then he can come in for a visit. Some coughing up of blood is expected after a Bronchoscopy.

## 2018-10-02 ENCOUNTER — Telehealth: Payer: Self-pay

## 2018-10-02 LAB — ACID FAST SMEAR (AFB, MYCOBACTERIA): Acid Fast Smear: NEGATIVE

## 2018-10-02 LAB — ACID FAST SMEAR (AFB)

## 2018-10-02 NOTE — Telephone Encounter (Signed)
Patient is unable to be reached via phone, all messages need to be sent through my chart. Patient has been emailed and informed that he needs to go to the emergency room if he is still coughing up blood. His emergency contact number is always busy when trying to call. Nothing further needed at this time.

## 2018-10-02 NOTE — Telephone Encounter (Signed)
Attempted to contact patient again for a wellness check. Emergency contact number remains busy and patient has not responded to his email.

## 2018-10-02 NOTE — Telephone Encounter (Signed)
Attempted to call patient. Unable to reach at this time. Will call back later today  Routing to Triage for follow up

## 2018-10-03 LAB — CULTURE, BAL-QUANTITATIVE W GRAM STAIN: Culture: 40000 — AB

## 2018-10-03 LAB — CULTURE, BAL-QUANTITATIVE

## 2018-10-09 ENCOUNTER — Ambulatory Visit (INDEPENDENT_AMBULATORY_CARE_PROVIDER_SITE_OTHER): Payer: Medicare Other | Admitting: Pulmonary Disease

## 2018-10-09 ENCOUNTER — Telehealth: Payer: Self-pay | Admitting: Pulmonary Disease

## 2018-10-09 ENCOUNTER — Other Ambulatory Visit (HOSPITAL_COMMUNITY): Payer: Self-pay | Admitting: Pulmonary Disease

## 2018-10-09 ENCOUNTER — Telehealth: Payer: Self-pay | Admitting: Cardiology

## 2018-10-09 ENCOUNTER — Encounter: Payer: Self-pay | Admitting: Pulmonary Disease

## 2018-10-09 VITALS — BP 120/88 | HR 83 | Ht 71.0 in | Wt 179.0 lb

## 2018-10-09 DIAGNOSIS — J42 Unspecified chronic bronchitis: Secondary | ICD-10-CM

## 2018-10-09 DIAGNOSIS — R1310 Dysphagia, unspecified: Secondary | ICD-10-CM

## 2018-10-09 DIAGNOSIS — R042 Hemoptysis: Secondary | ICD-10-CM | POA: Diagnosis not present

## 2018-10-09 MED ORDER — ALBUTEROL SULFATE (2.5 MG/3ML) 0.083% IN NEBU: 2.5000 mg | INHALATION_SOLUTION | Freq: Four times a day (QID) | RESPIRATORY_TRACT | 12 refills | Status: DC | PRN

## 2018-10-09 MED ORDER — AZITHROMYCIN 250 MG PO TABS: ORAL_TABLET | ORAL | 5 refills | Status: DC

## 2018-10-09 MED ORDER — AEROCHAMBER MV MISC: 0 refills | Status: AC

## 2018-10-09 MED ORDER — BUDESONIDE-FORMOTEROL FUMARATE 160-4.5 MCG/ACT IN AERO: 2.0000 | INHALATION_SPRAY | Freq: Two times a day (BID) | RESPIRATORY_TRACT | 6 refills | Status: DC

## 2018-10-09 NOTE — Telephone Encounter (Signed)
PCCM:  Per the documentation today.  The patient states he has not been taking the amiodarone that is why I prescribed the azithromycin today because I assumed he was not taking other QT prolonging medications.  His prior EKG had a QTC of 400 which is normal.  We need to clarify then if the patient had the amiodarone filled and if in fact he is taking the amiodarone.  If he is taking amiodarone then he does not need to take the azithromycin because of the drug interaction.   If he is not taking amiodarone then he can take the azithromycin we will follow-up his EKG on his next office visit to look at his QTC.  Garner Nash, DO [facility] 10/09/2018 2:21 PM

## 2018-10-09 NOTE — Telephone Encounter (Signed)
Left message with pt interpretor and left a my chart message for pt to let us know if he is still taking the Amiodarone.

## 2018-10-09 NOTE — Patient Instructions (Addendum)
Thank you for visiting Dr. Valeta Harms at [facility]. Today we recommend the following: Orders Placed This Encounter  Procedures  . DG Swallowing Func-Speech Pathology  . Ambulatory Referral for DME  . SLP modified barium swallow   Meds ordered this encounter  Medications  . budesonide-formoterol (SYMBICORT) 160-4.5 MCG/ACT inhaler    Sig: Inhale 2 puffs into the lungs every 12 (twelve) hours.    Dispense:  1 Inhaler    Refill:  6  . albuterol (PROVENTIL) (2.5 MG/3ML) 0.083% nebulizer solution    Sig: Take 3 mLs (2.5 mg total) by nebulization every 6 (six) hours as needed for wheezing or shortness of breath.    Dispense:  75 mL    Refill:  12  . azithromycin (ZITHROMAX) 250 MG tablet    Sig: Take 1 tablet Monday, Wednesday, and Friday,    Dispense:  12 tablet    Refill:  5   Return in about 4 weeks (around 11/06/2018).

## 2018-10-09 NOTE — Progress Notes (Signed)
Synopsis: Referred in September 2019 for hemoptysis by Maryan Char, NP  Subjective:   PATIENT ID: Nathaniel [name] GENDER: male DOB: [birth year], MRN: [id number]  Chief Complaint  Patient presents with  . Follow-up    He is having problems with his left lung pain, increased cough and mucous, mucous is brown, he is no longer able to sleep, increased fatigue, coughing is keeping him up at night.     Initial OV: Patient was initially seen in July 2019 in the emergency room with complaints of coughing up blood.  This is presumed related to hematemesis and a prior history of Barrett's esophagus.  Patient was treated as acute gastritis with outpatient follow-up EGD.  EGD was completed August 7 with revealed small area of Barrett's.  At that time the decision was made for referral to pulmonary for evaluation hemoptysis.  Chest imaging completed at his evaluation in the ED in July 2019 revealed no acute cardiopulmonary process however visible implanted cardiac loop monitor.  He has been to see by 4 different doctors over the past 1.5 years. He has 3 endoscopies for additional evaluations, some being for follow up on his barratts. The prior has found barratts esophagus again and no identified source of hemoptysis. Sometimes if he coughs really hard he will have blood mucus.  Coughing up of blood usually occurs in the middle of the night when he wakes up to go to the restroom.  He feels as if he has congestion within the left chest and after several bouts of coughing he will bring up and expectorate mucus that contains bright red and sometimes dark flecks of blood.  He is a life long non-smoker. Please see the picture below that gives a visual to the amount of blood coughed up.   Patient denies family history of chronic lung disease.  He does a significant family history for coronary disease as his father has had a heart attack and he has lost his brothers at young age related to cardiovascular disease.    OV 10/09/2018: Patient was taken for bronchoscopy which revealed diffuse distinctly friable mucosa that was hyperemic pink.  Under narrowband imaging there was an extensive network of vasculature suggestive of diffuse of bronchitis and inflamed airways.  There was no obvious evidence of erosion.  BAL cultures revealed normal flora and cytology was negative for malignancy.  Forcep biopsies of 2 small nodules of the anterior wall negative for malignancy.  He has had continued cough and sputum production since his bronchoscopy.  He states that he only has blood appear when he has extensive or violent coughing episodes.  His chest always feels tight and has associated wheezing.  He denies fevers, chills, night sweats.  He is tired and because he is unable to sleep at night due to cough. He does admit to coughing and choking on foods on occasion. He sleeps with 3 pillows.  Today we used the iPad interpreting system for ASL.       Past Medical History:  Diagnosis Date  . Allergy   . Arthritis    right knee  . Asthma    prn inhaler  . Barrett's esophagus 10-25-14 pt denies   states "minor"  . Chest pain    a. 06/2016 MV: EF 52%, no ischemia or infarct.  . 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 Deaf    Call [phone] for patient's sign language interpreter.   Marland Kitchen GERD (gastroesophageal reflux disease)   . Heartburn  occasional, related to certain foods  . Hemoptysis 09/20/2018  . High cholesterol   . Intermittent palpitations 10-25-14 patient denies   a. Event Monitor 04/2014: Mostly NSR - intermittent PACs, - 2 brief runs of PAT/PSVT  . PSVT (paroxysmal supraventricular tachycardia) ([place])    a. 04/2014 Event Monitor: brief PAT/PSVT-->uses prn flecainide.  . Seasonal allergies   . Stroke [facility]) 2013   TIA  . TIA (transient ischemic attack) 10-25-14 pt denies   no current deficits  . Vasovagal syncope      Family History  Problem Relation Age of Onset  . Ovarian cancer Mother   .  Coronary artery disease Father   . Heart attack Father 35  . Heart attack Brother 68  . Heart attack Paternal Grandfather 43  . Colon cancer Neg Hx   . Stomach cancer Neg Hx   . Esophageal cancer Neg Hx   . Rectal cancer Neg Hx      Past Surgical History:  Procedure Laterality Date  . [place]  08/2015   Ranging from mostly sinus rhythm to occasional sinus bradycardia and sinus tachycardia. Heart rate from 53 bpm to 129 bpm. No documented PVCs or PACs noted. No arrhythmia noted. No PSVT or A. Fib. "Heart racing" noted with sinus tachycardia.  . Abdominal Aortic Ultrasound  2012   normal abdominal aorta  . BUNIONECTOMY Left 2012   right done also  . CARDIAC CATHETERIZATION  06/24/2011   wwidely patent normal coronary arteries with normal ejection fraction  . Carotid Artery Dopplers  2012   tortuous but no stenoses.. No subclavian disease  . COLONOSCOPY  2015  . COLONOSCOPY WITH PROPOFOL N/A 11/03/2014   Procedure: COLONOSCOPY WITH PROPOFOL;  Surgeon: Milus Banister, MD;  Location: [facility];  Service: Endoscopy;  Laterality: N/A;  . Foot Implant Removal Right 07/15/2013   @ [place]  . INGUINAL HERNIA REPAIR    . KNEE ARTHROSCOPY Left 06/02/2012  . KNEE ARTHROSCOPY Right fall 2013   x 2  . KNEE ARTHROSCOPY Left 03/03/2013   Procedure: ARTHROSCOPY KNEE WITH CHONDROPLASTY PATELLA  AND LATERAL TIBIAL PLATEAU;  Surgeon: Kerin Salen, MD;  Location: [place];  Service: Orthopedics;  Laterality: Left;  . LOOP RECORDER INSERTION N/A 02/26/2018   Procedure: LOOP RECORDER INSERTION;  Surgeon: Constance Haw, MD;  Location: [place] [facility];  Service: Cardiovascular;  Laterality: N/A;  . NM MYOVIEW LTD  06/2016   No evidence of ischemia or infarction.   Domingo Dimes ECHOCARDIOGRAM  June 2015, August 2018   A) normal EF: 55-60%. No wall motion abnormalities. Gr 1 DD.  Mild MR. Otherwise normal;; b) ([facility]) - Normal LV size and function.  EF 60-65%.   No significant valvular abnormalities.  Marland Kitchen Upper and Lower Extremity Arterial Dopplers  2012   normal arterial flow bilateral upper extremities including subclavian is a carotid;;R. ABI 1.2, L. ABI 1.28. Normal flow velocities. Likely calcified vessels.  Marland Kitchen UPPER GASTROINTESTINAL ENDOSCOPY  2013  . VIDEO BRONCHOSCOPY Bilateral 09/30/2018   Procedure: VIDEO BRONCHOSCOPY WITH FLUORO;  Surgeon: Garner Nash, DO;  Location: [facility];  Service: Cardiopulmonary;  Laterality: Bilateral;    Social History   Socioeconomic History  . Marital status: Married    Spouse name: Not on file  . Number of children: 1  . Years of education: college  . Highest education level: Not on file  Occupational History  . Occupation: disability  Social Needs  . Financial  resource strain: Not on file  . Food insecurity:    Worry: Not on file    Inability: Not on file  . Transportation needs:    Medical: Not on file    Non-medical: Not on file  Tobacco Use  . Smoking status: Never Smoker  . Smokeless tobacco: Never Used  Substance and Sexual Activity  . Alcohol use: Never    Alcohol/week: 0.0 standard drinks    Frequency: Never    Comment: occ  . Drug use: No  . Sexual activity: Not on file  Lifestyle  . Physical activity:    Days per week: Not on file    Minutes per session: Not on file  . Stress: Not on file  Relationships  . Social connections:    Talks on phone: Not on file    Gets together: Not on file    Attends religious service: Not on file    Active member of club or organization: Not on file    Attends meetings of clubs or organizations: Not on file    Relationship status: Not on file  . Intimate partner violence:    Fear of current or ex partner: Not on file    Emotionally abused: Not on file    Physically abused: Not on file    Forced sexual activity: Not on file  Other Topics Concern  . Not on file  Social History Narrative   He is deaf, requiring an interpreter.   He  started a new job back in 2013, which was much less stressful for him.  He has subsequently gone on to disability.   He is a married father of one. He previously wrote his bike 2 times a week.   Does not smoke or drink.           Allergies  Allergen Reactions  . Prednisone Palpitations  . Shellfish-Derived Products Itching and Rash  . Shrimp [Shellfish Allergy] Rash and Itching  . Adhesive [Tape] Itching  . Simvastatin Other (See Comments)    DEPRESSION, VISUAL DISTURBANCE(FLASHING LIGHTS)  . Tamsulosin Other (See Comments)    Other reaction(s): Confusion Other reaction(s): Other (See Comments) Changes in behavior  Changes in behavior  Changes in behavior   . Simvastatin - High Dose  [Simvastatin-High Dose]     Depression, behavioral issues  . Fish Allergy Rash and Other (See Comments)    SOFTSHELL FISH  . Fish-Derived Products Rash and Other (See Comments)    SOFTSHELL FISH SOFTSHELL [place]  . Isovue [Iopamidol] Anxiety    Pt c/o chest tightness post injection, respiratory rate and o2 sats normal, evaluated by ED RN and respiratory and PA, given IV benadryl Pt states he does NOT want contrast in the future     Outpatient Medications Prior to Visit  Medication Sig Dispense Refill  . aspirin EC 81 MG EC tablet Take 1 tablet (81 mg total) by mouth daily. 30 tablet 2  . fluticasone (FLONASE) 50 MCG/ACT nasal spray Place 2 sprays into both nostrils daily.    . Fluticasone-Salmeterol (ADVAIR) 250-50 MCG/DOSE AEPB Inhale 1 puff into the lungs daily. 60 each 0  . omeprazole (PRILOSEC) 40 MG capsule Take 1 capsule (40 mg total) by mouth 2 (two) times daily. 90 capsule 3  . predniSONE (DELTASONE) 10 MG tablet Take 4 tabs by mouth once daily x4 days, then 3 tabs x4 days, 2 tabs x4 days, 1 tab x4 days and stop. 40 tablet 0  . albuterol (PROVENTIL) (2.5 MG/3ML)  0.083% nebulizer solution Take 3 mLs (2.5 mg total) by nebulization every 6 (six) hours as needed for wheezing or shortness of  breath. 75 mL 12   No facility-administered medications prior to visit.     Review of Systems  Constitutional: Negative for chills, fever, malaise/fatigue and weight loss.  HENT: Negative for hearing loss, sore throat and tinnitus.   Eyes: Negative for blurred vision and double vision.  Respiratory: Positive for cough, hemoptysis, sputum production, shortness of breath and wheezing. Negative for stridor.   Cardiovascular: Negative for chest pain, palpitations, orthopnea, leg swelling and PND.  Gastrointestinal: Negative for abdominal pain, constipation, diarrhea, heartburn, nausea and vomiting.  Genitourinary: Negative for dysuria, hematuria and urgency.  Musculoskeletal: Negative for joint pain and myalgias.  Skin: Negative for itching and rash.  Neurological: Negative for dizziness, tingling, weakness and headaches.  Endo/Heme/Allergies: Negative for environmental allergies. Does not bruise/bleed easily.  Psychiatric/Behavioral: Negative for depression. The patient is not nervous/anxious and does not have insomnia.   All other systems reviewed and are negative.   Objective:  Physical Exam  Constitutional: He is oriented to person, place, and time. He appears well-developed and well-nourished. No distress.  HENT:  Head: Normocephalic and atraumatic.  Mouth/Throat: Oropharynx is clear and moist.  Eyes: Pupils are equal, round, and reactive to light. Conjunctivae are normal. No scleral icterus.  Neck: Neck supple. No JVD present. No tracheal deviation present.  Cardiovascular: Normal rate, regular rhythm, normal heart sounds and intact distal pulses.  No murmur heard. Pulmonary/Chest: Effort normal and breath sounds normal. No accessory muscle usage or stridor. No tachypnea. No respiratory distress. He has no wheezes. He has no rhonchi. He has no rales.  Abdominal: Soft. Bowel sounds are normal. He exhibits no distension. There is no tenderness.  Musculoskeletal: He exhibits no edema  or tenderness.  Lymphadenopathy:    He has no cervical adenopathy.  Neurological: He is alert and oriented to person, place, and time.  Skin: Skin is warm and dry. Capillary refill takes less than 2 seconds. No rash noted.  Psychiatric: He has a normal mood and affect. His behavior is normal.  Vitals reviewed.    Vitals:   10/09/18 1135  BP: 120/88  Pulse: 83  SpO2: 98%  Weight: 179 lb (81.2 kg)  Height: _0  (1.803 m)   98% on RA BMI Readings from Last 3 Encounters:  10/09/18 24.97 kg/m  09/21/18 25.27 kg/m  08/10/18 25.41 kg/m   Wt Readings from Last 3 Encounters:  10/09/18 179 lb (81.2 kg)  09/21/18 181 lb 3.2 oz (82.2 kg)  08/10/18 182 lb 3.2 oz (82.6 kg)    CBC    Component Value Date/Time   WBC 5.8 07/31/2018 1556   RBC 4.23 07/31/2018 1556   HGB 13.0 07/31/2018 1556   HCT 38.6 (L) 07/31/2018 1556   PLT 223.0 07/31/2018 1556   MCV 91.4 07/31/2018 1556   MCH 30.3 07/24/2018 1136   MCHC 33.5 07/31/2018 1556   RDW 13.7 07/31/2018 1556   LYMPHSABS 1.7 07/31/2018 1556   MONOABS 0.4 07/31/2018 1556   EOSABS 0.2 07/31/2018 1556   BASOSABS 0.0 07/31/2018 1556    Chest Imaging: Chest x-ray from July 2019: No acute cardiopulmonary process, no infiltrate  CT chest February 2019: No visible abnormality within the trachea or bronchus, no parenchymal abnormalities except for small areas of groundglass consistent with possible atelectasis  Pulmonary Functions Testing Results: Pending   FeNO: None   Pathology: None   Echocardiogram: None  09/03/2018: - Left ventricle: The cavity size was normal. Systolic function was   normal. The estimated ejection fraction was in the range of 55%   to 60%. There is hypokinesis of the apicalanteroseptal   myocardium. Doppler parameters are consistent with abnormal left   ventricular relaxation (grade 1 diastolic dysfunction). - Mitral valve: There was trivial regurgitation. - Tricuspid valve: There was mild  regurgitation. - Pulmonic valve: There was trivial regurgitation.  Heart Catheterization: None     Assessment & Plan:   Chronic bronchitis, unspecified chronic bronchitis type ([place]) - Plan: budesonide-formoterol (SYMBICORT) 160-4.5 MCG/ACT inhaler, albuterol (PROVENTIL) (2.5 MG/3ML) 0.083% nebulizer solution, Ambulatory Referral for DME  Cough with hemoptysis  Discussion:   This is a 57 year old with evidence of chronic bronchitis.  I believe that his airways are so irritated and have been this way for such a long time that excessive coughing leads to mucosal disruption and rupturing of the submucosal vessels leading to bleeding within the bronchus that he eventually coughs up.  I do not believe that his hemoptysis will stop until we control the inflammation and cough.  As for investigations under to why the patient is having persistent cough.  We will send him for an MBSS for evaluation of aspiration into the airway.  Additionally I think we have to think about continuous gastroesophageal reflux as a possibility.  Plan: M BSS, SLP Symbicort 160, 2 puffs twice daily Give script for nebulizer and albuterol nebulized solution Complete prednisone taper he is currently taking Start azithromycin Monday Wednesday Friday 250 mg Recent EKG with normal QTC Repeat ECG after initiation of azithromycin Return to clinic in 4 weeks.  Greater than 50% of this patient's 40-minute office visit was spent face-to-face discussing the above treatment plan and diagnostics.     Current Outpatient Medications:  .  albuterol (PROVENTIL) (2.5 MG/3ML) 0.083% nebulizer solution, Take 3 mLs (2.5 mg total) by nebulization every 6 (six) hours as needed for wheezing or shortness of breath., Disp: 75 mL, Rfl: 12 .  aspirin EC 81 MG EC tablet, Take 1 tablet (81 mg total) by mouth daily., Disp: 30 tablet, Rfl: 2 .  fluticasone (FLONASE) 50 MCG/ACT nasal spray, Place 2 sprays into both nostrils daily., Disp: , Rfl:    .  Fluticasone-Salmeterol (ADVAIR) 250-50 MCG/DOSE AEPB, Inhale 1 puff into the lungs daily., Disp: 60 each, Rfl: 0 .  omeprazole (PRILOSEC) 40 MG capsule, Take 1 capsule (40 mg total) by mouth 2 (two) times daily., Disp: 90 capsule, Rfl: 3 .  predniSONE (DELTASONE) 10 MG tablet, Take 4 tabs by mouth once daily x4 days, then 3 tabs x4 days, 2 tabs x4 days, 1 tab x4 days and stop., Disp: 40 tablet, Rfl: 0 .  azithromycin (ZITHROMAX) 250 MG tablet, Take 1 tablet Monday, Wednesday, and Friday,, Disp: 12 tablet, Rfl: 5 .  budesonide-formoterol (SYMBICORT) 160-4.5 MCG/ACT inhaler, Inhale 2 puffs into the lungs every 12 (twelve) hours., Disp: 1 Inhaler, Rfl: Lucas, DO [facility] 10/09/2018 12:14 PM

## 2018-10-09 NOTE — Telephone Encounter (Signed)
Called pt's pharmacy and spoke with Estill Bamberg and stated to her the stated information from Dr. Valeta Harms  Per Estill Bamberg, pt just picked up a 90 day supply of the amiodarone 09/29/18. Per Estill Bamberg, when pt came to get the rescue inhaler and also the abx, a note was given to pt stating he could not do the amiodarone with the azithromycin and per Estill Bamberg, pt did not say anything to them about not taking the amiodarone (just nodded his head with acknowledgement).  Attempted to call pt but immediately got a recording that stated the person trying to reach was unavailable and the line dropped after that. Unable to leave a message on pt's phone.  With the information that I just found out from pharmacy, will route back to Dr. Valeta Harms.

## 2018-10-09 NOTE — Telephone Encounter (Signed)
Called pt's pharmacy and spoke with Estill Bamberg who stated there is a drug interaction between the azithromycin and the amniodarone.  Per Estill Bamberg, there is risk of QT prolongation if azithromycin and amniodarone are prescribed together and recommendation is not to have these two meds prescribed together.  Looked at pt's med list but we do not have it listed on pt's med list for the amniodarone.  Looked at pt's last cardiology visit with Dr. Curt Bears on 08/10/18 and saw the below stated:  START Amiodarone on Thursday, 8/15  -             Take 2 tablets (400 mg total) TWICE daily for 2 weeks, then             Take 1 tablet (200 mg total) TWICE daily for 2 weeks, then             Take 1 tablet (200 mg total) ONCE daily  Dr. Valeta Harms, please advise what med you want [place] to switch pt to instead of the azithromycin based on the above. Thanks!

## 2018-10-09 NOTE — Telephone Encounter (Signed)
New Message   Pt c/o medication issue:  1. Name of Medication: amiodarone  2. How are you currently taking this medication (dosage and times per day)?   3. Are you having a reaction (difficulty breathing--STAT)?   4. What is your medication issue? Pharmacy is calling to confirm wether or not this patient should be taking amiodarone. Please call to discuss.

## 2018-10-13 ENCOUNTER — Telehealth: Payer: Self-pay | Admitting: Cardiology

## 2018-10-13 ENCOUNTER — Ambulatory Visit (HOSPITAL_COMMUNITY)
Admission: RE | Admit: 2018-10-13 | Discharge: 2018-10-13 | Disposition: A | Discharge status: Home / Self Care | Payer: Medicare Other | Source: Ambulatory Visit | Attending: Pulmonary Disease | Admitting: Pulmonary Disease

## 2018-10-13 ENCOUNTER — Ambulatory Visit (HOSPITAL_COMMUNITY): Admission: RE | Admit: 2018-10-13 | Payer: Self-pay | Source: Ambulatory Visit

## 2018-10-13 DIAGNOSIS — R042 Hemoptysis: Secondary | ICD-10-CM

## 2018-10-13 DIAGNOSIS — J42 Unspecified chronic bronchitis: Secondary | ICD-10-CM

## 2018-10-13 NOTE — Telephone Encounter (Signed)
Patient called and stated that Dr. Valeta Harms prescribed prednisone 10 mg daily. He stated that the medicine causes his heart rate to increase. I instructed pt to call Dr. Valeta Harms office about this medication. I informed pt that the increased heart rate can be a typical side effect. But Dr. Valeta Harms prescribed the medicine and would be the one to change the medicine. Was going to transfer call to Device Tech RN to talk with pt about loop recorder home monitor results. The call was disconnected. Called pt back. Routed to Device [employer].

## 2018-10-13 NOTE — Telephone Encounter (Signed)
Pt is deaf.  Have sent a message about this via MyChart

## 2018-10-13 NOTE — Telephone Encounter (Signed)
Spoke with patient about the results of his remote transmission from today at 94. Presenting rhythm: SR @ ~75bpm. No episodes recorded. I told patient that he should call Dr.Icard's office regarding the prescription. Patient verbalized understanding.

## 2018-10-14 NOTE — Telephone Encounter (Signed)
Spoke with patient via email, patient only uses email for communication, his cell phone only has text messaging service. Informed patient that he no longer needs the Azithromycin. Will close this encounter and follow up in email.

## 2018-10-15 ENCOUNTER — Ambulatory Visit (INDEPENDENT_AMBULATORY_CARE_PROVIDER_SITE_OTHER): Payer: Medicare Other | Admitting: *Deleted

## 2018-10-15 DIAGNOSIS — R55 Syncope and collapse: Secondary | ICD-10-CM | POA: Diagnosis not present

## 2018-10-16 NOTE — Progress Notes (Signed)
[facility] Summary Report / Loop Recorder 

## 2018-10-21 ENCOUNTER — Ambulatory Visit (HOSPITAL_COMMUNITY)
Admission: RE | Admit: 2018-10-21 | Discharge: 2018-10-21 | Disposition: A | Discharge status: Home / Self Care | Payer: Medicare Other | Source: Ambulatory Visit | Attending: Pulmonary Disease | Admitting: Pulmonary Disease

## 2018-10-21 ENCOUNTER — Ambulatory Visit (HOSPITAL_COMMUNITY): Payer: Medicare Other

## 2018-10-21 ENCOUNTER — Encounter (HOSPITAL_COMMUNITY): Payer: Self-pay

## 2018-10-21 DIAGNOSIS — R1310 Dysphagia, unspecified: Secondary | ICD-10-CM | POA: Diagnosis not present

## 2018-10-21 DIAGNOSIS — R05 Cough: Secondary | ICD-10-CM | POA: Insufficient documentation

## 2018-10-21 DIAGNOSIS — J42 Unspecified chronic bronchitis: Secondary | ICD-10-CM | POA: Diagnosis not present

## 2018-10-21 DIAGNOSIS — R1312 Dysphagia, oropharyngeal phase: Secondary | ICD-10-CM | POA: Insufficient documentation

## 2018-10-21 NOTE — Evaluation (Signed)
Modified Barium Swallow Progress Note  Patient Details  Name: Nathaniel [name] MRN: [id number] Date of Birth: September 08, 1961  Today's Date: 10/21/2018  Modified Barium Swallow completed.  Full report located under Chart Review in the Imaging Section.  Brief recommendations include the following:  Clinical Impression  Pt presents with functional oropharyngeal swallowing. Oral mechanism reveals xerostomia and white lingual coating prior to PO. No penetration or aspiration was visualized with any consistencies or textures presented. Trace/mild BOT residue was noted across consistencies however additional dry swallow was effective in clearing residue. Recommend continue with current diet and thin liquids however recommend Pt avoid or take extra caution with certain foods reported to frequently cause coughing and choking episodes re: fish (with bones) and tough meats. Recommend utilize liquid wash to facilitate clearance of solids when Pt experienced globus sensation. SLP provided handout with recommendations for managing esophageal dysphagia. No further ST f/u is recommended at this time.    Swallow Evaluation Recommendations       SLP Diet Recommendations: Regular solids;Thin liquid   Liquid Administration via: Cup;Straw   Medication Administration: Whole meds with liquid   Supervision: Patient able to self feed   Compensations: Slow rate;Small sips/bites;Follow solids with liquid   Postural Changes: Remain semi-upright after after feeds/meals (Comment);Seated upright at 90 degrees   Oral Care Recommendations: Oral care BID      Amelia H. Roddie Mc, CCC-SLP Speech Language Pathologist   Wende Bushy 10/21/2018,2:19 PM

## 2018-10-22 ENCOUNTER — Ambulatory Visit (INDEPENDENT_AMBULATORY_CARE_PROVIDER_SITE_OTHER): Payer: Medicare Other | Admitting: Pulmonary Disease

## 2018-10-22 ENCOUNTER — Encounter: Payer: Self-pay | Admitting: Pulmonary Disease

## 2018-10-22 VITALS — BP 140/90 | HR 94 | Wt 176.0 lb

## 2018-10-22 DIAGNOSIS — R1310 Dysphagia, unspecified: Secondary | ICD-10-CM | POA: Diagnosis not present

## 2018-10-22 DIAGNOSIS — J42 Unspecified chronic bronchitis: Secondary | ICD-10-CM | POA: Diagnosis not present

## 2018-10-22 DIAGNOSIS — R042 Hemoptysis: Secondary | ICD-10-CM | POA: Diagnosis not present

## 2018-10-22 DIAGNOSIS — R1319 Other dysphagia: Secondary | ICD-10-CM

## 2018-10-22 MED ORDER — BUDESONIDE 0.5 MG/2ML IN SUSP: 0.5000 mg | Freq: Two times a day (BID) | RESPIRATORY_TRACT | 5 refills | Status: DC

## 2018-10-22 MED ORDER — ALBUTEROL SULFATE (2.5 MG/3ML) 0.083% IN NEBU: 2.5000 mg | INHALATION_SOLUTION | Freq: Four times a day (QID) | RESPIRATORY_TRACT | 5 refills | Status: AC | PRN

## 2018-10-22 MED ORDER — FORMOTEROL FUMARATE 20 MCG/2ML IN NEBU: 20.0000 ug | INHALATION_SOLUTION | Freq: Two times a day (BID) | RESPIRATORY_TRACT | 5 refills | Status: DC

## 2018-10-22 NOTE — Patient Instructions (Addendum)
Thank you for visiting Dr. Valeta Harms at [facility]. Today we recommend the following:  Continue gastroesophageal reflux precautions.  Continue your omeprazole.  Continue recommendations from speech therapist after having modified barium swallow study.  Avoid foods that cause to trouble with swallowing and choking sensation.  Meds ordered this encounter  Medications  . budesonide (PULMICORT) 0.5 MG/2ML nebulizer solution    Sig: Take 2 mLs (0.5 mg total) by nebulization 2 (two) times daily.    Dispense:  60 mL    Refill:  5  . formoterol (PERFOROMIST) 20 MCG/2ML nebulizer solution    Sig: Take 2 mLs (20 mcg total) by nebulization 2 (two) times daily.    Dispense:  60 mL    Refill:  5  . albuterol (PROVENTIL) (2.5 MG/3ML) 0.083% nebulizer solution    Sig: Take 3 mLs (2.5 mg total) by nebulization every 6 (six) hours as needed for wheezing or shortness of breath.    Dispense:  75 mL    Refill:  5   Return in about 8 weeks (around 12/17/2018).  We are moving our office in November. The new address will be: [address] [employer] [facility] Phone: [phone]

## 2018-10-22 NOTE — Progress Notes (Signed)
Synopsis: Referred in September 2019 for hemoptysis by Maryan Char, NP  Subjective:   PATIENT ID: Nathaniel [name] GENDER: male DOB: 1961-11-25, MRN: [id number]  Chief Complaint  Patient presents with  . Follow-up    reports "feeling same", hemoptysis with hard cough- last episode last night    Initial OV: Patient was initially seen in July 2019 in the emergency room with complaints of coughing up blood.  This is presumed related to hematemesis and a prior history of Barrett's esophagus.  Patient was treated as acute gastritis with outpatient follow-up EGD.  EGD was completed August 7 with revealed small area of Barrett's.  At that time the decision was made for referral to pulmonary for evaluation hemoptysis.  Chest imaging completed at his evaluation in the ED in July 2019 revealed no acute cardiopulmonary process however visible implanted cardiac loop monitor.  He has been to see by 4 different doctors over the past 1.5 years. He has 3 endoscopies for additional evaluations, some being for follow up on his barratts. The prior has found barratts esophagus again and no identified source of hemoptysis. Sometimes if he coughs really hard he will have blood mucus.  Coughing up of blood usually occurs in the middle of the night when he wakes up to go to the restroom.  He feels as if he has congestion within the left chest and after several bouts of coughing he will bring up and expectorate mucus that contains bright red and sometimes dark flecks of blood.  He is a life long non-smoker. Please see the picture below that gives a visual to the amount of blood coughed up.   Patient denies family history of chronic lung disease.  He does a significant family history for coronary disease as his father has had a heart attack and he has lost his brothers at young age related to cardiovascular disease.   OV 10/09/2018: Patient was taken for bronchoscopy which revealed diffuse distinctly friable mucosa  that was hyperemic pink.  Under narrowband imaging there was an extensive network of vasculature suggestive of diffuse of bronchitis and inflamed airways.  There was no obvious evidence of erosion.  BAL cultures revealed normal flora and cytology was negative for malignancy.  Forcep biopsies of 2 small nodules of the anterior wall negative for malignancy.  He has had continued cough and sputum production since his bronchoscopy.  He states that he only has blood appear when he has extensive or violent coughing episodes.  His chest always feels tight and has associated wheezing.  He denies fevers, chills, night sweats.  He is tired and because he is unable to sleep at night due to cough. He does admit to coughing and choking on foods on occasion. He sleeps with 3 pillows.  Today we used the iPad interpreting system for ASL.  OV 10/22/2018: Since her last visit today had a MBSS diagnosed with unspecified dysphagia, possible esophageal dysphasia, mild aspiration risk, speech felt patient was able to maintain current diet and no further work needed with Electrical engineer. Patient was given information on recommendations for managing esophageal dysphagia.  Also recommended to utilize liquids to facilitate clearance of solids.  Unfortunately, the patient has recently suffered a heartbreaking event in his life.  His wife and son were both involved in a motor vehicle accident and died.  He is currently grieving with this situation.  Unfortunately he has started drinking homemade moonshine to help cope with this.  Patient was counseled on the  fact that this was probably not the best way to deal with the situation.  Additionally, as for his respiratory health he has not been using any of his inhalers.  He did not take the rest of his prednisone due to increased palpitations.  We were unable to start the patient on azithromycin due to his interaction with amiodarone.  And he has not routinely been taking his medication for  the treatment of his reflux.  Overall he has not been doing anything that we have asked him to do for his chronic bronchitis.  He did however see a new primary care provider and was started on a antibiotic, doxycycline.  The patient is still having ongoing cough and sputum production.  His cough is mainly worse at night and he is still coughing up blood in the middle the night associated with mucus plugging.     Past Medical History:  Diagnosis Date  . Allergy   . Arthritis    right knee  . Asthma    prn inhaler  . Barrett's esophagus 10-25-14 pt denies   states "minor"  . Chest pain    a. 06/2016 MV: EF 52%, no ischemia or infarct.  . 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 Deaf    Call [phone] for patient's sign language interpreter.   Marland Kitchen GERD (gastroesophageal reflux disease)   . Heartburn    occasional, related to certain foods  . Hemoptysis 09/20/2018  . High cholesterol   . Intermittent palpitations 10-25-14 patient denies   a. Event Monitor 04/2014: Mostly NSR - intermittent PACs, - 2 brief runs of PAT/PSVT  . PSVT (paroxysmal supraventricular tachycardia) ([place])    a. 04/2014 Event Monitor: brief PAT/PSVT-->uses prn flecainide.  . Seasonal allergies   . Stroke [facility]) 2013   TIA  . TIA (transient ischemic attack) 10-25-14 pt denies   no current deficits  . Vasovagal syncope      Family History  Problem Relation Age of Onset  . Ovarian cancer Mother   . Coronary artery disease Father   . Heart attack Father 10  . Heart attack Brother 3  . Heart attack Paternal Grandfather 22  . Colon cancer Neg Hx   . Stomach cancer Neg Hx   . Esophageal cancer Neg Hx   . Rectal cancer Neg Hx      Past Surgical History:  Procedure Laterality Date  . [place]  08/2015   Ranging from mostly sinus rhythm to occasional sinus bradycardia and sinus tachycardia. Heart rate from 53 bpm to 129 bpm. No documented PVCs or PACs noted. No arrhythmia noted. No PSVT or A.  Fib. "Heart racing" noted with sinus tachycardia.  . Abdominal Aortic Ultrasound  2012   normal abdominal aorta  . BUNIONECTOMY Left 2012   right done also  . CARDIAC CATHETERIZATION  06/24/2011   wwidely patent normal coronary arteries with normal ejection fraction  . Carotid Artery Dopplers  2012   tortuous but no stenoses.. No subclavian disease  . COLONOSCOPY  2015  . COLONOSCOPY WITH PROPOFOL N/A 11/03/2014   Procedure: COLONOSCOPY WITH PROPOFOL;  Surgeon: Milus Banister, MD;  Location: [facility];  Service: Endoscopy;  Laterality: N/A;  . Foot Implant Removal Right 07/15/2013   @ [place]  . INGUINAL HERNIA REPAIR    . KNEE ARTHROSCOPY Left 06/02/2012  . KNEE ARTHROSCOPY Right fall 2013   x 2  . KNEE ARTHROSCOPY Left 03/03/2013   Procedure: ARTHROSCOPY KNEE WITH CHONDROPLASTY PATELLA  AND LATERAL TIBIAL PLATEAU;  Surgeon: Kerin Salen, MD;  Location: [facility];  Service: Orthopedics;  Laterality: Left;  . LOOP RECORDER INSERTION N/A 02/26/2018   Procedure: LOOP RECORDER INSERTION;  Surgeon: Constance Haw, MD;  Location: [facility];  Service: Cardiovascular;  Laterality: N/A;  . NM MYOVIEW LTD  06/2016   No evidence of ischemia or infarction.   Domingo Dimes ECHOCARDIOGRAM  June 2015, August 2018   A) normal EF: 55-60%. No wall motion abnormalities. Gr 1 DD.  Mild MR. Otherwise normal;; b) ([facility]) - Normal LV size and function.  EF 60-65%.  No significant valvular abnormalities.  Marland Kitchen Upper and Lower Extremity Arterial Dopplers  2012   normal arterial flow bilateral upper extremities including subclavian is a carotid;;R. ABI 1.2, L. ABI 1.28. Normal flow velocities. Likely calcified vessels.  Marland Kitchen UPPER GASTROINTESTINAL ENDOSCOPY  2013  . VIDEO BRONCHOSCOPY Bilateral 09/30/2018   Procedure: VIDEO BRONCHOSCOPY WITH FLUORO;  Surgeon: Garner Nash, DO;  Location: [facility];  Service: Cardiopulmonary;  Laterality: Bilateral;    Social History    Socioeconomic History  . Marital status: Married    Spouse name: Not on file  . Number of children: 1  . Years of education: college  . Highest education level: Not on file  Occupational History  . Occupation: disability  Social Needs  . Financial resource strain: Not on file  . Food insecurity:    Worry: Not on file    Inability: Not on file  . Transportation needs:    Medical: Not on file    Non-medical: Not on file  Tobacco Use  . Smoking status: Never Smoker  . Smokeless tobacco: Never Used  Substance and Sexual Activity  . Alcohol use: Never    Alcohol/week: 0.0 standard drinks    Frequency: Never    Comment: occ  . Drug use: No  . Sexual activity: Not on file  Lifestyle  . Physical activity:    Days per week: Not on file    Minutes per session: Not on file  . Stress: Not on file  Relationships  . Social connections:    Talks on phone: Not on file    Gets together: Not on file    Attends religious service: Not on file    Active member of club or organization: Not on file    Attends meetings of clubs or organizations: Not on file    Relationship status: Not on file  . Intimate partner violence:    Fear of current or ex partner: Not on file    Emotionally abused: Not on file    Physically abused: Not on file    Forced sexual activity: Not on file  Other Topics Concern  . Not on file  Social History Narrative   He is deaf, requiring an interpreter.   He started a new job back in 2013, which was much less stressful for him.  He has subsequently gone on to disability.   He is a married father of one. He previously wrote his bike 2 times a week.   Does not smoke or drink.           Allergies  Allergen Reactions  . Prednisone Palpitations  . Shellfish-Derived Products Itching and Rash  . Shrimp [Shellfish Allergy] Rash and Itching  . Adhesive [Tape] Itching  . Simvastatin Other (See Comments)    DEPRESSION, VISUAL DISTURBANCE(FLASHING LIGHTS)  .  Tamsulosin Other (See Comments)  Other reaction(s): Confusion Other reaction(s): Other (See Comments) Changes in behavior  Changes in behavior  Changes in behavior   . Simvastatin - High Dose  [Simvastatin-High Dose]     Depression, behavioral issues  . Fish Allergy Rash and Other (See Comments)    SOFTSHELL FISH  . Fish-Derived Products Rash and Other (See Comments)    SOFTSHELL FISH SOFTSHELL [place]  . Isovue [Iopamidol] Anxiety    Pt c/o chest tightness post injection, respiratory rate and o2 sats normal, evaluated by ED RN and respiratory and PA, given IV benadryl Pt states he does NOT want contrast in the future     Outpatient Medications Prior to Visit  Medication Sig Dispense Refill  . aspirin EC 81 MG EC tablet Take 1 tablet (81 mg total) by mouth daily. 30 tablet 2  . fluticasone (FLONASE) 50 MCG/ACT nasal spray Place 2 sprays into both nostrils daily.    Marland Kitchen omeprazole (PRILOSEC) 40 MG capsule Take 1 capsule (40 mg total) by mouth 2 (two) times daily. 90 capsule 3  . Spacer/Aero-Holding Chambers (AEROCHAMBER MV) inhaler Use as instructed 1 each 0  . albuterol (PROVENTIL) (2.5 MG/3ML) 0.083% nebulizer solution Take 3 mLs (2.5 mg total) by nebulization every 6 (six) hours as needed for wheezing or shortness of breath. 75 mL 12  . azithromycin (ZITHROMAX) 250 MG tablet Take 1 tablet Monday, Wednesday, and Friday, (Patient not taking: Reported on 10/22/2018) 12 tablet 5  . budesonide-formoterol (SYMBICORT) 160-4.5 MCG/ACT inhaler Inhale 2 puffs into the lungs every 12 (twelve) hours. (Patient not taking: Reported on 10/22/2018) 1 Inhaler 6  . predniSONE (DELTASONE) 10 MG tablet Take 4 tabs by mouth once daily x4 days, then 3 tabs x4 days, 2 tabs x4 days, 1 tab x4 days and stop. 40 tablet 0   No facility-administered medications prior to visit.     Review of Systems  Constitutional: Negative for chills, fever, malaise/fatigue and weight loss.  HENT: Negative for hearing loss,  sore throat and tinnitus.   Eyes: Negative for blurred vision and double vision.  Respiratory: Positive for cough, hemoptysis, sputum production, shortness of breath and wheezing. Negative for stridor.   Cardiovascular: Negative for chest pain, palpitations, orthopnea, leg swelling and PND.  Gastrointestinal: Negative for abdominal pain, constipation, diarrhea, heartburn, nausea and vomiting.  Genitourinary: Negative for dysuria, hematuria and urgency.  Musculoskeletal: Negative for joint pain and myalgias.  Skin: Negative for itching and rash.  Neurological: Negative for dizziness, tingling, weakness and headaches.  Endo/Heme/Allergies: Negative for environmental allergies. Does not bruise/bleed easily.  Psychiatric/Behavioral: Negative for depression. The patient is not nervous/anxious and does not have insomnia.   All other systems reviewed and are negative.   Objective:  Physical Exam  Constitutional: He is oriented to person, place, and time. He appears well-developed and well-nourished. No distress.  HENT:  Head: Normocephalic and atraumatic.  Mouth/Throat: Oropharynx is clear and moist.  Eyes: Pupils are equal, round, and reactive to light. Conjunctivae are normal. No scleral icterus.  Neck: Neck supple. No JVD present. No tracheal deviation present.  Cardiovascular: Normal rate, regular rhythm, normal heart sounds and intact distal pulses.  No murmur heard. Pulmonary/Chest: Effort normal and breath sounds normal. No accessory muscle usage or stridor. No tachypnea. No respiratory distress. He has no wheezes. He has no rhonchi. He has no rales.  Anterior rhonchi worse over the left side.  Abdominal: Soft. Bowel sounds are normal. He exhibits no distension. There is no tenderness.  Musculoskeletal: He exhibits no  edema or tenderness.  Lymphadenopathy:    He has no cervical adenopathy.  Neurological: He is alert and oriented to person, place, and time.  Skin: Skin is warm and dry.  Capillary refill takes less than 2 seconds. No rash noted.  Psychiatric: He has a normal mood and affect. His behavior is normal.  Vitals reviewed.    Vitals:   10/22/18 0956  BP: 140/90  Pulse: 94  SpO2: 98%  Weight: 176 lb (79.8 kg)   98% on RA BMI Readings from Last 3 Encounters:  10/22/18 24.55 kg/m  10/09/18 24.97 kg/m  09/21/18 25.27 kg/m   Wt Readings from Last 3 Encounters:  10/22/18 176 lb (79.8 kg)  10/09/18 179 lb (81.2 kg)  09/21/18 181 lb 3.2 oz (82.2 kg)    CBC    Component Value Date/Time   WBC 5.8 07/31/2018 1556   RBC 4.23 07/31/2018 1556   HGB 13.0 07/31/2018 1556   HCT 38.6 (L) 07/31/2018 1556   PLT 223.0 07/31/2018 1556   MCV 91.4 07/31/2018 1556   MCH 30.3 07/24/2018 1136   MCHC 33.5 07/31/2018 1556   RDW 13.7 07/31/2018 1556   LYMPHSABS 1.7 07/31/2018 1556   MONOABS 0.4 07/31/2018 1556   EOSABS 0.2 07/31/2018 1556   BASOSABS 0.0 07/31/2018 1556    Chest Imaging: Chest x-ray from July 2019: No acute cardiopulmonary process, no infiltrate  CT chest February 2019: No visible abnormality within the trachea or bronchus, no parenchymal abnormalities except for small areas of groundglass consistent with possible atelectasis  Pulmonary Functions Testing Results: Pending   FeNO: None   Pathology: None   Echocardiogram: None   09/03/2018: - Left ventricle: The cavity size was normal. Systolic function was   normal. The estimated ejection fraction was in the range of 55%   to 60%. There is hypokinesis of the apicalanteroseptal   myocardium. Doppler parameters are consistent with abnormal left   ventricular relaxation (grade 1 diastolic dysfunction). - Mitral valve: There was trivial regurgitation. - Tricuspid valve: There was mild regurgitation. - Pulmonic valve: There was trivial regurgitation.  Heart Catheterization: None     Assessment & Plan:   Chronic bronchitis, unspecified chronic bronchitis type ([place]) - Plan: albuterol  (PROVENTIL) (2.5 MG/3ML) 0.083% nebulizer solution  Hemoptysis  Cough with hemoptysis  Esophageal dysphagia  Dysphagia, unspecified type  Discussion:   This is a 57 year old gentleman with chronic bronchitis.  I believe that he is having something that is chronically irritating his airway.  My concern is that he is having reflux into the airway and chemical irritation due to gastric acid.  Additionally he has a history of asthma and all of this is irritating his airway to the point where he has chronic bronchitis and chronic cough.  With the persistent coughing comes erythematous and hypervascular airway.  Increased pressure with the cough leads to disruption of the mucosal bed and bleeding within the airway and leakage of the small blood vessels.  This I believe is the etiology of his hemoptysis.  Plan:  Since the patient will not take prednisone and does not like using inhalers we will make the following changes.  We will start Pulmicort 500 twice daily Start formoterol nebulized twice daily Continue albuterol nebulized as needed Stop Symbicort inhaler stop Advair inhaler (he has not been using these inhalers regularly anyways) Can continue albuterol HFA inhaler as needed for shortness of breath and wheezing. Patient was counseled on GERD management Recommend elevation of the head of the bed  greater than 30 degrees. We will give flutter valve to help with airway clearance techniques. Return to clinic in 8 weeks  Greater than 50% of this patient's 40-minute visit was spent counseling the patient on the above disease process as well as above treatment plan as outlined.   Current Outpatient Medications:  .  albuterol (PROVENTIL) (2.5 MG/3ML) 0.083% nebulizer solution, Take 3 mLs (2.5 mg total) by nebulization every 6 (six) hours as needed for wheezing or shortness of breath., Disp: 75 mL, Rfl: 5 .  aspirin EC 81 MG EC tablet, Take 1 tablet (81 mg total) by mouth daily., Disp: 30  tablet, Rfl: 2 .  fluticasone (FLONASE) 50 MCG/ACT nasal spray, Place 2 sprays into both nostrils daily., Disp: , Rfl:  .  omeprazole (PRILOSEC) 40 MG capsule, Take 1 capsule (40 mg total) by mouth 2 (two) times daily., Disp: 90 capsule, Rfl: 3 .  Spacer/Aero-Holding Chambers (AEROCHAMBER MV) inhaler, Use as instructed, Disp: 1 each, Rfl: 0 .  budesonide (PULMICORT) 0.5 MG/2ML nebulizer solution, Take 2 mLs (0.5 mg total) by nebulization 2 (two) times daily., Disp: 60 mL, Rfl: 5 .  formoterol (PERFOROMIST) 20 MCG/2ML nebulizer solution, Take 2 mLs (20 mcg total) by nebulization 2 (two) times daily., Disp: 60 mL, Rfl: 5   Garner Nash, DO [facility] 10/22/2018 10:35 AM

## 2018-10-27 ENCOUNTER — Other Ambulatory Visit: Payer: Self-pay

## 2018-10-27 DIAGNOSIS — J42 Unspecified chronic bronchitis: Secondary | ICD-10-CM

## 2018-10-27 LAB — CUP PACEART REMOTE DEVICE CHECK
Date Time Interrogation Session: 20191017220922
Implantable Pulse Generator Implant Date: 20190228

## 2018-10-27 MED ORDER — FORMOTEROL FUMARATE 20 MCG/2ML IN NEBU: 20.0000 ug | INHALATION_SOLUTION | Freq: Two times a day (BID) | RESPIRATORY_TRACT | 5 refills | Status: AC

## 2018-10-27 NOTE — Progress Notes (Signed)
Received note from [facility], they do not carry perforomist. Script printed and gave to [facility] to send to [facility]. Nothing further is needed at this time.

## 2018-10-28 ENCOUNTER — Telehealth: Payer: Self-pay | Admitting: Cardiology

## 2018-10-28 NOTE — Telephone Encounter (Signed)
LMOVM reminding pt to send remote transmission.   

## 2018-10-28 NOTE — Telephone Encounter (Signed)
Patient called back due to home monitor not connecting. After convincing patient to send the transmission. He informed me that he did not feel well on Tuesday 10-27-18. He wants the Device Tech RN to review and send him a [employer] (e-mail) with the results from today transmission.

## 2018-11-04 ENCOUNTER — Ambulatory Visit: Payer: Self-pay | Admitting: Pulmonary Disease

## 2018-11-05 ENCOUNTER — Telehealth: Payer: Self-pay | Admitting: Pulmonary Disease

## 2018-11-05 NOTE — Telephone Encounter (Signed)
PCCM:  Can we try going through the [facility]? Or medication assistance program?  Thanks  Garner Nash, DO [facility] 11/05/2018 4:54 PM

## 2018-11-05 NOTE — Telephone Encounter (Signed)
Spoke with Loletha Grayer and he states the patient states the Rx for Budesonide is too expensive at 14 dollars. Loletha Grayer states he doesn't know how he will get it cheaper than that. They dont carry the Perforomist and this medication will likely have a copay as well.. Dr. Valeta Harms please advise.   Please send mychart message to pt due to loss of hearing

## 2018-11-06 ENCOUNTER — Encounter: Payer: Self-pay | Admitting: *Deleted

## 2018-11-06 MED ORDER — BUDESONIDE 0.5 MG/2ML IN SUSP: 0.5000 mg | Freq: Two times a day (BID) | RESPIRATORY_TRACT | 5 refills | Status: AC

## 2018-11-06 NOTE — Telephone Encounter (Signed)
[facility] to see if it would be cheaper there but they were closed for lunch. Will try again later.

## 2018-11-06 NOTE — Telephone Encounter (Signed)
Called and spoke with [facility].  Pharmacist stated that budesonide would have to be run through insurance to find cost, because his primary insurance is Medicare.  If his primary insurance was Medicaid, the cost $3.  Will send prescription to [place], to see if cost is more affordable for Patient. New prescription sent to [facility] for them to run through insurance. Mychart message sent to Patient to update him on neb prescription.

## 2018-11-06 NOTE — Telephone Encounter (Signed)
Will route to triage.

## 2018-11-13 LAB — ACID FAST CULTURE WITH REFLEXED SENSITIVITIES: ACID FAST CULTURE - AFSCU3: NEGATIVE

## 2018-11-13 LAB — ACID FAST CULTURE WITH REFLEXED SENSITIVITIES (MYCOBACTERIA)

## 2018-11-16 ENCOUNTER — Ambulatory Visit (INDEPENDENT_AMBULATORY_CARE_PROVIDER_SITE_OTHER): Payer: Medicare Other | Admitting: Cardiology

## 2018-11-16 ENCOUNTER — Encounter: Payer: Self-pay | Admitting: Cardiology

## 2018-11-16 VITALS — BP 134/66 | HR 90 | Ht 71.0 in | Wt 179.0 lb

## 2018-11-16 DIAGNOSIS — I471 Supraventricular tachycardia: Secondary | ICD-10-CM | POA: Diagnosis not present

## 2018-11-16 DIAGNOSIS — R55 Syncope and collapse: Secondary | ICD-10-CM | POA: Diagnosis not present

## 2018-11-16 DIAGNOSIS — I493 Ventricular premature depolarization: Secondary | ICD-10-CM

## 2018-11-16 LAB — CUP PACEART INCLINIC DEVICE CHECK
Date Time Interrogation Session: 20191118160405
MDC IDC PG IMPLANT DT: 20190228

## 2018-11-16 NOTE — Progress Notes (Signed)
Electrophysiology Office Note   Date:  11/16/2018   ID:  IVO MOGA, DOB Aug 18, 1961, MRN [id number]  PCP:  Maryan Char, NP  Cardiologist:  Ellyn Hack Primary Electrophysiologist:   Meredith Leeds, MD    No chief complaint on file.    History of Present Illness: Nathaniel [name] is a 57 y.o. male who is being seen today for the evaluation of syncope at the request of Maryan Char, NP. Presenting today for electrophysiology evaluation.  He has a history of hyperlipidemia, PSVT on as needed flecainide, CVA.  He is also deaf and uses a sign language interpreter.  He was started on flecainide in 2017.  He was admitted to the hospital in August 2018 and was noted to have SVT.  Flecainide was increased to 100 mg twice a day at that time.  He presented to the emergency room August 31 due to palpitations.  He took an extra flecainide with no benefit.  He went to the emergency room for a second event in 2 days.  Linq monitor implanted 02/26/2018.  Today, denies symptoms of palpitations, chest pain, shortness of breath, orthopnea, PND, lower extremity edema, claudication, dizziness, presyncope, syncope, bleeding, or neurologic sequela. The patient is tolerating medications without difficulties.  He continues to have episodes of syncope.  He has not been using his symptom activator around his episodes of syncope.  He is unclear as to how long he has been passed out.  He feels that he is having significant issues with his heart valve.  He did have an echocardiogram that showed no major valvular abnormalities.   Past Medical History:  Diagnosis Date  . Allergy   . Arthritis    right knee  . Asthma    prn inhaler  . Barrett's esophagus 10-25-14 pt denies   states "minor"  . Chest pain    a. 06/2016 MV: EF 52%, no ischemia or infarct.  . 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 Deaf    Call [phone] for patient's sign language interpreter.   Marland Kitchen GERD (gastroesophageal reflux  disease)   . Heartburn    occasional, related to certain foods  . Hemoptysis 09/20/2018  . High cholesterol   . Intermittent palpitations 10-25-14 patient denies   a. Event Monitor 04/2014: Mostly NSR - intermittent PACs, - 2 brief runs of PAT/PSVT  . PSVT (paroxysmal supraventricular tachycardia) ([place])    a. 04/2014 Event Monitor: brief PAT/PSVT-->uses prn flecainide.  . Seasonal allergies   . Stroke [facility]) 2013   TIA  . TIA (transient ischemic attack) 10-25-14 pt denies   no current deficits  . Vasovagal syncope    Past Surgical History:  Procedure Laterality Date  . [facility]  08/2015   Ranging from mostly sinus rhythm to occasional sinus bradycardia and sinus tachycardia. Heart rate from 53 bpm to 129 bpm. No documented PVCs or PACs noted. No arrhythmia noted. No PSVT or A. Fib. "Heart racing" noted with sinus tachycardia.  . Abdominal Aortic Ultrasound  2012   normal abdominal aorta  . BUNIONECTOMY Left 2012   right done also  . CARDIAC CATHETERIZATION  06/24/2011   wwidely patent normal coronary arteries with normal ejection fraction  . Carotid Artery Dopplers  2012   tortuous but no stenoses.. No subclavian disease  . COLONOSCOPY  2015  . COLONOSCOPY WITH PROPOFOL N/A 11/03/2014   Procedure: COLONOSCOPY WITH PROPOFOL;  Surgeon: Milus Banister, MD;  Location: [facility];  Service: Endoscopy;  Laterality: N/A;  . Foot Implant Removal Right 07/15/2013   @ [facility]  . INGUINAL HERNIA REPAIR    . KNEE ARTHROSCOPY Left 06/02/2012  . KNEE ARTHROSCOPY Right fall 2013   x 2  . KNEE ARTHROSCOPY Left 03/03/2013   Procedure: ARTHROSCOPY KNEE WITH CHONDROPLASTY PATELLA  AND LATERAL TIBIAL PLATEAU;  Surgeon: Kerin Salen, MD;  Location: [place];  Service: Orthopedics;  Laterality: Left;  . LOOP RECORDER INSERTION N/A 02/26/2018   Procedure: LOOP RECORDER INSERTION;  Surgeon: Constance Haw, MD;  Location: [facility];  Service: Cardiovascular;   Laterality: N/A;  . NM MYOVIEW LTD  06/2016   No evidence of ischemia or infarction.   Domingo Dimes ECHOCARDIOGRAM  June 2015, August 2018   A) normal EF: 55-60%. No wall motion abnormalities. Gr 1 DD.  Mild MR. Otherwise normal;; b) ([facility]) - Normal LV size and function.  EF 60-65%.  No significant valvular abnormalities.  Marland Kitchen Upper and Lower Extremity Arterial Dopplers  2012   normal arterial flow bilateral upper extremities including subclavian is a carotid;;R. ABI 1.2, L. ABI 1.28. Normal flow velocities. Likely calcified vessels.  Marland Kitchen UPPER GASTROINTESTINAL ENDOSCOPY  2013  . VIDEO BRONCHOSCOPY Bilateral 09/30/2018   Procedure: VIDEO BRONCHOSCOPY WITH FLUORO;  Surgeon: Garner Nash, DO;  Location: [facility];  Service: Cardiopulmonary;  Laterality: Bilateral;     Current Outpatient Medications  Medication Sig Dispense Refill  . albuterol (PROVENTIL) (2.5 MG/3ML) 0.083% nebulizer solution Take 3 mLs (2.5 mg total) by nebulization every 6 (six) hours as needed for wheezing or shortness of breath. 75 mL 5  . aspirin EC 81 MG EC tablet Take 1 tablet (81 mg total) by mouth daily. 30 tablet 2  . budesonide (PULMICORT) 0.5 MG/2ML nebulizer solution Take 2 mLs (0.5 mg total) by nebulization 2 (two) times daily. 60 mL 5  . fluticasone (FLONASE) 50 MCG/ACT nasal spray Place 2 sprays into both nostrils daily.    . formoterol (PERFOROMIST) 20 MCG/2ML nebulizer solution Take 2 mLs (20 mcg total) by nebulization 2 (two) times daily. 60 mL 5  . omeprazole (PRILOSEC) 40 MG capsule Take 1 capsule (40 mg total) by mouth 2 (two) times daily. 90 capsule 3  . Spacer/Aero-Holding Chambers (AEROCHAMBER MV) inhaler Use as instructed 1 each 0   No current facility-administered medications for this visit.     Allergies:   Prednisone; Shellfish-derived products; Shrimp [shellfish allergy]; Adhesive [tape]; Simvastatin; Tamsulosin; Simvastatin - high dose  [simvastatin-high dose]; Fish allergy; Fish-derived  products; and Isovue [iopamidol]   Social History:  The patient  reports that he has never smoked. He has never used smokeless tobacco. He reports that he does not drink alcohol or use drugs.   Family History:  The patient's family history includes Coronary artery disease in his father; Heart attack (age of onset: 16) in his brother; Heart attack (age of onset: 74) in his paternal grandfather; Heart attack (age of onset: 53) in his father; Ovarian cancer in his mother.    ROS:  Please see the history of present illness.   Otherwise, review of systems is positive for chest pain, shortness of breath, palpitations, hearing loss, cough, passing out.   All other systems are reviewed and negative.   PHYSICAL EXAM: VS:  BP 134/66   Pulse 90   Ht 5\' 11"  (1.803 m)   Wt 179 lb (81.2 kg)   SpO2 98%   BMI 24.97 kg/m  , BMI Body mass index  is 24.97 kg/m. GEN: Well nourished, well developed, in no acute distress  HEENT: normal  Neck: no JVD, carotid bruits, or masses Cardiac: RRR; no murmurs, rubs, or gallops,no edema  Respiratory:  clear to auscultation bilaterally, normal work of breathing GI: soft, nontender, nondistended, + BS MS: no deformity or atrophy  Skin: warm and dry, device site well healed Neuro:  Strength and sensation are intact Psych: euthymic mood, full affect  EKG:  EKG is not ordered today. Personal review of the ekg ordered 09/22/18 shows sinus rhythm  Personal review of the device interrogation today. Results in [place]: 06/28/2018: Magnesium 2.0 07/31/2018: Hemoglobin 13.0; Platelets 223.0 08/10/2018: ALT 17; TSH 2.210 09/21/2018: BUN 15; Creatinine, Ser 0.99; Potassium 4.0; Sodium 139    Lipid Panel     Component Value Date/Time   CHOL 166 07/10/2016 0158   TRIG 180 (H) 07/10/2016 0158   HDL 26 (L) 07/10/2016 0158   CHOLHDL 6.4 07/10/2016 0158   VLDL 36 07/10/2016 0158   LDLCALC 104 (H) 07/10/2016 0158     Wt Readings from Last 3 Encounters:    11/16/18 179 lb (81.2 kg)  10/22/18 176 lb (79.8 kg)  10/09/18 179 lb (81.2 kg)      Other studies Reviewed: Additional studies/ records that were reviewed today include: TTE 08/2018  Review of the above records today demonstrates:  - Left ventricle: The cavity size was normal. Systolic function was   normal. The estimated ejection fraction was in the range of 55%   to 60%. There is hypokinesis of the apicalanteroseptal   myocardium. Doppler parameters are consistent with abnormal left   ventricular relaxation (grade 1 diastolic dysfunction). - Mitral valve: There was trivial regurgitation. - Tricuspid valve: There was mild regurgitation. - Pulmonic valve: There was trivial regurgitation.  30 day monitor 10/08/17 - personally reviewed  Mostly normal sinus rhythm with occasional sinus tachycardia and sinus arrhythmia.  Symptoms (chest pain and tightness) were noted with sinus rhythm, sinus tachycardia, sinus arrhythmia.  Very rare PVCs noted.  1 short run of 8-10 beats wide-complex tachycardia noted a symptomatic, but not long enough to cause any significant fin symptoms dings.  ASSESSMENT AND PLAN:  1.  PVCs: Off of metoprolol and flecainide.  He has had no further ventricular tachycardia.  No changes.  2.  SVT: On no current antiarrhythmics.  He has had no further episodes of SVT.  3.  Syncope: Linq monitor in place.  He has had a few further episodes of syncope.  It is unclear as to the cause of his syncope as he has been in normal rhythm.  We have told him to use a symptom activator around the time of his episodes.   Current medicines are reviewed at length with the patient today.   The patient does not have concerns regarding his medicines.  The following changes were made today: None  Labs/ tests ordered today include:  No orders of the defined types were placed in this encounter.    Disposition:   FU with   6 months  Signed,  Meredith Leeds, MD   11/16/2018 10:00 AM     [facility] [place] [address] [phone] (office) [phone] (fax)

## 2018-11-16 NOTE — Patient Instructions (Addendum)
Medication Instructions:  Your physician recommends that you continue on your current medications as directed. Please refer to the Current Medication list given to you today.  If you need a refill on your cardiac medications before your next appointment, please call your pharmacy.   Lab work: None ordered  If you have labs (blood work) drawn today and your tests are completely normal, you will receive your results only by: Marland Kitchen MyChart Message (if you have MyChart) OR . A paper copy in the mail If you have any lab test that is abnormal or we need to change your treatment, we will call you to review the results.  Testing/Procedures: None ordered  Follow-Up: Remote monitoring is used to monitor your Loop Recorder from home. This monitoring reduces the number of office visits required to check your device to one time per year. It allows [place] to keep an eye on the functioning of your device to ensure it is working properly. You are scheduled for a monthly device check from home on 11/17/2018. You may send your transmission at any time that day. If you have a wireless device, the transmission will be sent automatically.  At [facility] you and your health needs are our priority.  As part of our continuing mission to provide you with exceptional heart care, we have created designated Provider Care Teams.  These Care Teams include your primary Cardiologist (physician) and Advanced Practice Providers (APPs -  Physician Assistants and Nurse Practitioners) who all work together to provide you with the care you need, when you need it. You will need a follow up appointment in 6 months.  Please call our office 2 months in advance to schedule this appointment.  You may see Will Meredith Leeds, MD or one of the following Advanced Practice Providers on your designated Care Team:   Chanetta Marshall, NP . Tommye Standard, PA-C  Thank you for choosing [facility]!!   Trinidad Curet, RN [phone]

## 2018-11-17 ENCOUNTER — Ambulatory Visit (INDEPENDENT_AMBULATORY_CARE_PROVIDER_SITE_OTHER): Payer: Medicare Other

## 2018-11-17 DIAGNOSIS — R55 Syncope and collapse: Secondary | ICD-10-CM | POA: Diagnosis not present

## 2018-11-19 NOTE — Progress Notes (Signed)
[facility] Summary Report / Loop Recorder 

## 2018-11-27 IMAGING — RF DG SWALLOWING FUNCTION
12 of 20 series · 12 of 24 positions shown · non-contrast
Comparison: None.

CLINICAL DATA: Dysphagia.

EXAM:
MODIFIED BARIUM SWALLOW
TECHNIQUE: Different consistencies of barium were administered orally to the
patient by the Speech Pathologist. Imaging of the pharynx was
performed in the lateral projection.
FLUOROSCOPY TIME:  Fluoroscopy Time:  1 minutes and 51 seconds
Radiation Exposure Index (if provided by the fluoroscopic device):
16 mGy
Number of Acquired Spot Images: 18

[Series 1: run · 1 of 313 frames shown (1 of 12)]
[frame 267/313]
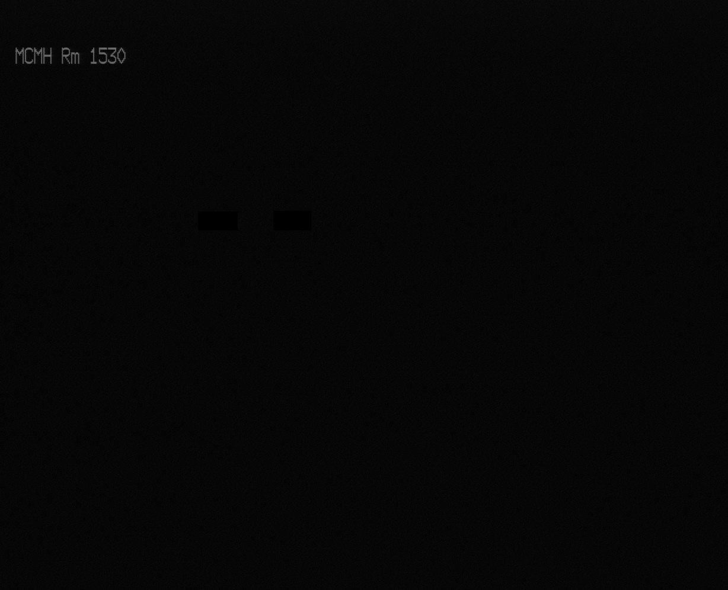

[Series 3: run · 1 of 416 frames shown (2 of 12)]
[frame 209/416]
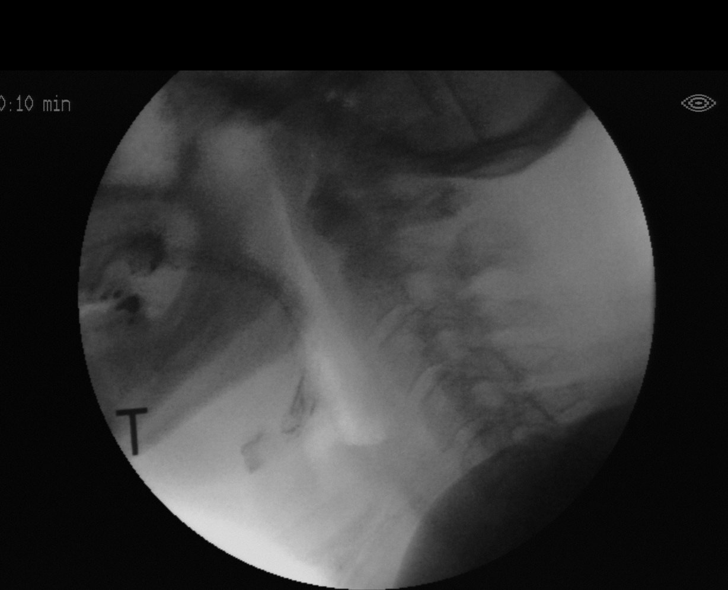

[Series 5: run · 1 of 65 frames shown (3 of 12)]
[frame 10/65]
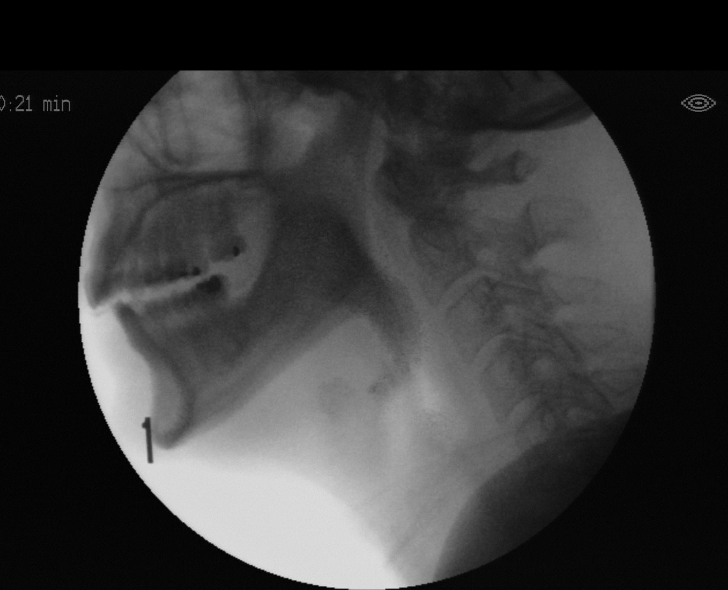

[Series 7: run · 1 of 95 frames shown (4 of 12)]
[frame 1/95]
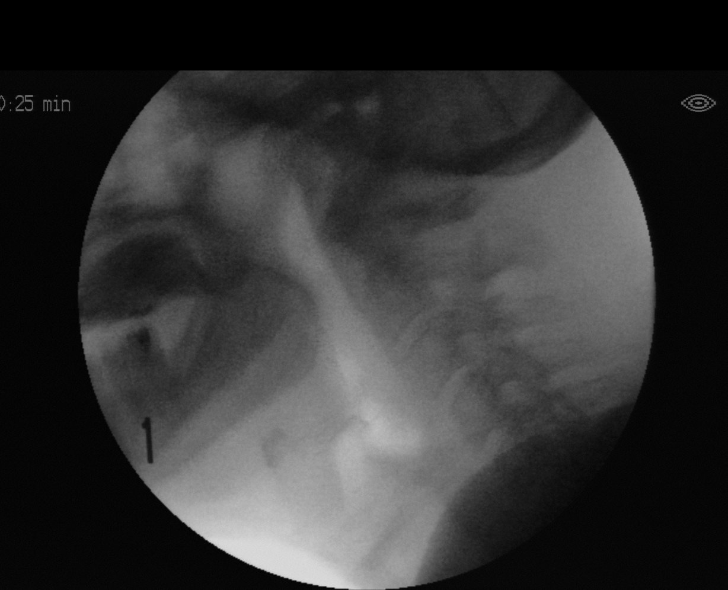

[Series 8: run · 1 of 66 frames shown (5 of 12)]
[frame 57/66]
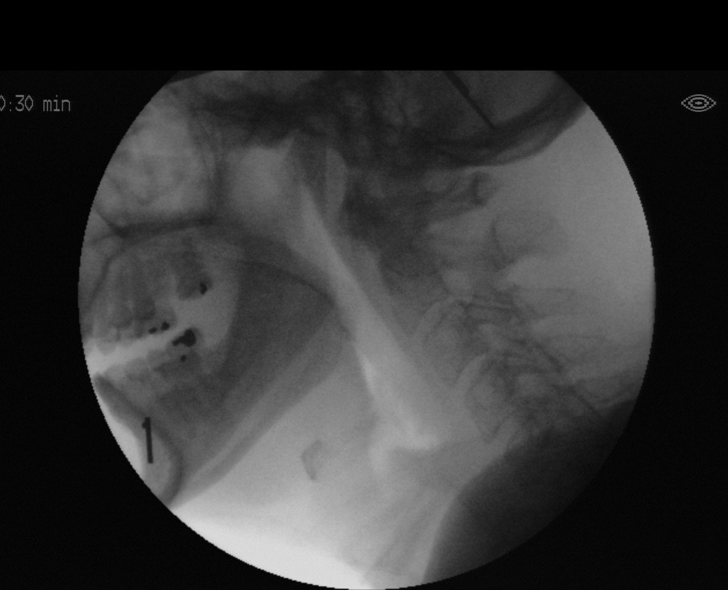

[Series 10: run · 1 of 197 frames shown (6 of 12)]
[frame 30/197]
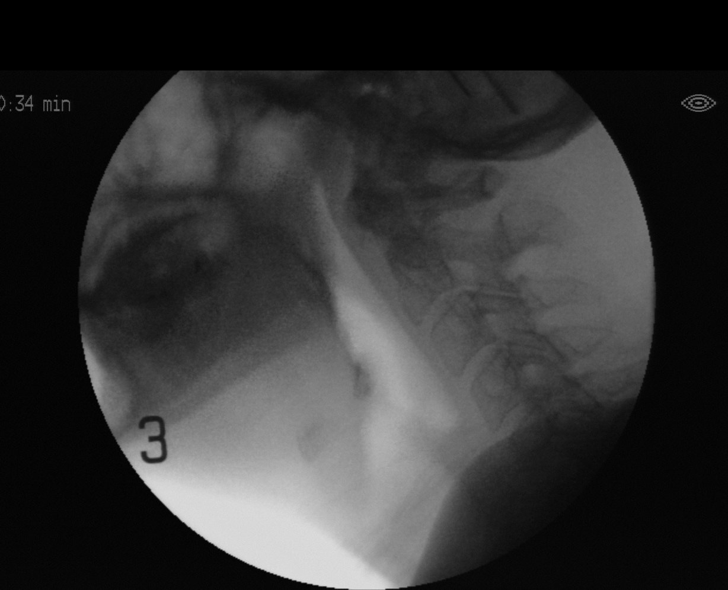

[Series 12: run · 1 of 527 frames shown (7 of 12)]
[frame 80/527]
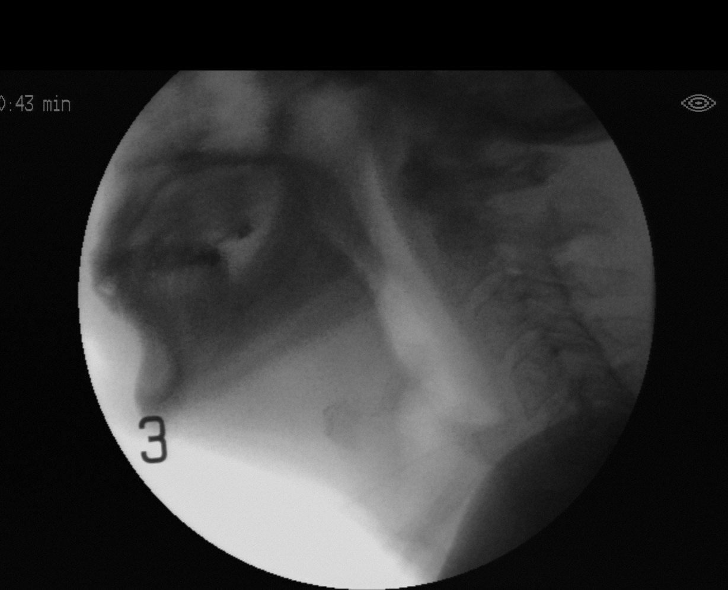

[Series 13: run · 1 of 373 frames shown (8 of 12)]
[frame 318/373]
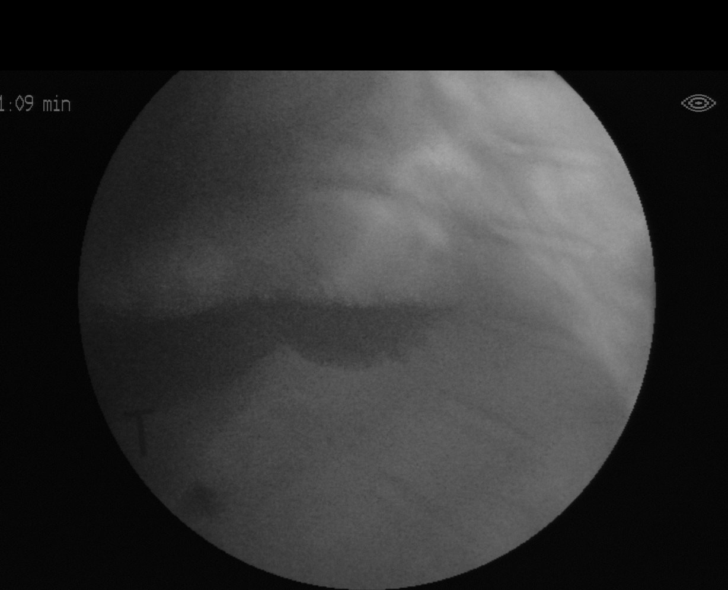

[Series 15: run · 1 of 306 frames shown (9 of 12)]
[frame 154/306]
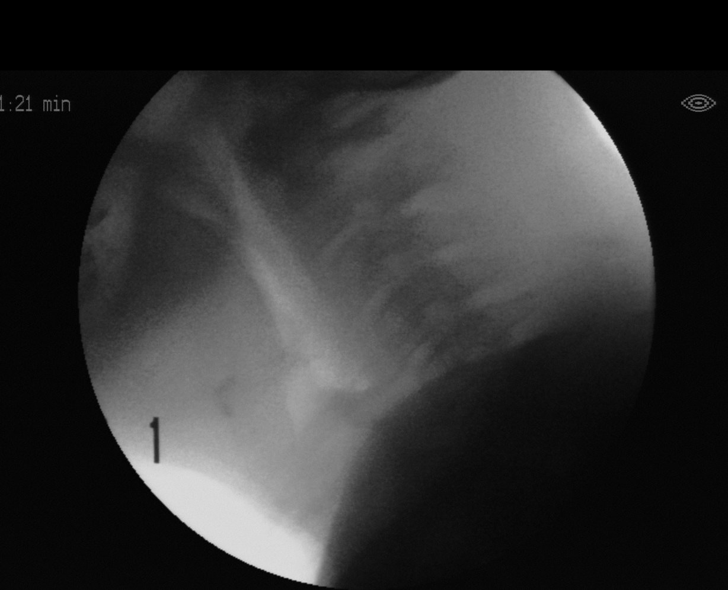

[Series 17: run · 1 of 102 frames shown (10 of 12)]
[frame 52/102]
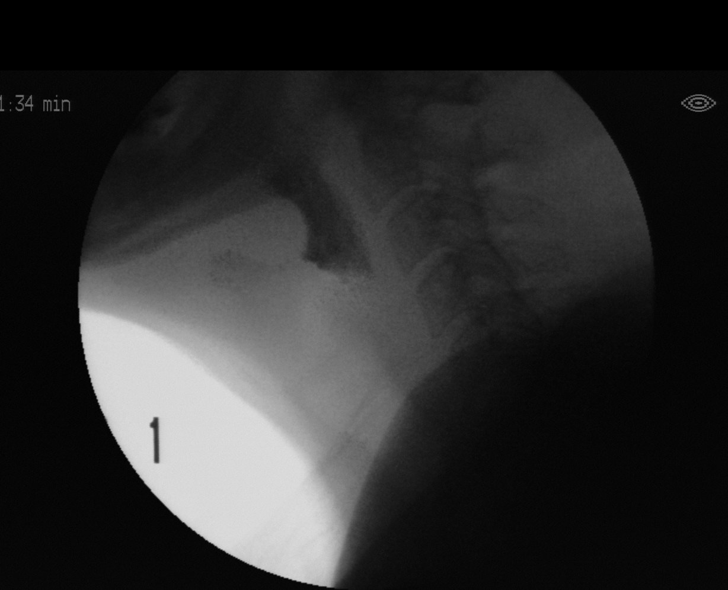

[Series 19: run · 1 of 160 frames shown (11 of 12)]
[frame 9/160]
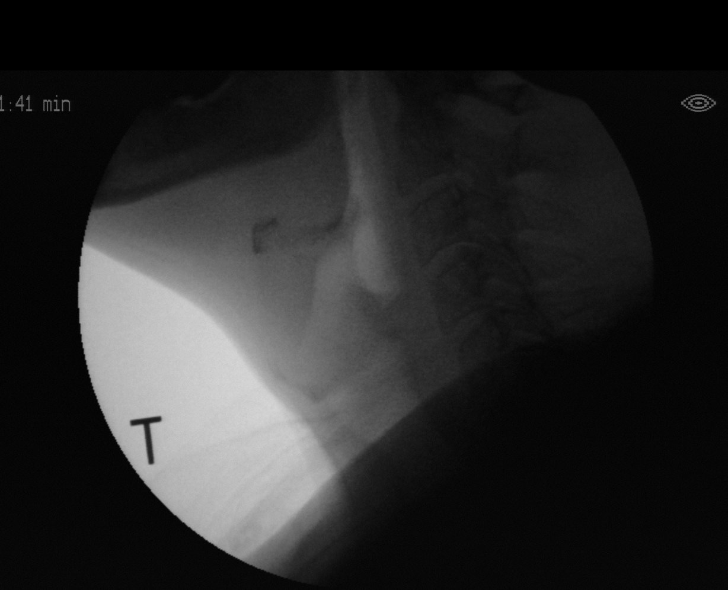

[Series 20: run · 1 of 154 frames shown (12 of 12)]
[frame 131/154]
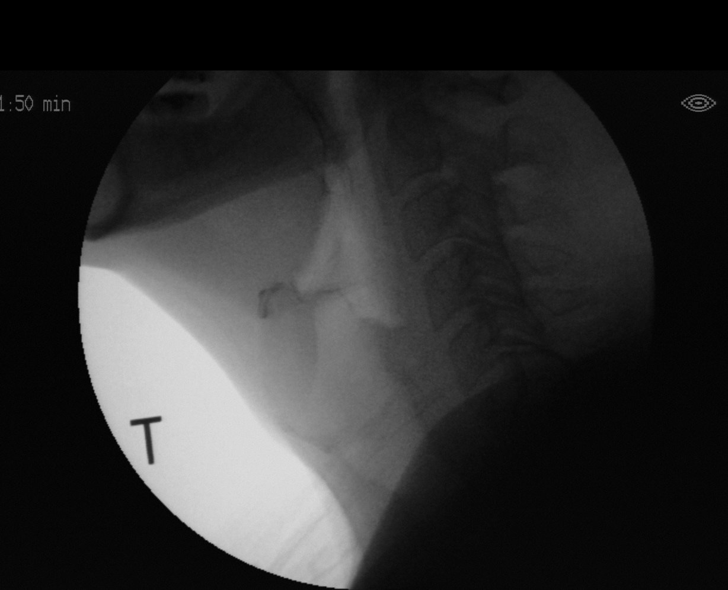

[12 of 24 positions shown; findings below may reference images not displayed]

FINDINGS: Thin liquid- within normal limits

Nectar thick liquid-not given

Honey-not given

Pure-within normal limits

Pure with cracker- within normal limits

Barium tablet mixed with barium-within normal limits
IMPRESSION: Normal swallowing function exam.

Please refer to the Speech Pathologists report for complete details
and recommendations.

## 2018-12-17 ENCOUNTER — Ambulatory Visit (INDEPENDENT_AMBULATORY_CARE_PROVIDER_SITE_OTHER): Payer: Medicare Other | Admitting: Pulmonary Disease

## 2018-12-17 ENCOUNTER — Encounter: Payer: Self-pay | Admitting: Pulmonary Disease

## 2018-12-17 VITALS — BP 128/78 | Ht 71.0 in | Wt 180.2 lb

## 2018-12-17 DIAGNOSIS — R1319 Other dysphagia: Secondary | ICD-10-CM

## 2018-12-17 DIAGNOSIS — R042 Hemoptysis: Secondary | ICD-10-CM | POA: Diagnosis not present

## 2018-12-17 DIAGNOSIS — R1310 Dysphagia, unspecified: Secondary | ICD-10-CM

## 2018-12-17 DIAGNOSIS — J42 Unspecified chronic bronchitis: Secondary | ICD-10-CM | POA: Diagnosis not present

## 2018-12-17 MED ORDER — BUDESONIDE-FORMOTEROL FUMARATE 160-4.5 MCG/ACT IN AERO: 2.0000 | INHALATION_SPRAY | Freq: Two times a day (BID) | RESPIRATORY_TRACT | 0 refills | Status: AC

## 2018-12-17 NOTE — Progress Notes (Signed)
Synopsis: Referred in September 2019 for hemoptysis by Maryan Char, NP  Subjective:   PATIENT ID: Nathaniel [name] GENDER: male DOB: [birth year], MRN: [id number]  Chief Complaint  Patient presents with  . Follow-up    States he is still coughing up blood and it is getting worse. States he has both throar irritation and chest pain on left side only. States he was unable to get the budesonide due to the $15 cost. He states he was given a nebulizer medication by his other MD and it made it worse after using. States after using he was in the bathroom for 2 hours coughing up blood. He states he last used nebulizer yesterday. He did experence some SOB this morning.     Initial OV: Patient was initially seen in July 2019 in the emergency room with complaints of coughing up blood.  This is presumed related to hematemesis and a prior history of Barrett's esophagus.  Patient was treated as acute gastritis with outpatient follow-up EGD.  EGD was completed August 7 with revealed small area of Barrett's.  At that time the decision was made for referral to pulmonary for evaluation hemoptysis.  Chest imaging completed at his evaluation in the ED in July 2019 revealed no acute cardiopulmonary process however visible implanted cardiac loop monitor.  He has been to see by 4 different doctors over the past 1.5 years. He has 3 endoscopies for additional evaluations, some being for follow up on his barratts. The prior has found barratts esophagus again and no identified source of hemoptysis. Sometimes if he coughs really hard he will have blood mucus.  Coughing up of blood usually occurs in the middle of the night when he wakes up to go to the restroom.  He feels as if he has congestion within the left chest and after several bouts of coughing he will bring up and expectorate mucus that contains bright red and sometimes dark flecks of blood.  He is a life long non-smoker. Please see the picture below that gives a  visual to the amount of blood coughed up. Patient denies family history of chronic lung disease.  He does a significant family history for coronary disease as his father has had a heart attack and he has lost his brothers at young age related to cardiovascular disease.   OV 10/09/2018: Patient was taken for bronchoscopy which revealed diffuse distinctly friable mucosa that was hyperemic pink.  Under narrowband imaging there was an extensive network of vasculature suggestive of diffuse of bronchitis and inflamed airways.  There was no obvious evidence of erosion.  BAL cultures revealed normal flora and cytology was negative for malignancy.  Forcep biopsies of 2 small nodules of the anterior wall negative for malignancy.  He has had continued cough and sputum production since his bronchoscopy.  He states that he only has blood appear when he has extensive or violent coughing episodes.  His chest always feels tight and has associated wheezing.  He denies fevers, chills, night sweats.  He is tired and because he is unable to sleep at night due to cough. He does admit to coughing and choking on foods on occasion. He sleeps with 3 pillows.  Today we used the iPad interpreting system for ASL.  OV 10/22/2018: Since her last visit today had a MBSS diagnosed with unspecified dysphagia, possible esophageal dysphasia, mild aspiration risk, speech felt patient was able to maintain current diet and no further work needed with Electrical engineer. Patient was given  information on recommendations for managing esophageal dysphagia.  Also recommended to utilize liquids to facilitate clearance of solids.  Unfortunately, the patient has recently suffered a heartbreaking event in his life.  His wife and son were both involved in a motor vehicle accident and died.  He is currently grieving with this situation.  Unfortunately he has started drinking homemade moonshine to help cope with this.  Patient was counseled on the fact that this  was probably not the best way to deal with the situation.  Additionally, as for his respiratory health he has not been using any of his inhalers.  He did not take the rest of his prednisone due to increased palpitations.  We were unable to start the patient on azithromycin due to his interaction with amiodarone.  And he has not routinely been taking his medication for the treatment of his reflux.  Overall he has not been doing anything that we have asked him to do for his chronic bronchitis.  He did however see a new primary care provider and was started on a antibiotic, doxycycline.  The patient is still having ongoing cough and sputum production.  His cough is mainly worse at night and he is still coughing up blood in the middle the night associated with mucus plugging.  OV 12/17/2018: Patient presents to the office with ongoing symptoms.  Unfortunately he has not been able to do anything that was asked the last time he was seen in the office.  The patient seems very frustrated but his symptoms have been going on for greater than 2 years. He uses a nebulized albuterol which he thinks makes his cough worse.  He has not been able to use any inhalers because he states he is unable to afford them from the pharmacy.  He has not told our office that he has been unable to afford his medications.  He says he use the samples that he was given last time until they ran out.  He does admit that he may have felt better while using them.  He still makes homemade cough syrup with moonshine at home and routinely takes this before bed.  His cough is exacerbated by cold weather so he tries to keep his face comfort.  He showed pictures today in the office again of small flecks of blood in sputum.  He feels like the left side is worse.  This is unchanged from prior.     Past Medical History:  Diagnosis Date  . Allergy   . Arthritis    right knee  . Asthma    prn inhaler  . Barrett's esophagus 10-25-14 pt denies    states "minor"  . Chest pain    a. 06/2016 MV: EF 52%, no ischemia or infarct.  . 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 Deaf    Call [phone] for patient's sign language interpreter.   Marland Kitchen GERD (gastroesophageal reflux disease)   . Heartburn    occasional, related to certain foods  . Hemoptysis 09/20/2018  . High cholesterol   . Intermittent palpitations 10-25-14 patient denies   a. Event Monitor 04/2014: Mostly NSR - intermittent PACs, - 2 brief runs of PAT/PSVT  . PSVT (paroxysmal supraventricular tachycardia) ([place])    a. 04/2014 Event Monitor: brief PAT/PSVT-->uses prn flecainide.  . Seasonal allergies   . Stroke [facility]) 2013   TIA  . TIA (transient ischemic attack) 10-25-14 pt denies   no current deficits  . Vasovagal syncope  Family History  Problem Relation Age of Onset  . Ovarian cancer Mother   . Coronary artery disease Father   . Heart attack Father 29  . Heart attack Brother 82  . Heart attack Paternal Grandfather 51  . Colon cancer Neg Hx   . Stomach cancer Neg Hx   . Esophageal cancer Neg Hx   . Rectal cancer Neg Hx      Past Surgical History:  Procedure Laterality Date  . [place]  08/2015   Ranging from mostly sinus rhythm to occasional sinus bradycardia and sinus tachycardia. Heart rate from 53 bpm to 129 bpm. No documented PVCs or PACs noted. No arrhythmia noted. No PSVT or A. Fib. "Heart racing" noted with sinus tachycardia.  . Abdominal Aortic Ultrasound  2012   normal abdominal aorta  . BUNIONECTOMY Left 2012   right done also  . CARDIAC CATHETERIZATION  06/24/2011   wwidely patent normal coronary arteries with normal ejection fraction  . Carotid Artery Dopplers  2012   tortuous but no stenoses.. No subclavian disease  . COLONOSCOPY  2015  . COLONOSCOPY WITH PROPOFOL N/A 11/03/2014   Procedure: COLONOSCOPY WITH PROPOFOL;  Surgeon: Milus Banister, MD;  Location: [facility];  Service: Endoscopy;  Laterality: N/A;  . Foot Implant  Removal Right 07/15/2013   @ [facility]  . INGUINAL HERNIA REPAIR    . KNEE ARTHROSCOPY Left 06/02/2012  . KNEE ARTHROSCOPY Right fall 2013   x 2  . KNEE ARTHROSCOPY Left 03/03/2013   Procedure: ARTHROSCOPY KNEE WITH CHONDROPLASTY PATELLA  AND LATERAL TIBIAL PLATEAU;  Surgeon: Kerin Salen, MD;  Location: [place];  Service: Orthopedics;  Laterality: Left;  . LOOP RECORDER INSERTION N/A 02/26/2018   Procedure: LOOP RECORDER INSERTION;  Surgeon: Constance Haw, MD;  Location: [place] [facility];  Service: Cardiovascular;  Laterality: N/A;  . NM MYOVIEW LTD  06/2016   No evidence of ischemia or infarction.   Domingo Dimes ECHOCARDIOGRAM  June 2015, August 2018   A) normal EF: 55-60%. No wall motion abnormalities. Gr 1 DD.  Mild MR. Otherwise normal;; b) ([facility]) - Normal LV size and function.  EF 60-65%.  No significant valvular abnormalities.  Marland Kitchen Upper and Lower Extremity Arterial Dopplers  2012   normal arterial flow bilateral upper extremities including subclavian is a carotid;;R. ABI 1.2, L. ABI 1.28. Normal flow velocities. Likely calcified vessels.  Marland Kitchen UPPER GASTROINTESTINAL ENDOSCOPY  2013  . VIDEO BRONCHOSCOPY Bilateral 09/30/2018   Procedure: VIDEO BRONCHOSCOPY WITH FLUORO;  Surgeon: Garner Nash, DO;  Location: [facility];  Service: Cardiopulmonary;  Laterality: Bilateral;    Social History   Socioeconomic History  . Marital status: Married    Spouse name: Not on file  . Number of children: 1  . Years of education: college  . Highest education level: Not on file  Occupational History  . Occupation: disability  Social Needs  . Financial resource strain: Not on file  . Food insecurity:    Worry: Not on file    Inability: Not on file  . Transportation needs:    Medical: Not on file    Non-medical: Not on file  Tobacco Use  . Smoking status: Never Smoker  . Smokeless tobacco: Never Used  Substance and Sexual Activity  . Alcohol use: Never     Alcohol/week: 0.0 standard drinks    Frequency: Never    Comment: occ  . Drug use: No  . Sexual activity:  Not on file  Lifestyle  . Physical activity:    Days per week: Not on file    Minutes per session: Not on file  . Stress: Not on file  Relationships  . Social connections:    Talks on phone: Not on file    Gets together: Not on file    Attends religious service: Not on file    Active member of club or organization: Not on file    Attends meetings of clubs or organizations: Not on file    Relationship status: Not on file  . Intimate partner violence:    Fear of current or ex partner: Not on file    Emotionally abused: Not on file    Physically abused: Not on file    Forced sexual activity: Not on file  Other Topics Concern  . Not on file  Social History Narrative   He is deaf, requiring an interpreter.   He started a new job back in 2013, which was much less stressful for him.  He has subsequently gone on to disability.   He is a married father of one. He previously wrote his bike 2 times a week.   Does not smoke or drink.           Allergies  Allergen Reactions  . Prednisone Palpitations  . Shellfish-Derived Products Itching and Rash  . Shrimp [Shellfish Allergy] Rash and Itching  . Adhesive [Tape] Itching  . Simvastatin Other (See Comments)    DEPRESSION, VISUAL DISTURBANCE(FLASHING LIGHTS)  . Tamsulosin Other (See Comments)    Other reaction(s): Confusion Other reaction(s): Other (See Comments) Changes in behavior  Changes in behavior  Changes in behavior   . Simvastatin - High Dose  [Simvastatin-High Dose]     Depression, behavioral issues  . Fish Allergy Rash and Other (See Comments)    SOFTSHELL FISH  . Fish-Derived Products Rash and Other (See Comments)    SOFTSHELL FISH SOFTSHELL [place]  . Isovue [Iopamidol] Anxiety    Pt c/o chest tightness post injection, respiratory rate and o2 sats normal, evaluated by ED RN and respiratory and PA, given IV  benadryl Pt states he does NOT want contrast in the future     Outpatient Medications Prior to Visit  Medication Sig Dispense Refill  . albuterol (PROVENTIL) (2.5 MG/3ML) 0.083% nebulizer solution Take 3 mLs (2.5 mg total) by nebulization every 6 (six) hours as needed for wheezing or shortness of breath. 75 mL 5  . aspirin EC 81 MG EC tablet Take 1 tablet (81 mg total) by mouth daily. 30 tablet 2  . budesonide (PULMICORT) 0.5 MG/2ML nebulizer solution Take 2 mLs (0.5 mg total) by nebulization 2 (two) times daily. 60 mL 5  . fluticasone (FLONASE) 50 MCG/ACT nasal spray Place 2 sprays into both nostrils daily.    . formoterol (PERFOROMIST) 20 MCG/2ML nebulizer solution Take 2 mLs (20 mcg total) by nebulization 2 (two) times daily. 60 mL 5  . omeprazole (PRILOSEC) 40 MG capsule Take 1 capsule (40 mg total) by mouth 2 (two) times daily. 90 capsule 3  . Spacer/Aero-Holding Chambers (AEROCHAMBER MV) inhaler Use as instructed 1 each 0   No facility-administered medications prior to visit.     Review of Systems  Constitutional: Negative for chills, fever, malaise/fatigue and weight loss.  HENT: Negative for hearing loss, sore throat and tinnitus.   Eyes: Negative for blurred vision and double vision.  Respiratory: Positive for cough, hemoptysis and sputum production. Negative for shortness of  breath, wheezing and stridor.   Cardiovascular: Negative for chest pain, palpitations, orthopnea, leg swelling and PND.  Gastrointestinal: Negative for abdominal pain, constipation, diarrhea, heartburn, nausea and vomiting.  Genitourinary: Negative for dysuria, hematuria and urgency.  Musculoskeletal: Negative for joint pain and myalgias.  Skin: Negative for itching and rash.  Neurological: Negative for dizziness, tingling, weakness and headaches.  Endo/Heme/Allergies: Negative for environmental allergies. Does not bruise/bleed easily.  Psychiatric/Behavioral: Negative for depression. The patient is not  nervous/anxious and does not have insomnia.   All other systems reviewed and are negative.   Objective:  Physical Exam Vitals signs reviewed.  Constitutional:      General: He is not in acute distress.    Appearance: He is well-developed.  HENT:     Head: Normocephalic and atraumatic.  Eyes:     General: No scleral icterus.    Conjunctiva/sclera: Conjunctivae normal.     Pupils: Pupils are equal, round, and reactive to light.  Neck:     Musculoskeletal: Neck supple.     Vascular: No JVD.     Trachea: No tracheal deviation.  Cardiovascular:     Rate and Rhythm: Normal rate and regular rhythm.     Heart sounds: Normal heart sounds. No murmur.  Pulmonary:     Effort: No tachypnea, accessory muscle usage or respiratory distress.     Breath sounds: No stridor. Rhonchi (left chest ) present. No wheezing or rales.  Abdominal:     General: Bowel sounds are normal. There is no distension.     Palpations: Abdomen is soft.     Tenderness: There is no abdominal tenderness.  Musculoskeletal:        General: No tenderness.  Lymphadenopathy:     Cervical: No cervical adenopathy.  Skin:    General: Skin is warm and dry.     Capillary Refill: Capillary refill takes less than 2 seconds.     Findings: No rash.  Neurological:     Mental Status: He is alert and oriented to person, place, and time.  Psychiatric:        Behavior: Behavior normal.      Vitals:   12/17/18 0855  BP: 128/78  Weight: 180 lb 3.2 oz (81.7 kg)  Height: _0  (1.803 m)     on RA BMI Readings from Last 3 Encounters:  12/17/18 25.13 kg/m  11/16/18 24.97 kg/m  10/22/18 24.55 kg/m   Wt Readings from Last 3 Encounters:  12/17/18 180 lb 3.2 oz (81.7 kg)  11/16/18 179 lb (81.2 kg)  10/22/18 176 lb (79.8 kg)    CBC    Component Value Date/Time   WBC 5.8 07/31/2018 1556   RBC 4.23 07/31/2018 1556   HGB 13.0 07/31/2018 1556   HCT 38.6 (L) 07/31/2018 1556   PLT 223.0 07/31/2018 1556   MCV 91.4  07/31/2018 1556   MCH 30.3 07/24/2018 1136   MCHC 33.5 07/31/2018 1556   RDW 13.7 07/31/2018 1556   LYMPHSABS 1.7 07/31/2018 1556   MONOABS 0.4 07/31/2018 1556   EOSABS 0.2 07/31/2018 1556   BASOSABS 0.0 07/31/2018 1556    Chest Imaging: Chest x-ray from July 2019: No acute cardiopulmonary process, no infiltrate  CT chest February 2019: No visible abnormality within the trachea or bronchus, no parenchymal abnormalities except for small areas of groundglass consistent with possible atelectasis  Pulmonary Functions Testing Results: Pending   FeNO: None   Pathology: None   Echocardiogram: None   09/03/2018: - Left ventricle: The cavity size  was normal. Systolic function was   normal. The estimated ejection fraction was in the range of 55%   to 60%. There is hypokinesis of the apicalanteroseptal   myocardium. Doppler parameters are consistent with abnormal left   ventricular relaxation (grade 1 diastolic dysfunction). - Mitral valve: There was trivial regurgitation. - Tricuspid valve: There was mild regurgitation. - Pulmonic valve: There was trivial regurgitation.  Heart Catheterization: None     Assessment & Plan:   Chronic bronchitis, unspecified chronic bronchitis type (HCC)  Cough with hemoptysis  Esophageal dysphagia  Discussion:   This is a 57 year old with chronic bronchitis.  He has significant irritation of the left lung on bronchoscopy.  I believe he has a chronic irritant into the airway.  He has had an M BSS with possible esophageal dysphasia.  He does not routinely take any of his medications for acid suppression.  So I cannot say whether or not he is having reflux into the airway and if treatment would make this better because he is noncompliant with treatment.  I believe that his cough induces mucosal disruption and allows the small airways of the surface to break and bleed.  I believe that is the etiology of his hemoptysis.  We have attempted to reduce  inflammation using a prednisone taper but the patient would not finish it or take the medication due to the way it made him feel.  He was concerned it would make his palpitations worse.  He is not been taking any inhaler medication or inhaled steroids on a regular basis.  He has been given multiple samples.  He only uses them for a few days and then stops.  He has never been on anything long-term.  Also does not take his acid suppressing medications as directed.  Last time we tried to put him on Pulmicort to see if that would make a difference because he did not like using inhalers I thought that maybe we could try nebulized medications.  But he is not been using these to my understanding.  Plan: If he is unwilling to try any of the options that we are trying to make his cough better than I am not sure what else to do.  I explained this to him today.  He does state that he will use the inhalers as needed until he runs out. We will give him samples of Symbicort today because that is what we have available in the office.  We will also give him a prescription and a packet to fill out through [place] and me which is a financial aid application to hopefully help pay for the medication. He was unable to afford the medications from his pharmacy in the past and we will try to help with this. When asked about use of flutter valve he is still not using that but he has 1 at home he says it is because it makes his cough worse. Do not believe he has a bacterial infection.  And I do not think that repeat treatment with antimicrobials will improve this gentleman's cough as it has been going on for 2 years. Unless there are clear sign of infection to include fever and purulent sputum production I would not start antibiotics. Return to clinic in 8 weeks or if symptoms worsen  Greater than 50% of this patient's 40-minute office visit was spent counseling the patient on the above recommendations as well as going over  all of the things that have been done since we first  met each other in July 2019.   Current Outpatient Medications:  .  albuterol (PROVENTIL) (2.5 MG/3ML) 0.083% nebulizer solution, Take 3 mLs (2.5 mg total) by nebulization every 6 (six) hours as needed for wheezing or shortness of breath., Disp: 75 mL, Rfl: 5 .  aspirin EC 81 MG EC tablet, Take 1 tablet (81 mg total) by mouth daily., Disp: 30 tablet, Rfl: 2 .  budesonide (PULMICORT) 0.5 MG/2ML nebulizer solution, Take 2 mLs (0.5 mg total) by nebulization 2 (two) times daily., Disp: 60 mL, Rfl: 5 .  fluticasone (FLONASE) 50 MCG/ACT nasal spray, Place 2 sprays into both nostrils daily., Disp: , Rfl:  .  formoterol (PERFOROMIST) 20 MCG/2ML nebulizer solution, Take 2 mLs (20 mcg total) by nebulization 2 (two) times daily., Disp: 60 mL, Rfl: 5 .  omeprazole (PRILOSEC) 40 MG capsule, Take 1 capsule (40 mg total) by mouth 2 (two) times daily., Disp: 90 capsule, Rfl: 3 .  Spacer/Aero-Holding Chambers (AEROCHAMBER MV) inhaler, Use as instructed, Disp: 1 each, Rfl: 0   Garner Nash, DO [facility] 12/17/2018 9:07 AM

## 2018-12-17 NOTE — Patient Instructions (Addendum)
Thank you for visiting Dr. Valeta Harms at [facility]. Today we recommend the following:  Please let us know if you cannot afford your medications.   Meds ordered this encounter  Medications  . budesonide-formoterol (SYMBICORT) 160-4.5 MCG/ACT inhaler    Sig: Inhale 2 puffs into the lungs every 12 (twelve) hours.    Dispense:  2 Inhaler    Refill:  0   Return in about 2 months (around 02/17/2019).

## 2018-12-21 ENCOUNTER — Ambulatory Visit (INDEPENDENT_AMBULATORY_CARE_PROVIDER_SITE_OTHER): Payer: Medicare Other

## 2018-12-21 DIAGNOSIS — R55 Syncope and collapse: Secondary | ICD-10-CM | POA: Diagnosis not present

## 2018-12-21 LAB — CUP PACEART REMOTE DEVICE CHECK
Implantable Pulse Generator Implant Date: 20190228
MDC IDC SESS DTM: 20191222223831

## 2018-12-24 NOTE — Progress Notes (Signed)
[facility] Summary Report / Loop Recorder 

## 2019-01-10 LAB — CUP PACEART REMOTE DEVICE CHECK
Date Time Interrogation Session: 20191119224042
MDC IDC PG IMPLANT DT: 20190228

## 2019-01-25 ENCOUNTER — Ambulatory Visit (INDEPENDENT_AMBULATORY_CARE_PROVIDER_SITE_OTHER): Payer: Medicare Other

## 2019-01-25 DIAGNOSIS — R55 Syncope and collapse: Secondary | ICD-10-CM | POA: Diagnosis not present

## 2019-01-26 NOTE — Progress Notes (Signed)
[facility] Summary Report / Loop Recorder 

## 2019-01-27 LAB — CUP PACEART REMOTE DEVICE CHECK
Date Time Interrogation Session: 20200124223650
MDC IDC PG IMPLANT DT: 20190228

## 2019-01-27 NOTE — Telephone Encounter (Signed)
Forwarding request to medical records dept to send medical records to [facility] in [place], [place] for patient to establish care with their office.

## 2019-02-02 NOTE — Telephone Encounter (Signed)
No phone number on file for patient. Email sent to patient 01/29/19 requesting he contact medical records to confirm/clarify requested information.

## 2019-02-10 ENCOUNTER — Telehealth: Payer: Self-pay

## 2019-02-10 NOTE — Telephone Encounter (Signed)
Patient called and states that he needs a refill on Amiodarone 100MG  1 tablet once a day. I looked through patients chart and saw that he was on Flecainide and that was switched to Amiodarone. Then I saw where Amiodarone was stopped and Mexiletine was started. But, none of these medications are on patients list. He moved to [place] last week. He states that he dropped his Amiodarone in the sink and ruined the bottle and needs a refill. Patient wants medication sent to [facility] in [place]; phone number for pharmacy is [phone]. Patient can be reached through [facility] and his phone number is [phone].

## 2019-02-17 ENCOUNTER — Ambulatory Visit: Payer: Self-pay | Admitting: Pulmonary Disease

## 2019-02-24 ENCOUNTER — Encounter: Payer: Medicaid Other | Admitting: *Deleted

## 2019-03-01 ENCOUNTER — Other Ambulatory Visit: Payer: Self-pay | Admitting: Cardiology

## 2019-03-04 ENCOUNTER — Other Ambulatory Visit: Payer: Self-pay | Admitting: Cardiology

## 2019-03-08 ENCOUNTER — Other Ambulatory Visit: Payer: Self-pay | Admitting: Cardiology

## 2019-03-08 ENCOUNTER — Other Ambulatory Visit: Payer: Self-pay

## 2019-03-08 MED ORDER — AMIODARONE HCL 100 MG PO TABS: 100.0000 mg | ORAL_TABLET | Freq: Every day | ORAL | 0 refills | Status: DC

## 2019-03-08 NOTE — Telephone Encounter (Signed)
Pt is call requesting a refill on AmiodaronePer Trinidad Curet, RN that it is okay to send patient a refill for 2 months on Amiodarone 100 mg daily. Pt has moved to [place] and has not found a cardiologist yet.

## 2019-03-08 NOTE — Telephone Encounter (Signed)
Patient call requesting refill on Amiodarone. This medication is not on patient's list. I attempted to call patient back to confirm which medication he needed a refill on but there was no answer. Left voicemail by interpreter telling the patient to give our office a call back.

## 2019-03-08 NOTE — Telephone Encounter (Signed)
° ° ° °  Please see 2/12 telephone encounter. Patient calling to request reill   1. Which medications need to be refilled? (please list name of each medication and dose if known)  Amiodarone  2. Which pharmacy/location (including street and city if local pharmacy) is medication to be sent to? [facility] # [place]  3. Do they need a 30 day or 90 day supply? [place]

## 2019-06-15 ENCOUNTER — Encounter: Payer: Self-pay | Admitting: *Deleted

## 2019-07-14 ENCOUNTER — Telehealth: Payer: Self-pay | Admitting: Cardiology

## 2019-07-14 NOTE — Telephone Encounter (Signed)
New Message        COVID-19 Pre-Screening Questions:   In the past 7 to 10 days have you had a cough,  shortness of breath, headache, congestion, fever (100 or greater) body aches, chills, sore throat, or sudden loss of taste or sense of smell? NO  Have you been around anyone with known Covid 19. NO  Have you been around anyone who is awaiting Covid 19 test results in the past 7 to 10 days? PT had COVID test and results came back NEG   Have you been around anyone who has been exposed to Covid 19, or has mentioned symptoms of Covid 19 within the past 7 to 10 days? NO  Pt needs interpreter   If you have any concerns/questions about symptoms patients report during screening (either on the phone or at [facility]). Contact the provider seeing the patient or DOD for further guidance.  If neither are available contact a member of the leadership team.

## 2019-07-15 ENCOUNTER — Telehealth: Payer: Self-pay | Admitting: Internal Medicine

## 2019-07-15 NOTE — Telephone Encounter (Signed)
° °  Patient is unable to make his appointment tomorrow, 07/16/19 due to car trouble.   He was hoping to have Dr. Caryl Comes fax over his medical history to another doctor, so that the patient's history will be available for the other doctor to go over. Another heart doctor will be doing a procedure on the patient.  The patient has tried to communicate with the other doctor, but there have been some misinterpretations. The new doctor would just like to know the patient's medical history.  The new doctor is Dr. Loma Newton Phone: [phone]  Patient did not know the fax number

## 2019-07-16 ENCOUNTER — Encounter: Payer: Medicaid Other | Admitting: Cardiology

## 2019-07-16 NOTE — Telephone Encounter (Signed)
Sent to medical records to send records to new doctor in [place]

## 2019-07-20 NOTE — Telephone Encounter (Signed)
Medical records faxed to [facility] at [phone] on 07/19/19.

## 2019-10-07 ENCOUNTER — Encounter: Payer: Self-pay | Admitting: Gastroenterology

## 2019-11-22 ENCOUNTER — Other Ambulatory Visit: Payer: Self-pay | Admitting: Gastroenterology

## 2020-03-20 ENCOUNTER — Ambulatory Visit: Payer: Self-pay | Admitting: Cardiology

## 2020-05-26 ENCOUNTER — Other Ambulatory Visit: Payer: Self-pay | Admitting: Gastroenterology

## 2020-07-06 ENCOUNTER — Encounter: Payer: Self-pay | Admitting: Cardiology

## 2020-07-08 ENCOUNTER — Other Ambulatory Visit: Payer: Self-pay | Admitting: Cardiology

## 2020-07-12 NOTE — Telephone Encounter (Signed)
Pt seeing cardiologist in [place] (where he currently resides), refill request should go through his current cardiologist in [place].

## 2020-09-07 ENCOUNTER — Other Ambulatory Visit: Payer: Self-pay | Admitting: Gastroenterology

## 2021-10-08 ENCOUNTER — Encounter: Payer: Self-pay | Admitting: Gastroenterology

## 2021-11-05 ENCOUNTER — Telehealth: Payer: Self-pay

## 2021-11-05 NOTE — Telephone Encounter (Signed)
Unscheduled transmission received Battery EOS 9/27 Route to triage  Called patient to advise device has reached EOS 09/25/21. No answer, LMTCB.

## 2021-11-07 NOTE — Telephone Encounter (Signed)
22nd attempt to contact patient. No answer, LMTCB.

## 2021-11-12 NOTE — Telephone Encounter (Signed)
Left message through video phone interpreter # [phone] as patient is deaf and needs interpreter for patient to call [facility] back regarding EOS status or to send a mychart message.

## 2021-11-13 NOTE — Telephone Encounter (Signed)
I let the patient speak with Benjamine Mola, rn. He wanted some recommendations on where to live in [place].

## 2021-11-13 NOTE — Telephone Encounter (Signed)
Patient hung up while I was trying to get the nurse on the phone.

## 2021-11-13 NOTE — Telephone Encounter (Signed)
LMOVM for interpreter to call the patient to have him call the device clinic. The interpreter number is [phone].

## 2021-11-16 NOTE — Telephone Encounter (Addendum)
Patient agreeable to appointment on 12/03/21 at 0900AM with Oda Kilts.
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
## 2021-12-03 ENCOUNTER — Encounter: Payer: Medicare HMO | Admitting: Student

## 2021-12-03 ENCOUNTER — Other Ambulatory Visit: Payer: Self-pay

## 2021-12-03 ENCOUNTER — Encounter: Payer: Self-pay | Admitting: Student

## 2021-12-03 ENCOUNTER — Ambulatory Visit (INDEPENDENT_AMBULATORY_CARE_PROVIDER_SITE_OTHER): Payer: Medicare Other | Admitting: Student

## 2021-12-03 VITALS — BP 138/98 | HR 93 | Ht 71.0 in | Wt 176.0 lb

## 2021-12-03 DIAGNOSIS — I471 Supraventricular tachycardia: Secondary | ICD-10-CM | POA: Diagnosis not present

## 2021-12-03 DIAGNOSIS — I493 Ventricular premature depolarization: Secondary | ICD-10-CM | POA: Diagnosis not present

## 2021-12-03 DIAGNOSIS — R55 Syncope and collapse: Secondary | ICD-10-CM | POA: Diagnosis not present

## 2021-12-03 NOTE — Patient Instructions (Signed)
Medication Instructions:  Your physician recommends that you continue on your current medications as directed. Please refer to the Current Medication list given to you today.  *If you need a refill on your cardiac medications before your next appointment, please call your pharmacy*   Lab Work: None  If you have labs (blood work) drawn today and your tests are completely normal, you will receive your results only by: [place] (if you have MyChart) OR A paper copy in the mail If you have any lab test that is abnormal or we need to change your treatment, we will call you to review the results.   Follow-Up: At [facility], you and your health needs are our priority.  As part of our continuing mission to provide you with exceptional heart care, we have created designated Provider Care Teams.  These Care Teams include your primary Cardiologist (physician) and Advanced Practice Providers (APPs -  Physician Assistants and Nurse Practitioners) who all work together to provide you with the care you need, when you need it.   Your next appointment:   01/18/2021  The format for your next appointment:   In Person  Provider:   Will Meredith Leeds, MD

## 2022-01-18 ENCOUNTER — Ambulatory Visit (INDEPENDENT_AMBULATORY_CARE_PROVIDER_SITE_OTHER): Payer: Medicare Other | Admitting: Cardiology

## 2022-01-18 ENCOUNTER — Other Ambulatory Visit: Payer: Self-pay

## 2022-01-18 ENCOUNTER — Encounter: Payer: Self-pay | Admitting: Cardiology

## 2022-01-18 VITALS — BP 106/70 | HR 86 | Ht 71.0 in | Wt 178.0 lb

## 2022-01-18 DIAGNOSIS — I493 Ventricular premature depolarization: Secondary | ICD-10-CM

## 2022-01-18 DIAGNOSIS — R55 Syncope and collapse: Secondary | ICD-10-CM | POA: Diagnosis not present

## 2022-01-18 DIAGNOSIS — I471 Supraventricular tachycardia: Secondary | ICD-10-CM | POA: Diagnosis not present

## 2022-01-18 NOTE — Patient Instructions (Addendum)
Medication Instructions:  Your physician recommends that you continue on your current medications as directed. Please refer to the Current Medication list given to you today.  *If you need a refill on your cardiac medications before your next appointment, please call your pharmacy*   Lab Work: None ordered   Testing/Procedures: None ordered   Follow-Up: At [facility], you and your health needs are our priority.  As part of our continuing mission to provide you with exceptional heart care, we have created designated Provider Care Teams.  These Care Teams include your primary Cardiologist (physician) and Advanced Practice Providers (APPs -  Physician Assistants and Nurse Practitioners) who all work together to provide you with the care you need, when you need it.  Your next appointment:   3 month(s)  The format for your next appointment:   In Person  Provider:   You will see one of the following Advanced Practice Providers on your designated Care Team:   Tommye Standard, [place] Legrand Como "Jonni Sanger" Chalmers Cater, [place]     Thank you for choosing [facility]!!   Trinidad Curet, RN [phone]   Other Instructions   Implantable Loop Recorder Removal, Care After This sheet gives you information about how to care for yourself after your procedure. Your health care provider may also give you more specific instructions. If you have problems or questions, contact your health care provider. What can I expect after the procedure? After the procedure, it is common to have: Soreness or discomfort near the incision. Some swelling or bruising near the incision.  Follow these instructions at home: Incision care   Leave your outer dressing on for 72 hours.  After 72 hours you can remove your outer dressing and shower. Leave adhesive strips in place. These skin closures may need to stay in place for 1-2 weeks. If adhesive strip edges start to loosen and curl up, you may trim the loose edges.   You may remove the strips if they have not fallen off after 2 weeks. Check your incision area every day for signs of infection. Check for: Redness, swelling, or pain. Fluid or blood. Warmth. Pus or a bad smell. Do not take baths, swim, or use a hot tub until your incision is completely healed. If your wound site starts to bleed apply pressure.      If you have any questions/concerns please call the device clinic at [phone].  Activity  Return to your normal activities.  Contact a health care provider if: You have redness, swelling, or pain around your incision. You have a fever.

## 2022-01-18 NOTE — Progress Notes (Signed)
Electrophysiology Office Note   Date:  01/18/2022   ID:  Nathaniel [name], DOB [birth year], MRN [id number]  PCP:  Maryan Char, NP  Cardiologist:  Ellyn Hack Primary Electrophysiologist:  Sharnetta Gielow Meredith Leeds, MD    No chief complaint on file.    History of Present Illness: Nathaniel [name] is a 61 y.o. male who is being seen today for the evaluation of syncope at the request of Maryan Char, NP. Presenting today for electrophysiology evaluation.    He has a history of hyperlipidemia, SVT, CVA.  He was on as needed flecainide.  He is deaf and uses sign language interpreter.  He had been on flecainide since 2017.  He was mid to the hospital August 2018 noted to have SVT.  Flecainide was increased at the time.  He presented to the emergency room August 31 due to palpitations.  He took an extra flecainide with no benefit.  Linq monitor was implanted 02/26/2018.  He presents today for a Linq monitor explant.  Today, denies symptoms of palpitations, chest pain, shortness of breath, orthopnea, PND, lower extremity edema, claudication, dizziness, presyncope, syncope, bleeding, or neurologic sequela. The patient is tolerating medications without difficulties.  He is feeling well today.  He has intermittent palpitations.  He is quite concerned as he had got tased by the police in [place].  This was a very traumatic experience for him as they would not get a Optometrist.  Due to this, he has moved back to [place].   Past Medical History:  Diagnosis Date   Allergy    Arthritis    right knee   Asthma    prn inhaler   Barrett's esophagus 10-25-14 pt denies   states "minor"   Chest pain    a. 06/2016 MV: EF 52%, no ischemia or infarct.   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    GERD (gastroesophageal reflux disease)    Heartburn    occasional, related to certain foods   Hemoptysis 09/20/2018   High cholesterol     Intermittent palpitations 10-25-14 patient denies   a. Event Monitor 04/2014: Mostly NSR - intermittent PACs, - 2 brief runs of PAT/PSVT   PSVT (paroxysmal supraventricular tachycardia) ([place])    a. 04/2014 Event Monitor: brief PAT/PSVT-->uses prn flecainide.   Seasonal allergies    Stroke [facility]) 2013   TIA   TIA (transient ischemic attack) 10-25-14 pt denies   no current deficits   Vasovagal syncope    Past Surgical History:  Procedure Laterality Date   61 DAY CARDIAC EVENT MONITOR  08/2015   Ranging from mostly sinus rhythm to occasional sinus bradycardia and sinus tachycardia. Heart rate from 53 bpm to 129 bpm. No documented PVCs or PACs noted. No arrhythmia noted. No PSVT or A. Fib. "Heart racing" noted with sinus tachycardia.   Abdominal Aortic Ultrasound  2012   normal abdominal aorta   BUNIONECTOMY Left 2012   right done also   CARDIAC CATHETERIZATION  06/24/2011   wwidely patent normal coronary arteries with normal ejection fraction   Carotid Artery Dopplers  2012   tortuous but no stenoses.. No subclavian disease   COLONOSCOPY  2015   COLONOSCOPY WITH PROPOFOL N/A 11/03/2014   Procedure: COLONOSCOPY WITH PROPOFOL;  Surgeon: Milus Banister, MD;  Location: [facility];  Service: Endoscopy;  Laterality: N/A;   Foot Implant Removal Right 07/15/2013   @ [facility]   INGUINAL  HERNIA REPAIR     KNEE ARTHROSCOPY Left 06/02/2012   KNEE ARTHROSCOPY Right fall 2013   x 2   KNEE ARTHROSCOPY Left 03/03/2013   Procedure: ARTHROSCOPY KNEE WITH CHONDROPLASTY PATELLA  AND LATERAL TIBIAL PLATEAU;  Surgeon: Kerin Salen, MD;  Location: [place];  Service: Orthopedics;  Laterality: Left;   LOOP RECORDER INSERTION N/A 02/26/2018   Procedure: LOOP RECORDER INSERTION;  Surgeon: Constance Haw, MD;  Location: [place] [facility];  Service: Cardiovascular;  Laterality: N/A;   NM MYOVIEW LTD  06/2016   No evidence of ischemia or infarction.    TRANSTHORACIC ECHOCARDIOGRAM  June 2015,  August 2018   A) normal EF: 55-60%. No wall motion abnormalities. Gr 1 DD.  Mild MR. Otherwise normal;; b) ([facility]) - Normal LV size and function.  EF 60-65%.  No significant valvular abnormalities.   Upper and Lower Extremity Arterial Dopplers  2012   normal arterial flow bilateral upper extremities including subclavian is a carotid;;R. ABI 1.2, L. ABI 1.28. Normal flow velocities. Likely calcified vessels.   UPPER GASTROINTESTINAL ENDOSCOPY  2013   VIDEO BRONCHOSCOPY Bilateral 09/30/2018   Procedure: VIDEO BRONCHOSCOPY WITH FLUORO;  Surgeon: Garner Nash, DO;  [facility];  Service: Cardiopulmonary;  Laterality: Bilateral;     Current Outpatient Medications  Medication Sig Dispense Refill   acetaminophen-codeine (TYLENOL #3) 300-30 MG tablet Take 1 tablet by mouth daily as needed for pain.     albuterol (PROVENTIL) (2.5 MG/3ML) 0.083% nebulizer solution Take 3 mLs (2.5 mg total) by nebulization every 6 (six) hours as needed for wheezing or shortness of breath. 75 mL 5   amiodarone (PACERONE) 100 MG tablet Take 1 tablet (100 mg total) by mouth daily. 90 tablet 0   aspirin EC 81 MG EC tablet Take 1 tablet (81 mg total) by mouth daily. 30 tablet 2   atorvastatin (LIPITOR) 10 MG tablet Take 10 mg by mouth daily.     budesonide (PULMICORT) 0.5 MG/2ML nebulizer solution Take 2 mLs (0.5 mg total) by nebulization 2 (two) times daily. 60 mL 5   budesonide-formoterol (SYMBICORT) 160-4.5 MCG/ACT inhaler Inhale 2 puffs into the lungs every 12 (twelve) hours. 2 Inhaler 0   fluticasone (FLONASE) 50 MCG/ACT nasal spray Place 2 sprays into both nostrils daily.     formoterol (PERFOROMIST) 20 MCG/2ML nebulizer solution Take 2 mLs (20 mcg total) by nebulization 2 (two) times daily. 60 mL 5   nebivolol (BYSTOLIC) 5 MG tablet Take 5 mg by mouth daily.     nitroGLYCERIN (NITROSTAT) 0.4 MG SL tablet Place 1 tablet under the tongue every 5 (five) minutes x 3 doses as needed for chest pain.      omeprazole (PRILOSEC) 40 MG capsule TAKE ONE CAPSULE BY MOUTH DAILY 30 capsule 1   Spacer/Aero-Holding Chambers (AEROCHAMBER MV) inhaler Use as instructed 1 each 0   No current facility-administered medications for this visit.    Allergies:   Prednisone, Shellfish-derived products, Shrimp [shellfish allergy], Adhesive [tape], Simvastatin, Tamsulosin, Simvastatin - high dose  [simvastatin-high dose], Fish allergy, Fish-derived products, and Isovue [iopamidol]   Social History:  The patient  reports that he has never smoked. He has never used smokeless tobacco. He reports that he does not drink alcohol and does not use drugs.   Family History:  The patient's family history includes Coronary artery disease in his father; Heart attack (age of onset: 71) in his brother; Heart attack (age of onset: 76) in his paternal grandfather;  Heart attack (age of onset: 1) in his father; Ovarian cancer in his mother.   ROS:  Please see the history of present illness.   Otherwise, review of systems is positive for none.   All other systems are reviewed and negative.   PHYSICAL EXAM: VS:  BP 106/70    Pulse 86    Ht 5\' 11"  (1.803 m)    Wt 178 lb (80.7 kg)    SpO2 96%    BMI 24.83 kg/m  , BMI Body mass index is 24.83 kg/m. GEN: Well nourished, well developed, in no acute distress  HEENT: normal  Neck: no JVD, carotid bruits, or masses Cardiac: RRR; no murmurs, rubs, or gallops,no edema  Respiratory:  clear to auscultation bilaterally, normal work of breathing GI: soft, nontender, nondistended, + BS MS: no deformity or atrophy  Skin: warm and dry, device site well healed Neuro:  Strength and sensation are intact Psych: euthymic mood, full affect  EKG:  EKG is not ordered today. Personal review of the ekg ordered 12/03/21 shows sinus rhythm, rate 93  Personal review of the device interrogation today. Results in [place]: No results found for requested labs within last 8760 hours.    Lipid  Panel     Component Value Date/Time   CHOL 166 07/10/2016 0158   TRIG 180 (H) 07/10/2016 0158   HDL 26 (L) 07/10/2016 0158   CHOLHDL 6.4 07/10/2016 0158   VLDL 36 07/10/2016 0158   LDLCALC 104 (H) 07/10/2016 0158     Wt Readings from Last 3 Encounters:  01/18/22 178 lb (80.7 kg)  12/03/21 176 lb (79.8 kg)  12/17/18 180 lb 3.2 oz (81.7 kg)      Other studies Reviewed: Additional studies/ records that were reviewed today include: TTE 08/2018  Review of the above records today demonstrates:  - Left ventricle: The cavity size was normal. Systolic function was   normal. The estimated ejection fraction was in the range of 55%   to 60%. There is hypokinesis of the apicalanteroseptal   myocardium. Doppler parameters are consistent with abnormal left   ventricular relaxation (grade 1 diastolic dysfunction). - Mitral valve: There was trivial regurgitation. - Tricuspid valve: There was mild regurgitation. - Pulmonic valve: There was trivial regurgitation.  30 day monitor 10/08/17 - personally reviewed Mostly normal sinus rhythm with occasional sinus tachycardia and sinus arrhythmia. Symptoms (chest pain and tightness) were noted with sinus rhythm, sinus tachycardia, sinus arrhythmia. Very rare PVCs noted. 1 short run of 8-10 beats wide-complex tachycardia noted a symptomatic, but not long enough to cause any significant fin symptoms dings.  ASSESSMENT AND PLAN:  1.  Syncope: Linq monitor in place.  Device is reached ERI.  For explant at this time.  Risks and benefits of been discussed.  Risk of bleeding and infection.  He understands these risks and is agreed to the procedure.  2.  PVCs: On no medications.  We Cherron Blitzer continue plan per primary cardiology.  3.  SVT: On no antiarrhythmics.  Has had no further episodes.  No changes.   Current medicines are reviewed at length with the patient today.   The patient does not have concerns regarding his medicines.  The following changes were  made today: none  Labs/ tests ordered today include:  No orders of the defined types were placed in this encounter.    Disposition:   FU with EP APP 6 months  Signed, Gwyndolyn Guilford Meredith Leeds, MD  01/18/2022 10:07 AM  [place] [address] [phone] (office) [phone] (fax)   SURGEON:  Allegra Lai, MD     PREPROCEDURE DIAGNOSIS:  syncope    POSTPROCEDURE DIAGNOSIS:  syncope     PROCEDURES:   1. Implantable loop recorder implantation    INTRODUCTION:  EUSTACE HUR is a 61 y.o. male with a history of unexplained stroke who presents today for implantable loop explant.  he has had a LINQ monitor.  LINQ is now at [facility] and wishes the monitor explanted.     DESCRIPTION OF PROCEDURE:  Informed written consent was obtained, and the patient was brought to the electrophysiology lab in a fasting state.  The patient required no sedation for the procedure today.  The patients left chest was therefore prepped and draped in the usual sterile fashion by the EP lab staff. The skin overlying the left parasternal region was infiltrated with lidocaine for local analgesia.  A 0.5-cm incision was made over the left parasternal region over the existing LINQ monitor.  Using a combination of sharp and blount dissection, the LINQ monitor was removed from the pocket.  Steri- Strips and a sterile dressing were then applied.  There were no early apparent complications.     CONCLUSIONS:   1. Successful explant of a [employer] Reveal LINQ implantable loop recorder for syncope  2. No early apparent complications.

## 2022-04-16 ENCOUNTER — Telehealth: Payer: Self-pay | Admitting: Cardiology

## 2022-04-16 NOTE — Telephone Encounter (Signed)
Left message to call back  

## 2022-04-16 NOTE — Telephone Encounter (Signed)
Pt wanting to ensure that nurse received e-mail that he sent on Sunday regarding results. Also pt rescheduled appt. Plerase advise ?

## 2022-04-18 ENCOUNTER — Encounter: Payer: Medicare Other | Admitting: Physician Assistant

## 2022-05-14 ENCOUNTER — Telehealth: Payer: Self-pay | Admitting: Physician Assistant

## 2022-05-14 ENCOUNTER — Telehealth: Payer: Self-pay | Admitting: Cardiology

## 2022-05-14 NOTE — Telephone Encounter (Signed)
Patient calling to inform he will be sending a fax with information from his stay at [facility].  ?

## 2022-05-14 NOTE — Telephone Encounter (Signed)
New Message: ? ? ?Patient wants Joseph Art and Raquel Sarna to know he received the information about the Congested Heart Failure. He also wanted you to know that he was in the Hospital(05-11-22 until Monday(05-13-22. ?

## 2022-05-14 NOTE — Progress Notes (Deleted)
Cardiology Office Note Date:  05/14/2022  Patient ID:  Nathaniel [name], [place] [birth year], MRN [id number] PCP:  Maryan Char, NP  Cardiologist:  Dr. Ellyn Hack Electrophysiologist: Dr. Curt Bears  ***refresh   Chief Complaint: *** planned f/u ?  History of Present Illness: Nathaniel [name] is a 61 y.o. male with history of barrett's esophagus, HLD, SVT, TIA, stroke, syncope  Deaf: uses sign interpretor   LHC 06/2020 showing normal right dominant coronary artery system with minor luminal irregularity in the LAD. Normal LVEF, LVEDP   Nov 2022 ER visit for crushing CP, associated palpitations. Also reported epistaxis (resolved), SOB, vomiting. No fever, cough. Reports chronic right sided numbness/weakness/cold sensation, unchanged since February. Of note, pt is currently in police custody, officer at bedside.  In the ED, CTA C/A/P showed no acute aortic process. Troponin <0.012 x3. EKG NSR without ischemic change. CT head no acute findings. Placing in observation.  11/5: chest pain has completely resolved and he is eager to return home Workup including CTA chest was negative but showed a moderate hiatal hernia so have increased PPI to bid.  He saw A. Chalmers Cater, PA-C Dec 2022, moved from [place], unclear hx of syncope, worried perhaps being "tazed" by the police may have cause loop to reach EOS Noted mentions that he had syncope during loop monitoring which showed NSR. Cause of syncope does not appear to have been identified.   He comes in today to be seen for Dr. Curt Bears, last seen by him Jan 2023, discussed hx of SVT historically prn Flecainide, though with increased dosing not effective and stopped.  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  Note mentions known PVCs as well. Discusses that he was no longer on AAD.   *** ??? Emal sent to [facility]? *** symptoms *** no CAD, normal LV *** amiodarone????  Who started, when?   Past Medical History:  Diagnosis Date   Allergy     Arthritis    right knee   Asthma    prn inhaler   Barrett's esophagus 10-25-14 pt denies   states "minor"   Chest pain    a. 06/2016 MV: EF 52%, no ischemia or infarct.   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    GERD (gastroesophageal reflux disease)    Heartburn    occasional, related to certain foods   Hemoptysis 09/20/2018   High cholesterol    Intermittent palpitations 10-25-14 patient denies   a. Event Monitor 04/2014: Mostly NSR - intermittent PACs, - 2 brief runs of PAT/PSVT   PSVT (paroxysmal supraventricular tachycardia) ([place])    a. 04/2014 Event Monitor: brief PAT/PSVT-->uses prn flecainide.   Seasonal allergies    Stroke [facility]) 2013   TIA   TIA (transient ischemic attack) 10-25-14 pt denies   no current deficits   Vasovagal syncope     Past Surgical History:  Procedure Laterality Date   61 DAY CARDIAC EVENT MONITOR  08/2015   Ranging from mostly sinus rhythm to occasional sinus bradycardia and sinus tachycardia. Heart rate from 53 bpm to 129 bpm. No documented PVCs or PACs noted. No arrhythmia noted. No PSVT or A. Fib. "Heart racing" noted with sinus tachycardia.   Abdominal Aortic Ultrasound  2012   normal abdominal aorta   BUNIONECTOMY Left 2012   right done also   CARDIAC CATHETERIZATION  06/24/2011   wwidely patent normal coronary arteries with normal ejection fraction  Carotid Artery Dopplers  2012   tortuous but no stenoses.. No subclavian disease   COLONOSCOPY  2015   COLONOSCOPY WITH PROPOFOL N/A 11/03/2014   Procedure: COLONOSCOPY WITH PROPOFOL;  Surgeon: Milus Banister, MD;  [facility];  Service: Endoscopy;  Laterality: N/A;   Foot Implant Removal Right 07/15/2013   @ [place]   INGUINAL HERNIA REPAIR     KNEE ARTHROSCOPY Left 06/02/2012   KNEE ARTHROSCOPY Right fall 2013   x 2   KNEE ARTHROSCOPY Left 03/03/2013   Procedure: ARTHROSCOPY KNEE WITH CHONDROPLASTY PATELLA  AND LATERAL  TIBIAL PLATEAU;  Surgeon: Kerin Salen, MD;  Location: [place];  Service: Orthopedics;  Laterality: Left;   LOOP RECORDER INSERTION N/A 02/26/2018   Procedure: LOOP RECORDER INSERTION;  Surgeon: Constance Haw, MD;  Location: [place] [facility];  Service: Cardiovascular;  Laterality: N/A;   NM MYOVIEW LTD  06/2016   No evidence of ischemia or infarction.    TRANSTHORACIC ECHOCARDIOGRAM  June 2015, August 2018   A) normal EF: 55-60%. No wall motion abnormalities. Gr 1 DD.  Mild MR. Otherwise normal;; b) ([facility]) - Normal LV size and function.  EF 60-65%.  No significant valvular abnormalities.   Upper and Lower Extremity Arterial Dopplers  2012   normal arterial flow bilateral upper extremities including subclavian is a carotid;;R. ABI 1.2, L. ABI 1.28. Normal flow velocities. Likely calcified vessels.   UPPER GASTROINTESTINAL ENDOSCOPY  2013   VIDEO BRONCHOSCOPY Bilateral 09/30/2018   Procedure: VIDEO BRONCHOSCOPY WITH FLUORO;  Surgeon: Garner Nash, DO;  [facility];  Service: Cardiopulmonary;  Laterality: Bilateral;    Current Outpatient Medications  Medication Sig Dispense Refill   acetaminophen-codeine (TYLENOL #3) 300-30 MG tablet Take 1 tablet by mouth daily as needed for pain.     albuterol (PROVENTIL) (2.5 MG/3ML) 0.083% nebulizer solution Take 3 mLs (2.5 mg total) by nebulization every 6 (six) hours as needed for wheezing or shortness of breath. 75 mL 5   amiodarone (PACERONE) 100 MG tablet Take 1 tablet (100 mg total) by mouth daily. 90 tablet 0   aspirin EC 81 MG EC tablet Take 1 tablet (81 mg total) by mouth daily. 30 tablet 2   atorvastatin (LIPITOR) 10 MG tablet Take 10 mg by mouth daily.     budesonide (PULMICORT) 0.5 MG/2ML nebulizer solution Take 2 mLs (0.5 mg total) by nebulization 2 (two) times daily. 60 mL 5   budesonide-formoterol (SYMBICORT) 160-4.5 MCG/ACT inhaler Inhale 2 puffs into the lungs every 12 (twelve) hours. 2 Inhaler 0    fluticasone (FLONASE) 50 MCG/ACT nasal spray Place 2 sprays into both nostrils daily.     formoterol (PERFOROMIST) 20 MCG/2ML nebulizer solution Take 2 mLs (20 mcg total) by nebulization 2 (two) times daily. 60 mL 5   nebivolol (BYSTOLIC) 5 MG tablet Take 5 mg by mouth daily.     nitroGLYCERIN (NITROSTAT) 0.4 MG SL tablet Place 1 tablet under the tongue every 5 (five) minutes x 3 doses as needed for chest pain.     omeprazole (PRILOSEC) 40 MG capsule TAKE ONE CAPSULE BY MOUTH DAILY 30 capsule 1   Spacer/Aero-Holding Chambers (AEROCHAMBER MV) inhaler Use as instructed 1 each 0   No current facility-administered medications for this visit.    Allergies:   Prednisone, Shellfish-derived products, Shrimp [shellfish allergy], Adhesive [tape], Simvastatin, Tamsulosin, Simvastatin - high dose  [simvastatin-high dose], Fish allergy, Fish-derived products, and Isovue [iopamidol]   Social History:  The patient  reports that he has never smoked. He has never used smokeless tobacco. He reports that he does not drink alcohol and does not use drugs.   Family History:  The patient's family history includes Coronary artery disease in his father; Heart attack (age of onset: 66) in his brother; Heart attack (age of onset: 55) in his paternal grandfather; Heart attack (age of onset: 70) in his father; Ovarian cancer in his mother.  ROS:  Please see the history of present illness.    All other systems are reviewed and otherwise negative.   PHYSICAL EXAM:  VS:  There were no vitals taken for this visit. BMI: There is no height or weight on file to calculate BMI. Well nourished, well developed, in no acute distress HEENT: normocephalic, atraumatic Neck: no JVD, carotid bruits or masses Cardiac:  *** RRR; no significant murmurs, no rubs, or gallops Lungs:  *** CTA b/l, no wheezing, rhonchi or rales Abd: soft, nontender MS: no deformity or *** atrophy Ext: *** no edema Skin: warm and dry, no rash Neuro:  No  gross deficits appreciated Psych: euthymic mood, full affect    EKG:  not done today   LHC 06/2020 showing normal right dominant coronary artery system with minor luminal irregularity in the LAD. Normal LVEF, LVEDP  01/11/2020: limited echo Limited echo for acute CVA compared with images from December 13, 2019.   2 injections of agitated saline were given with fair opacification. No  obvious right to left shunt was detected.   Left ventricular ejection fraction remains normal.   The right ventricle when compared with prior study appears larger and  shares the apex. Clinical correlation for interval development of  pulmonary embolism is advised.   If there is a significant concern for cardioembolic stroke then consider  transesophageal echocardiography.    12/13/2019: TTE  LV size is normal   LVEF 60 - 65%. LV systolic function is normal.   No significant valvular abnormality.   No pulmonary hypertension.   No prior echo study to compare with.   09/03/2018: TTE Study Conclusions  - Left ventricle: The cavity size was normal. Systolic function was    normal. The estimated ejection fraction was in the range of 55%    to 60%. There is hypokinesis of the apicalanteroseptal    myocardium. Doppler parameters are consistent with abnormal left    ventricular relaxation (grade 1 diastolic dysfunction).  - Mitral valve: There was trivial regurgitation.  - Tricuspid valve: There was mild regurgitation.  - Pulmonic valve: There was trivial regurgitation.   Recent Labs: No results found for requested labs within last 8760 hours.  No results found for requested labs within last 8760 hours.   CrCl cannot be calculated (Patient's most recent lab result is older than the maximum 21 days allowed.).   Wt Readings from Last 3 Encounters:  01/18/22 178 lb (80.7 kg)  12/03/21 176 lb (79.8 kg)  12/17/18 180 lb 3.2 oz (81.7 kg)     Other studies reviewed: Additional studies/records  reviewed today include: summarized above  ASSESSMENT AND PLAN:  Syncope ***  SVT ***  PVCs ***   Disposition: F/u with ***  Current medicines are reviewed at length with the patient today.  The patient did not have any concerns regarding medicines.  Venetia Night, PA-C 05/14/2022 7:20 AM     [place] [address] [place] [address] [phone] (office)  [phone] (fax)

## 2022-05-14 NOTE — Telephone Encounter (Signed)
Attempted phone call to pt.  No answer and Voicemail has not been set up. ?

## 2022-05-15 ENCOUNTER — Encounter: Payer: Medicare Other | Admitting: Physician Assistant

## 2022-05-28 NOTE — Progress Notes (Unsigned)
Cardiology Office Note Date:  05/28/2022  Patient ID:  Nathaniel [name], Nathaniel [name] May 19, 1961, MRN [id number] PCP:  Maryan Char, NP  Cardiologist:  Dr. Ellyn Hack Electrophysiologist: Dr. Curt Bears  ***refresh   Chief Complaint: *** planned f/u ?  History of Present Illness: Nathaniel [name] is a 61 y.o. male with history of barrett's esophagus, HLD, SVT, TIA, stroke, syncope  Deaf: uses sign interpretor   LHC 06/2020 showing normal right dominant coronary artery system with minor luminal irregularity in the LAD. Normal LVEF, LVEDP   Nov 2022 ER visit for crushing CP, associated palpitations. Also reported epistaxis (resolved), SOB, vomiting. No fever, cough. Reports chronic right sided numbness/weakness/cold sensation, unchanged since February. Of note, pt is currently in police custody, officer at bedside.  In the ED, CTA C/A/P showed no acute aortic process. Troponin <0.012 x3. EKG NSR without ischemic change. CT head no acute findings. Placing in observation.  11/5: chest pain has completely resolved and he is eager to return home Workup including CTA chest was negative but showed a moderate hiatal hernia so have increased PPI to bid.  He saw A. Chalmers Cater, PA-C Dec 2022, moved from [place], unclear hx of syncope, worried perhaps being "tazed" by the police may have cause loop to reach EOS Noted mentions that he had syncope during loop monitoring which showed NSR. Cause of syncope does not appear to have been identified.   He comes in today to be seen for Dr. Curt Bears, last seen by him Jan 2023, discussed hx of SVT historically prn Flecainide, though with increased dosing not effective and stopped.  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  Note mentions known PVCs as well. Discusses that he was no longer on AAD.   *** hospital visit [facility]  *** ??? Emal sent to [facility]? *** symptoms *** no CAD, normal LV *** amiodarone????  Who started, when?   Past Medical  History:  Diagnosis Date   Allergy    Arthritis    right knee   Asthma    prn inhaler   Barrett's esophagus 10-25-14 pt denies   states "minor"   Chest pain    a. 06/2016 MV: EF 52%, no ischemia or infarct.   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    GERD (gastroesophageal reflux disease)    Heartburn    occasional, related to certain foods   Hemoptysis 09/20/2018   High cholesterol    Intermittent palpitations 10-25-14 patient denies   a. Event Monitor 04/2014: Mostly NSR - intermittent PACs, - 2 brief runs of PAT/PSVT   PSVT (paroxysmal supraventricular tachycardia) ([place])    a. 04/2014 Event Monitor: brief PAT/PSVT-->uses prn flecainide.   Seasonal allergies    Stroke [facility]) 2013   TIA   TIA (transient ischemic attack) 10-25-14 pt denies   no current deficits   Vasovagal syncope     Past Surgical History:  Procedure Laterality Date   63 DAY CARDIAC EVENT MONITOR  08/2015   Ranging from mostly sinus rhythm to occasional sinus bradycardia and sinus tachycardia. Heart rate from 53 bpm to 129 bpm. No documented PVCs or PACs noted. No arrhythmia noted. No PSVT or A. Fib. "Heart racing" noted with sinus tachycardia.   Abdominal Aortic Ultrasound  2012   normal abdominal aorta   BUNIONECTOMY Left 2012   right done also   CARDIAC CATHETERIZATION  06/24/2011   wwidely patent normal coronary  arteries with normal ejection fraction   Carotid Artery Dopplers  2012   tortuous but no stenoses.. No subclavian disease   COLONOSCOPY  2015   COLONOSCOPY WITH PROPOFOL N/A 11/03/2014   Procedure: COLONOSCOPY WITH PROPOFOL;  Surgeon: Milus Banister, MD;  Location: [facility];  Service: Endoscopy;  Laterality: N/A;   Foot Implant Removal Right 07/15/2013   @ [place]   INGUINAL HERNIA REPAIR     KNEE ARTHROSCOPY Left 06/02/2012   KNEE ARTHROSCOPY Right fall 2013   x 2   KNEE ARTHROSCOPY Left 03/03/2013   Procedure: ARTHROSCOPY KNEE  WITH CHONDROPLASTY PATELLA  AND LATERAL TIBIAL PLATEAU;  Surgeon: Kerin Salen, MD;  Location: [facility];  Service: Orthopedics;  Laterality: Left;   LOOP RECORDER INSERTION N/A 02/26/2018   Procedure: LOOP RECORDER INSERTION;  Surgeon: Constance Haw, MD;  Location: [facility];  Service: Cardiovascular;  Laterality: N/A;   NM MYOVIEW LTD  06/2016   No evidence of ischemia or infarction.    TRANSTHORACIC ECHOCARDIOGRAM  June 2015, August 2018   A) normal EF: 55-60%. No wall motion abnormalities. Gr 1 DD.  Mild MR. Otherwise normal;; b) ([facility]) - Normal LV size and function.  EF 60-65%.  No significant valvular abnormalities.   Upper and Lower Extremity Arterial Dopplers  2012   normal arterial flow bilateral upper extremities including subclavian is a carotid;;R. ABI 1.2, L. ABI 1.28. Normal flow velocities. Likely calcified vessels.   UPPER GASTROINTESTINAL ENDOSCOPY  2013   VIDEO BRONCHOSCOPY Bilateral 09/30/2018   Procedure: VIDEO BRONCHOSCOPY WITH FLUORO;  Surgeon: Garner Nash, DO;  Location: [facility];  Service: Cardiopulmonary;  Laterality: Bilateral;    Current Outpatient Medications  Medication Sig Dispense Refill   acetaminophen-codeine (TYLENOL #3) 300-30 MG tablet Take 1 tablet by mouth daily as needed for pain.     albuterol (PROVENTIL) (2.5 MG/3ML) 0.083% nebulizer solution Take 3 mLs (2.5 mg total) by nebulization every 6 (six) hours as needed for wheezing or shortness of breath. 75 mL 5   amiodarone (PACERONE) 100 MG tablet Take 1 tablet (100 mg total) by mouth daily. 90 tablet 0   aspirin EC 81 MG EC tablet Take 1 tablet (81 mg total) by mouth daily. 30 tablet 2   atorvastatin (LIPITOR) 10 MG tablet Take 10 mg by mouth daily.     budesonide (PULMICORT) 0.5 MG/2ML nebulizer solution Take 2 mLs (0.5 mg total) by nebulization 2 (two) times daily. 60 mL 5   budesonide-formoterol (SYMBICORT) 160-4.5 MCG/ACT inhaler Inhale 2 puffs into the lungs  every 12 (twelve) hours. 2 Inhaler 0   fluticasone (FLONASE) 50 MCG/ACT nasal spray Place 2 sprays into both nostrils daily.     formoterol (PERFOROMIST) 20 MCG/2ML nebulizer solution Take 2 mLs (20 mcg total) by nebulization 2 (two) times daily. 60 mL 5   nebivolol (BYSTOLIC) 5 MG tablet Take 5 mg by mouth daily.     nitroGLYCERIN (NITROSTAT) 0.4 MG SL tablet Place 1 tablet under the tongue every 5 (five) minutes x 3 doses as needed for chest pain.     omeprazole (PRILOSEC) 40 MG capsule TAKE ONE CAPSULE BY MOUTH DAILY 30 capsule 1   Spacer/Aero-Holding Chambers (AEROCHAMBER MV) inhaler Use as instructed 1 each 0   No current facility-administered medications for this visit.    Allergies:   Prednisone, Shellfish-derived products, Shrimp [shellfish allergy], Adhesive [tape], Simvastatin, Tamsulosin, Simvastatin - high dose  [simvastatin-high dose], Fish allergy, Fish-derived products, and Isovue [iopamidol]  Social History:  The patient  reports that he has never smoked. He has never used smokeless tobacco. He reports that he does not drink alcohol and does not use drugs.   Family History:  The patient's family history includes Coronary artery disease in his father; Heart attack (age of onset: 17) in his brother; Heart attack (age of onset: 70) in his paternal grandfather; Heart attack (age of onset: 5) in his father; Ovarian cancer in his mother.  ROS:  Please see the history of present illness.    All other systems are reviewed and otherwise negative.   PHYSICAL EXAM:  VS:  There were no vitals taken for this visit. BMI: There is no height or weight on file to calculate BMI. Well nourished, well developed, in no acute distress HEENT: normocephalic, atraumatic Neck: no JVD, carotid bruits or masses Cardiac:  *** RRR; no significant murmurs, no rubs, or gallops Lungs:  *** CTA b/l, no wheezing, rhonchi or rales Abd: soft, nontender MS: no deformity or *** atrophy Ext: *** no  edema Skin: warm and dry, no rash Neuro:  No gross deficits appreciated Psych: euthymic mood, full affect    EKG:  not done today   LHC 06/2020 showing normal right dominant coronary artery system with minor luminal irregularity in the LAD. Normal LVEF, LVEDP  01/11/2020: limited echo Limited echo for acute CVA compared with images from December 13, 2019.   2 injections of agitated saline were given with fair opacification. No  obvious right to left shunt was detected.   Left ventricular ejection fraction remains normal.   The right ventricle when compared with prior study appears larger and  shares the apex. Clinical correlation for interval development of  pulmonary embolism is advised.   If there is a significant concern for cardioembolic stroke then consider  transesophageal echocardiography.    12/13/2019: TTE  LV size is normal   LVEF 60 - 65%. LV systolic function is normal.   No significant valvular abnormality.   No pulmonary hypertension.   No prior echo study to compare with.   09/03/2018: TTE Study Conclusions  - Left ventricle: The cavity size was normal. Systolic function was    normal. The estimated ejection fraction was in the range of 55%    to 60%. There is hypokinesis of the apicalanteroseptal    myocardium. Doppler parameters are consistent with abnormal left    ventricular relaxation (grade 1 diastolic dysfunction).  - Mitral valve: There was trivial regurgitation.  - Tricuspid valve: There was mild regurgitation.  - Pulmonic valve: There was trivial regurgitation.   Recent Labs: No results found for requested labs within last 8760 hours.  No results found for requested labs within last 8760 hours.   CrCl cannot be calculated (Patient's most recent lab result is older than the maximum 21 days allowed.).   Wt Readings from Last 3 Encounters:  01/18/22 178 lb (80.7 kg)  12/03/21 176 lb (79.8 kg)  12/17/18 180 lb 3.2 oz (81.7 kg)     Other  studies reviewed: Additional studies/records reviewed today include: summarized above  ASSESSMENT AND PLAN:  Syncope ***  SVT ***  PVCs ***   Disposition: F/u with ***  Current medicines are reviewed at length with the patient today.  The patient did not have any concerns regarding medicines.  Venetia Night, PA-C 05/28/2022 1:46 PM     [address] [place] [address] [phone] (office)  [phone] (  fax)

## 2022-05-29 ENCOUNTER — Ambulatory Visit (INDEPENDENT_AMBULATORY_CARE_PROVIDER_SITE_OTHER): Payer: Medicare Other | Admitting: Physician Assistant

## 2022-05-29 ENCOUNTER — Encounter: Payer: Self-pay | Admitting: Physician Assistant

## 2022-05-29 VITALS — BP 118/88 | HR 96 | Ht 71.0 in | Wt 177.8 lb

## 2022-05-29 DIAGNOSIS — I471 Supraventricular tachycardia: Secondary | ICD-10-CM | POA: Diagnosis not present

## 2022-05-29 DIAGNOSIS — G459 Transient cerebral ischemic attack, unspecified: Secondary | ICD-10-CM | POA: Diagnosis not present

## 2022-05-29 DIAGNOSIS — R55 Syncope and collapse: Secondary | ICD-10-CM | POA: Diagnosis not present

## 2022-05-29 DIAGNOSIS — R079 Chest pain, unspecified: Secondary | ICD-10-CM

## 2022-05-29 NOTE — Patient Instructions (Signed)
Medication Instructions:   Your physician recommends that you continue on your current medications as directed. Please refer to the Current Medication list given to you today.  *If you need a refill on your cardiac medications before your next appointment, please call your pharmacy*   Lab Work: [place]   If you have labs (blood work) drawn today and your tests are completely normal, you will receive your results only by: [place] (if you have MyChart) OR A paper copy in the mail If you have any lab test that is abnormal or we need to change your treatment, we will call you to review the results.   Testing/Procedures: NONE ORDERED  TODAY    Follow-Up: At [facility], you and your health needs are our priority.  As part of our continuing mission to provide you with exceptional heart care, we have created designated Provider Care Teams.  These Care Teams include your primary Cardiologist (physician) and Advanced Practice Providers (APPs -  Physician Assistants and Nurse Practitioners) who all work together to provide you with the care you need, when you need it.  We recommend signing up for the patient portal called "MyChart".  Sign up information is provided on this After Visit Summary.  MyChart is used to connect with patients for Virtual Visits (Telemedicine).  Patients are able to view lab/test results, encounter notes, upcoming appointments, etc.  Non-urgent messages can be sent to your provider as well.   To learn more about what you can do with MyChart, go to [email or web address].    Your next appointment:  AS SOON  AS RECEIVED RECORDS   The format for your next appointment:   In Person  Provider:   You may see Will Meredith Leeds, MD or one of the following Advanced Practice Providers on your designated Care Team:   Tommye Standard, [place] Legrand Como "Jonni Sanger" Chalmers Cater, [place]    Other Instructions  Important Information About [place]

## 2022-05-31 ENCOUNTER — Ambulatory Visit (INDEPENDENT_AMBULATORY_CARE_PROVIDER_SITE_OTHER): Payer: Medicare Other

## 2022-05-31 ENCOUNTER — Other Ambulatory Visit: Payer: Self-pay | Admitting: *Deleted

## 2022-05-31 ENCOUNTER — Other Ambulatory Visit: Payer: Self-pay | Admitting: Physician Assistant

## 2022-05-31 DIAGNOSIS — R079 Chest pain, unspecified: Secondary | ICD-10-CM

## 2022-05-31 DIAGNOSIS — R002 Palpitations: Secondary | ICD-10-CM

## 2022-05-31 NOTE — Progress Notes (Unsigned)
Enrolled patient for a 14 day Zio XT monitor to be mailed to patients home  Dr Curt Bears to read

## 2022-06-03 DIAGNOSIS — R079 Chest pain, unspecified: Secondary | ICD-10-CM | POA: Diagnosis not present

## 2022-06-03 DIAGNOSIS — R002 Palpitations: Secondary | ICD-10-CM | POA: Diagnosis not present

## 2022-06-20 ENCOUNTER — Encounter (HOSPITAL_COMMUNITY): Payer: Self-pay

## 2022-06-20 ENCOUNTER — Telehealth: Payer: Self-pay | Admitting: Physician Assistant

## 2022-06-20 ENCOUNTER — Telehealth (HOSPITAL_COMMUNITY): Payer: Self-pay | Admitting: Emergency Medicine

## 2022-06-20 DIAGNOSIS — R079 Chest pain, unspecified: Secondary | ICD-10-CM

## 2022-06-20 DIAGNOSIS — Z91041 Radiographic dye allergy status: Secondary | ICD-10-CM

## 2022-06-20 MED ORDER — METOPROLOL TARTRATE 100 MG PO TABS: 100.0000 mg | ORAL_TABLET | Freq: Once | ORAL | 0 refills | Status: DC

## 2022-06-20 MED ORDER — DIPHENHYDRAMINE HCL 50 MG PO TABS: ORAL_TABLET | ORAL | 0 refills | Status: DC

## 2022-06-20 MED ORDER — METHYLPREDNISOLONE 32 MG PO TABS: 32.0000 mg | ORAL_TABLET | ORAL | 0 refills | Status: DC

## 2022-06-20 NOTE — Telephone Encounter (Signed)
  Pt c/o of Chest Pain: STAT if CP now or developed within 24 hours  1. Are you having CP right now? Light pain  2. Are you experiencing any other symptoms (ex. SOB, nausea, vomiting, sweating)? None  3. How long have you been experiencing CP? 3 am   4. Is your CP continuous or coming and going? coming and going  5. Have you taken Nitroglycerin? Yes, 1 pill     ?

## 2022-06-20 NOTE — Telephone Encounter (Signed)
Spoke with the patient through a sign language interpretor. Patient reports that around 3am this morning he woke up with chest pain. He described it as sharp and tight across his chest and radiating into his arm. No other symptoms. He took one nitroglycerin and went back to sleep. He woke up and felt better but still a bit off. He did report that he vomited last night around 5:30pm. He states that he has had similar feelings of chest pain in the past but this was worse. Patient is currently in [place]. I advised him to go to the ER for evaluation. Patient refused to go to the ER stating that they do not treat him well there and do not know how to communicate with him. He is currently scheduled for a coronary CTA on 6/28 which he states that he is going to have to push back because he can't get to [place] until the following week. Encouraged him to try and keep this appointment and get back in time for it. Advised patient that there was not much we could do for him since he is not in town and his best option would be the ER at this time.

## 2022-06-20 NOTE — Telephone Encounter (Signed)
ASL left callback message : This is Nathaniel [name] from [facility] regarding an upcoming appt on wed June 28 at 11:30a, wanted to review the CCTA instructions so that you are coming in prepared. please return my call at [phone]  Will send message to mychart account Also will prescribe meds for HR control and contrast allergy   [name]

## 2022-06-26 ENCOUNTER — Other Ambulatory Visit (HOSPITAL_COMMUNITY): Payer: Medicare Other

## 2022-08-06 ENCOUNTER — Telehealth: Payer: Self-pay | Admitting: Cardiology

## 2022-08-06 NOTE — Telephone Encounter (Signed)
  Per MyChart scheduling message:  STAT if HR is under 50 or over 120 (normal HR is 60-100 beats per minute)  What is your heart rate?   Do you have a log of your heart rate readings (document readings)?   Do you have any other symptoms?   My heart still feel down/up as CHF or SVT valves missing, so I need to come to see you at your office on this Friday at 3:30 also I need a paper of approval from Dr.Camnitz's caring of me because [facility] want know, so I can come to pick up an approval paper at your office on this Friday afternoon. [name]   My heartbeat rates are 63 to 82. My heart valves are misses in more than before, so I feel my heart valves sounds are loud and louder that valves are misses. 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 Nathaniel [name]

## 2022-08-06 NOTE — Telephone Encounter (Signed)
Attempted to call patient. Unable to leave voicemail.  

## 2022-08-13 ENCOUNTER — Encounter: Payer: Self-pay | Admitting: Cardiology

## 2022-08-13 NOTE — Telephone Encounter (Signed)
error 

## 2022-08-13 NOTE — Telephone Encounter (Signed)
Attempted to contact pt, voicemail not set up. Will reach out to pt via mychart

## 2022-08-23 NOTE — Progress Notes (Deleted)
PCP:  Maryan Char, NP Primary Cardiologist: Glenetta Hew, MD Electrophysiologist: Will Meredith Leeds, MD   Nathaniel [name] is a 61 y.o. male seen today for Will Meredith Leeds, MD for routine electrophysiology followup.  Since last being seen in our clinic the patient reports doing ***.  he denies chest pain, palpitations, dyspnea, PND, orthopnea, nausea, vomiting, dizziness, syncope, edema, weight gain, or early satiety.  Past Medical History:  Diagnosis Date   Allergy    Arthritis    right knee   Asthma    prn inhaler   Barrett's esophagus 10-25-14 pt denies   states "minor"   Chest pain    a. 06/2016 MV: EF 52%, no ischemia or infarct.   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    GERD (gastroesophageal reflux disease)    Heartburn    occasional, related to certain foods   Hemoptysis 09/20/2018   High cholesterol    Intermittent palpitations 10-25-14 patient denies   a. Event Monitor 04/2014: Mostly NSR - intermittent PACs, - 2 brief runs of PAT/PSVT   PSVT (paroxysmal supraventricular tachycardia) ([place])    a. 04/2014 Event Monitor: brief PAT/PSVT-->uses prn flecainide.   Seasonal allergies    Stroke [facility]) 2013   TIA   TIA (transient ischemic attack) 10-25-14 pt denies   no current deficits   Vasovagal syncope    Past Surgical History:  Procedure Laterality Date   73 DAY CARDIAC EVENT MONITOR  08/2015   Ranging from mostly sinus rhythm to occasional sinus bradycardia and sinus tachycardia. Heart rate from 53 bpm to 129 bpm. No documented PVCs or PACs noted. No arrhythmia noted. No PSVT or A. Fib. "Heart racing" noted with sinus tachycardia.   Abdominal Aortic Ultrasound  2012   normal abdominal aorta   BUNIONECTOMY Left 2012   right done also   CARDIAC CATHETERIZATION  06/24/2011   wwidely patent normal coronary arteries with normal ejection fraction   Carotid Artery Dopplers  2012   tortuous but  no stenoses.. No subclavian disease   COLONOSCOPY  2015   COLONOSCOPY WITH PROPOFOL N/A 11/03/2014   Procedure: COLONOSCOPY WITH PROPOFOL;  Surgeon: Milus Banister, MD;  Location: [facility];  Service: Endoscopy;  Laterality: N/A;   Foot Implant Removal Right 07/15/2013   @ [place]   INGUINAL HERNIA REPAIR     KNEE ARTHROSCOPY Left 06/02/2012   KNEE ARTHROSCOPY Right fall 2013   x 2   KNEE ARTHROSCOPY Left 03/03/2013   Procedure: ARTHROSCOPY KNEE WITH CHONDROPLASTY PATELLA  AND LATERAL TIBIAL PLATEAU;  Surgeon: Kerin Salen, MD;  Location: [place];  Service: Orthopedics;  Laterality: Left;   LOOP RECORDER INSERTION N/A 02/26/2018   Procedure: LOOP RECORDER INSERTION;  Surgeon: Constance Haw, MD;  Location: [place] [facility];  Service: Cardiovascular;  Laterality: N/A;   NM MYOVIEW LTD  06/2016   No evidence of ischemia or infarction.    TRANSTHORACIC ECHOCARDIOGRAM  June 2015, August 2018   A) normal EF: 55-60%. No wall motion abnormalities. Gr 1 DD.  Mild MR. Otherwise normal;; b) ([facility]) - Normal LV size and function.  EF 60-65%.  No significant valvular abnormalities.   Upper and Lower Extremity Arterial Dopplers  2012   normal arterial flow bilateral upper extremities including subclavian is a carotid;;R. ABI 1.2, L. ABI 1.28. Normal flow velocities. Likely calcified vessels.   UPPER GASTROINTESTINAL ENDOSCOPY  2013  VIDEO BRONCHOSCOPY Bilateral 09/30/2018   Procedure: VIDEO BRONCHOSCOPY WITH FLUORO;  Surgeon: Garner Nash, DO;  [facility];  Service: Cardiopulmonary;  Laterality: Bilateral;    Current Outpatient Medications  Medication Sig Dispense Refill   albuterol (PROVENTIL) (2.5 MG/3ML) 0.083% nebulizer solution Take 3 mLs (2.5 mg total) by nebulization every 6 (six) hours as needed for wheezing or shortness of breath. 75 mL 5   budesonide (PULMICORT) 0.5 MG/2ML nebulizer solution Take 2 mLs (0.5 mg total) by nebulization 2 (two) times daily.  60 mL 5   budesonide-formoterol (SYMBICORT) 160-4.5 MCG/ACT inhaler Inhale 2 puffs into the lungs every 12 (twelve) hours. 2 Inhaler 0   diphenhydrAMINE (BENADRYL) 50 MG tablet Take 1 tablet ('50mg'$ ) 1 hr prior to CT appt 1 tablet 0   fluticasone (FLONASE) 50 MCG/ACT nasal spray Place 2 sprays into both nostrils daily.     formoterol (PERFOROMIST) 20 MCG/2ML nebulizer solution Take 2 mLs (20 mcg total) by nebulization 2 (two) times daily. 60 mL 5   methylPREDNISolone (MEDROL) 32 MG tablet Take 1 tablet (32 mg total) by mouth as directed. Take 1 tablet ('32mg'$  total) by mouth 12 hrs prior to contrasted CT appt, then take 1 tablet ('32mg'$  total) by mouth 2 hrs prior to contrasted CT appt. 2 tablet 0   metoprolol tartrate (LOPRESSOR) 100 MG tablet Take 1 tablet (100 mg total) by mouth once for 1 dose. Please take one time dose '100mg'$  metoprolol tartrate 2 hr prior to cardiac CT for HR control IF HR >55bpm. 1 tablet 0   nitroGLYCERIN (NITROSTAT) 0.4 MG SL tablet Place 1 tablet under the tongue every 5 (five) minutes x 3 doses as needed for chest pain.     omeprazole (PRILOSEC) 40 MG capsule TAKE ONE CAPSULE BY MOUTH DAILY 30 capsule 1   Spacer/Aero-Holding Chambers (AEROCHAMBER MV) inhaler Use as instructed 1 each 0   No current facility-administered medications for this visit.    Allergies  Allergen Reactions   Prednisone Palpitations   Shellfish-Derived Products Itching and Rash   Shrimp [Shellfish Allergy] Rash and Itching   Adhesive [Tape] Itching   Simvastatin Other (See Comments)    DEPRESSION, VISUAL DISTURBANCE(FLASHING LIGHTS)   Tamsulosin Other (See Comments)    Changes in behavior    Simvastatin - High Dose  [Simvastatin-High Dose]     Depression, behavioral issues   Fish Allergy Rash and Other (See Comments)    SOFTSHELL FISH   Fish-Derived Products Rash and Other (See Comments)    SOFTSHELL FISH    Isovue [Iopamidol] Anxiety    Pt c/o chest tightness post injection, respiratory rate  and o2 sats normal, evaluated by ED RN and respiratory and PA, given IV benadryl Pt states he does NOT want contrast in the future    Social History   Socioeconomic History   Marital status: Married    Spouse name: Not on file   Number of children: 1   Years of education: college   Highest education level: Not on file  Occupational History   Occupation: disability  Tobacco Use   Smoking status: Never   Smokeless tobacco: Never  Vaping Use   Vaping Use: Never used  Substance and Sexual Activity   Alcohol use: Never    Alcohol/week: 0.0 standard drinks of alcohol    Comment: occ   Drug use: No   Sexual activity: Not on file  Other Topics Concern   Not on file  Social History Narrative   He is  deaf, requiring an interpreter.   He started a new job back in 2013, which was much less stressful for him.  He has subsequently gone on to disability.   He is a married father of one. He previously wrote his bike 2 times a week.   Does not smoke or drink.         Social Determinants of Health   Financial Resource Strain: Not on file  Food Insecurity: Not on file  Transportation Needs: Not on file  Physical Activity: Not on file  Stress: Not on file  Social Connections: Not on file  Intimate Partner Violence: Not on file     Review of Systems: All other systems reviewed and are otherwise negative except as noted above.  Physical Exam: There were no vitals filed for this visit.  GEN- The patient is well appearing, alert and oriented x 3 today.   HEENT: normocephalic, atraumatic; sclera clear, conjunctiva pink; hearing intact; oropharynx clear; neck supple, no JVP Lymph- no cervical lymphadenopathy Lungs- Clear to ausculation bilaterally, normal work of breathing.  No wheezes, rales, rhonchi Heart- Regular rate and rhythm, no murmurs, rubs or gallops, PMI not laterally displaced GI- soft, non-tender, non-distended, bowel sounds present, no hepatosplenomegaly Extremities- no  clubbing, cyanosis, or edema; DP/PT/radial pulses 2+ bilaterally MS- no significant deformity or atrophy Skin- warm and dry, no rash or lesion Psych- euthymic mood, full affect Neuro- strength and sensation are intact  EKG {ACTION; IS/IS ZOX:[id number]} ordered. Personal review of EKG from {Blank single:19197::"today","***"} shows ***  Additional studies reviewed include: Previous EP office notes. ***  Records received from [facility] CXR was clear Chest CT: no PE, large hiatal hernia EKGs are SR without ischemic changes   Pharm. Stress myoview Abnormal tracer distribution on rest and stress images, suggesting infarct vs attenuation artifact.  Thre is a small fixed perfusion abnormality of mild intensity in the lateral wall .  The TID ration was 1.43 which can be a high risk feature Impression: this is an abnormal study.  Consider angiography if clinically indicated Risk assessment: high risk scan   TTE LVEF 45-50% Mild distal anteroseptal wall appears mildly hypokinetic Increased gradients PV, possible supravalvular stenosis, max gradient 48mHg, mean 9 Trace PV regurgitation No pericardial effusion   TEE LVEF 50-55%, mild MR, mild PV stenosis Lipomatous hypertrophy of  intra-atrial spectrum Bubble study negative No evidence of significant pericardial effusion WBC 5.2 H/H 13/38 Plts 211 K+ 3.6 BUN/Creat 14/0.9 Mag 1.9 HS Trop <3, <3, <3 NTBNP <15  Assessment and Plan:  1. Syncope 2.    CP Had near syncope as discussed above, associated with CP, pallor > hospital Monitor 05/2022 looked OK. 3 short, asymptomatic SVT episodes. Less than 1% ectopy.    3. SVT Previously managed on prn flecainide but stopped due to ineffectiveness.  No symptoms at this time.     Follow up with {Blank single:19197::"Dr. Allred","Dr. TArlan Organ Klein","Dr. Camnitz","Dr. Lambert","EP APP"} in {Blank single:19197::"2 weeks","4 weeks","3 months","6 months","12 months","as usual post gen  change"}   MShirley Friar [place] 08/23/22 10:47 AM

## 2022-08-27 ENCOUNTER — Telehealth: Payer: Self-pay | Admitting: Cardiology

## 2022-08-27 NOTE — Telephone Encounter (Signed)
   Pt called and r/s appt due to weather. She said that his heartbeats still skipping and also he would like to know if he needs to bring his medication at his appt on 09/12. He said, to call him back or send him a Pharmacist, community message

## 2022-08-27 NOTE — Telephone Encounter (Signed)
Baldwin Jamaica, PA-C  06/25/2022  6:13 PM EDT     Monitor looked ok.  No findings to explain his symptoms of CP, near fainting/fainting.  Symptom episodes were associated with normal rhythm.      I will send MyChart message to patient to bring his medications with him to appointment.   Patient cancelled apt in 2 days (8/31- due to weather)

## 2022-08-29 ENCOUNTER — Ambulatory Visit: Payer: Medicare Other | Admitting: Student

## 2022-08-29 DIAGNOSIS — R55 Syncope and collapse: Secondary | ICD-10-CM

## 2022-08-29 DIAGNOSIS — R079 Chest pain, unspecified: Secondary | ICD-10-CM

## 2022-09-09 NOTE — Progress Notes (Unsigned)
Electrophysiology Office Note Date: 09/09/2022  ID:  Nathaniel [name], DOB 03-26-1961, MRN [id number]  PCP: Maryan Char, NP Primary Cardiologist: Glenetta Hew, MD Electrophysiologist: Constance Haw, MD   CC: ILR follow-up  Nathaniel [name] is a 61 y.o. male seen today for Dr. Curt Bears . he presents today for routine electrophysiology followup.  last being seen in our clinic the patient reports doing ***.  he denies chest pain, palpitations, dyspnea, PND, orthopnea, nausea, vomiting, dizziness, syncope, edema, weight gain, or early satiety. .  Device History: Medtronic loop recorder implanted 01/2018 for syncope  Past Medical History:  Diagnosis Date   Allergy    Arthritis    right knee   Asthma    prn inhaler   Barrett's esophagus 10-25-14 pt denies   states "minor"   Chest pain    a. 06/2016 MV: EF 52%, no ischemia or infarct.   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    GERD (gastroesophageal reflux disease)    Heartburn    occasional, related to certain foods   Hemoptysis 09/20/2018   High cholesterol    Intermittent palpitations 10-25-14 patient denies   a. Event Monitor 04/2014: Mostly NSR - intermittent PACs, - 2 brief runs of PAT/PSVT   PSVT (paroxysmal supraventricular tachycardia) ([place])    a. 04/2014 Event Monitor: brief PAT/PSVT-->uses prn flecainide.   Seasonal allergies    Stroke [facility]) 2013   TIA   TIA (transient ischemic attack) 10-25-14 pt denies   no current deficits   Vasovagal syncope    Past Surgical History:  Procedure Laterality Date   32 DAY CARDIAC EVENT MONITOR  08/2015   Ranging from mostly sinus rhythm to occasional sinus bradycardia and sinus tachycardia. Heart rate from 53 bpm to 129 bpm. No documented PVCs or PACs noted. No arrhythmia noted. No PSVT or A. Fib. "Heart racing" noted with sinus tachycardia.   Abdominal Aortic Ultrasound  2012   normal abdominal aorta    BUNIONECTOMY Left 2012   right done also   CARDIAC CATHETERIZATION  06/24/2011   wwidely patent normal coronary arteries with normal ejection fraction   Carotid Artery Dopplers  2012   tortuous but no stenoses.. No subclavian disease   COLONOSCOPY  2015   COLONOSCOPY WITH PROPOFOL N/A 11/03/2014   Procedure: COLONOSCOPY WITH PROPOFOL;  Surgeon: Milus Banister, MD;  [facility];  Service: Endoscopy;  Laterality: N/A;   Foot Implant Removal Right 07/15/2013   @ [place]   INGUINAL HERNIA REPAIR     KNEE ARTHROSCOPY Left 06/02/2012   KNEE ARTHROSCOPY Right fall 2013   x 2   KNEE ARTHROSCOPY Left 03/03/2013   Procedure: ARTHROSCOPY KNEE WITH CHONDROPLASTY PATELLA  AND LATERAL TIBIAL PLATEAU;  Surgeon: Kerin Salen, MD;  Location: [place];  Service: Orthopedics;  Laterality: Left;   LOOP RECORDER INSERTION N/A 02/26/2018   Procedure: LOOP RECORDER INSERTION;  Surgeon: Constance Haw, MD;  Location: [place] [facility];  Service: Cardiovascular;  Laterality: N/A;   NM MYOVIEW LTD  06/2016   No evidence of ischemia or infarction.    TRANSTHORACIC ECHOCARDIOGRAM  June 2015, August 2018   A) normal EF: 55-60%. No wall motion abnormalities. Gr 1 DD.  Mild MR. Otherwise normal;; b) ([facility]) - Normal LV size and function.  EF 60-65%.  No significant valvular abnormalities.   Upper and Lower Extremity Arterial Dopplers  2012  normal arterial flow bilateral upper extremities including subclavian is a carotid;;R. ABI 1.2, L. ABI 1.28. Normal flow velocities. Likely calcified vessels.   UPPER GASTROINTESTINAL ENDOSCOPY  2013   VIDEO BRONCHOSCOPY Bilateral 09/30/2018   Procedure: VIDEO BRONCHOSCOPY WITH FLUORO;  Surgeon: Garner Nash, DO;  [facility];  Service: Cardiopulmonary;  Laterality: Bilateral;    Current Outpatient Medications  Medication Sig Dispense Refill   albuterol (PROVENTIL) (2.5 MG/3ML) 0.083% nebulizer solution Take 3 mLs (2.5 mg total) by  nebulization every 6 (six) hours as needed for wheezing or shortness of breath. 75 mL 5   budesonide (PULMICORT) 0.5 MG/2ML nebulizer solution Take 2 mLs (0.5 mg total) by nebulization 2 (two) times daily. 60 mL 5   budesonide-formoterol (SYMBICORT) 160-4.5 MCG/ACT inhaler Inhale 2 puffs into the lungs every 12 (twelve) hours. 2 Inhaler 0   diphenhydrAMINE (BENADRYL) 50 MG tablet Take 1 tablet ('50mg'$ ) 1 hr prior to CT appt 1 tablet 0   fluticasone (FLONASE) 50 MCG/ACT nasal spray Place 2 sprays into both nostrils daily.     formoterol (PERFOROMIST) 20 MCG/2ML nebulizer solution Take 2 mLs (20 mcg total) by nebulization 2 (two) times daily. 60 mL 5   methylPREDNISolone (MEDROL) 32 MG tablet Take 1 tablet (32 mg total) by mouth as directed. Take 1 tablet ('32mg'$  total) by mouth 12 hrs prior to contrasted CT appt, then take 1 tablet ('32mg'$  total) by mouth 2 hrs prior to contrasted CT appt. 2 tablet 0   metoprolol tartrate (LOPRESSOR) 100 MG tablet Take 1 tablet (100 mg total) by mouth once for 1 dose. Please take one time dose '100mg'$  metoprolol tartrate 2 hr prior to cardiac CT for HR control IF HR >55bpm. 1 tablet 0   nitroGLYCERIN (NITROSTAT) 0.4 MG SL tablet Place 1 tablet under the tongue every 5 (five) minutes x 3 doses as needed for chest pain.     omeprazole (PRILOSEC) 40 MG capsule TAKE ONE CAPSULE BY MOUTH DAILY 30 capsule 1   Spacer/Aero-Holding Chambers (AEROCHAMBER MV) inhaler Use as instructed 1 each 0   No current facility-administered medications for this visit.    Allergies:   Prednisone, Shellfish-derived products, Shrimp [shellfish allergy], Adhesive [tape], Simvastatin, Tamsulosin, Simvastatin - high dose  [simvastatin-high dose], Fish allergy, Fish-derived products, and Isovue [iopamidol]   Social History: Social History   Socioeconomic History   Marital status: Married    Spouse name: Not on file   Number of children: 1   Years of education: college   Highest education level:  Not on file  Occupational History   Occupation: disability  Tobacco Use   Smoking status: Never   Smokeless tobacco: Never  Vaping Use   Vaping Use: Never used  Substance and Sexual Activity   Alcohol use: Never    Alcohol/week: 0.0 standard drinks of alcohol    Comment: occ   Drug use: No   Sexual activity: Not on file  Other Topics Concern   Not on file  Social History Narrative   He is deaf, requiring an interpreter.   He started a new job back in 2013, which was much less stressful for him.  He has subsequently gone on to disability.   He is a married father of one. He previously wrote his bike 2 times a week.   Does not smoke or drink.         Social Determinants of Health   Financial Resource Strain: Not on file  Food Insecurity: Not on file  Transportation Needs: Not on file  Physical Activity: Not on file  Stress: Not on file  Social Connections: Not on file  Intimate Partner Violence: Not on file    Family History: Family History  Problem Relation Age of Onset   Ovarian cancer Mother    Coronary artery disease Father    Heart attack Father 37   Heart attack Brother 68   Heart attack Paternal Grandfather 71   Colon cancer Neg Hx    Stomach cancer Neg Hx    Esophageal cancer Neg Hx    Rectal cancer Neg Hx      Review of Systems: All other systems reviewed and are otherwise negative except as noted above.  Physical Exam: There were no vitals filed for this visit.   GEN- The patient is well appearing, alert and oriented x 3 today.   HEENT: normocephalic, atraumatic; sclera clear, conjunctiva pink; hearing intact; oropharynx clear; neck supple  Lungs- Clear to ausculation bilaterally, normal work of breathing.  No wheezes, rales, rhonchi Heart- Regular rate and rhythm, no murmurs, rubs or gallops  GI- soft, non-tender, non-distended, bowel sounds present  Extremities- no clubbing, cyanosis, or edema  MS- no significant deformity or atrophy Skin- warm  and dry, no rash or lesion; ILR pocket well healed Psych- euthymic mood, full affect Neuro- strength and sensation are intact  PPM Interrogation- reviewed in detail today,  See PACEART report  EKG:  EKG is not ordered today. The ekg ordered today shows ***  Recent Labs: No results found for requested labs within last 365 days.   Wt Readings from Last 3 Encounters:  05/29/22 177 lb 12.8 oz (80.6 kg)  01/18/22 178 lb (80.7 kg)  12/03/21 176 lb (79.8 kg)     Other studies Reviewed: Additional studies/ records that were reviewed today include: Previous EP office notes, Previous remote checks, Most recent labwork.   Records received from [facility] CXR was clear Chest CT: no PE, large hiatal hernia EKGs are SR without ischemic changes   Pharm. Stress myoview Abnormal tracer distribution on rest and stress images, suggesting infarct vs attenuation artifact.  Thre is a small fixed perfusion abnormality of mild intensity in the lateral wall .  The TID ration was 1.43 which can be a high risk feature Impression: this is an abnormal study.  Consider angiography if clinically indicated Risk assessment: high risk scan   TTE LVEF 45-50% Mild distal anteroseptal wall appears mildly hypokinetic Increased gradients PV, possible supravalvular stenosis, max gradient 44mHg, mean 9 Trace PV regurgitation No pericardial effusion   TEE LVEF 50-55%, mild MR, mild PV stenosis Lipomatous hypertrophy of  intra-atrial spectrum Bubble study negative No evidence of significant pericardial effusion WBC 5.2 H/H 13/38 Plts 211 K+ 3.6 BUN/Creat 14/0.9 Mag 1.9 HS Trop <3, <3, <3 NTBNP <15  Assessment and Plan:  1. Syncope s/p Medtronic Loop recorder Normal device function See Pace Art report No changes today  2. Chest pain Prior work up as above, with Myoview suggesting infarct vs attenuation artifact *** CT cors  3. SVT Quiescent.   Current medicines are reviewed at length with the  patient today.   The patient {ACTIONS; HAS/DOES NOT HAVE:19233} concerns regarding his medicines.  The following changes were made today:  {NONE DEFAULTED:18576}  Labs/ tests ordered today include: *** No orders of the defined types were placed in this encounter.    Disposition:   Follow up with {EPMDS:28135}  in *** {Blank single:19197::"Months","Weeks"}    Signed, MSatira Mccallum  Chalmers Cater, PA-C  09/09/2022 1:08 PM  [place] [address] [phone] (office) [phone] (fax)

## 2022-09-10 ENCOUNTER — Encounter: Payer: Self-pay | Admitting: Student

## 2022-09-10 ENCOUNTER — Ambulatory Visit: Payer: Medicare Other | Attending: Student | Admitting: Student

## 2022-09-10 VITALS — BP 120/90 | HR 88 | Ht 71.0 in | Wt 177.2 lb

## 2022-09-10 DIAGNOSIS — R55 Syncope and collapse: Secondary | ICD-10-CM

## 2022-09-10 DIAGNOSIS — R079 Chest pain, unspecified: Secondary | ICD-10-CM | POA: Diagnosis not present

## 2022-09-10 DIAGNOSIS — I471 Supraventricular tachycardia: Secondary | ICD-10-CM

## 2022-09-10 MED ORDER — CARVEDILOL 6.25 MG PO TABS: 3.1250 mg | ORAL_TABLET | Freq: Two times a day (BID) | ORAL | 3 refills | Status: DC

## 2022-09-10 NOTE — Patient Instructions (Signed)
Medication Instructions:  Your physician has recommended you make the following change in your medication:   INCREASE: Carvedilol to 6.'25mg'$  twice daily  *If you need a refill on your cardiac medications before your next appointment, please call your pharmacy*   Lab Work: None If you have labs (blood work) drawn today and your tests are completely normal, you will receive your results only by: [place] (if you have MyChart) OR A paper copy in the mail If you have any lab test that is abnormal or we need to change your treatment, we will call you to review the results.   Follow-Up: At [facility], you and your health needs are our priority.  As part of our continuing mission to provide you with exceptional heart care, we have created designated Provider Care Teams.  These Care Teams include your primary Cardiologist (physician) and Advanced Practice Providers (APPs -  Physician Assistants and Nurse Practitioners) who all work together to provide you with the care you need, when you need it.   Your next appointment:   2-3 month(s) for loop implant consideration  The format for your next appointment:   In Person  Provider:   Allegra Lai, MD

## 2022-11-06 ENCOUNTER — Telehealth: Payer: Self-pay | Admitting: Cardiology

## 2022-11-06 NOTE — Telephone Encounter (Signed)
  Per MyChart scheduling message:  My heartbeat still more skipping and feel not good so please put pacemaker heart loop on me on this Monday,please. See you on this Monday

## 2022-11-06 NOTE — Telephone Encounter (Signed)
Dr. Curt Bears aware of pt's request. Will discuss w/ pt on Monday

## 2022-11-11 ENCOUNTER — Encounter: Payer: Self-pay | Admitting: *Deleted

## 2022-11-11 ENCOUNTER — Encounter (HOSPITAL_COMMUNITY): Payer: Self-pay

## 2022-11-11 ENCOUNTER — Encounter: Payer: Self-pay | Admitting: Cardiology

## 2022-11-11 ENCOUNTER — Ambulatory Visit: Payer: Medicare Other | Attending: Cardiology | Admitting: Cardiology

## 2022-11-11 VITALS — BP 110/78 | HR 78 | Ht 71.0 in | Wt 177.4 lb

## 2022-11-11 DIAGNOSIS — R079 Chest pain, unspecified: Secondary | ICD-10-CM | POA: Diagnosis not present

## 2022-11-11 MED ORDER — CARVEDILOL 6.25 MG PO TABS: 6.2500 mg | ORAL_TABLET | Freq: Two times a day (BID) | ORAL | 2 refills | Status: AC

## 2022-11-11 NOTE — Addendum Note (Signed)
Addended by: Stanton Kidney on: 11/11/2022 09:05 AM   Modules accepted: Orders

## 2022-11-11 NOTE — Patient Instructions (Addendum)
Medication Instructions:  Your physician has recommended you make the following change in your medication:  INCREASE your Carvedilol to 6.25 mg twice daily  *If you need a refill on your cardiac medications before your next appointment, please call your pharmacy*   Lab Work: None ordered   Testing/Procedures: Your physician has requested that you have cardiac CT. Cardiac computed tomography (CT) is a painless test that uses an x-ray machine to take clear, detailed pictures of your heart. For further information please visit [email or web address][facility][email or web address]. Please follow instruction sheet as given.  The office will call you to schedule this  Follow-Up: At [facility], you and your health needs are our priority.  As part of our continuing mission to provide you with exceptional heart care, we have created designated Provider Care Teams.  These Care Teams include your primary Cardiologist (physician) and Advanced Practice Providers (APPs -  Physician Assistants and Nurse Practitioners) who all work together to provide you with the care you need, when you need it.  Your next appointment:   6 month(s)  The format for your next appointment:   In Person  Provider:   Allegra Lai, MD    Thank you for choosing [facility]!!   Trinidad Curet, RN [phone]  Other Instructions  To find a primary doctor call:  [phone]  Important Information About [place]

## 2022-11-11 NOTE — Addendum Note (Signed)
Addended by: Stanton Kidney on: 11/11/2022 09:02 AM   Modules accepted: Orders

## 2022-11-11 NOTE — Progress Notes (Signed)
Electrophysiology Office Note   Date:  11/11/2022   ID:  WILGUS DEYTON, DOB 1961/01/07, MRN [id number]  PCP:  No primary care provider on file.  Cardiologist:  Ellyn Hack Primary Electrophysiologist:  Lasya Vetter Meredith Leeds, MD    No chief complaint on file.    History of Present Illness: EATON FOLMAR is a 61 y.o. male who is being seen today for the evaluation of syncope at the request of Maryan Char, NP. Presenting today for electrophysiology evaluation.    He has a history seen for hyperlipidemia, SVT, CVA.  He was on as needed flecainide.  He had been on flecainide since 2017.  He was hospitalized in 2018 with SVT.  Flecainide was increased.  He presented emergency room again with palpitations.  He took an extra flecainide with no benefit.  He is status post ILR implanted 02/26/2018.  ILR showed no further episodes.  He is now post explant.  Today, denies symptoms of shortness of breath, orthopnea, PND, lower extremity edema, claudication, dizziness, presyncope, syncope, bleeding, or neurologic sequela. The patient is tolerating medications without difficulties.  His main complaint today is chest pain and palpitations.  He presented to the emergency room in [place] over the summer after an episode of chest pain and near syncope.  He had an echo that showed a low normal ejection fraction and Myoview that was read as possible ischemia.  He is continued to have episodic palpitations.  He has pain that is at times worse with exertion, but also occurs at rest.  It lasts approximately 15 minutes and is a stabbing type pain.   Past Medical History:  Diagnosis Date   Allergy    Arthritis    right knee   Asthma    prn inhaler   Barrett's esophagus 10-25-14 pt denies   states "minor"   Chest pain    a. 06/2016 MV: EF 52%, no ischemia or infarct.   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    GERD (gastroesophageal  reflux disease)    Heartburn    occasional, related to certain foods   Hemoptysis 09/20/2018   High cholesterol    Intermittent palpitations 10-25-14 patient denies   a. Event Monitor 04/2014: Mostly NSR - intermittent PACs, - 2 brief runs of PAT/PSVT   PSVT (paroxysmal supraventricular tachycardia)    a. 04/2014 Event Monitor: brief PAT/PSVT-->uses prn flecainide.   Seasonal allergies    Stroke [facility]) 2013   TIA   TIA (transient ischemic attack) 10-25-14 pt denies   no current deficits   Vasovagal syncope    Past Surgical History:  Procedure Laterality Date   48 DAY CARDIAC EVENT MONITOR  08/2015   Ranging from mostly sinus rhythm to occasional sinus bradycardia and sinus tachycardia. Heart rate from 53 bpm to 129 bpm. No documented PVCs or PACs noted. No arrhythmia noted. No PSVT or A. Fib. "Heart racing" noted with sinus tachycardia.   Abdominal Aortic Ultrasound  2012   normal abdominal aorta   BUNIONECTOMY Left 2012   right done also   CARDIAC CATHETERIZATION  06/24/2011   wwidely patent normal coronary arteries with normal ejection fraction   Carotid Artery Dopplers  2012   tortuous but no stenoses.. No subclavian disease   COLONOSCOPY  2015   COLONOSCOPY WITH PROPOFOL N/A 11/03/2014   Procedure: COLONOSCOPY WITH PROPOFOL;  Surgeon: Milus Banister, MD;  Location: [facility];  Service: Endoscopy;  Laterality: N/A;   Foot Implant Removal Right 07/15/2013   @ [facility]   INGUINAL HERNIA REPAIR     KNEE ARTHROSCOPY Left 06/02/2012   KNEE ARTHROSCOPY Right fall 2013   x 2   KNEE ARTHROSCOPY Left 03/03/2013   Procedure: ARTHROSCOPY KNEE WITH CHONDROPLASTY PATELLA  AND LATERAL TIBIAL PLATEAU;  Surgeon: Kerin Salen, MD;  Location: [facility];  Service: Orthopedics;  Laterality: Left;   LOOP RECORDER INSERTION N/A 02/26/2018   Procedure: LOOP RECORDER INSERTION;  Surgeon: Constance Haw, MD;  Location: [facility];  Service: Cardiovascular;  Laterality: N/A;   NM  MYOVIEW LTD  06/2016   No evidence of ischemia or infarction.    TRANSTHORACIC ECHOCARDIOGRAM  June 2015, August 2018   A) normal EF: 55-60%. No wall motion abnormalities. Gr 1 DD.  Mild MR. Otherwise normal;; b) ([facility]) - Normal LV size and function.  EF 60-65%.  No significant valvular abnormalities.   Upper and Lower Extremity Arterial Dopplers  2012   normal arterial flow bilateral upper extremities including subclavian is a carotid;;R. ABI 1.2, L. ABI 1.28. Normal flow velocities. Likely calcified vessels.   UPPER GASTROINTESTINAL ENDOSCOPY  2013   VIDEO BRONCHOSCOPY Bilateral 09/30/2018   Procedure: VIDEO BRONCHOSCOPY WITH FLUORO;  Surgeon: Garner Nash, DO;  Location: [facility];  Service: Cardiopulmonary;  Laterality: Bilateral;     Current Outpatient Medications  Medication Sig Dispense Refill   albuterol (PROVENTIL) (2.5 MG/3ML) 0.083% nebulizer solution Take 3 mLs (2.5 mg total) by nebulization every 6 (six) hours as needed for wheezing or shortness of breath. 75 mL 5   budesonide (PULMICORT) 0.5 MG/2ML nebulizer solution Take 2 mLs (0.5 mg total) by nebulization 2 (two) times daily. 60 mL 5   budesonide-formoterol (SYMBICORT) 160-4.5 MCG/ACT inhaler Inhale 2 puffs into the lungs every 12 (twelve) hours. 2 Inhaler 0   fluticasone (FLONASE) 50 MCG/ACT nasal spray Place 2 sprays into both nostrils daily.     formoterol (PERFOROMIST) 20 MCG/2ML nebulizer solution Take 2 mLs (20 mcg total) by nebulization 2 (two) times daily. 60 mL 5   losartan (COZAAR) 25 MG tablet Take 25 mg by mouth daily.     nitroGLYCERIN (NITROSTAT) 0.4 MG SL tablet Place 1 tablet under the tongue every 5 (five) minutes x 3 doses as needed for chest pain.     omeprazole (PRILOSEC) 40 MG capsule TAKE ONE CAPSULE BY MOUTH DAILY 30 capsule 1   Spacer/Aero-Holding Chambers (AEROCHAMBER MV) inhaler Use as instructed 1 each 0   carvedilol (COREG) 6.25 MG tablet Take 1 tablet (6.25 mg total) by mouth 2 (two) times  daily. 180 tablet 2   No current facility-administered medications for this visit.    Allergies:   Prednisone, Shellfish-derived products, Shrimp [shellfish allergy], Adhesive [tape], Simvastatin, Tamsulosin, Simvastatin - high dose  [simvastatin-high dose], Fish allergy, Fish-derived products, and Isovue [iopamidol]   Social History:  The patient  reports that he has never smoked. He has never used smokeless tobacco. He reports that he does not drink alcohol and does not use drugs.   Family History:  The patient's family history includes Coronary artery disease in his father; Heart attack (age of onset: 10) in his brother; Heart attack (age of onset: 22) in his paternal grandfather; Heart attack (age of onset: 23) in his father; Ovarian cancer in his mother.   ROS:  Please see the history of present illness.   Otherwise, review of systems is positive for none.  All other systems are reviewed and negative.   PHYSICAL EXAM: VS:  BP 110/78   Pulse 78   Ht '5\' 11"'$  (1.803 m)   Wt 177 lb 6.4 oz (80.5 kg)   SpO2 92%   BMI 24.74 kg/m  , BMI Body mass index is 24.74 kg/m. GEN: Well nourished, well developed, in no acute distress  HEENT: normal  Neck: no JVD, carotid bruits, or masses Cardiac: RRR; no murmurs, rubs, or gallops,no edema  Respiratory:  clear to auscultation bilaterally, normal work of breathing GI: soft, nontender, nondistended, + BS MS: no deformity or atrophy  Skin: warm and dry Neuro:  Strength and sensation are intact Psych: euthymic mood, full affect  EKG:  EKG is ordered today. Personal review of the ekg ordered shows sinus rhythm, rate 78  Personal review of the device interrogation today. Results in [facility]: No results found for requested labs within last 365 days.    Lipid Panel     Component Value Date/Time   CHOL 166 07/10/2016 0158   TRIG 180 (H) 07/10/2016 0158   HDL 26 (L) 07/10/2016 0158   CHOLHDL 6.4 07/10/2016 0158   VLDL 36  07/10/2016 0158   LDLCALC 104 (H) 07/10/2016 0158     Wt Readings from Last 3 Encounters:  11/11/22 177 lb 6.4 oz (80.5 kg)  09/10/22 177 lb 3.2 oz (80.4 kg)  05/29/22 177 lb 12.8 oz (80.6 kg)      Other studies Reviewed: Additional studies/ records that were reviewed today include: TTE 08/2018  Review of the above records today demonstrates:  - Left ventricle: The cavity size was normal. Systolic function was   normal. The estimated ejection fraction was in the range of 55%   to 60%. There is hypokinesis of the apicalanteroseptal   myocardium. Doppler parameters are consistent with abnormal left   ventricular relaxation (grade 1 diastolic dysfunction). - Mitral valve: There was trivial regurgitation. - Tricuspid valve: There was mild regurgitation. - Pulmonic valve: There was trivial regurgitation.  Cardiac monitor 05/31/2022 personally reviewed Predominant rhythm was sinus rhythm 3 SVT episodes, fastest and longest 10 beats at 156 bpm  Less than 1% ventricular and supraventricular ectopy Triggered episodes associated with sinus rhythm and sinus tachycardia  ASSESSMENT AND PLAN:  1.  Syncope: Unclear as to the cause of his syncope.  He is worn multiple monitors and had an ILR implanted without major abnormality.  2.  PVCs: On no medications.  Low burden.  Continue with current management.  3.  SVT: On no antiarrhythmics.  No further episodes.  No changes at this time.  4.  Chest pain: Chest pain has both typical and atypical features.  He had some moderate to high risk features on a Myoview done in [place].  We Jaelie Aguilera order a coronary CT to further determine his cardiac anatomy.  Current medicines are reviewed at length with the patient today.   The patient does not have concerns regarding his medicines.  The following changes were made today: none  Labs/ tests ordered today include:  Orders Placed This Encounter  Procedures   CT CORONARY MORPH W/CTA COR W/SCORE W/CA W/CM  &/OR WO/CM   EKG 12-Lead     Disposition:   FU with 6 months  Signed, Shawnika Pepin Meredith Leeds, MD  11/11/2022 8:55 AM     [place] [address] [phone] (office) [phone] (fax)

## 2023-01-08 ENCOUNTER — Telehealth: Payer: Self-pay | Admitting: Cardiology

## 2023-01-08 NOTE — Telephone Encounter (Signed)
[facility] calls in for clearance for upcoming knee scope 1/22. Aware that pt is scheduled for CT on 1/18 and MD will not advise until seeing those results. Asked them to send [place] clearance request on letterhead, w/ specific procedure and requests.  Fax number given. Aware will let them know once CT is completed and reviewed.

## 2023-01-08 NOTE — Telephone Encounter (Signed)
Faxed last OV note via epic

## 2023-01-08 NOTE — Telephone Encounter (Signed)
Calling to get a copy of the patient last office visit notes.. Fax [phone]. Please advise

## 2023-01-14 ENCOUNTER — Other Ambulatory Visit (HOSPITAL_COMMUNITY): Payer: Self-pay | Admitting: *Deleted

## 2023-01-14 ENCOUNTER — Encounter (HOSPITAL_COMMUNITY): Payer: Self-pay

## 2023-01-14 MED ORDER — METOPROLOL TARTRATE 100 MG PO TABS: ORAL_TABLET | ORAL | 0 refills | Status: AC

## 2023-01-16 ENCOUNTER — Other Ambulatory Visit (HOSPITAL_COMMUNITY): Payer: Medicare Other

## 2023-01-16 ENCOUNTER — Ambulatory Visit (HOSPITAL_COMMUNITY)
Admission: RE | Admit: 2023-01-16 | Discharge: 2023-01-16 | Disposition: A | Discharge status: Home / Self Care | Payer: 59 | Source: Ambulatory Visit | Attending: Cardiology | Admitting: Cardiology

## 2023-01-16 DIAGNOSIS — R072 Precordial pain: Secondary | ICD-10-CM | POA: Diagnosis not present

## 2023-01-16 DIAGNOSIS — I2510 Atherosclerotic heart disease of native coronary artery without angina pectoris: Secondary | ICD-10-CM | POA: Insufficient documentation

## 2023-01-16 DIAGNOSIS — R079 Chest pain, unspecified: Secondary | ICD-10-CM

## 2023-01-16 DIAGNOSIS — I517 Cardiomegaly: Secondary | ICD-10-CM | POA: Insufficient documentation

## 2023-01-16 MED ORDER — METOPROLOL TARTRATE 5 MG/5ML IV SOLN
10.0000 mg | INTRAVENOUS | Status: AC | PRN
  Administered 2023-01-16: 10 mg via INTRAVENOUS

## 2023-01-16 MED ORDER — DILTIAZEM HCL 25 MG/5ML IV SOLN
INTRAVENOUS | Status: AC
  Filled 2023-01-16: qty 5

## 2023-01-16 MED ORDER — NITROGLYCERIN 0.4 MG SL SUBL
0.8000 mg | SUBLINGUAL_TABLET | Freq: Once | SUBLINGUAL | Status: AC
  Administered 2023-01-16: 0.8 mg via SUBLINGUAL

## 2023-01-16 MED ORDER — IOHEXOL 350 MG/ML SOLN
100.0000 mL | Freq: Once | INTRAVENOUS | Status: AC | PRN
  Administered 2023-01-16: 100 mL via INTRAVENOUS

## 2023-01-16 MED ORDER — METOPROLOL TARTRATE 5 MG/5ML IV SOLN
INTRAVENOUS | Status: AC
  Administered 2023-01-16: 10 mg via INTRAVENOUS
  Filled 2023-01-16: qty 20

## 2023-01-16 MED ORDER — NITROGLYCERIN 0.4 MG SL SUBL
SUBLINGUAL_TABLET | SUBLINGUAL | Status: AC
  Filled 2023-01-16: qty 2

## 2023-01-16 MED ORDER — DILTIAZEM HCL 25 MG/5ML IV SOLN
5.0000 mg | INTRAVENOUS | Status: DC | PRN
  Administered 2023-01-16: 5 mg via INTRAVENOUS

## 2023-01-20 ENCOUNTER — Telehealth: Payer: Self-pay | Admitting: Cardiology

## 2023-01-20 NOTE — Telephone Encounter (Signed)
   Pre-operative Risk Assessment    Patient Name: Nathaniel [name]  DOB: [birth year] MRN: [id number]      Request for Surgical Clearance    Procedure:   Knee scope  Date of Surgery:  Clearance 01/20/23                                 Surgeon:  Dr. Barnie Del Surgeon's Group or Practice Name:  [facility] Phone number:  [phone]  Fax number:  [phone]   Type of Clearance Requested:   - Medical    Type of Anesthesia:  General    Additional requests/questions:   Caller stated the will need medical clearance for this patient to have surgery today.  Signed, Heloise Beecham   01/20/2023, 9:18 AM

## 2023-01-20 NOTE — Telephone Encounter (Signed)
Faxed clearance over to [facility] at [phone].

## 2023-01-20 NOTE — Telephone Encounter (Signed)
Patient with coronary CTA that shows mild coronary artery disease, 25 to 49% stenosis.  Coronary calcium score of 99.  Patient has a normal echocardiogram.  Is planned for arthroscopic knee surgery.  Patient is at moderate risk for a moderate risk procedure.  No further cardiac testing is necessary at this time.  Candido Flott Curt Bears, MD.

## 2023-02-21 ENCOUNTER — Telehealth: Payer: Self-pay | Admitting: Cardiology

## 2023-02-21 NOTE — Telephone Encounter (Signed)
Responded to patient's concerns via MyChart message. See MyChart message for more details.

## 2023-02-21 NOTE — Telephone Encounter (Signed)
Pt c/o of Chest Pain: STAT if CP now or developed within 24 hours  1. Are you having CP right now?  2. Are you experiencing any other symptoms (ex. SOB, nausea, vomiting, sweating)?   3. How long have you been experiencing CP?   4. Is your CP continuous or coming and going?   5. Have you taken Nitroglycerin?  ?  Pt sent a message in Pt Schedule worried about his heart.  "Dr.Camnitz,  I have problem with my heart for one week, that it showed heart valves dismisses and more longer of heart attack sign as grabbed my hand on chest. Is my enlarge heart show warning sign of risky? Please inform me in immediately.  Mayer Camel"

## 2023-03-26 ENCOUNTER — Telehealth: Payer: Self-pay | Admitting: Cardiology

## 2023-03-26 NOTE — Telephone Encounter (Signed)
Pt c/o of Chest Pain: STAT if CP now or developed within 24 hours  1. Are you having CP right now? A little bit this morning but not now  2. Are you experiencing any other symptoms (ex. SOB, nausea, vomiting, sweating)? Right side having a cold paralyzed feeling   3. How long have you been experiencing CP? A while, could not specify  4. Is your CP continuous or coming and going? Comes and goes  5. Have you taken Nitroglycerin? yes ?   Patient states he has been having chest tightness for a while and was not able to specify how long. He says it will sometimes happen when he bends over and it lasts 20-30 min. He says he did have it again this morning, but does not have it now. He says the tightness comes and goes, but it also shoots up his neck and face. He says he also has been having a cold and paralyzing feeling on his right side. Interpreter that's she is not sure if it is a tingling feeling, because the patient would only say that he would feel paralyzed. He says he also gets it in his right hand and cannot write or move it. He says he is not about to do normal things with his hand. He says he also can bring his social security form to the office for [place] or Dr. Curt Bears to sign for him. He says he can be called back on his video phone: [phone], or respond through mychart if it is easier.

## 2023-03-27 NOTE — Telephone Encounter (Signed)
Late Entry: See mychart messages

## 2023-03-29 ENCOUNTER — Other Ambulatory Visit: Payer: Self-pay | Admitting: Physician Assistant

## 2023-03-31 ENCOUNTER — Telehealth: Payer: Self-pay | Admitting: Cardiology

## 2023-03-31 MED ORDER — NITROGLYCERIN 0.4 MG SL SUBL: 0.4000 mg | SUBLINGUAL_TABLET | SUBLINGUAL | 7 refills | Status: AC | PRN

## 2023-03-31 NOTE — Telephone Encounter (Signed)
Pt's medication was sent to pt's pharmacy as requested. Confirmation received.  °

## 2023-03-31 NOTE — Telephone Encounter (Signed)
 *  STAT* If patient is at the pharmacy, call can be transferred to refill team.   1. Which medications need to be refilled? (please list name of each medication and dose if known)   nitroGLYCERIN (NITROSTAT) 0.4 MG SL tablet   2. Which pharmacy/location (including street and city if local pharmacy) is medication to be sent to?  [facility] [id number] - [place], [place] [address]   3. Do they need a 30 day or 90 day supply?  [place]

## 2023-04-22 ENCOUNTER — Ambulatory Visit: Payer: Medicare PPO | Admitting: Physician Assistant

## 2023-04-25 ENCOUNTER — Ambulatory Visit: Payer: 59 | Admitting: Cardiology

## 2023-05-21 ENCOUNTER — Telehealth: Payer: Self-pay | Admitting: Cardiology

## 2023-05-21 NOTE — Telephone Encounter (Signed)
Pt called in with interpretor asking that we call his friend Wes McPhearson to sch an appt with Dr. Elberta Fortis or Luster Landsberg, PA since he will be his ride. Okay per DPR. Called Wes, [facility] not set up

## 2023-06-12 ENCOUNTER — Telehealth: Payer: Self-pay | Admitting: Cardiology

## 2023-06-12 NOTE — Telephone Encounter (Signed)
Sent MyChart message to patient informing him we received message from interpretor on his behalf. Informed patient that Dr. Elberta Fortis and his nurse are out of the office today and will address when Dr. Gershon Crane nurse returns to the office tomorrow.

## 2023-06-12 NOTE — Telephone Encounter (Signed)
Interpretor called in to speak on behalf of pt, stating he needs a letter for his disability to keep his social security form Dr. Elberta Fortis. He states Dr. Elberta Fortis should have the form to sign for the disability. Please advise.   Please send pt a mychart message, interpretor stated that is the only way he will be able to communicate.

## 2023-06-18 NOTE — Telephone Encounter (Signed)
Sent pt mychart message

## 2023-06-18 NOTE — Telephone Encounter (Signed)
Patient called to follow-up on his disability letter and states he will need this letter for his social security.  Patient states he is unable to drive very far as he falls asleep.  Patient states can call him and also respond through MyChart and will need a response soon.

## 2023-09-05 ENCOUNTER — Telehealth: Payer: Self-pay

## 2023-09-05 NOTE — Telephone Encounter (Signed)
   Patient Name: Nathaniel [name]  DOB: Feb 24, 1961 MRN: [id number]  Primary Cardiologist: Bryan Lemma, MD  Chart reviewed as part of pre-operative protocol coverage. Cataract extractions are recognized in guidelines as low risk surgeries that do not typically require specific preoperative testing or holding of blood thinner therapy. Therefore, given past medical history and time since last visit, based on ACC/AHA guidelines, Nathaniel [name] would be at acceptable risk for the planned procedure without further cardiovascular testing.   I will route this recommendation to the requesting party via Epic fax function and remove from pre-op pool.  Please call with questions.  Joni Reining, NP 09/05/2023, 10:47 AM

## 2023-09-05 NOTE — Telephone Encounter (Signed)
   Pre-operative Risk Assessment    Patient Name: Nathaniel [name]  DOB: 30-Mar-1961 MRN: [id number]   Last OV 11/11/22 with Dr. Elberta Fortis   Request for Surgical Clearance    Procedure:   Cataract Surgery  Date of Surgery:  Clearance 09/10/23                                 Surgeon:  Not indicated Surgeon's Group or Practice Name:  [facility] Phone number:  [phone] Fax number:  [phone]   Type of Clearance Requested:   - Medical    Type of Anesthesia:  MAC   Additional requests/questions:    Signed, Zada Finders   09/05/2023, 7:44 AM

## 2024-06-07 ENCOUNTER — Ambulatory Visit: Admitting: Cardiology

## 2024-07-15 ENCOUNTER — Telehealth: Payer: Self-pay | Admitting: Cardiology

## 2024-07-15 NOTE — Telephone Encounter (Signed)
   Pre-operative Risk Assessment    Patient Name: Nathaniel [name]  DOB: 1961/07/24 MRN: [id number]   Date of last office visit: 11/11/22 Date of next office visit: Not yet scheduled   Request for Surgical Clearance    Procedure:  Left knee orthoscopy  Date of Surgery:  Clearance 08/02/24                                Surgeon:  Dr. Lynwood Popp Surgeon's Group or Practice Name:  [facility] Phone number:  [phone]  Fax number:  [phone]   Type of Clearance Requested:   - Medical    Type of Anesthesia:  General    Additional requests/questions:  Caller Michail) noted patient will have 45 minutes of general anesthesia.  Signed, Jasmin B Wilson   07/15/2024, 12:45 PM

## 2024-07-15 NOTE — Telephone Encounter (Signed)
 Spoke with surgeons office and informed them that the patient has not been seen in our office since 2023 and most recent pt has been seen at [facility].   Told them that if they need anything else just to let our office know.

## 2024-10-26 NOTE — Nursing Note (Addendum)
 Patient given discharge instructions with video interpreter. Both verbalized understanding of teaching given. Questions answered. Pt given printed instructions.  Pt dressed. Pt discharged via wheelchair with wife and friend in NAD. All belongings with patient and friend.

## 2024-11-05 NOTE — Progress Notes (Signed)
 Scheduled for nurse nav call today and for onsite intake on 11/10/24 and Dr. Trudie consult on 11/02/24. After receiving his pathology report from his 10/26/24 colonoscopy - there is no evidence of cancer.  Explained to patient that without evidence of cancer, his evaluation at [facility] is canceled and he needs to follow up with Dr. Perri.  Patient gives verbal understanding.   Email to [facility] and schedulers to cancel appointments after his nurse nav call today.   Interpreter needed for NN assessment?  No - patient is deaf and uses assistive device on his phone.   Name: Nathaniel [name]  DOB: 21-Dec-1961  Confirm Email: [email or web address]  Patient's Contact #: [phone]  Is patient a physician referral? No  Are you bringing a caregiver to [facility]? No  Reason patient seeking care at [facility]: prior patient at [facility] - seeking treatment at [facility] - states his advocate team set him up to come to [facility]   Pertinent clinical details:  Last colonoscopy was 3 years ago  Saw Dr. Dempsey Perri for some abdominal pain (has had pain x 6 months) - had colonoscopy 10/26/24 - states Dr. Perri told him he has colon cancer  Has follow up appointment with Dr. Perri on 11/18/24.  Record Availability: No records available during NN call  Site of Diagnosis: Colon Diagnosis Date: October 2025   Current State of Diagnosis:  Newly diagnosed; No treatment  Initial Presentation at Diagnosis:  Abdominal pain:  Hematochezia:  Frequency with bowel movements  Current Symptoms & Treatment Related Side Effects:  Abdominal pain Hematochezia Frequency with bowel movements   Biopsy & Pathology Dates/Details:  10/26/24: Biopsy 1: colonoscopy with biopsy: Colon, sigmoid, 20 cm, polypectomy:  colonic mucosa with prominent lymphoid aggregate.

## 2024-11-08 ENCOUNTER — Ambulatory Visit: Attending: Student | Admitting: Student
